# Patient Record
Sex: Male | Born: 1945
Health system: Southern US, Community
[De-identification: ages and names within clinical notes are randomized; demographics above are authoritative.]

## PROBLEM LIST (undated history)

## (undated) DIAGNOSIS — I739 Peripheral vascular disease, unspecified: Secondary | ICD-10-CM

## (undated) DIAGNOSIS — N200 Calculus of kidney: Secondary | ICD-10-CM

## (undated) DIAGNOSIS — F329 Major depressive disorder, single episode, unspecified: Secondary | ICD-10-CM

## (undated) DIAGNOSIS — E119 Type 2 diabetes mellitus without complications: Secondary | ICD-10-CM

## (undated) DIAGNOSIS — I10 Essential (primary) hypertension: Secondary | ICD-10-CM

## (undated) DIAGNOSIS — E291 Testicular hypofunction: Secondary | ICD-10-CM

## (undated) DIAGNOSIS — N529 Male erectile dysfunction, unspecified: Secondary | ICD-10-CM

## (undated) DIAGNOSIS — M791 Myalgia, unspecified site: Secondary | ICD-10-CM

## (undated) DIAGNOSIS — M1990 Unspecified osteoarthritis, unspecified site: Secondary | ICD-10-CM

## (undated) DIAGNOSIS — J449 Chronic obstructive pulmonary disease, unspecified: Secondary | ICD-10-CM

## (undated) DIAGNOSIS — I2510 Atherosclerotic heart disease of native coronary artery without angina pectoris: Secondary | ICD-10-CM

## (undated) DIAGNOSIS — R0602 Shortness of breath: Secondary | ICD-10-CM

## (undated) DIAGNOSIS — Z951 Presence of aortocoronary bypass graft: Secondary | ICD-10-CM

## (undated) DIAGNOSIS — N189 Chronic kidney disease, unspecified: Secondary | ICD-10-CM

## (undated) DIAGNOSIS — F32A Depression, unspecified: Secondary | ICD-10-CM

## (undated) DIAGNOSIS — N400 Enlarged prostate without lower urinary tract symptoms: Secondary | ICD-10-CM

## (undated) DIAGNOSIS — E785 Hyperlipidemia, unspecified: Secondary | ICD-10-CM

## (undated) DIAGNOSIS — Z87442 Personal history of urinary calculi: Secondary | ICD-10-CM

## (undated) DIAGNOSIS — R531 Weakness: Secondary | ICD-10-CM

## (undated) DIAGNOSIS — I480 Paroxysmal atrial fibrillation: Secondary | ICD-10-CM

## (undated) DIAGNOSIS — I7409 Other arterial embolism and thrombosis of abdominal aorta: Secondary | ICD-10-CM

## (undated) DIAGNOSIS — R0789 Other chest pain: Secondary | ICD-10-CM

## (undated) HISTORY — DX: Type 2 diabetes mellitus without complications: E11.9

## (undated) HISTORY — DX: Other arterial embolism and thrombosis of abdominal aorta: I74.09

## (undated) HISTORY — PX: CYSTECTOMY: SUR359

## (undated) HISTORY — DX: Atherosclerotic heart disease of native coronary artery without angina pectoris: I25.10

## (undated) HISTORY — PX: BLEPHAROPLASTY: SUR158

## (undated) HISTORY — DX: Weakness: R53.1

## (undated) HISTORY — DX: Paroxysmal atrial fibrillation: I48.0

## (undated) HISTORY — DX: Benign prostatic hyperplasia without lower urinary tract symptoms: N40.0

## (undated) HISTORY — DX: Major depressive disorder, single episode, unspecified: F32.9

## (undated) HISTORY — DX: Testicular hypofunction: E29.1

## (undated) HISTORY — DX: Personal history of urinary calculi: Z87.442

## (undated) HISTORY — DX: Male erectile dysfunction, unspecified: N52.9

## (undated) HISTORY — PX: LITHOTRIPSY: SUR834

## (undated) HISTORY — DX: Myalgia, unspecified site: M79.10

## (undated) HISTORY — DX: Presence of aortocoronary bypass graft: Z95.1

## (undated) HISTORY — DX: Essential (primary) hypertension: I10

## (undated) HISTORY — DX: Depression, unspecified: F32.A

## (undated) HISTORY — DX: Hyperlipidemia, unspecified: E78.5

## (undated) HISTORY — DX: Unspecified osteoarthritis, unspecified site: M19.90

## (undated) HISTORY — DX: Shortness of breath: R06.02

## (undated) HISTORY — DX: Peripheral vascular disease, unspecified: I73.9

---

## 1973-07-16 HISTORY — PX: VASECTOMY: SHX75

## 1990-07-16 HISTORY — PX: CARDIAC CATHETERIZATION: SHX172

## 1992-07-16 HISTORY — PX: VENTRAL HERNIA REPAIR: SHX424

## 1998-05-30 ENCOUNTER — Emergency Department (HOSPITAL_COMMUNITY): Admission: EM | Admit: 1998-05-30 | Discharge: 1998-05-30 | Payer: Self-pay | Admitting: Emergency Medicine

## 1998-06-06 ENCOUNTER — Encounter: Payer: Self-pay | Admitting: Internal Medicine

## 1998-06-06 ENCOUNTER — Ambulatory Visit (HOSPITAL_COMMUNITY): Admission: RE | Admit: 1998-06-06 | Discharge: 1998-06-06 | Payer: Self-pay | Admitting: Internal Medicine

## 1998-07-16 HISTORY — PX: CHOLECYSTECTOMY: SHX55

## 2007-03-23 ENCOUNTER — Emergency Department: Payer: Self-pay | Admitting: Emergency Medicine

## 2007-04-10 ENCOUNTER — Ambulatory Visit: Payer: Self-pay | Admitting: Urology

## 2007-11-11 ENCOUNTER — Ambulatory Visit: Payer: Self-pay | Admitting: Gastroenterology

## 2008-02-19 ENCOUNTER — Ambulatory Visit: Payer: Self-pay | Admitting: Internal Medicine

## 2008-05-27 DIAGNOSIS — I70209 Unspecified atherosclerosis of native arteries of extremities, unspecified extremity: Secondary | ICD-10-CM | POA: Insufficient documentation

## 2008-05-27 DIAGNOSIS — I2089 Other forms of angina pectoris: Secondary | ICD-10-CM | POA: Insufficient documentation

## 2008-05-27 DIAGNOSIS — I208 Other forms of angina pectoris: Secondary | ICD-10-CM | POA: Insufficient documentation

## 2008-12-02 ENCOUNTER — Ambulatory Visit: Payer: Self-pay | Admitting: Urology

## 2008-12-14 HISTORY — PX: OTHER SURGICAL HISTORY: SHX169

## 2009-01-03 ENCOUNTER — Encounter: Payer: Self-pay | Admitting: Cardiovascular Disease

## 2009-01-07 ENCOUNTER — Ambulatory Visit: Payer: Self-pay | Admitting: Vascular Surgery

## 2009-11-14 ENCOUNTER — Encounter: Payer: Self-pay | Admitting: Internal Medicine

## 2009-12-17 ENCOUNTER — Observation Stay (HOSPITAL_COMMUNITY): Admission: EM | Admit: 2009-12-17 | Discharge: 2009-12-18 | Payer: Self-pay | Admitting: Emergency Medicine

## 2009-12-19 ENCOUNTER — Telehealth: Payer: Self-pay | Admitting: Internal Medicine

## 2010-05-04 ENCOUNTER — Ambulatory Visit: Payer: Self-pay | Admitting: Cardiovascular Disease

## 2010-07-20 ENCOUNTER — Ambulatory Visit: Payer: Self-pay | Admitting: Family Medicine

## 2010-08-16 ENCOUNTER — Encounter: Payer: Self-pay | Admitting: Orthopedic Surgery

## 2010-08-17 NOTE — Letter (Signed)
Summary: Records from [facility] 2007 - 2011  Records from [facility] 2007 - 2011   Imported By: Maryln Gottron 01/05/2010 09:36:12  _____________________________________________________________________  External Attachment:    Type:   Image     Comment:   External Document

## 2010-08-17 NOTE — Progress Notes (Signed)
Summary: new pt?  Phone Note Call from Patient   Caller: Patient Call For: Dr.K Summary of Call: Pt is asking Dr Kirtland Bouchard to take him as a new pt.  Was seen by Dr. Kirtland Bouchard in the ER this weekend? [phone] Initial call taken by: Lynann Beaver CMA,  December 19, 2009 4:28 PM  Follow-up for Phone Call        OK Follow-up by: Gordy Savers  MD,  December 19, 2009 5:18 PM  Additional Follow-up for Phone Call Additional follow up Details #1::        pt will cb to sch Additional Follow-up by: Heron Sabins,  December 20, 2009 9:08 AM

## 2010-10-02 LAB — CARDIAC PANEL(CRET KIN+CKTOT+MB+TROPI)
CK, MB: 0.7 ng/mL (ref 0.3–4.0)
CK, MB: 0.7 ng/mL (ref 0.3–4.0)
Relative Index: INVALID (ref 0.0–2.5)
Relative Index: INVALID (ref 0.0–2.5)
Total CK: 31 U/L (ref 7–232)
Total CK: 32 U/L (ref 7–232)
Troponin I: <0.01 ng/mL (ref 0.00–0.06)
Troponin I: <0.01 ng/mL (ref 0.00–0.06)

## 2010-10-02 LAB — URINALYSIS, ROUTINE W REFLEX MICROSCOPIC
Bilirubin Urine: NEGATIVE
Glucose, UA: NEGATIVE mg/dL
Hgb urine dipstick: NEGATIVE
Ketones, ur: NEGATIVE mg/dL
Nitrite: NEGATIVE
Protein, ur: NEGATIVE mg/dL
Specific Gravity, Urine: 1.023 (ref 1.005–1.030)
Urobilinogen, UA: 0.2 mg/dL (ref 0.0–1.0)
pH: 5.5 (ref 5.0–8.0)

## 2010-10-02 LAB — COMPREHENSIVE METABOLIC PANEL
ALT: 23 U/L (ref 0–53)
AST: 23 U/L (ref 0–37)
Albumin: 3.4 g/dL — ABNORMAL LOW (ref 3.5–5.2)
Alkaline Phosphatase: 50 U/L (ref 39–117)
BUN: 20 mg/dL (ref 6–23)
CO2: 25 mEq/L (ref 19–32)
Calcium: 9.1 mg/dL (ref 8.4–10.5)
Chloride: 109 mEq/L (ref 96–112)
Creatinine, Ser: 1.02 mg/dL (ref 0.4–1.5)
GFR calc Af Amer: >60 mL/min (ref >60)
GFR calc non Af Amer: >60 mL/min (ref >60)
Glucose, Bld: 201 mg/dL — ABNORMAL HIGH (ref 70–99)
Potassium: 4.1 mEq/L (ref 3.5–5.1)
Sodium: 140 mEq/L (ref 135–145)
Total Bilirubin: 0.6 mg/dL (ref 0.3–1.2)
Total Protein: 6.2 g/dL (ref 6.0–8.3)

## 2010-10-02 LAB — DIFFERENTIAL
Basophils Absolute: 0 10*3/uL (ref 0.0–0.1)
Basophils Relative: 0 % (ref 0–1)
Eosinophils Absolute: 0.1 10*3/uL (ref 0.0–0.7)
Eosinophils Relative: 1 % (ref 0–5)
Lymphocytes Relative: 15 % (ref 12–46)
Lymphs Abs: 1.3 10*3/uL (ref 0.7–4.0)
Monocytes Absolute: 1.1 10*3/uL — ABNORMAL HIGH (ref 0.1–1.0)
Monocytes Relative: 13 % — ABNORMAL HIGH (ref 3–12)
Neutro Abs: 6 10*3/uL (ref 1.7–7.7)
Neutrophils Relative %: 71 % (ref 43–77)

## 2010-10-02 LAB — GLUCOSE, CAPILLARY
Glucose-Capillary: 111 mg/dL — ABNORMAL HIGH (ref 70–99)
Glucose-Capillary: 168 mg/dL — ABNORMAL HIGH (ref 70–99)
Glucose-Capillary: 196 mg/dL — ABNORMAL HIGH (ref 70–99)
Glucose-Capillary: 205 mg/dL — ABNORMAL HIGH (ref 70–99)

## 2010-10-02 LAB — CBC
HCT: 40.2 % (ref 39.0–52.0)
Hemoglobin: 14 g/dL (ref 13.0–17.0)
MCHC: 34.9 g/dL (ref 30.0–36.0)
MCV: 97.9 fL (ref 78.0–100.0)
Platelets: 164 10*3/uL (ref 150–400)
RBC: 4.1 MIL/uL — ABNORMAL LOW (ref 4.22–5.81)
RDW: 11.8 % (ref 11.5–15.5)
WBC: 8.4 10*3/uL (ref 4.0–10.5)

## 2010-10-02 LAB — TROPONIN I: Troponin I: 0.02 ng/mL (ref 0.00–0.06)

## 2010-10-02 LAB — CK TOTAL AND CKMB (NOT AT ARMC)
CK, MB: 0.7 ng/mL (ref 0.3–4.0)
Relative Index: INVALID (ref 0.0–2.5)
Total CK: 33 U/L (ref 7–232)

## 2010-10-02 LAB — HEMOGLOBIN A1C
Hgb A1c MFr Bld: 6.2 % — ABNORMAL HIGH (ref <5.7)
Mean Plasma Glucose: 131 mg/dL — ABNORMAL HIGH (ref <117)

## 2010-10-02 LAB — TSH: TSH: 1.441 u[IU]/mL (ref 0.350–4.500)

## 2010-12-01 ENCOUNTER — Other Ambulatory Visit: Payer: Self-pay | Admitting: Cardiovascular Disease

## 2010-12-01 MED ORDER — ROSUVASTATIN CALCIUM 5 MG PO TABS: 5.0000 mg | ORAL_TABLET | Freq: Every day | ORAL | Status: DC

## 2010-12-01 MED ORDER — METOPROLOL SUCCINATE ER 25 MG PO TB24: 25.0000 mg | ORAL_TABLET | Freq: Every day | ORAL | Status: DC

## 2010-12-01 NOTE — Telephone Encounter (Signed)
Patient request refill. Jodette Hulet Ehrmann RN  

## 2010-12-01 NOTE — Telephone Encounter (Signed)
PATIENT IS MOVING TO [place] THE END OF June AND  NEEDS SCRIPTS CALLED [facility]-  METOPROLOL- 25MG  AND CRESTOR 5MG .

## 2011-01-26 ENCOUNTER — Telehealth: Payer: Self-pay | Admitting: Cardiovascular Disease

## 2011-01-26 NOTE — Telephone Encounter (Signed)
PT SAID NEW CARDIOLOGY OFFICE DID NOT RECV HIS RECORDS AS REQUESTED. ALTHOUGH THIS WAS DONE, ASSURED PT WE CAN REFAX AND HE GAVE A DIFFERENT FAX NUMBER OF [phone]. ALSO ASK FOR PHONE CALL BACK TO LET HIM KNOW WHEN THIS HAS BEEN DONE.

## 2011-01-26 NOTE — Telephone Encounter (Signed)
RECORDS TO DR GOODROE IN [place] AS REQUESTED BY PATIENT

## 2012-04-17 ENCOUNTER — Encounter: Payer: Self-pay | Admitting: Cardiovascular Disease

## 2012-12-16 LAB — HM COLONOSCOPY: HM Colonoscopy: NORMAL

## 2013-07-27 DIAGNOSIS — E291 Testicular hypofunction: Secondary | ICD-10-CM | POA: Diagnosis not present

## 2013-07-27 DIAGNOSIS — N200 Calculus of kidney: Secondary | ICD-10-CM | POA: Diagnosis not present

## 2013-07-27 DIAGNOSIS — Z5181 Encounter for therapeutic drug level monitoring: Secondary | ICD-10-CM | POA: Diagnosis not present

## 2013-08-14 DIAGNOSIS — H04129 Dry eye syndrome of unspecified lacrimal gland: Secondary | ICD-10-CM | POA: Diagnosis not present

## 2013-08-14 DIAGNOSIS — H259 Unspecified age-related cataract: Secondary | ICD-10-CM | POA: Diagnosis not present

## 2013-08-14 DIAGNOSIS — E119 Type 2 diabetes mellitus without complications: Secondary | ICD-10-CM | POA: Diagnosis not present

## 2013-08-18 DIAGNOSIS — E291 Testicular hypofunction: Secondary | ICD-10-CM | POA: Diagnosis not present

## 2013-09-08 DIAGNOSIS — E291 Testicular hypofunction: Secondary | ICD-10-CM | POA: Diagnosis not present

## 2013-09-09 DIAGNOSIS — M545 Low back pain, unspecified: Secondary | ICD-10-CM | POA: Diagnosis not present

## 2013-09-09 DIAGNOSIS — M5430 Sciatica, unspecified side: Secondary | ICD-10-CM | POA: Diagnosis not present

## 2013-09-29 DIAGNOSIS — E782 Mixed hyperlipidemia: Secondary | ICD-10-CM | POA: Diagnosis not present

## 2013-09-29 DIAGNOSIS — I10 Essential (primary) hypertension: Secondary | ICD-10-CM | POA: Diagnosis not present

## 2013-09-29 DIAGNOSIS — E291 Testicular hypofunction: Secondary | ICD-10-CM | POA: Diagnosis not present

## 2013-10-06 DIAGNOSIS — M25529 Pain in unspecified elbow: Secondary | ICD-10-CM | POA: Diagnosis not present

## 2013-10-06 DIAGNOSIS — E782 Mixed hyperlipidemia: Secondary | ICD-10-CM | POA: Diagnosis not present

## 2013-10-06 DIAGNOSIS — I10 Essential (primary) hypertension: Secondary | ICD-10-CM | POA: Diagnosis not present

## 2013-10-27 DIAGNOSIS — E291 Testicular hypofunction: Secondary | ICD-10-CM | POA: Diagnosis not present

## 2013-11-17 DIAGNOSIS — E291 Testicular hypofunction: Secondary | ICD-10-CM | POA: Diagnosis not present

## 2013-12-01 DIAGNOSIS — R002 Palpitations: Secondary | ICD-10-CM | POA: Diagnosis not present

## 2013-12-01 DIAGNOSIS — E785 Hyperlipidemia, unspecified: Secondary | ICD-10-CM | POA: Diagnosis not present

## 2013-12-01 DIAGNOSIS — I2510 Atherosclerotic heart disease of native coronary artery without angina pectoris: Secondary | ICD-10-CM | POA: Diagnosis not present

## 2013-12-01 DIAGNOSIS — I739 Peripheral vascular disease, unspecified: Secondary | ICD-10-CM | POA: Diagnosis not present

## 2013-12-01 DIAGNOSIS — I10 Essential (primary) hypertension: Secondary | ICD-10-CM | POA: Diagnosis not present

## 2013-12-08 DIAGNOSIS — E291 Testicular hypofunction: Secondary | ICD-10-CM | POA: Diagnosis not present

## 2013-12-14 DIAGNOSIS — E782 Mixed hyperlipidemia: Secondary | ICD-10-CM | POA: Diagnosis not present

## 2013-12-14 DIAGNOSIS — I10 Essential (primary) hypertension: Secondary | ICD-10-CM | POA: Diagnosis not present

## 2013-12-15 DIAGNOSIS — N200 Calculus of kidney: Secondary | ICD-10-CM | POA: Diagnosis not present

## 2013-12-15 DIAGNOSIS — M545 Low back pain, unspecified: Secondary | ICD-10-CM | POA: Diagnosis not present

## 2013-12-15 DIAGNOSIS — M5430 Sciatica, unspecified side: Secondary | ICD-10-CM | POA: Diagnosis not present

## 2013-12-16 DIAGNOSIS — N200 Calculus of kidney: Secondary | ICD-10-CM | POA: Diagnosis not present

## 2013-12-16 DIAGNOSIS — I700 Atherosclerosis of aorta: Secondary | ICD-10-CM | POA: Diagnosis not present

## 2013-12-16 DIAGNOSIS — M545 Low back pain, unspecified: Secondary | ICD-10-CM | POA: Diagnosis not present

## 2013-12-16 DIAGNOSIS — M47817 Spondylosis without myelopathy or radiculopathy, lumbosacral region: Secondary | ICD-10-CM | POA: Diagnosis not present

## 2013-12-16 DIAGNOSIS — M533 Sacrococcygeal disorders, not elsewhere classified: Secondary | ICD-10-CM | POA: Diagnosis not present

## 2013-12-16 DIAGNOSIS — M5137 Other intervertebral disc degeneration, lumbosacral region: Secondary | ICD-10-CM | POA: Diagnosis not present

## 2013-12-29 DIAGNOSIS — E291 Testicular hypofunction: Secondary | ICD-10-CM | POA: Diagnosis not present

## 2014-01-05 DIAGNOSIS — E291 Testicular hypofunction: Secondary | ICD-10-CM | POA: Diagnosis not present

## 2014-01-05 DIAGNOSIS — Z79899 Other long term (current) drug therapy: Secondary | ICD-10-CM | POA: Diagnosis not present

## 2014-01-05 DIAGNOSIS — R3129 Other microscopic hematuria: Secondary | ICD-10-CM | POA: Diagnosis not present

## 2014-01-21 DIAGNOSIS — M4716 Other spondylosis with myelopathy, lumbar region: Secondary | ICD-10-CM | POA: Diagnosis not present

## 2014-01-21 DIAGNOSIS — M5430 Sciatica, unspecified side: Secondary | ICD-10-CM | POA: Diagnosis not present

## 2014-01-26 DIAGNOSIS — M5126 Other intervertebral disc displacement, lumbar region: Secondary | ICD-10-CM | POA: Diagnosis not present

## 2014-01-26 DIAGNOSIS — M47817 Spondylosis without myelopathy or radiculopathy, lumbosacral region: Secondary | ICD-10-CM | POA: Diagnosis not present

## 2014-01-26 DIAGNOSIS — M48061 Spinal stenosis, lumbar region without neurogenic claudication: Secondary | ICD-10-CM | POA: Diagnosis not present

## 2014-01-27 DIAGNOSIS — E291 Testicular hypofunction: Secondary | ICD-10-CM | POA: Diagnosis not present

## 2014-01-27 DIAGNOSIS — N138 Other obstructive and reflux uropathy: Secondary | ICD-10-CM | POA: Diagnosis not present

## 2014-01-27 DIAGNOSIS — N401 Enlarged prostate with lower urinary tract symptoms: Secondary | ICD-10-CM | POA: Diagnosis not present

## 2014-01-27 DIAGNOSIS — Z87442 Personal history of urinary calculi: Secondary | ICD-10-CM | POA: Diagnosis not present

## 2014-02-12 DIAGNOSIS — H6120 Impacted cerumen, unspecified ear: Secondary | ICD-10-CM | POA: Diagnosis not present

## 2014-02-12 DIAGNOSIS — J029 Acute pharyngitis, unspecified: Secondary | ICD-10-CM | POA: Diagnosis not present

## 2014-02-12 DIAGNOSIS — H6690 Otitis media, unspecified, unspecified ear: Secondary | ICD-10-CM | POA: Diagnosis not present

## 2014-02-15 DIAGNOSIS — M48061 Spinal stenosis, lumbar region without neurogenic claudication: Secondary | ICD-10-CM | POA: Diagnosis not present

## 2014-02-15 DIAGNOSIS — M47817 Spondylosis without myelopathy or radiculopathy, lumbosacral region: Secondary | ICD-10-CM | POA: Diagnosis not present

## 2014-02-16 DIAGNOSIS — E291 Testicular hypofunction: Secondary | ICD-10-CM | POA: Diagnosis not present

## 2014-03-08 DIAGNOSIS — J069 Acute upper respiratory infection, unspecified: Secondary | ICD-10-CM | POA: Diagnosis not present

## 2014-03-08 DIAGNOSIS — R079 Chest pain, unspecified: Secondary | ICD-10-CM | POA: Diagnosis not present

## 2014-03-08 DIAGNOSIS — R0789 Other chest pain: Secondary | ICD-10-CM | POA: Diagnosis not present

## 2014-03-08 DIAGNOSIS — M25529 Pain in unspecified elbow: Secondary | ICD-10-CM | POA: Diagnosis not present

## 2014-03-09 DIAGNOSIS — E291 Testicular hypofunction: Secondary | ICD-10-CM | POA: Diagnosis not present

## 2014-04-13 DIAGNOSIS — R002 Palpitations: Secondary | ICD-10-CM | POA: Diagnosis not present

## 2014-04-13 DIAGNOSIS — E291 Testicular hypofunction: Secondary | ICD-10-CM | POA: Diagnosis not present

## 2014-04-13 DIAGNOSIS — I10 Essential (primary) hypertension: Secondary | ICD-10-CM | POA: Diagnosis not present

## 2014-04-13 DIAGNOSIS — I739 Peripheral vascular disease, unspecified: Secondary | ICD-10-CM | POA: Diagnosis not present

## 2014-04-13 DIAGNOSIS — E785 Hyperlipidemia, unspecified: Secondary | ICD-10-CM | POA: Diagnosis not present

## 2014-04-13 DIAGNOSIS — I2510 Atherosclerotic heart disease of native coronary artery without angina pectoris: Secondary | ICD-10-CM | POA: Diagnosis not present

## 2014-05-05 DIAGNOSIS — R351 Nocturia: Secondary | ICD-10-CM | POA: Diagnosis not present

## 2014-05-05 DIAGNOSIS — N401 Enlarged prostate with lower urinary tract symptoms: Secondary | ICD-10-CM | POA: Diagnosis not present

## 2014-05-06 DIAGNOSIS — I10 Essential (primary) hypertension: Secondary | ICD-10-CM | POA: Diagnosis not present

## 2014-05-06 DIAGNOSIS — E782 Mixed hyperlipidemia: Secondary | ICD-10-CM | POA: Diagnosis not present

## 2014-05-13 DIAGNOSIS — N400 Enlarged prostate without lower urinary tract symptoms: Secondary | ICD-10-CM | POA: Diagnosis not present

## 2014-05-13 DIAGNOSIS — E782 Mixed hyperlipidemia: Secondary | ICD-10-CM | POA: Diagnosis not present

## 2014-05-13 DIAGNOSIS — M1990 Unspecified osteoarthritis, unspecified site: Secondary | ICD-10-CM | POA: Diagnosis not present

## 2014-05-13 DIAGNOSIS — I10 Essential (primary) hypertension: Secondary | ICD-10-CM | POA: Diagnosis not present

## 2014-07-05 DIAGNOSIS — N3281 Overactive bladder: Secondary | ICD-10-CM | POA: Diagnosis not present

## 2014-07-05 DIAGNOSIS — N401 Enlarged prostate with lower urinary tract symptoms: Secondary | ICD-10-CM | POA: Diagnosis not present

## 2014-07-27 DIAGNOSIS — B354 Tinea corporis: Secondary | ICD-10-CM | POA: Diagnosis not present

## 2014-07-27 DIAGNOSIS — L723 Sebaceous cyst: Secondary | ICD-10-CM | POA: Diagnosis not present

## 2014-08-05 DIAGNOSIS — L723 Sebaceous cyst: Secondary | ICD-10-CM | POA: Diagnosis not present

## 2014-08-05 DIAGNOSIS — N3281 Overactive bladder: Secondary | ICD-10-CM | POA: Diagnosis not present

## 2014-08-05 DIAGNOSIS — N401 Enlarged prostate with lower urinary tract symptoms: Secondary | ICD-10-CM | POA: Diagnosis not present

## 2014-08-19 DIAGNOSIS — Z125 Encounter for screening for malignant neoplasm of prostate: Secondary | ICD-10-CM | POA: Diagnosis not present

## 2014-08-19 DIAGNOSIS — E782 Mixed hyperlipidemia: Secondary | ICD-10-CM | POA: Diagnosis not present

## 2014-08-24 DIAGNOSIS — R51 Headache: Secondary | ICD-10-CM | POA: Diagnosis not present

## 2014-08-24 DIAGNOSIS — H6980 Other specified disorders of Eustachian tube, unspecified ear: Secondary | ICD-10-CM | POA: Diagnosis not present

## 2014-08-24 DIAGNOSIS — J019 Acute sinusitis, unspecified: Secondary | ICD-10-CM | POA: Diagnosis not present

## 2014-09-08 DIAGNOSIS — L723 Sebaceous cyst: Secondary | ICD-10-CM | POA: Diagnosis not present

## 2014-09-08 DIAGNOSIS — N3281 Overactive bladder: Secondary | ICD-10-CM | POA: Diagnosis not present

## 2014-09-08 DIAGNOSIS — N401 Enlarged prostate with lower urinary tract symptoms: Secondary | ICD-10-CM | POA: Diagnosis not present

## 2014-09-27 ENCOUNTER — Telehealth: Payer: Self-pay | Admitting: Internal Medicine

## 2014-09-27 NOTE — Telephone Encounter (Signed)
Pt called stating is he moving back to [place] and wanted to know if you would take him back as a patient Medicare humana supplement

## 2014-09-27 NOTE — Telephone Encounter (Signed)
That is fine 

## 2014-11-15 ENCOUNTER — Ambulatory Visit: Payer: Self-pay | Admitting: Internal Medicine

## 2014-12-01 ENCOUNTER — Encounter: Payer: Self-pay | Admitting: *Deleted

## 2014-12-02 ENCOUNTER — Ambulatory Visit (INDEPENDENT_AMBULATORY_CARE_PROVIDER_SITE_OTHER): Payer: Medicare Other | Admitting: Cardiovascular Disease

## 2014-12-02 ENCOUNTER — Encounter: Payer: Self-pay | Admitting: Cardiovascular Disease

## 2014-12-02 ENCOUNTER — Encounter (INDEPENDENT_AMBULATORY_CARE_PROVIDER_SITE_OTHER): Payer: Self-pay

## 2014-12-02 VITALS — BP 120/64 | HR 68 | Ht 68.0 in | Wt 201.8 lb

## 2014-12-02 DIAGNOSIS — I2510 Atherosclerotic heart disease of native coronary artery without angina pectoris: Secondary | ICD-10-CM | POA: Diagnosis not present

## 2014-12-02 DIAGNOSIS — I700 Atherosclerosis of aorta: Secondary | ICD-10-CM

## 2014-12-02 DIAGNOSIS — I7410 Embolism and thrombosis of unspecified parts of aorta: Secondary | ICD-10-CM

## 2014-12-02 DIAGNOSIS — I10 Essential (primary) hypertension: Secondary | ICD-10-CM

## 2014-12-02 DIAGNOSIS — Q253 Supravalvular aortic stenosis: Secondary | ICD-10-CM | POA: Diagnosis not present

## 2014-12-02 DIAGNOSIS — I739 Peripheral vascular disease, unspecified: Secondary | ICD-10-CM

## 2014-12-02 DIAGNOSIS — R0602 Shortness of breath: Secondary | ICD-10-CM | POA: Diagnosis not present

## 2014-12-02 NOTE — Patient Instructions (Signed)
Medication Instructions:  No changes  Labwork: None  Testing/Procedures: None  Follow-Up: Your physician wants you to follow-up in: 1 year  You will receive a reminder letter in the mail two months in advance.  If you don't receive a letter, please call our office to schedule the follow-up appointment.   Any Other Special Instructions Will Be Listed Below (If Applicable). A referral has been sent to Dr. Hart RochesterLawson at [facility] They will contact you with an appointment

## 2014-12-02 NOTE — Progress Notes (Signed)
Cardiology Office Note   Date:  12/02/2014   ID:  [name], DOB 09-18-1945, MRN [id number]  PCP:  [name]  Cardiologist:   [name]   Chief Complaint  Patient presents with  . other    Self ref to establish care for PVD & CAD; was seen in the past in [place] office, [place] back [place] [facility]. Meds reviewed by the patient's medication list. Pt. c/o difficulty walking a long distance.    Problem List: 1. CAD -had a cath in the remote past 2.  Peripheral Arterial Disease -  Reportedly has an occluded distal aorta , has seen Dr. Jerilee FieldJ.D. [name] in the past   3. Essential Hypertensioni 4. Hyperlipidemia 5. Borderline  DM 6. ED 7. BPH      History of Present Illness: [name] is a 69 y.o. male who presents for follow-up of his coronary artery disease and peripheral vascular disease. I have Seen [name]  approximately 5 years ago. He's recently moved back to the [place] area and wishes to reestablish care.  Is [name] former neighbor.   [place] [facility] [place], now is back. Reportedly has an occluded distal aorta.  Has some DOE with walking. Legs feel cold  Had a cath in the remote past, reportedly has  Minimal CAD   Past Medical History  Diagnosis Date  . Coronary artery disease   . Peripheral vascular disease   . Hyperlipidemia   . Diabetes mellitus   . SOB (shortness of breath)   . Muscle ache   . Muscle pain   . Ischemic leg   . Weakness   . Erectile dysfunction   . Hypertension     Past Surgical History  Procedure Laterality Date  . Cardiac catheterization  1992     Current Outpatient Prescriptions  Medication Sig Dispense Refill  . acetaminophen (TYLENOL) 650 MG CR tablet Take 650 mg by mouth every 8 (eight) hours as needed.    . clopidogrel (PLAVIX) 75 MG tablet Take 75 mg by mouth daily.    . finasteride (PROSCAR) 5 MG tablet Take 5 mg by mouth daily.    Marland Kitchen. gabapentin (NEURONTIN) 400 MG  capsule Take 400 mg by mouth 3 (three) times daily.    . metoprolol succinate (TOPROL XL) 25 MG 24 hr tablet Take 1 tablet (25 mg total) by mouth daily. 30 tablet 11  . mirabegron ER (MYRBETRIQ) 25 MG TB24 tablet Take 25 mg by mouth daily.    . Multiple Vitamin (MULTIVITAMIN) tablet Take 1 tablet by mouth daily.    . naproxen sodium (ANAPROX) 220 MG tablet Take 220 mg by mouth 2 (two) times daily with a meal.    . simvastatin (ZOCOR) 20 MG tablet Take 20 mg by mouth daily.     No current facility-administered medications for this visit.    Allergies:   Fenofibrate and Lipitor    Social History:  The patient  reports that he quit smoking about 22 years ago. His smoking use included Cigarettes. He has a 80 pack-year smoking history. He does not have any smokeless tobacco history on file. He reports that he does not drink alcohol or use illicit drugs.   Family History:  The patient's family history includes Coronary artery disease in his father.    ROS:  Please see the history of present illness.    Review of Systems: Constitutional:  denies fever, chills, diaphoresis, appetite change and  fatigue.  HEENT: denies photophobia, eye pain, redness, hearing loss, ear pain, congestion, sore throat, rhinorrhea, sneezing, neck pain, neck stiffness and tinnitus.  Respiratory: denies SOB, DOE, cough, chest tightness, and wheezing.  Cardiovascular: denies chest pain, palpitations and leg swelling.  Gastrointestinal: denies nausea, vomiting, abdominal pain, diarrhea, constipation, blood in stool.  Genitourinary: denies dysuria, urgency, frequency, hematuria, flank pain and difficulty urinating.  Musculoskeletal: denies  myalgias, back pain, joint swelling, arthralgias and gait problem.   Skin: denies pallor, rash and wound.  Neurological: denies dizziness, seizures, syncope, weakness, light-headedness, numbness and headaches.   Hematological: denies adenopathy, easy bruising, personal or family  bleeding history.  Psychiatric/ Behavioral: denies suicidal ideation, mood changes, confusion, nervousness, sleep disturbance and agitation.       All other systems are reviewed and negative.    PHYSICAL EXAM: VS:  BP 120/64 mmHg  Pulse 68  Ht 5\' 8"  (1.727 m)  Wt 91.513 kg (201 lb 12 oz)  BMI 30.68 kg/m2 , BMI Body mass index is 30.68 kg/(m^2). GEN: Well nourished, well developed, in no acute distress HEENT: normal Neck: no JVD, carotid bruits, or masses Cardiac: RRR; no murmurs, rubs, or gallops,no edema , his distal leg pulses are not palpable  Respiratory:  clear to auscultation bilaterally, normal work of breathing GI: soft, nontender, nondistended, + BS, + abdominal bruit.  MS: no deformity or atrophy Skin: warm and dry, no rash Neuro:  Strength and sensation are intact Psych: normal   EKG:  EKG is ordered today. The ekg ordered today demonstrates :  NSR at 68.  Short PR. Otherwise normal .    Recent Labs: No results found for requested labs within last 365 days.    Lipid Panel No results found for: CHOL, TRIG, HDL, CHOLHDL, VLDL, LDLCALC, LDLDIRECT    Wt Readings from Last 3 Encounters:  12/02/14 91.513 kg (201 lb 12 oz)      Other studies Reviewed: Additional studies/ records that were reviewed today include: . Review of the above records demonstrates:    ASSESSMENT AND PLAN:  1. CAD -had a cath in the remote past, has minimal coronary artery disease by his report. He's not having any angina. He had a negative stress test in [place]  [facility] in [place]. Continue current medications.  2.  Peripheral Arterial Disease -  Reportedly has an occluded distal aorta , has seen Dr. Jerilee FieldJ.D. [name] in the past .  We will will refer him back to [facility] for further evaluation and duplex scanning. 3. Essential Hypertension - BP is well controlled.  4. Hyperlipidemia - he wants to have this managed by Dr. Dan HumphreysWalker. Continue simvastatin. 5. Borderline  DM 6. ED 7. BPH   Current  medicines are reviewed at length with the patient today.  The patient does not have concerns regarding medicines.  The following changes have been made:  no change  Labs/ tests ordered today include:   Orders Placed This Encounter  Procedures  . EKG 12-Lead     Disposition:   FU with me in 1 year      Shanard Treto, Deloris PingPhilip J, [name]  12/02/2014 9:09 AM    [facility] [address], [place], [place]  [address] Phone: [phone]) [phone]; Fax: [phone]   [facility]  [address] [place], [place]  [address] [phone]    Fax [phone]

## 2014-12-07 ENCOUNTER — Other Ambulatory Visit: Payer: Self-pay

## 2014-12-07 ENCOUNTER — Other Ambulatory Visit: Payer: Self-pay | Admitting: *Deleted

## 2014-12-07 MED ORDER — CLOPIDOGREL BISULFATE 75 MG PO TABS: 75.0000 mg | ORAL_TABLET | Freq: Every day | ORAL | Status: DC

## 2014-12-07 MED ORDER — METOPROLOL SUCCINATE ER 25 MG PO TB24: 25.0000 mg | ORAL_TABLET | Freq: Every day | ORAL | Status: DC

## 2014-12-07 NOTE — Telephone Encounter (Signed)
Refill sent for metoprolol.  

## 2014-12-10 ENCOUNTER — Other Ambulatory Visit: Payer: Self-pay

## 2014-12-10 MED ORDER — METOPROLOL SUCCINATE ER 25 MG PO TB24: 25.0000 mg | ORAL_TABLET | Freq: Every day | ORAL | Status: DC

## 2014-12-10 NOTE — Telephone Encounter (Signed)
Refill sent for metoprolol succ 25 mg  

## 2014-12-17 ENCOUNTER — Encounter: Payer: Self-pay | Admitting: Internal Medicine

## 2014-12-17 ENCOUNTER — Ambulatory Visit (INDEPENDENT_AMBULATORY_CARE_PROVIDER_SITE_OTHER): Payer: Medicare Other | Admitting: Internal Medicine

## 2014-12-17 VITALS — BP 93/58 | HR 85 | Temp 97.9°F | Ht 68.25 in | Wt 203.0 lb

## 2014-12-17 DIAGNOSIS — N400 Enlarged prostate without lower urinary tract symptoms: Secondary | ICD-10-CM

## 2014-12-17 DIAGNOSIS — L299 Pruritus, unspecified: Secondary | ICD-10-CM | POA: Diagnosis not present

## 2014-12-17 DIAGNOSIS — I700 Atherosclerosis of aorta: Secondary | ICD-10-CM | POA: Insufficient documentation

## 2014-12-17 DIAGNOSIS — R413 Other amnesia: Secondary | ICD-10-CM | POA: Diagnosis not present

## 2014-12-17 DIAGNOSIS — I739 Peripheral vascular disease, unspecified: Secondary | ICD-10-CM

## 2014-12-17 DIAGNOSIS — M545 Low back pain, unspecified: Secondary | ICD-10-CM | POA: Insufficient documentation

## 2014-12-17 DIAGNOSIS — I10 Essential (primary) hypertension: Secondary | ICD-10-CM

## 2014-12-17 DIAGNOSIS — Z23 Encounter for immunization: Secondary | ICD-10-CM | POA: Insufficient documentation

## 2014-12-17 DIAGNOSIS — I2510 Atherosclerotic heart disease of native coronary artery without angina pectoris: Secondary | ICD-10-CM | POA: Diagnosis not present

## 2014-12-17 LAB — COMPREHENSIVE METABOLIC PANEL
ALT: 20 U/L (ref 0–53)
AST: 16 U/L (ref 0–37)
Albumin: 3.9 g/dL (ref 3.5–5.2)
Alkaline Phosphatase: 49 U/L (ref 39–117)
BUN: 24 mg/dL — ABNORMAL HIGH (ref 6–23)
CO2: 27 mEq/L (ref 19–32)
Calcium: 9.2 mg/dL (ref 8.4–10.5)
Chloride: 103 mEq/L (ref 96–112)
Creatinine, Ser: 1.08 mg/dL (ref 0.40–1.50)
GFR: 71.75 mL/min (ref >60.00)
Glucose, Bld: 183 mg/dL — ABNORMAL HIGH (ref 70–99)
Potassium: 4 mEq/L (ref 3.5–5.1)
Sodium: 136 mEq/L (ref 135–145)
Total Bilirubin: 0.4 mg/dL (ref 0.2–1.2)
Total Protein: 6.6 g/dL (ref 6.0–8.3)

## 2014-12-17 LAB — TSH: TSH: 1.84 u[IU]/mL (ref 0.35–4.50)

## 2014-12-17 LAB — VITAMIN B12: Vitamin B-12: 507 pg/mL (ref 211–911)

## 2014-12-17 MED ORDER — SIMVASTATIN 20 MG PO TABS: 20.0000 mg | ORAL_TABLET | Freq: Every day | ORAL | Status: DC

## 2014-12-17 MED ORDER — GABAPENTIN 400 MG PO CAPS: 400.0000 mg | ORAL_CAPSULE | Freq: Three times a day (TID) | ORAL | Status: DC

## 2014-12-17 MED ORDER — METOPROLOL SUCCINATE ER 25 MG PO TB24: 25.0000 mg | ORAL_TABLET | Freq: Every day | ORAL | Status: DC

## 2014-12-17 MED ORDER — FINASTERIDE 5 MG PO TABS: 5.0000 mg | ORAL_TABLET | Freq: Every day | ORAL | Status: DC

## 2014-12-17 MED ORDER — CLOPIDOGREL BISULFATE 75 MG PO TABS: 75.0000 mg | ORAL_TABLET | Freq: Every day | ORAL | Status: DC

## 2014-12-17 NOTE — Assessment & Plan Note (Signed)
Urology evaluation pending. Stopping Myrbetriq because of pruritis.

## 2014-12-17 NOTE — Assessment & Plan Note (Signed)
BP Readings from Last 3 Encounters:  12/17/14 93/58  12/02/14 120/64   BP well controlled. Will check renal function with labs.

## 2014-12-17 NOTE — Assessment & Plan Note (Signed)
Recent pruritis after starting Myrbetriq. Recommended stopping medication and discussing with Urology at follow up scheduled.

## 2014-12-17 NOTE — Assessment & Plan Note (Signed)
Some dyspnea and left arm pain with exertion. Recent evaluation with Dr. Elease HashimotoNahser. Will request previous stress test. Continue statin, metoprolol, plavix.

## 2014-12-17 NOTE — Assessment & Plan Note (Signed)
Chronic lumbago. Will request MRI lumbar spine report. Continue prn Aleve. Will set up PT for evaluation.

## 2014-12-17 NOTE — Patient Instructions (Signed)
Labs today.  We will set up physical therapy.  Follow up 3 months.

## 2014-12-17 NOTE — Assessment & Plan Note (Signed)
Vascular evaluation pending. Continue statin, Plavix. 

## 2014-12-17 NOTE — Assessment & Plan Note (Signed)
Vascular evaluation pending. Continue statin, Plavix.

## 2014-12-17 NOTE — Progress Notes (Signed)
Subjective:    Patient ID: [name], male    DOB: [birth year], 69 y.o.   MRN: [id number]  HPI  68YO male presents to establish care.  Itching - Over back of head, legs, and ears. Ongoing for a few months. Started when he started [employer].  BPH - Changed from [place] to Myrbetriq. Has frequent urination some nights, up to 2-3 times per night. Some nights, sleeps through whole night. Avoids caffeine.   Dyspnea - With exertion and at rest.  Associated with left upper arm pain. No diaphoresis. No chest pain. Had a recent Holter monitor in [facility]. Also had stress test 2 years ago which was normal. Recent evaluation with Dr. Elease Hashimoto.  Low back pain - Chronic for years. MRI  Lumbar spine was normal per report. Sharp pain, mostly with walking. Aleve with minimal improvement. No previous PT.   Past medical, surgical, family and social history per today's encounter.  Review of Systems  Constitutional: Negative for fever, chills, activity change, appetite change, fatigue and unexpected weight change.  Eyes: Negative for visual disturbance.  Respiratory: Positive for shortness of breath. Negative for cough.   Cardiovascular: Negative for chest pain, palpitations and leg swelling.  Gastrointestinal: Negative for nausea, vomiting, abdominal pain, diarrhea, constipation and abdominal distention.  Genitourinary: Negative for dysuria, urgency, frequency, hematuria, decreased urine volume and difficulty urinating.  Musculoskeletal: Positive for myalgias, back pain and arthralgias. Negative for gait problem.  Skin: Negative for color change and rash.  Hematological: Negative for adenopathy.  Psychiatric/Behavioral: Negative for suicidal ideas, sleep disturbance and dysphoric mood. The patient is not nervous/anxious.        Objective:    BP 93/58 mmHg  Pulse 85  Temp(Src) 97.9 F (36.6 C) (Oral)  Ht 5' 8.25" (1.734 m)  Wt 203 lb (92.08 kg)  BMI 30.62 kg/m2  SpO2 95% Physical Exam    Constitutional: He is oriented to person, place, and time. He appears well-developed and well-nourished. No distress.  HENT:  Head: Normocephalic and atraumatic.  Right Ear: External ear normal.  Left Ear: External ear normal.  Nose: Nose normal.  Mouth/Throat: Oropharynx is clear and moist. No oropharyngeal exudate.  Eyes: Conjunctivae and EOM are normal. Pupils are equal, round, and reactive to light. Right eye exhibits no discharge. Left eye exhibits no discharge. No scleral icterus.  Neck: Normal range of motion. Neck supple. No tracheal deviation present. No thyromegaly present.  Cardiovascular: Normal rate, regular rhythm and normal heart sounds.  Exam reveals no gallop and no friction rub.   No murmur heard. Pulmonary/Chest: Effort normal and breath sounds normal. No respiratory distress. He has no wheezes. He has no rales. He exhibits no tenderness.  Abdominal: Soft. Bowel sounds are normal. He exhibits no distension and no mass. There is no tenderness. There is no rebound and no guarding.  Musculoskeletal: Normal range of motion. He exhibits no edema.  Lymphadenopathy:    He has no cervical adenopathy.  Neurological: He is alert and oriented to person, place, and time. No cranial nerve deficit. Coordination normal.  Skin: Skin is warm and dry. No rash noted. He is not diaphoretic. No erythema. No pallor.  Psychiatric: He has a normal mood and affect. His behavior is normal. Judgment and thought content normal.          Assessment & Plan:   Problem List Items Addressed This Visit      Unprioritized   Aortic atherosclerosis    Vascular evaluation pending. Continue statin, Plavix.  Relevant Medications   metoprolol succinate (TOPROL XL) 25 MG 24 hr tablet   simvastatin (ZOCOR) 20 MG tablet   BPH (benign prostatic hypertrophy) - Primary    Urology evaluation pending. Stopping Myrbetriq because of pruritis.      Relevant Medications   finasteride (PROSCAR) 5 MG  tablet   Other Relevant Orders   Ambulatory referral to Urology   CAD (coronary artery disease)    Some dyspnea and left arm pain with exertion. Recent evaluation with Dr. Elease HashimotoNahser. Will request previous stress test. Continue statin, metoprolol, plavix.      Relevant Medications   metoprolol succinate (TOPROL XL) 25 MG 24 hr tablet   simvastatin (ZOCOR) 20 MG tablet   HTN (hypertension)    BP Readings from Last 3 Encounters:  12/17/14 93/58  12/02/14 120/64   BP well controlled. Will check renal function with labs.      Relevant Medications   metoprolol succinate (TOPROL XL) 25 MG 24 hr tablet   simvastatin (ZOCOR) 20 MG tablet   Lumbago    Chronic lumbago. Will request MRI lumbar spine report. Continue prn Aleve. Will set up PT for evaluation.      Relevant Orders   Ambulatory referral to Physical Therapy   Memory loss    Pt notes mild short term memory loss. Will check TSH and B12 with labs. Exam is normal today. Discussed referral for more in-depth cognitive testing, however will hold off for now.      Relevant Orders   B12   RESOLVED: Need for vaccination with 13-polyvalent pneumococcal conjugate vaccine   Relevant Orders   Pneumococcal conjugate vaccine 13-valent (Completed)   PAD (peripheral artery disease)    Vascular evaluation pending. Continue statin, Plavix.      Relevant Medications   metoprolol succinate (TOPROL XL) 25 MG 24 hr tablet   clopidogrel (PLAVIX) 75 MG tablet   simvastatin (ZOCOR) 20 MG tablet   gabapentin (NEURONTIN) 400 MG capsule   Pruritus    Recent pruritis after starting Myrbetriq. Recommended stopping medication and discussing with Urology at follow up scheduled.       Relevant Orders   TSH   Comprehensive metabolic panel       Return in about 3 months (around 03/19/2015) for Recheck.

## 2014-12-17 NOTE — Progress Notes (Signed)
Pre visit review using our clinic review tool, if applicable. No additional management support is needed unless otherwise documented below in the visit note. 

## 2014-12-17 NOTE — Assessment & Plan Note (Signed)
Pt notes mild short term memory loss. Will check TSH and B12 with labs. Exam is normal today. Discussed referral for more in-depth cognitive testing, however will hold off for now.

## 2014-12-20 ENCOUNTER — Encounter: Payer: Self-pay | Admitting: Internal Medicine

## 2014-12-21 ENCOUNTER — Other Ambulatory Visit: Payer: Self-pay

## 2014-12-21 DIAGNOSIS — I10 Essential (primary) hypertension: Secondary | ICD-10-CM

## 2014-12-21 MED ORDER — METOPROLOL SUCCINATE ER 25 MG PO TB24: 25.0000 mg | ORAL_TABLET | Freq: Every day | ORAL | Status: DC

## 2014-12-21 NOTE — Telephone Encounter (Signed)
Refill sent for metoprolol succ 

## 2014-12-30 ENCOUNTER — Other Ambulatory Visit: Payer: Self-pay | Admitting: *Deleted

## 2014-12-30 DIAGNOSIS — I739 Peripheral vascular disease, unspecified: Secondary | ICD-10-CM

## 2014-12-31 ENCOUNTER — Ambulatory Visit (INDEPENDENT_AMBULATORY_CARE_PROVIDER_SITE_OTHER): Payer: Medicare Other | Admitting: Urology

## 2014-12-31 ENCOUNTER — Encounter: Payer: Self-pay | Admitting: Urology

## 2014-12-31 ENCOUNTER — Other Ambulatory Visit: Payer: Self-pay | Admitting: *Deleted

## 2014-12-31 VITALS — BP 97/62 | HR 83 | Ht 69.0 in | Wt 200.5 lb

## 2014-12-31 DIAGNOSIS — N528 Other male erectile dysfunction: Secondary | ICD-10-CM

## 2014-12-31 DIAGNOSIS — R350 Frequency of micturition: Secondary | ICD-10-CM | POA: Diagnosis not present

## 2014-12-31 DIAGNOSIS — N200 Calculus of kidney: Secondary | ICD-10-CM

## 2014-12-31 DIAGNOSIS — N529 Male erectile dysfunction, unspecified: Secondary | ICD-10-CM

## 2014-12-31 DIAGNOSIS — I739 Peripheral vascular disease, unspecified: Secondary | ICD-10-CM

## 2014-12-31 DIAGNOSIS — Q251 Coarctation of aorta: Secondary | ICD-10-CM

## 2014-12-31 DIAGNOSIS — N400 Enlarged prostate without lower urinary tract symptoms: Secondary | ICD-10-CM | POA: Diagnosis not present

## 2014-12-31 DIAGNOSIS — L299 Pruritus, unspecified: Secondary | ICD-10-CM

## 2014-12-31 LAB — MICROSCOPIC EXAMINATION: Bacteria, UA: NONE SEEN

## 2014-12-31 LAB — URINALYSIS, COMPLETE
Bilirubin, UA: NEGATIVE
Glucose, UA: NEGATIVE
Ketones, UA: NEGATIVE
Leukocytes, UA: NEGATIVE
Nitrite, UA: NEGATIVE
Protein, UA: NEGATIVE
RBC, UA: NEGATIVE
Specific Gravity, UA: 1.015 (ref 1.005–1.030)
Urobilinogen, Ur: 0.2 mg/dL (ref 0.2–1.0)
pH, UA: 5 (ref 5.0–7.5)

## 2014-12-31 LAB — BLADDER SCAN AMB NON-IMAGING: Scan Result: 69

## 2014-12-31 NOTE — Progress Notes (Signed)
12/31/2014 3:05 PM   [name] 18-Nov-1945 [id number]  Referring provider: Shelia Media, MD [address] [place], [place] [address]  Chief Complaint  Patient presents with  . Establish Care    urinary frequency, hypogonadism, scrotal itching    HPI: 69 yo M with BPH, Pyronnies diease, ED, urinary frequency and scrotal itching.   He and his wife recently moved back to the area after living at [facility] for 4 years.  He was followed by a Urologist there and will obtain records for review.   Today's history provided by the patient.   He has severe urinary symptoms with urinary frequency, mild urgency, and nocturia x 3.  He does does have a fairly decent urinary stream, does feel that he is able to empty his bladder without straining but will then void shortly thereafter.  He does have some post void dribbling. He denies incontinence.  He has tried alpha blocker (Rapaflow) which didn't help much.  He was most recently on Mybetriq 25 mg which but does not think it helped much and since stopping this medication, his severe scrotal itching has improved.  He does drink mostly water and occasional decaf coffee.  He avoids drinking too much due to his irritative voiding symptoms.   He underwent injections for Pyronies which did help improve his curvature.  He does have severe baseline and has tried Cialis 5 mg daily, Viagra and Levitra which failed.  He has never tried ICI or VED.    He also has a history of kidney stones s/p EWSL x 2.  He most recently passed a stone 4 years ago.  He denies any recently flank pain.  No gross hematuria.  No recent imaging.  He does snore.  He denies history of a sleep study.    He also has history of hypogonadism on injections for symptoms of fatique,low T.  He did not see much benefits with injections.    He has been dx with borderline diabetes several years ago  but has not had any recent HbgA1c of which he is aware.  IPSS  Questionnaire (AUA-7): Over the past month.   1)  How often have you had a sensation of not emptying your bladder completely after you finish urinating?  1 - Less than 1 time in 5  2)  How often have you had to urinate again less than two hours after you finished urinating? 5 - Almost always  3)  How often have you found you stopped and started again several times when you urinated?  0 - Not at all  4) How difficult have you found it to postpone urination?  0 - Not at all  5) How often have you had a weak urinary stream?  0 - Not at all  6) How often have you had to push or strain to begin urination?  0 - Not at all  7) How many times did you most typically get up to urinate from the time you went to bed until the time you got up in the morning?  1 - 1 time  Total score:  0-7 mildly symptomatic   8-19 moderately symptomatic   20-35 severely symptomatic    Bother score: 6    PMH: Past Medical History  Diagnosis Date  . Coronary artery disease   . Peripheral vascular disease   . Hyperlipidemia   . Diabetes mellitus   . SOB (shortness of breath)   . Muscle ache   .  Muscle pain   . Ischemic leg   . Weakness   . Erectile dysfunction   . Hypertension   . Hypogonadism in male   . Enlarged prostate   . Arthritis   . Depression   . History of kidney stones     Surgical History: Past Surgical History  Procedure Laterality Date  . Cardiac catheterization  1992  . Ventral hernia repair  1994    complicated by infection  . Cholecystectomy  2000    also had revision of hernia repair    Home Medications:    Medication List       This list is accurate as of: 12/31/14 11:59 PM.  Always use your most recent med list.               acetaminophen 650 MG CR tablet  Commonly known as:  TYLENOL  Take 650 mg by mouth every 8 (eight) hours as needed.     clopidogrel 75 MG tablet  Commonly known as:  PLAVIX  Take 1 tablet (75 mg total) by mouth daily.     finasteride 5 MG  tablet  Commonly known as:  PROSCAR  Take 1 tablet (5 mg total) by mouth daily.     gabapentin 400 MG capsule  Commonly known as:  NEURONTIN  Take 1 capsule (400 mg total) by mouth 3 (three) times daily.     metoprolol succinate 25 MG 24 hr tablet  Commonly known as:  TOPROL XL  Take 1 tablet (25 mg total) by mouth daily.     mirabegron ER 25 MG Tb24 tablet  Commonly known as:  MYRBETRIQ  Take 25 mg by mouth daily.     multivitamin tablet  Take 1 tablet by mouth daily.     naproxen sodium 220 MG tablet  Commonly known as:  ANAPROX  Take 220 mg by mouth 2 (two) times daily with a meal.     simvastatin 20 MG tablet  Commonly known as:  ZOCOR  Take 1 tablet (20 mg total) by mouth daily.        Allergies:  Allergies  Allergen Reactions  . Fenofibrate   . Lipitor [Atorvastatin Calcium]     Family History: Family History  Problem Relation Age of Onset  . Coronary artery disease Father   . Heart disease Father   . Stroke Mother   . Stroke Sister   . Cancer Neg Hx     KIdney, Bladder, Prostate    Social History:  reports that he quit smoking about 22 years ago. His smoking use included Cigarettes. He has a 80 pack-year smoking history. He does not have any smokeless tobacco history on file. He reports that he drinks alcohol. He reports that he does not use illicit drugs.  ROS: Urological Symptom Review  Patient is experiencing the following symptoms: Frequent urination Hard to postpone urination Get up at night to urinate Leakage of urine Stream starts and stops Weak stream  Erectile problems  Review of Systems  Gastrointestinal (upper)  : Indigestion/heartburn  Gastrointestinal (lower) : Constipation  Constitutional : Fatigue  Skin: Itching  Eyes: Blurred vision  Ear/Nose/Throat : Negative for Ear/Nose/Throat symptoms  Hematologic/Lymphatic: Easy bruising  Cardiovascular : Leg swelling  Respiratory : Shortness of  breath  Endocrine: Negative for endocrine symptoms  Musculoskeletal: Back pain Joint pain  Neurological: Negative for neurological symptoms  Psychologic: Negative for psychiatric symptoms   Physical Exam: BP 97/62 mmHg  Pulse 83  Ht 5\' 9"  (  1.753 m)  Wt 200 lb 8 oz (90.946 kg)  BMI 29.60 kg/m2  Constitutional:  Alert and oriented, No acute distress. HEENT: Western AT, moist mucus membranes.  Trachea midline, no masses. Cardiovascular: No clubbing, cyanosis, or edema. Respiratory: Normal respiratory effort, no increased work of breathing. GI: Abdomen is soft, nontender, nondistended, no abdominal masses GU: No CVA tenderness. 40 cc gland, rubbery, no nodules.  Circumcised phallus with normal orthotopic meatus.  Scrotum with bilateral descended testicles, no masses, no tender.  No significant scrotal rash or excoriation. Skin: No rashes, bruises or suspicious lesions. Lymph: No cervical or inguinal adenopathy. Neurologic: Grossly intact, no focal deficits, moving all 4 extremities. Psychiatric: Normal mood and affect.  Laboratory Data: Lab Results  Component Value Date   WBC 8.4 12/17/2009   HGB 14.0 12/17/2009   HCT 40.2 12/17/2009   MCV 97.9 12/17/2009   PLT 164 12/17/2009    Lab Results  Component Value Date   CREATININE 1.08 12/17/2014    No results found for: PSA  No results found for: TESTOSTERONE  Lab Results  Component Value Date   HGBA1C * 12/18/2009    6.2 (NOTE)                                                                       According to the ADA Clinical Practice Recommendations for 2011, when HbA1c is used as a screening test:   >=6.5%   Diagnostic of Diabetes Mellitus           (if abnormal result  is confirmed)  5.7-6.4%   Increased risk of developing Diabetes Mellitus  References:Diagnosis and Classification of Diabetes Mellitus,Diabetes Care,2011,34(Suppl 1):S62-S69 and Standards of Medical Care in         Diabetes - 2011,Diabetes Care,2011,34   (Suppl 1):S11-S61.    Urinalysis Results for orders placed or performed in visit on 12/31/14  Microscopic Examination  Result Value Ref Range   WBC, UA 0-5 0 -  5 /hpf   RBC, UA 0-2 0 -  2 /hpf   Epithelial Cells (non renal) 0-10 0 - 10 /hpf   Bacteria, UA None seen None seen/Few  Urinalysis, Complete  Result Value Ref Range   Specific Gravity, UA 1.015 1.005 - 1.030   pH, UA 5.0 5.0 - 7.5   Color, UA Yellow Yellow   Appearance Ur Clear Clear   Leukocytes, UA Negative Negative   Protein, UA Negative Negative/Trace   Glucose, UA Negative Negative   Ketones, UA Negative Negative   RBC, UA Negative Negative   Bilirubin, UA Negative Negative   Urobilinogen, Ur 0.2 0.2 - 1.0 mg/dL   Nitrite, UA Negative Negative   Microscopic Examination See below:   PSA  Result Value Ref Range   Prostate Specific Ag, Serum 0.6 0.0 - 4.0 ng/mL  BLADDER SCAN AMB NON-IMAGING  Result Value Ref Range   Scan Result 69     Assessment & Plan:  69 yo M with multiple GU issues.  1. Urinary frequency Refractory irritative voiding symptoms.  He has presumably failed multiple anticholenergic before being started on most recently Myrbetriq.  He does appear to avoid irritating beverages along with other behavoural contributing factors.  Will need to review records to assess  for h/o cystoscopy to r/o CIS.  PVR today minimal.  Today, we discussed various possible contributing factors such as possible underlying untreated DM or OSA.  I have recommended f/u with his PCP to explore these.  We also discussed non pharmacological treatments today given his refractory symptoms including PTNS, Interstim and botox.  He is somewhat interested in PNTS and will arrange.  We discussed the procedure at length today including the treatment course and typical outcomes.   - BLADDER SCAN AMB NON-IMAGING - Urinalysis, Complete  2. BPH (benign prostatic hyperplasia) Slightly enlarged gland on DRE.  Will check PSA screening  today. - BLADDER SCAN AMB NON-IMAGING - Urinalysis, Complete - PSA  3. Pruritus, primarily scrotal ? Etiology.  Improving since stopping mybetriq.  No scrotal pathology identified on exam today.  4. Erectile dysfunction of organic origin + h/o Peyronie's  Refractory ED to PDEis, no interested in further intervention at this time.  5. Kidney stones Personal history of kidney stones, no recent flank pain. Will continue to monitor.   Schedule PTNS once previous Urologist records reviewed.    I spent >45 min of which more than 50% of the time was used coordinating/ counseling.   Vanna Scotland, MD  [facility] [address] [place], [place] [address] [phone]

## 2015-01-01 LAB — PSA: Prostate Specific Ag, Serum: 0.6 ng/mL (ref 0.0–4.0)

## 2015-01-02 ENCOUNTER — Encounter: Payer: Self-pay | Admitting: Urology

## 2015-01-03 ENCOUNTER — Telehealth: Payer: Self-pay

## 2015-01-03 NOTE — Telephone Encounter (Signed)
-----   Message from Harle Battiest, PA-C sent at 01/02/2015  8:58 PM EDT ----- PSA is normal. Dr. Apolinar Junes wants his records from his previous urologist prior to scheduling PTNS.

## 2015-01-03 NOTE — Telephone Encounter (Signed)
Spoke with pt. Pt stated his PCP Dr. Dan Humphreys is going to fax over previous medical records. Pt stated he saw Dr. Jamison Oka [phone] in [place] if we dont get records.

## 2015-01-04 DIAGNOSIS — M545 Low back pain: Secondary | ICD-10-CM | POA: Diagnosis not present

## 2015-01-04 DIAGNOSIS — M5136 Other intervertebral disc degeneration, lumbar region: Secondary | ICD-10-CM | POA: Diagnosis not present

## 2015-01-06 DIAGNOSIS — M5136 Other intervertebral disc degeneration, lumbar region: Secondary | ICD-10-CM | POA: Diagnosis not present

## 2015-01-06 DIAGNOSIS — M545 Low back pain: Secondary | ICD-10-CM | POA: Diagnosis not present

## 2015-01-07 ENCOUNTER — Encounter: Payer: Self-pay | Admitting: Vascular Surgery

## 2015-01-11 ENCOUNTER — Encounter: Payer: Self-pay | Admitting: Internal Medicine

## 2015-01-11 ENCOUNTER — Encounter: Payer: Self-pay | Admitting: Vascular Surgery

## 2015-01-11 ENCOUNTER — Ambulatory Visit (INDEPENDENT_AMBULATORY_CARE_PROVIDER_SITE_OTHER): Payer: Medicare Other | Admitting: Vascular Surgery

## 2015-01-11 ENCOUNTER — Ambulatory Visit (HOSPITAL_COMMUNITY)
Admission: RE | Admit: 2015-01-11 | Discharge: 2015-01-11 | Disposition: A | Discharge status: Home / Self Care | Payer: Medicare Other | Source: Ambulatory Visit | Attending: Vascular Surgery | Admitting: Vascular Surgery

## 2015-01-11 ENCOUNTER — Other Ambulatory Visit: Payer: Self-pay | Admitting: Vascular Surgery

## 2015-01-11 VITALS — BP 113/73 | HR 69 | Resp 16 | Ht 70.0 in | Wt 196.0 lb

## 2015-01-11 DIAGNOSIS — I739 Peripheral vascular disease, unspecified: Secondary | ICD-10-CM

## 2015-01-11 DIAGNOSIS — I2510 Atherosclerotic heart disease of native coronary artery without angina pectoris: Secondary | ICD-10-CM

## 2015-01-11 NOTE — Progress Notes (Signed)
Subjective:     Patient ID: [name], male   DOB: [birth year], 69 y.o.   MRN: [id number]  HPI This 69 year old male is referred by Dr.  Delane GingerPhil Nahser  For evaluation of lower extremity occlusive disease. Apparently I have seen this patient in the past but we do not have those records available in our office. The patient has had stable claudication symptoms for many years and both lower extremities right worse than left. He was told back in the 1990s that he had an occluded aorta. I'm not certain what test was done to confirm this. He did have an attempt at an angiogram or cardiac catheterization via the right groin on one occasion and that was unsuccessful and the test was then done via left brachial approach. He denies rest pain, nonhealing ulcers, infection, gangrene, rest pain, or numbness in the feet. He is able to ambulate about one quarter of mile at which time he stops because of leg discomfort and shortness of breath. He does not think the symptoms have worsened over the last several months.  Past Medical History  Diagnosis Date  . Coronary artery disease   . Peripheral vascular disease   . Hyperlipidemia   . Diabetes mellitus   . SOB (shortness of breath)   . Muscle ache   . Muscle pain   . Ischemic leg   . Weakness   . Erectile dysfunction   . Hypertension   . Hypogonadism in male   . Enlarged prostate   . Arthritis   . Depression   . History of kidney stones     History  Substance Use Topics  . Smoking status: Former Smoker -- 2.00 packs/day for 40 years    Types: Cigarettes    Quit date: 10/17/1992  . Smokeless tobacco: Not on file     Comment: quit 1994  . Alcohol Use: 0.0 oz/week    0 Standard drinks or equivalent per week     Comment: seldom    Family History  Problem Relation Age of Onset  . Coronary artery disease Father   . Heart disease Father   . Stroke Mother   . Stroke Sister   . Cancer Neg Hx     KIdney, Bladder, Prostate    Allergies  Allergen  Reactions  . Fenofibrate   . Lipitor [Atorvastatin Calcium]      Current outpatient prescriptions:  .  acetaminophen (TYLENOL) 650 MG CR tablet, Take 650 mg by mouth every 8 (eight) hours as needed., Disp: , Rfl:  .  clopidogrel (PLAVIX) 75 MG tablet, Take 1 tablet (75 mg total) by mouth daily., Disp: 90 tablet, Rfl: 3 .  finasteride (PROSCAR) 5 MG tablet, Take 1 tablet (5 mg total) by mouth daily., Disp: 90 tablet, Rfl: 3 .  gabapentin (NEURONTIN) 400 MG capsule, Take 1 capsule (400 mg total) by mouth 3 (three) times daily., Disp: 270 capsule, Rfl: 3 .  metoprolol succinate (TOPROL XL) 25 MG 24 hr tablet, Take 1 tablet (25 mg total) by mouth daily., Disp: 90 tablet, Rfl: 3 .  Multiple Vitamin (MULTIVITAMIN) tablet, Take 1 tablet by mouth daily., Disp: , Rfl:  .  naproxen sodium (ANAPROX) 220 MG tablet, Take 220 mg by mouth 2 (two) times daily with a meal., Disp: , Rfl:  .  simvastatin (ZOCOR) 20 MG tablet, Take 1 tablet (20 mg total) by mouth daily., Disp: 90 tablet, Rfl: 3 .  mirabegron ER (MYRBETRIQ) 25 MG TB24 tablet, Take 25 mg by  mouth daily., Disp: , Rfl:   Filed Vitals:   01/11/15 1248  BP: 113/73  Pulse: 69  Resp: 16  Height:  (1.778 m)  Weight: 196 lb (88.905 kg)    Body mass index is 28.12 kg/(m^2).           Review of Systems Denies chest pain ,  Productive cough, hemoptysis, patient does have dyspnea on exertion and leg pain with ambulation as noted above. Other systems negative and a complete review of systems     Objective:   Physical Exam BP 113/73 mmHg  Pulse 69  Resp 16  Ht  (1.778 m)  Wt 196 lb (88.905 kg)  BMI 28.12 kg/m2  Gen.-alert and oriented x3 in no apparent distress HEENT normal for age Lungs no rhonchi or wheezing Cardiovascular regular rhythm no murmurs carotid pulses 3+ palpable no bruits audible Abdomen soft nontender no palpable masses Musculoskeletal free of  major deformities Skin clear -no rashes Neurologic  normal Lower extremities  Absent right femoral and distal pulses 2+ left femoral pulse with no distal pulses palpable.  both feet adequately perfused with no evidence  Of infection, cellulitis, gangrene.  Today I ordered lower extremity ABIs are reviewed and interpreted. There is monophasic flow in both feet with ABI of 0.81 in the right and 0.80 on the left.       Assessment:      #1 probable aortoiliac occlusive disease and possible bilateral superficial femoral occlusive disease with stable claudication.  #2 doubt occlusion of aorta with 2+ left femoral pulse palpable. Suspect he does have bilateral iliac occlusive disease #3 patient is limited as much as by dyspnea on exertion as by his claudication which does not seem to be limiting his activities at the present time and is not limb threatening     #4 remote history of tobacco abuse -quit in 1994 Plan:      would not recommend any further vascular workup at this time. If patient develops worsening claudication which is quite limiting or obviously if he develops a limb threatening problem then we should proceed with further evaluation. I discussed this with him and he understands an agrees   patient will continue with his Plavix which he is currently taking

## 2015-01-13 DIAGNOSIS — M5136 Other intervertebral disc degeneration, lumbar region: Secondary | ICD-10-CM | POA: Diagnosis not present

## 2015-01-13 DIAGNOSIS — M545 Low back pain: Secondary | ICD-10-CM | POA: Diagnosis not present

## 2015-01-31 ENCOUNTER — Telehealth: Payer: Self-pay | Admitting: Internal Medicine

## 2015-01-31 NOTE — Telephone Encounter (Signed)
Received mychart message requesting Tetanus/Tdap and Zostavax. Please advise if okay to schedule.msn

## 2015-02-01 NOTE — Telephone Encounter (Signed)
I would recommend getting Zostavax at a local pharmacy as it is generally much less expensive there. I would also recommend going to the health dept for the Tdap as it is generally not covered by Medicare

## 2015-02-01 NOTE — Telephone Encounter (Signed)
Notified pt. 

## 2015-02-01 NOTE — Telephone Encounter (Signed)
Please see below request  

## 2015-02-04 ENCOUNTER — Telehealth: Payer: Self-pay | Admitting: Urology

## 2015-02-04 NOTE — Telephone Encounter (Signed)
[name] called to make sure the paperwork his wife dropped off 1-2 weeks ago was received. He said the paperwork were his medical records from Dr. Theodoro Parma office at [facility]. He'd discussed possibly having a procedure with Dr. Apolinar Junes and she needed information from Dr. Tilman Neat office. [name] wants to confirm the paperwork is enough information for Dr. Apolinar Junes to decide if they should move forward with the treatment that was discussed. He'd like a phone call so he'll know if he needs to have more information sent to Dr. Apolinar Junes or if what she has will suffice. Pt ph# [phone] Thank you.  Note: I spoke with Dr. Apolinar Junes and she confirmed she has the paperwork on this patient but she's not had a chance to review the information. I called [name] to make him aware of this.

## 2015-02-24 ENCOUNTER — Encounter: Payer: Self-pay | Admitting: Internal Medicine

## 2015-02-24 ENCOUNTER — Ambulatory Visit (INDEPENDENT_AMBULATORY_CARE_PROVIDER_SITE_OTHER): Payer: Medicare Other | Admitting: Internal Medicine

## 2015-02-24 VITALS — BP 118/60 | HR 73 | Temp 98.0°F | Resp 18 | Ht 70.0 in | Wt 206.6 lb

## 2015-02-24 DIAGNOSIS — L309 Dermatitis, unspecified: Secondary | ICD-10-CM | POA: Diagnosis not present

## 2015-02-24 DIAGNOSIS — I2510 Atherosclerotic heart disease of native coronary artery without angina pectoris: Secondary | ICD-10-CM | POA: Diagnosis not present

## 2015-02-24 MED ORDER — SELENIUM SULFIDE 2.25 % EX SHAM: 1.0000 "application " | MEDICATED_SHAMPOO | Freq: Every day | CUTANEOUS | Status: DC

## 2015-02-24 MED ORDER — CLOBETASOL PROPIONATE 0.05 % EX OINT: 1.0000 "application " | TOPICAL_OINTMENT | Freq: Two times a day (BID) | CUTANEOUS | Status: DC

## 2015-02-24 NOTE — Progress Notes (Signed)
Subjective:    Patient ID: [name], male    DOB: 09-21-1945, 69 y.o.   MRN: [id number]  HPI  68YO male presents for acute visit.  Rash - Last 6-8 weeks. Notes rash on elbows and in scalp.  Lesions are pruritic, raised, red. Not painful. Taking Benadryl at night. Tried Hydrocortisone no improvement. Used Head and Shoulders with no improvement. No new medications, lotions, soaps prior to onset of rash. No recent fever, chills, headache. No other rash.  Past Medical History  Diagnosis Date  . Coronary artery disease   . Peripheral vascular disease   . Hyperlipidemia   . Diabetes mellitus   . SOB (shortness of breath)   . Muscle ache   . Muscle pain   . Ischemic leg   . Weakness   . Erectile dysfunction   . Hypertension   . Hypogonadism in male   . Enlarged prostate   . Arthritis   . Depression   . History of kidney stones    Family History  Problem Relation Age of Onset  . Coronary artery disease Father   . Heart disease Father   . Stroke Mother   . Stroke Sister   . Cancer Neg Hx     KIdney, Bladder, Prostate   Past Surgical History  Procedure Laterality Date  . Cardiac catheterization  1992  . Ventral hernia repair  1994    complicated by infection  . Cholecystectomy  2000    also had revision of hernia repair   Social History   Social History  . Marital Status: Married    Spouse Name: N/A  . Number of Children: N/A  . Years of Education: N/A   Social History Main Topics  . Smoking status: Former Smoker -- 2.00 packs/day for 40 years    Types: Cigarettes    Quit date: 10/17/1992  . Smokeless tobacco: Not on file     Comment: quit 1994  . Alcohol Use: 0.0 oz/week    0 Standard drinks or equivalent per week     Comment: seldom  . Drug Use: No  . Sexual Activity: Not on file   Other Topics Concern  . Not on file   Social History Narrative   Lives in [place] with wife. No pets      Two children.      Work - Retired. 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      Hobbies - reading, drives Corvette, chuch    Review of Systems  Constitutional: Negative for fever, chills, activity change, appetite change, fatigue and unexpected weight change.  Eyes: Negative for visual disturbance.  Respiratory: Negative for cough and shortness of breath.   Cardiovascular: Negative for chest pain, palpitations and leg swelling.  Gastrointestinal: Negative for abdominal pain and abdominal distention.  Genitourinary: Negative for dysuria, urgency and difficulty urinating.  Musculoskeletal: Negative for arthralgias and gait problem.  Skin: Positive for rash. Negative for color change, pallor and wound.  Neurological: Negative for headaches.  Hematological: Negative for adenopathy.  Psychiatric/Behavioral: Negative for sleep disturbance and dysphoric mood. The patient is not nervous/anxious.        Objective:    BP 118/60 mmHg  Pulse 73  Temp(Src) 98 F (36.7 C)  Resp 18  Ht  (1.778 m)  Wt 206 lb 9.6 oz (93.713 kg)  BMI 29.64 kg/m2  SpO2 95% Physical Exam  Constitutional: He is oriented to person,  place, and time. He appears well-developed and well-nourished. No distress.  HENT:  Head: Normocephalic.  Eyes: Pupils are equal, round, and reactive to light.  Neck: Normal range of motion.  Pulmonary/Chest: Effort normal.  Neurological: He is alert and oriented to person, place, and time.  Skin: Skin is warm. Rash noted. Rash is pustular (over elbows bilaterally, scaling). He is not diaphoretic.     Psychiatric: He has a normal mood and affect. His behavior is normal. Judgment normal.          Assessment & Plan:   Problem List Items Addressed This Visit      Unprioritized   Dermatitis - Primary    Rash over elbows appears most consistent with psoriasis. Will start Clobetasol topically bid. Lesions in scalp may also be related to psoriasis, however we discussed  that largest lesion in right mid scalp may need biopsy to confirm. Discussed other potential diagnoses. Will set up dermatology evaluation. In interim, start topical Clobetasol and Selenium Sulfide shampoo to see if any improvement. Recheck 1 week.      Relevant Medications   clobetasol ointment (TEMOVATE) 0.05 %   Selenium Sulfide 2.25 % SHAM   Other Relevant Orders   Ambulatory referral to Dermatology       Return in about 1 week (around 03/03/2015) for Recheck.

## 2015-02-24 NOTE — Assessment & Plan Note (Addendum)
Rash over elbows appears most consistent with psoriasis. Will start Clobetasol topically bid. Lesions in scalp may also be related to psoriasis, however we discussed that largest lesion in right mid scalp may need biopsy to confirm. Discussed other potential diagnoses. Will set up dermatology evaluation. In interim, start topical Clobetasol and Selenium Sulfide shampoo to see if any improvement. Recheck 1 week.

## 2015-02-24 NOTE — Patient Instructions (Addendum)
Apply pea-sized amount of Clobetasol on skin over elbows twice daily and to lesions on scalp twice daily.  Use Selenium shampoo daily. Leave on scalp for 10-43min.  We will also set up evaluation with dermatology.

## 2015-03-01 ENCOUNTER — Telehealth: Payer: Self-pay | Admitting: Urology

## 2015-03-01 DIAGNOSIS — E291 Testicular hypofunction: Secondary | ICD-10-CM

## 2015-03-01 NOTE — Telephone Encounter (Addendum)
Reviewed charts from [facility] dating back 2 years.  Mr. Madaline Guthrie has been on numerous medications for BPH including Flomax, Rapaflo, Cialis, Proscar and most recently Mirbetriq.  Postvoid residuals all under 100 cc.  Patient also treated for hypogonadism with baseline T of 265 on 12/03/2012.  He was first treated with Androgel then  received depo T injections, 300 mg transitioned to 175 mg with fairly decent affect.    Per records, he underwent left ESWL on 04/10/2013. He was also being treated with Urocit-K 20 mEq twice a day was later changed to Urocit-K daily at bedtime.  Patient was also treated for bilateral sebaceous cysts of the scrotum which were drained.  Discussed plan with patient on phone: 1) Now feeling more fatigued, wants to restart testosterone therapy IM.  Will need new baseline testosterone, ordered for tomorrow AM.  He will discuss symptoms further with Carollee Herter at f/u tomorrow.    2) Given irritative voiding symptoms, recommend office cystoscopy/ urine cytology to r/o CIS.  Patient has not had scope in many, many years.   3) Will discuss refractory OAB symptoms at f/u tomorrow at length including options such as PTNS, etc.    Vanna Scotland, MD

## 2015-03-02 ENCOUNTER — Encounter: Payer: Self-pay | Admitting: Urology

## 2015-03-02 ENCOUNTER — Ambulatory Visit (INDEPENDENT_AMBULATORY_CARE_PROVIDER_SITE_OTHER): Payer: Medicare Other | Admitting: Urology

## 2015-03-02 VITALS — BP 102/62 | HR 80 | Ht 69.0 in | Wt 207.4 lb

## 2015-03-02 DIAGNOSIS — I2510 Atherosclerotic heart disease of native coronary artery without angina pectoris: Secondary | ICD-10-CM | POA: Diagnosis not present

## 2015-03-02 DIAGNOSIS — N401 Enlarged prostate with lower urinary tract symptoms: Secondary | ICD-10-CM | POA: Diagnosis not present

## 2015-03-02 DIAGNOSIS — E291 Testicular hypofunction: Secondary | ICD-10-CM

## 2015-03-02 DIAGNOSIS — N138 Other obstructive and reflux uropathy: Secondary | ICD-10-CM

## 2015-03-02 DIAGNOSIS — N400 Enlarged prostate without lower urinary tract symptoms: Secondary | ICD-10-CM | POA: Diagnosis not present

## 2015-03-02 DIAGNOSIS — N318 Other neuromuscular dysfunction of bladder: Secondary | ICD-10-CM

## 2015-03-02 MED ORDER — TESTOSTERONE CYPIONATE 200 MG/ML IM SOLN: 200.0000 mg | INTRAMUSCULAR | Status: DC

## 2015-03-02 MED ORDER — TESTOSTERONE CYPIONATE 200 MG/ML IM SOLN
200.0000 mg | Freq: Once | INTRAMUSCULAR | Status: AC
  Administered 2015-03-02: 200 mg via INTRAMUSCULAR

## 2015-03-02 NOTE — Progress Notes (Signed)
Testosterone IM Injection  Due to Hypogonadism patient is present today for a Testosterone Injection.  Medication: Testosterone Cypionate Dose: /ML Medication: left upper outer buttocks Lot: [id number] Exp:10/2016  Patient tolerated well, no complications were noted  Preformed by: Georgiann Hahn, CMA

## 2015-03-03 LAB — CBC WITH DIFFERENTIAL/PLATELET
Basophils Absolute: 0 10*3/uL (ref 0.0–0.2)
Basos: 1 %
EOS (ABSOLUTE): 0.1 10*3/uL (ref 0.0–0.4)
Eos: 2 %
Hematocrit: 41.6 % (ref 37.5–51.0)
Hemoglobin: 13.9 g/dL (ref 12.6–17.7)
Immature Grans (Abs): 0 10*3/uL (ref 0.0–0.1)
Immature Granulocytes: 0 %
Lymphocytes Absolute: 1.9 10*3/uL (ref 0.7–3.1)
Lymphs: 31 %
MCH: 32.3 pg (ref 26.6–33.0)
MCHC: 33.4 g/dL (ref 31.5–35.7)
MCV: 97 fL (ref 79–97)
Monocytes Absolute: 0.8 10*3/uL (ref 0.1–0.9)
Monocytes: 12 %
Neutrophils Absolute: 3.4 10*3/uL (ref 1.4–7.0)
Neutrophils: 54 %
Platelets: 158 10*3/uL (ref 150–379)
RBC: 4.3 x10E6/uL (ref 4.14–5.80)
RDW: 13 % (ref 12.3–15.4)
WBC: 6.3 10*3/uL (ref 3.4–10.8)

## 2015-03-03 LAB — FSH/LH
FSH: 42.3 m[IU]/mL — ABNORMAL HIGH (ref 1.5–12.4)
LH: 36.4 m[IU]/mL — ABNORMAL HIGH (ref 1.7–8.6)

## 2015-03-03 LAB — TSH: TSH: 2.39 u[IU]/mL (ref 0.450–4.500)

## 2015-03-03 LAB — PROLACTIN: Prolactin: 11.3 ng/mL (ref 4.0–15.2)

## 2015-03-03 LAB — TESTOSTERONE: Testosterone: 131 ng/dL — ABNORMAL LOW (ref 348–1197)

## 2015-03-04 ENCOUNTER — Telehealth: Payer: Self-pay

## 2015-03-04 ENCOUNTER — Encounter: Payer: Self-pay | Admitting: Internal Medicine

## 2015-03-04 ENCOUNTER — Ambulatory Visit (INDEPENDENT_AMBULATORY_CARE_PROVIDER_SITE_OTHER): Payer: Medicare Other | Admitting: Internal Medicine

## 2015-03-04 VITALS — BP 112/60 | HR 82 | Temp 97.8°F | Wt 206.6 lb

## 2015-03-04 DIAGNOSIS — L309 Dermatitis, unspecified: Secondary | ICD-10-CM | POA: Diagnosis not present

## 2015-03-04 DIAGNOSIS — L299 Pruritus, unspecified: Secondary | ICD-10-CM

## 2015-03-04 DIAGNOSIS — E291 Testicular hypofunction: Secondary | ICD-10-CM | POA: Diagnosis not present

## 2015-03-04 DIAGNOSIS — I2510 Atherosclerotic heart disease of native coronary artery without angina pectoris: Secondary | ICD-10-CM | POA: Diagnosis not present

## 2015-03-04 NOTE — Assessment & Plan Note (Signed)
Testosterone low and LH FSH elevated. Reviewed urology notes. Will place endocrine referral for evaluation.

## 2015-03-04 NOTE — Patient Instructions (Signed)
We will set up evaluation with endocrinology and dermatology.  Follow up in 3 months or sooner as needed.

## 2015-03-04 NOTE — Assessment & Plan Note (Signed)
Rash in scalp and elbows improved with clobetasol. Dermatology evaluation pending. Will follow.

## 2015-03-04 NOTE — Telephone Encounter (Signed)
Spoke with pt who stated he was able to get testosterone filled and came back the same day for an injection. Made pt aware of lab levels and the need of needing to see an endocrinologist. Pt voiced understanding.

## 2015-03-04 NOTE — Progress Notes (Signed)
Pre visit review using our clinic review tool, if applicable. No additional management support is needed unless otherwise documented below in the visit note. 

## 2015-03-04 NOTE — Telephone Encounter (Signed)
-----   Message from Harle Battiest, PA-C sent at 03/04/2015 10:39 AM EDT ----- Patient's testosterone is low, but his LH and FSH are elevated.  He will need to see an endocrinologist to be evaluated for further causes of his hypogonadism.   Was he able to get his testosterone filled?  Did he come in for his injection?

## 2015-03-04 NOTE — Assessment & Plan Note (Signed)
Chronic discussed allergy testing. He declines for now. Exam is normal. Recommended adding daily Zyrtec to prevent symptoms.

## 2015-03-04 NOTE — Progress Notes (Signed)
Subjective:    Patient ID: [name], male    DOB: January 22, 1946, 69 y.o.   MRN: [id number]  HPI  69YO male presents for followup.  Recently seen for rash. Started on Clobetasol. Symptoms improved. However, notes some generalized itching of his skin over palms and in ears. Has not yet seen dermatology. Not taking anything for this.  Also recently seen by urology. Testosterone level low, and started on testosterone supplementation. FSH and LH were elevated.  Recommended endo evaluation. This has not been scheduled.   Past Medical History  Diagnosis Date  . Coronary artery disease   . Peripheral vascular disease   . Hyperlipidemia   . Diabetes mellitus   . SOB (shortness of breath)   . Muscle ache   . Muscle pain   . Ischemic leg   . Weakness   . Erectile dysfunction   . Hypertension   . Hypogonadism in male   . Enlarged prostate   . Arthritis   . Depression   . History of kidney stones    Family History  Problem Relation Age of Onset  . Coronary artery disease Father   . Heart disease Father   . Stroke Mother   . Stroke Sister   . Prostate cancer Neg Hx   . Kidney disease Neg Hx    Past Surgical History  Procedure Laterality Date  . Cardiac catheterization  1992  . Ventral hernia repair  1994    complicated by infection  . Cholecystectomy  2000    also had revision of hernia repair  . Cystectomy      face and scrotum   Social History   Social History  . Marital Status: Married    Spouse Name: N/A  . Number of Children: N/A  . Years of Education: N/A   Social History Main Topics  . Smoking status: Former Smoker -- 2.00 packs/day for 40 years    Types: Cigarettes    Quit date: 10/17/1992  . Smokeless tobacco: Not on file     Comment: quit 1994  . Alcohol Use: 0.0 oz/week    0 Standard drinks or equivalent per week     Comment: seldom  . Drug Use: No  . Sexual Activity: Not on file   Other Topics Concern  . Not on file   Social History  Narrative   Lives in [place] with wife. No pets      Two children.      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      Hobbies - reading, drives Corvette, chuch    Review of Systems  Constitutional: Negative for fever, chills, activity change, appetite change, fatigue and unexpected weight change.  Eyes: Negative for visual disturbance.  Respiratory: Negative for cough and shortness of breath.   Cardiovascular: Negative for chest pain, palpitations and leg swelling.  Gastrointestinal: Negative for abdominal pain and abdominal distention.  Genitourinary: Negative for dysuria, urgency and difficulty urinating.  Musculoskeletal: Negative for arthralgias and gait problem.  Skin: Negative for color change, rash and wound.  Hematological: Negative for adenopathy.  Psychiatric/Behavioral: Negative for sleep disturbance and dysphoric mood. The patient is not nervous/anxious.        Objective:    BP 112/60 mmHg  Pulse 82  Temp(Src) 97.8 F (36.6 C) (Oral)  Wt 206 lb 9.6 oz (93.713 kg)  SpO2 95% Physical Exam  Constitutional: He is oriented to person,  place, and time. He appears well-developed and well-nourished. No distress.  HENT:  Head: Normocephalic and atraumatic.  Right Ear: External ear normal.  Left Ear: External ear normal.  Nose: Nose normal.  Mouth/Throat: Oropharynx is clear and moist. No oropharyngeal exudate.  Eyes: Conjunctivae and EOM are normal. Pupils are equal, round, and reactive to light. Right eye exhibits no discharge. Left eye exhibits no discharge. No scleral icterus.  Neck: Normal range of motion. Neck supple. No tracheal deviation present. No thyromegaly present.  Cardiovascular: Normal rate, regular rhythm and normal heart sounds.  Exam reveals no gallop and no friction rub.   No murmur heard. Pulmonary/Chest: Effort normal and breath sounds normal. No accessory muscle usage. No  tachypnea. No respiratory distress. He has no decreased breath sounds. He has no wheezes. He has no rhonchi. He has no rales. He exhibits no tenderness.  Musculoskeletal: Normal range of motion. He exhibits no edema.  Lymphadenopathy:    He has no cervical adenopathy.  Neurological: He is alert and oriented to person, place, and time. No cranial nerve deficit. Coordination normal.  Skin: Skin is warm and dry. No rash noted. He is not diaphoretic. No erythema. No pallor.  Psychiatric: He has a normal mood and affect. His behavior is normal. Judgment and thought content normal.          Assessment & Plan:   Problem List Items Addressed This Visit      Unprioritized   Dermatitis - Primary    Rash in scalp and elbows improved with clobetasol. Dermatology evaluation pending. Will follow.      Hypogonadism male    Testosterone low and LH FSH elevated. Reviewed urology notes. Will place endocrine referral for evaluation.      Relevant Orders   Ambulatory referral to Endocrinology   Pruritus    Chronic discussed allergy testing. He declines for now. Exam is normal. Recommended adding daily Zyrtec to prevent symptoms.          Return in about 3 months (around 06/04/2015) for Recheck.

## 2015-03-07 ENCOUNTER — Telehealth: Payer: Self-pay | Admitting: Internal Medicine

## 2015-03-07 NOTE — Telephone Encounter (Signed)
Dermatology visit scheduled.

## 2015-03-09 ENCOUNTER — Encounter: Payer: Self-pay | Admitting: Internal Medicine

## 2015-03-10 DIAGNOSIS — N138 Other obstructive and reflux uropathy: Secondary | ICD-10-CM | POA: Insufficient documentation

## 2015-03-10 DIAGNOSIS — N3281 Overactive bladder: Secondary | ICD-10-CM | POA: Insufficient documentation

## 2015-03-10 DIAGNOSIS — N401 Enlarged prostate with lower urinary tract symptoms: Secondary | ICD-10-CM | POA: Insufficient documentation

## 2015-03-10 DIAGNOSIS — E291 Testicular hypofunction: Secondary | ICD-10-CM | POA: Insufficient documentation

## 2015-03-10 DIAGNOSIS — N318 Other neuromuscular dysfunction of bladder: Secondary | ICD-10-CM

## 2015-03-10 NOTE — Progress Notes (Signed)
03/02/2015 12:40 PM   [name] 03/11/1946 [id number]  Referring provider: Shelia Media, MD [address] [place], [place] [address]  Chief Complaint  Patient presents with  . Benign Prostatic Hypertrophy    urinary frequency 2 month check up   . Erectile Dysfunction    HPI: [name] is a 69 year old white male with hypogonadism, ED and BPH with LUTS who presents today to discuss restarting his testosterone cypionate therapy and other treatment modalities for his refractory OAB symptoms.    Hypogonadism Patient is now feeling more fatigued and he would like to restart his testosterone therapy.  He was initially treated with AndroGel and then was changed to testosterone injections.  He feels he had the best response with the injections, so he would like to continue with the shots.  He presented earlier this morning for his serum total testosterone, FSH/LH, TSH, prolactin and CBC with differential.        Androgen Deficiency in the Aging Male      03/02/15 1400       Androgen Deficiency in the Aging Male   Do you have a decrease in libido (sex drive) Yes     Do you have lack of energy Yes     Do you have a decrease in strength and/or endurance Yes     Have you lost height Yes     Have you noticed a decreased "enjoyment of life" Yes     Are you sad and/or grumpy Yes     Are your erections less strong Yes     Have you noticed a recent deterioration in your ability to play sports Yes     Are you falling asleep after dinner Yes     Has there been a recent deterioration in your work performance No         BPH WITH LUTS Patient complains of frequency, urgency, nocturia, leakage of urine, intermittency and a weak urinary stream.   He has been on tamsulosin, Rapaflo, finasteride, Cialis and Myrbetriq.  His PVR have been under 100 cc.  He has not undergone a cystoscopy/urine cytology to rule out CIS.  He would like to discuss other treatment options for his  irritative symptoms.    PMH: Past Medical History  Diagnosis Date  . Coronary artery disease   . Peripheral vascular disease   . Hyperlipidemia   . Diabetes mellitus   . SOB (shortness of breath)   . Muscle ache   . Muscle pain   . Ischemic leg   . Weakness   . Erectile dysfunction   . Hypertension   . Hypogonadism in male   . Enlarged prostate   . Arthritis   . Depression   . History of kidney stones     Surgical History: Past Surgical History  Procedure Laterality Date  . Cardiac catheterization  1992  . Ventral hernia repair  1994    complicated by infection  . Cholecystectomy  2000    also had revision of hernia repair  . Cystectomy      face and scrotum    Home Medications:    Medication List       This list is accurate as of: 03/02/15 11:59 PM.  Always use your most recent med list.               acetaminophen 650 MG CR tablet  Commonly known as:  TYLENOL  Take 650 mg by mouth every  8 (eight) hours as needed.     clobetasol ointment 0.05 %  Commonly known as:  TEMOVATE  Apply 1 application topically 2 (two) times daily.     clopidogrel 75 MG tablet  Commonly known as:  PLAVIX  Take 1 tablet (75 mg total) by mouth daily.     finasteride 5 MG tablet  Commonly known as:  PROSCAR  Take 1 tablet (5 mg total) by mouth daily.     gabapentin 400 MG capsule  Commonly known as:  NEURONTIN  Take 1 capsule (400 mg total) by mouth 3 (three) times daily.     metoprolol succinate 25 MG 24 hr tablet  Commonly known as:  TOPROL XL  Take 1 tablet (25 mg total) by mouth daily.     mirabegron ER 25 MG Tb24 tablet  Commonly known as:  MYRBETRIQ  Take 25 mg by mouth daily.     multivitamin tablet  Take 1 tablet by mouth daily.     naproxen sodium 220 MG tablet  Commonly known as:  ANAPROX  Take 220 mg by mouth 2 (two) times daily with a meal.     Selenium Sulfide 2.25 % Sham  Apply 1 application topically daily.     simvastatin 20 MG tablet    Commonly known as:  ZOCOR  Take 1 tablet (20 mg total) by mouth daily.     testosterone cypionate 200 MG/ML injection  Commonly known as:  DEPOTESTOSTERONE CYPIONATE  Inject 1 mL (200 mg total) into the muscle every 14 (fourteen) days.        Allergies:  Allergies  Allergen Reactions  . Fenofibrate   . Lipitor [Atorvastatin Calcium]     Family History: Family History  Problem Relation Age of Onset  . Coronary artery disease Father   . Heart disease Father   . Stroke Mother   . Stroke Sister   . Prostate cancer Neg Hx   . Kidney disease Neg Hx     Social History:  reports that he quit smoking about 22 years ago. His smoking use included Cigarettes. He has a 80 pack-year smoking history. He does not have any smokeless tobacco history on file. He reports that he drinks alcohol. He reports that he does not use illicit drugs.  ROS: UROLOGY Frequent Urination?: Yes Hard to postpone urination?: Yes Burning/pain with urination?: No Get up at night to urinate?: Yes Leakage of urine?: Yes Urine stream starts and stops?: Yes Trouble starting stream?: No Do you have to strain to urinate?: No Blood in urine?: No Urinary tract infection?: No Sexually transmitted disease?: No Injury to kidneys or bladder?: No Painful intercourse?: No Weak stream?: Yes Erection problems?: Yes Penile pain?: Yes  Gastrointestinal Nausea?: No Vomiting?: No Indigestion/heartburn?: No Diarrhea?: No Constipation?: Yes  Constitutional Fever: No Night sweats?: No Weight loss?: No Fatigue?: Yes  Skin Skin rash/lesions?: Yes Itching?: Yes  Eyes Blurred vision?: Yes Double vision?: No  Ears/Nose/Throat Sore throat?: No Sinus problems?: No  Hematologic/Lymphatic Swollen glands?: No Easy bruising?: Yes  Cardiovascular Leg swelling?: No Chest pain?: No  Respiratory Cough?: No Shortness of breath?: Yes  Endocrine Excessive thirst?: No  Musculoskeletal Back pain?:  Yes Joint pain?: No  Neurological Headaches?: No Dizziness?: No  Psychologic Depression?: No Anxiety?: Yes  Physical Exam: BP 102/62 mmHg  Pulse 80  Ht  (1.753 m)  Wt 207 lb 6.4 oz (94.076 kg)  BMI 30.61 kg/m2   Laboratory Data: Lab Results  Component Value Date  WBC 6.3 03/02/2015   HGB 14.0 12/17/2009   HCT 41.6 03/02/2015   MCV 97.9 12/17/2009   PLT 164 12/17/2009    Lab Results  Component Value Date   CREATININE 1.08 12/17/2014    Lab Results  Component Value Date   PSA 0.6 12/31/2014    Lab Results  Component Value Date   TESTOSTERONE 131* 03/02/2015    Lab Results  Component Value Date   HGBA1C * 12/18/2009    6.2 (NOTE)                                                                       According to the ADA Clinical Practice Recommendations for 2011, when HbA1c is used as a screening test:   >=6.5%   Diagnostic of Diabetes Mellitus           (if abnormal result  is confirmed)  5.7-6.4%   Increased risk of developing Diabetes Mellitus  References:Diagnosis and Classification of Diabetes Mellitus,Diabetes Care,2011,34(Suppl 1):S62-S69 and Standards of Medical Care in         Diabetes - 2011,Diabetes Care,2011,34  (Suppl 1):S11-S61.     Assessment & Plan:    1. BPH (benign prostatic hyperplasia) with LUTS:    We discussed PTNS, InterStim placement and Botox therapy.  I explained how each procedure was performed and the risks and benefits of each.  He was mostly interested in the PTNS modality, but he wasn't ready to proceed with the treatment at this time.  He explained that because it would require a large time commitment from him, he would like some more information and time to think about the treatment.  I have given him a brochure, which has information about the web site, so he could do more research on his own.  He will contact the office when he is ready to schedule the therapy.   2. Hypogonadism in male:   Patient is reminded of the side  effects and risks of testosterone therapy.  He understands and would like to start his injections today.  I have given him a prescription for the medication and he will RTC for is first injection.  We will start with 200 mg IM every 2 weeks and titrate as needed.  He will have his morning serum testosterone rechecked in three months.    - Testosterone - FSH/LH - TSH - CBC with Differential/Platelet - Prolactin - testosterone cypionate (DEPOTESTOSTERONE CYPIONATE) 200 MG/ML injection; Inject 1 mL (200 mg total) into the muscle every 14 (fourteen) days.  Dispense: 10 mL; Refill: 0 - testosterone cypionate (DEPOTESTOSTERONE CYPIONATE) injection 200 mg; Inject 1 mL (200 mg total) into the muscle once.  3. Irritative voiding symptoms:   Patient has not had a recent cystoscopy.  His voiding symptoms have been refractory to numerous medication and his PVR's have been below 100 cc.    He will be scheduled for a cystoscopy/urine cytology to rule out CIS.    No Follow-up on file.  Michiel Cowboy, PA-C  [facility] [address], [address] [place], [place] [address] [phone]

## 2015-03-12 ENCOUNTER — Encounter: Payer: Self-pay | Admitting: Urology

## 2015-03-14 ENCOUNTER — Telehealth: Payer: Self-pay

## 2015-03-14 NOTE — Telephone Encounter (Signed)
Please advise 

## 2015-03-14 NOTE — Telephone Encounter (Signed)
What are you wanting advice on for this patient?

## 2015-03-14 NOTE — Telephone Encounter (Signed)
Spoke with pt in reference to side effects of testosterone injections. Reinforced the need to monitoring breast for masses. Pt voiced understanding stating he just wanted to make [facility] aware.

## 2015-03-14 NOTE — Telephone Encounter (Signed)
This is a side effect of the testosterone injections. We do need to keep a close eye on the breast tenderness to make sure no masses are present.

## 2015-03-14 NOTE — Telephone Encounter (Signed)
Since my first shot of the Testoserone shot both of my nipples and the [place] area is very sensitive and at times very hard. [facility] to [facility] , Weight loss and stamina and I need to know if this is ok and do I need to do anything differently.   Please advise and do I have a followup visit scheduled yet?   This is the pt message

## 2015-03-14 NOTE — Telephone Encounter (Signed)
Yes please

## 2015-03-16 DIAGNOSIS — L400 Psoriasis vulgaris: Secondary | ICD-10-CM | POA: Diagnosis not present

## 2015-03-17 DIAGNOSIS — I480 Paroxysmal atrial fibrillation: Secondary | ICD-10-CM

## 2015-03-17 HISTORY — DX: Paroxysmal atrial fibrillation: I48.0

## 2015-03-22 ENCOUNTER — Ambulatory Visit (INDEPENDENT_AMBULATORY_CARE_PROVIDER_SITE_OTHER): Payer: Medicare Other | Admitting: Internal Medicine

## 2015-03-22 ENCOUNTER — Encounter: Payer: Self-pay | Admitting: Internal Medicine

## 2015-03-22 VITALS — BP 105/66 | HR 78 | Temp 97.8°F | Ht 70.0 in | Wt 210.5 lb

## 2015-03-22 DIAGNOSIS — E291 Testicular hypofunction: Secondary | ICD-10-CM

## 2015-03-22 DIAGNOSIS — L309 Dermatitis, unspecified: Secondary | ICD-10-CM | POA: Diagnosis not present

## 2015-03-22 DIAGNOSIS — I2510 Atherosclerotic heart disease of native coronary artery without angina pectoris: Secondary | ICD-10-CM

## 2015-03-22 DIAGNOSIS — Z23 Encounter for immunization: Secondary | ICD-10-CM

## 2015-03-22 DIAGNOSIS — I10 Essential (primary) hypertension: Secondary | ICD-10-CM

## 2015-03-22 DIAGNOSIS — R079 Chest pain, unspecified: Secondary | ICD-10-CM | POA: Diagnosis not present

## 2015-03-22 NOTE — Assessment & Plan Note (Signed)
Substernal chest pain and left upper arm pain with exertion. Concerning for CAD. EKG today shows no acute changes. Will set up follow up with cardiology. Question if he might benefit from repeat stress test and /or coronary artery calcium scoring. Continue statin, plavix.

## 2015-03-22 NOTE — Assessment & Plan Note (Signed)
Awaiting endocrine evaluation later this month given low testosterone, and elevated FSH/LH.

## 2015-03-22 NOTE — Assessment & Plan Note (Signed)
Reviewed notes from dermatology. Continue prn Clobetasol.

## 2015-03-22 NOTE — Assessment & Plan Note (Signed)
BP Readings from Last 3 Encounters:  03/22/15 105/66  03/04/15 112/60  03/02/15 102/62   BP well controlled. Continue current medications.

## 2015-03-22 NOTE — Progress Notes (Signed)
Pre visit review using our clinic review tool, if applicable. No additional management support is needed unless otherwise documented below in the visit note. 

## 2015-03-22 NOTE — Patient Instructions (Addendum)
Look into TDAP at either [facility] or the health department.  We will set up evaluation with cardiology.

## 2015-03-22 NOTE — Progress Notes (Signed)
Subjective:    Patient ID: [name], male    DOB: 1946/04/16, 69 y.o.   MRN: [id number]  HPI  68YO male presents for follow up.  Last seen 8/19 for dermatitis and hypogonadism. Evaluation with endocrine scheduled for 9/30.  Seen by Dr. Gwen Pounds in Dermatology. Instructed to take allergy pill, Allegra. Told to start Nizoral 1% shampoo 5 times per week. No real change with medication.   Had blephroplasty in the past. Now having some recurrent symptoms of interference in vision in left eye. Has evaluation set up with ENT.  With minimal exertion, such as walking uphill for short period, had substernal chest burning and left upper arm pain. This has been ongoing for last few months. Also notes dyspnea during episodes. No NV. Symptoms resolve after a few min of rest. If symptoms occur outside of exertion, he takes up to 3 Tums with some improvement.  Past Medical History  Diagnosis Date  . Coronary artery disease   . Peripheral vascular disease   . Hyperlipidemia   . Diabetes mellitus   . SOB (shortness of breath)   . Muscle ache   . Muscle pain   . Ischemic leg   . Weakness   . Erectile dysfunction   . Hypertension   . Hypogonadism in male   . Enlarged prostate   . Arthritis   . Depression   . History of kidney stones    Family History  Problem Relation Age of Onset  . Coronary artery disease Father   . Heart disease Father   . Stroke Mother   . Stroke Sister   . Prostate cancer Neg Hx   . Kidney disease Neg Hx    Past Surgical History  Procedure Laterality Date  . Cardiac catheterization  1992  . Ventral hernia repair  1994    complicated by infection  . Cholecystectomy  2000    also had revision of hernia repair  . Cystectomy      face and scrotum  . Blepharoplasty     Social History   Social History  . Marital Status: Married    Spouse Name: N/A  . Number of Children: N/A  . Years of Education: N/A   Social History Main Topics  . Smoking status:  Former Smoker -- 2.00 packs/day for 40 years    Types: Cigarettes    Quit date: 10/17/1992  . Smokeless tobacco: None     Comment: quit 1994  . Alcohol Use: 0.0 oz/week    0 Standard drinks or equivalent per week     Comment: seldom  . Drug Use: No  . Sexual Activity: Not Asked   Other Topics Concern  . None   Social History Narrative   Lives in [place] with wife. No pets      Two children.      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      Hobbies - reading, drives Corvette, chuch    Review of Systems  Constitutional: Negative for fever, chills, activity change, appetite change, fatigue and unexpected weight change.  Eyes: Negative for visual disturbance.  Respiratory: Positive for shortness of breath. Negative for cough, chest tightness and wheezing.   Cardiovascular: Positive for chest pain. Negative for palpitations and leg swelling.  Gastrointestinal: Negative for nausea, vomiting, abdominal pain, diarrhea, constipation and abdominal distention.  Genitourinary: Negative for dysuria, urgency and difficulty urinating.  Musculoskeletal: Negative for arthralgias  and gait problem.  Skin: Negative for color change and rash.  Hematological: Negative for adenopathy.  Psychiatric/Behavioral: Negative for suicidal ideas, sleep disturbance and dysphoric mood. The patient is not nervous/anxious.        Objective:    BP 105/66 mmHg  Pulse 78  Temp(Src) 97.8 F (36.6 C) (Oral)  Ht  (1.778 m)  Wt 210 lb 8 oz (95.482 kg)  BMI 30.20 kg/m2  SpO2 96% Physical Exam  Constitutional: He is oriented to person, place, and time. He appears well-developed and well-nourished. No distress.  HENT:  Head: Normocephalic and atraumatic.  Right Ear: External ear normal.  Left Ear: External ear normal.  Nose: Nose normal.  Mouth/Throat: Oropharynx is clear and moist. No oropharyngeal exudate.  Eyes:  Conjunctivae and EOM are normal. Pupils are equal, round, and reactive to light. Right eye exhibits no discharge. Left eye exhibits no discharge. No scleral icterus.  Neck: Normal range of motion. Neck supple. No tracheal deviation present. No thyromegaly present.  Cardiovascular: Normal rate, regular rhythm and normal heart sounds.  Exam reveals no gallop and no friction rub.   No murmur heard. Pulmonary/Chest: Effort normal and breath sounds normal. No accessory muscle usage. No tachypnea. No respiratory distress. He has no decreased breath sounds. He has no wheezes. He has no rhonchi. He has no rales. He exhibits no tenderness.  Musculoskeletal: Normal range of motion. He exhibits no edema.  Lymphadenopathy:    He has no cervical adenopathy.  Neurological: He is alert and oriented to person, place, and time. No cranial nerve deficit. Coordination normal.  Skin: Skin is warm and dry. No rash noted. He is not diaphoretic. No erythema. No pallor.  Psychiatric: He has a normal mood and affect. His behavior is normal. Judgment and thought content normal.          Assessment & Plan:   Problem List Items Addressed This Visit      Unprioritized   Chest pain - Primary    Substernal chest pain and left upper arm pain with exertion. Concerning for CAD. EKG today shows no acute changes. Will set up follow up with cardiology. Question if he might benefit from repeat stress test and /or coronary artery calcium scoring. Continue statin, plavix.      Relevant Orders   EKG 12-Lead (Completed)   Dermatitis    Reviewed notes from dermatology. Continue prn Clobetasol.      HTN (hypertension)    BP Readings from Last 3 Encounters:  03/22/15 105/66  03/04/15 112/60  03/02/15 102/62   BP well controlled. Continue current medications.      Hypogonadism male    Awaiting endocrine evaluation later this month given low testosterone, and elevated FSH/LH.          Return in about 4 weeks  (around 04/19/2015) for Recheck.

## 2015-03-23 ENCOUNTER — Ambulatory Visit (INDEPENDENT_AMBULATORY_CARE_PROVIDER_SITE_OTHER): Payer: Medicare Other | Admitting: Cardiology

## 2015-03-23 ENCOUNTER — Other Ambulatory Visit
Admission: RE | Admit: 2015-03-23 | Discharge: 2015-03-23 | Disposition: A | Discharge status: Home / Self Care | Payer: Medicare Other | Source: Ambulatory Visit | Attending: Cardiology | Admitting: Cardiology

## 2015-03-23 ENCOUNTER — Encounter: Payer: Self-pay | Admitting: Cardiology

## 2015-03-23 ENCOUNTER — Ambulatory Visit
Admission: RE | Admit: 2015-03-23 | Discharge: 2015-03-23 | Disposition: A | Discharge status: Home / Self Care | Payer: Medicare Other | Source: Ambulatory Visit | Attending: Cardiology | Admitting: Cardiology

## 2015-03-23 VITALS — BP 106/66 | HR 76 | Ht 69.0 in | Wt 209.2 lb

## 2015-03-23 DIAGNOSIS — I25119 Atherosclerotic heart disease of native coronary artery with unspecified angina pectoris: Secondary | ICD-10-CM

## 2015-03-23 DIAGNOSIS — I700 Atherosclerosis of aorta: Secondary | ICD-10-CM

## 2015-03-23 DIAGNOSIS — I200 Unstable angina: Secondary | ICD-10-CM | POA: Diagnosis not present

## 2015-03-23 DIAGNOSIS — I739 Peripheral vascular disease, unspecified: Secondary | ICD-10-CM

## 2015-03-23 DIAGNOSIS — R079 Chest pain, unspecified: Secondary | ICD-10-CM

## 2015-03-23 DIAGNOSIS — Z01812 Encounter for preprocedural laboratory examination: Secondary | ICD-10-CM

## 2015-03-23 DIAGNOSIS — Z01818 Encounter for other preprocedural examination: Secondary | ICD-10-CM | POA: Diagnosis not present

## 2015-03-23 DIAGNOSIS — Z8679 Personal history of other diseases of the circulatory system: Secondary | ICD-10-CM | POA: Diagnosis not present

## 2015-03-23 DIAGNOSIS — I2510 Atherosclerotic heart disease of native coronary artery without angina pectoris: Secondary | ICD-10-CM | POA: Insufficient documentation

## 2015-03-23 DIAGNOSIS — I10 Essential (primary) hypertension: Secondary | ICD-10-CM | POA: Diagnosis not present

## 2015-03-23 DIAGNOSIS — E291 Testicular hypofunction: Secondary | ICD-10-CM

## 2015-03-23 LAB — PROTIME-INR
INR: 0.94
Prothrombin Time: 12.8 seconds (ref 11.4–15.0)

## 2015-03-23 LAB — BASIC METABOLIC PANEL
Anion gap: 5 (ref 5–15)
BUN: 22 mg/dL — ABNORMAL HIGH (ref 6–20)
CO2: 27 mmol/L (ref 22–32)
Calcium: 8.8 mg/dL — ABNORMAL LOW (ref 8.9–10.3)
Chloride: 107 mmol/L (ref 101–111)
Creatinine, Ser: 1.13 mg/dL (ref 0.61–1.24)
GFR calc Af Amer: >60 mL/min (ref >60)
GFR calc non Af Amer: >60 mL/min (ref >60)
Glucose, Bld: 117 mg/dL — ABNORMAL HIGH (ref 65–99)
Potassium: 3.8 mmol/L (ref 3.5–5.1)
Sodium: 139 mmol/L (ref 135–145)

## 2015-03-23 LAB — CBC WITH DIFFERENTIAL/PLATELET
Basophils Absolute: 0.1 10*3/uL (ref 0–0.1)
Basophils Relative: 1 %
Eosinophils Absolute: 0.3 10*3/uL (ref 0–0.7)
Eosinophils Relative: 3 %
HCT: 41.4 % (ref 40.0–52.0)
Hemoglobin: 14.3 g/dL (ref 13.0–18.0)
Lymphocytes Relative: 25 %
Lymphs Abs: 2.4 10*3/uL (ref 1.0–3.6)
MCH: 33.6 pg (ref 26.0–34.0)
MCHC: 34.6 g/dL (ref 32.0–36.0)
MCV: 97 fL (ref 80.0–100.0)
Monocytes Absolute: 1.3 10*3/uL — ABNORMAL HIGH (ref 0.2–1.0)
Monocytes Relative: 13 %
Neutro Abs: 5.4 10*3/uL (ref 1.4–6.5)
Neutrophils Relative %: 58 %
Platelets: 169 10*3/uL (ref 150–440)
RBC: 4.27 MIL/uL — ABNORMAL LOW (ref 4.40–5.90)
RDW: 12.7 % (ref 11.5–14.5)
WBC: 9.4 10*3/uL (ref 3.8–10.6)

## 2015-03-23 MED ORDER — NITROGLYCERIN 0.4 MG SL SUBL: 0.4000 mg | SUBLINGUAL_TABLET | SUBLINGUAL | Status: DC | PRN

## 2015-03-23 NOTE — Assessment & Plan Note (Signed)
Stable blood pressure readings. He is only on low-dose beta blocker

## 2015-03-23 NOTE — Assessment & Plan Note (Signed)
He does have some relatively classic features for exertional angina with a burning tightness in his chest happening mostly with exertion now with less exertion than before. It is associated with dyspnea and there is potential radiation to the left arm. He has significant risk factors as mentioned.  Plan: Left Heart Catheterization with Coronary Angiography Plus Minus PCI.  AUC: CAD Assessment (Coronary Angiography With or Without Left Heart Catheterization and/or Left Ventriculography)  Patient Information:    Suspected CAD (No Prior PCI, No Prior CABG, and No Prior Angiogram Showing >= 50% Angiographic Stenosis)   No Prior Noninvasive Testing   Symptomatic   High Pretest Probability  AUC Score:   A (7)   Indication:   10   The procedure with Risks/Benefits/Alternatives and Indications was reviewed with the patient.  All questions were answered.    Risks / Complications include, but not limited to: Death, MI, CVA/TIA, VF/VT (with defibrillation), Bradycardia (need for temporary pacer placement), contrast induced nephropathy, bleeding / bruising / hematoma / pseudoaneurysm, vascular or coronary injury (with possible emergent CT or Vascular Surgery), adverse medication reactions, infection.  Additional risks involving the use of radiation with the possibility of radiation burns and cancer were explained in detail.  The patient voices understanding and agree to proceed.

## 2015-03-23 NOTE — Assessment & Plan Note (Addendum)
I don't know where this diagnosis comes from, he clearly CAD risk equivalents with PAD, hyperlipidemia, prediabetes and hypertension.  On statin, aspirin plus Plavix and beta blocker. With concerning symptoms for angina will provide when necessary nitroglycerin

## 2015-03-23 NOTE — Patient Instructions (Addendum)
Medication Instructions:  Your physician has recommended you make the following change in your medication:  TAKE nitroglycerin one tablet as needed for chest pain   Labwork: Your physician recommends that you have labs today: BMET, CBC, PT/INR   Testing/Procedures: A chest x-ray takes a picture of the organs and structures inside the chest, including the heart, lungs, and blood vessels. This test can show several things, including, whether the heart is enlarges; whether fluid is building up in the lungs; and whether pacemaker / defibrillator leads are still in place.  Your physician has requested that you have a cardiac catheterization. Cardiac catheterization is used to diagnose and/or treat various heart conditions. Doctors may recommend this procedure for a number of different reasons. The most common reason is to evaluate chest pain. Chest pain can be a symptom of coronary artery disease (CAD), and cardiac catheterization can show whether plaque is narrowing or blocking your heart's arteries. This procedure is also used to evaluate the valves, as well as measure the blood flow and oxygen levels in different parts of your heart. For further information please visit [email or web address] Please follow instruction sheet, as given.  [facility] Cardiac Cath Instructions   You are scheduled for a Cardiac Cath on: Thursday, September15, 7:30am  Please arrive at 5:30am on the day of your procedure  Do not eat/drink anything after midnight  Someone will need to drive you home  It is recommended someone be with you for the first 24 hours after your procedure  Wear clothes that are easy to get on/off and wear slip on shoes if possible   Medications bring a current list of all medications with you  _xx__ You may take all of your medications the morning of your procedure with enough water to swallow safely   Day of your procedure: [facility] [address] [place] [phone]  The usual length of stay after your procedure is about 2 to 3 hours.  This can vary.  If you have any questions, please call our office at [phone], or you may call the cardiac cath lab at [facility] directly at [phone]  Follow-Up: Your physician recommends that you schedule a follow-up appointment in: 2-3 weeks with Dr. [name] or Dr. Elease Hashimoto   Angiogram An angiogram, also called angiography, is a procedure used to look at the blood vessels that carry blood to different parts of your body (arteries). In this procedure, dye is injected through a long, thin tube (catheter) into an artery. X-rays are then taken. The X-rays will show if there is a blockage or problem in a blood vessel.  LET [facility] CARE PROVIDER KNOW ABOUT:  Any allergies you have, including allergies to shellfish or contrast dye.   All medicines you are taking, including vitamins, herbs, eye drops, creams, and over-the-counter medicines.   Previous problems you or members of your family have had with the use of anesthetics.   Any blood disorders you have.   Previous surgeries you have had.  Any previous kidney problems or failure you have had.  Medical conditions you have.   Possibility of pregnancy, if this applies. RISKS AND COMPLICATIONS Generally, an angiogram is a safe procedure. However, as with any procedure, problems can occur. Possible problems include:  Injury to the blood vessels, including rupture or bleeding.  Infection or bruising at the catheter site.  Allergic reaction to the dye or contrast used.  Kidney damage from the dye or contrast used.  Blood clots that  can lead to a stroke or heart attack. BEFORE THE PROCEDURE  Do not eat or drink after midnight on the night before the procedure, or as directed by your health care provider.   Ask your health care provider if you may drink enough water to take any needed medicines the morning of the  procedure.  PROCEDURE  You may be given a medicine to help you relax (sedative) before and during the procedure. This medicine is given through an IV access tube that is inserted into one of your veins.   The area where the catheter will be inserted will be washed and shaved. This is usually done in the groin but may be done in the fold of your arm (near your elbow) or in the wrist.  A medicine will be given to numb the area where the catheter will be inserted (local anesthetic).  The catheter will be inserted with a guide wire into an artery. The catheter is guided by using a type of X-ray (fluoroscopy) to the blood vessel being examined.   Dye is then injected into the catheter, and X-rays are taken. The dye helps to show where any narrowing or blockages are located.  AFTER THE PROCEDURE   If the procedure is done through the leg, you will be kept in bed lying flat for several hours. You will be instructed to not bend or cross your legs.  The insertion site will be checked frequently.  The pulse in your feet or wrist will be checked frequently.  Additional blood tests, X-rays, and electrocardiography may be done.   You may need to stay in the hospital overnight for observation.  Document Released: 04/11/2005 Document Revised: 07/07/2013 Document Reviewed: 12/03/2012 [facility] Patient Information 2015 [place], [place]. This information is not intended to replace advice given to you by your health care provider. Make sure you discuss any questions you have with your health care provider.

## 2015-03-23 NOTE — Assessment & Plan Note (Signed)
Plan Abdominal Aortic Angiography at the time of cardiac catheterization to confirm or deny aortic occlusion versus severe stenosis

## 2015-03-23 NOTE — Progress Notes (Signed)
PATIENT: [name] MRN: [id number] DOB: 1946-03-08 PCP: [name], MD  Clinic Note: Chief Complaint  Patient presents with  . other    C/o chest pain with burning sensation and left arm pain. Meds reviewed verbally with pt.    HPI: [name] is a 69 y.o. male with a PMH below who presents today for urgent evaluation of exertional dyspnea and chest discomfort along with left arm discomfort. He is a patient of Dr. Kristeen Miss who is now reestablished with [facility] after having moved back to the [place] area from [place] earlier this spring. In 2010 he had a Myoview stress test (converted to Lexiscan from treadmill due to inability to reach target heart rate) --the study was normal with no evidence of ischemia and normal LV function. EF 72%.   Seen by Dr. Elease Hashimoto for initial visit in May of this year, and really was not having much the way of symptoms. He was referred back to Dr. Betti Cruz based on his history of an occluded distal aorta. At that time he noted some mild exertional dyspnea. He saw Dr. Hart Rochester in late June and noted stable claudication in both lower surgeries with the right being greater than left. Apparently the "occluded aorta "came from attempted catheterization procedure via the right groin. He said the past he had a left brachial approach for catheterization done. He apparently was able to ambulate about a quarter mile before having to stop due to his claudication. He had ABIs checked that showed bilateral ABIs of 0.8 indicating mild arterial occlusion.  Interval History: [name] presents today with basically what he describes as a "haphazard /sporadic " complaints of exertional chest burning/tightness associated with pain in the left arm. This is been going on for a couple years but is now becoming more frequent over the last month or 2. He notes that it is worse when doing any walking up an incline. He says oftentimes the discomfort will last  about a minute or so and he will stop and is relieved with rest. It is associated with exertional shortness of breath.   He is concerned, because over the last couple weeks he is also noticing this can happen with walking on flat ground for instance from the grocery store parking lot. For the most part the episodes don't last more than about a minute, but he has had some but occur for about 20-30 minutes. He says that these episodes are coronary more frequently than he had been for and are associated more exertional dyspnea and prolonged discomfort. He has started to notice it sometimes with minimal activity especially (at night when he gets up to go to the bathroom simply walking to the bathroom constantly quite short of breath and discomfort. He is a hard time quantifying discomfort would say that at its worst is probably 6 out of 10. At baseline prior to this he would June 2010 walk on treadmill at the 3.0-3.2 rate for maybe about half a mile in addition to some other exercises. However if he increases his level of exertion he does note the discomfort. He was told the past he has an irregular heartbeat, but he denies any significant rapid or irregular heartbeat/palpitations.   He has mild stable lower extremity edema.  The remainder of cardiac review of systems is as follows: Cardiovascular ROS: positive for - chest pain, dyspnea on exertion, edema and irregular heartbeat negative for - loss of consciousness, murmur, orthopnea, palpitations, paroxysmal nocturnal dyspnea, rapid  heart rate, shortness of breath or TIA/amaurosis fugax, syncope/near syncope : He does have easy bruising from being on long-term Plavix for PAD, but has not had any problems with known, hematochezia, hematuria, or epistaxis.  Past Medical History  Diagnosis Date  . Coronary artery disease     By report  . Peripheral vascular disease     Bilateral ABIs 0.8  . Chronic distal aortic occlusion     By report  . Diabetes  mellitus     By his report, this is prediabetes   . SOB (shortness of breath)   . Hyperlipidemia LDL goal <70   . Essential hypertension   . Weakness   . Erectile dysfunction   . Hypogonadism in male     Previously on testosterone injections, but has not yet restarted until further testing  . Enlarged prostate   . Arthritis   . Depression   . History of kidney stones   . Muscle pain     Prior Cardiac Evaluation and Past Surgical History: Past Surgical History  Procedure Laterality Date  . Cardiac catheterization  1992  . Ventral hernia repair  1994    complicated by infection  . Cholecystectomy  2000    also had revision of hernia repair  . Cystectomy      face and scrotum  . Blepharoplasty    . Lexiscan myoview  June 2010    converted to [facility] from treadmill due to inability to reach target heart rate) --the study was normal with no evidence of ischemia and normal LV function. EF 7    studies reviewed: Lexiscan Myoview from June 2010, ABIs from June 2016   Allergies  Allergen Reactions  . Fenofibrate   . Lipitor [Atorvastatin Calcium]     Current Outpatient Prescriptions  Medication Sig Dispense Refill  . acetaminophen (TYLENOL) 650 MG CR tablet Take 650 mg by mouth every 8 (eight) hours as needed.    . clobetasol ointment (TEMOVATE) 0.05 % Apply 1 application topically 2 (two) times daily. 30 g 0  . clopidogrel (PLAVIX) 75 MG tablet Take 1 tablet (75 mg total) by mouth daily. 90 tablet 3  . fexofenadine (ALLEGRA) 180 MG tablet Take 180 mg by mouth daily.    . finasteride (PROSCAR) 5 MG tablet Take 1 tablet (5 mg total) by mouth daily. 90 tablet 3  . gabapentin (NEURONTIN) 400 MG capsule Take 1 capsule (400 mg total) by mouth 3 (three) times daily. 270 capsule 3  . KETOCONAZOLE, TOPICAL, 1 % SHAM Apply topically.    . metoprolol succinate (TOPROL XL) 25 MG 24 hr tablet Take 1 tablet (25 mg total) by mouth daily. 90 tablet 3  . Multiple Vitamin (MULTIVITAMIN) tablet  Take 1 tablet by mouth daily.    . naproxen sodium (ANAPROX) 220 MG tablet Take 220 mg by mouth 2 (two) times daily with a meal.    . simvastatin (ZOCOR) 20 MG tablet Take 1 tablet (20 mg total) by mouth daily. 90 tablet 3  . testosterone cypionate (DEPOTESTOSTERONE CYPIONATE) 200 MG/ML injection Inject 1 mL (200 mg total) into the muscle every 14 (fourteen) days. 10 mL 0  . nitroGLYCERIN (NITROSTAT) 0.4 MG SL tablet Place 1 tablet (0.4 mg total) under the tongue every 5 (five) minutes as needed for chest pain. 25 tablet 0   No current facility-administered medications for this visit.    Social History   Social History Narrative   Lives in [place] with wife. No pets  Two children.      Former smoker. Quit in 1994. Prior to quitting was unable to walk without exertional dyspnea.      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      Hobbies - reading, drives Corvette, chuch    family history includes Coronary artery disease in his father; Heart disease in his father; Stroke in his mother and sister. There is no history of Prostate cancer or Kidney disease.  ROS: A comprehensive Review of Systems - was performed Review of Systems  Constitutional: Positive for malaise/fatigue.  Eyes: Negative for blurred vision.  Cardiovascular: Positive for claudication (Stable, nonlimiting).  Gastrointestinal: Negative for blood in stool and melena.  Genitourinary: Negative for hematuria.  Musculoskeletal: Positive for myalgias and joint pain.  Neurological: Negative for dizziness, focal weakness and headaches.  Endo/Heme/Allergies: Bruises/bleeds easily.  Psychiatric/Behavioral: Negative for depression. The patient is not nervous/anxious.   All other systems reviewed and are negative.  Wt Readings from Last 3 Encounters:  03/23/15 209 lb 4 oz (94.915 kg)  03/22/15 210 lb 8 oz (95.482 kg)  03/04/15 206 lb 9.6 oz (93.713 kg)     PHYSICAL EXAM BP 106/66 mmHg  Pulse 76  Ht 5\' 9"  (1.753 m)  Wt 209 lb 4 oz (94.915 kg)  BMI 30.89 kg/m2 General appearance: alert, cooperative, appears stated age, no distress, mildly obese and (Truncal obesity). Answers questions properly. HEENT: [place]/AT, EOMI, MMM, anicteric sclera - mild left eye ptosis (had a history of blepharoplasty) Neck: no adenopathy, no carotid bruit, no JVD and supple, symmetrical, trachea midline Lungs: clear to auscultation bilaterally, normal percussion bilaterally and Nonlabored, good air movement Heart: RRR with normal S1 and S2. Occasional ectopy. No M/R/G. Abdomen: soft, non-tender; bowel sounds normal; no masses,  no organomegaly and Truncal obesity Extremities: edema 1+ pitting, no ulcers, gangrene or trophic changes, venous stasis dermatitis noted and Distal rubor without calor. Faint pedal pulses - barely palpable Pulses: 1+ bilateral radial with a barely palpable DP/PT, 2+ femoral with + bruit Skin: Distal foot/leg rubor with mild stasis changes. No evidence of cellulitis Neurologic: Mental status: Alert, oriented, thought content appropriate, affect: normal Cranial nerves: Grossly normal    Adult ECG Report  Rate: 76 ;  Rhythm: normal sinus rhythm and Borderline short PR.  QRS Axis: 24 ;  PR Interval: 112 (borderline short) ;  QRS Duration: 96 ; QTc: 372;  Voltages: Borderline low in precordial leads   Narrative Interpretation: otherwise normal EKG  Recent Labs:    Chemistry      Component Value Date/Time   NA 139 03/23/2015 1641   K 3.8 03/23/2015 1641   CL 107 03/23/2015 1641   CO2 27 03/23/2015 1641   BUN 22* 03/23/2015 1641   CREATININE 1.13 03/23/2015 1641      Component Value Date/Time   CALCIUM 8.8* 03/23/2015 1641   ALKPHOS 49 12/17/2014 1442   AST 16 12/17/2014 1442   ALT 20 12/17/2014 1442   BILITOT 0.4 12/17/2014 1442     No results found for: CHOL, HDL, LDLCALC, LDLDIRECT, TRIG, CHOLHDL   ASSESSMENT /  PLAN: 69 year old gentleman with significant cardiac risk factors including PAD (aortic occlusion), prediabetes, hypertension (treated), hyperlipidemia and former smoker who now presents with symptoms of progressive exertional chest tightness/burning with dyspnea. Symptoms are concerning for progressive angina from class II to class III. Given his pretest probability for CAD, I concerned that a noninvasive study  would not satisfy concerns for this being angina. I think the best course of action is to proceed with invasive evaluation with cardiac catheterization.  Problem List Items Addressed This Visit    Aortic atherosclerosis (Chronic)    Plan Abdominal Aortic Angiography at the time of cardiac catheterization to confirm or deny aortic occlusion versus severe stenosis      Relevant Medications   nitroGLYCERIN (NITROSTAT) 0.4 MG SL tablet   CAD (coronary artery disease) (Chronic)    I don't know where this diagnosis comes from, he clearly CAD risk equivalents with PAD, hyperlipidemia, prediabetes and hypertension.  On statin, aspirin plus Plavix and beta blocker. With concerning symptoms for angina will provide when necessary nitroglycerin      Relevant Medications   nitroGLYCERIN (NITROSTAT) 0.4 MG SL tablet   Chest pain - Primary   Relevant Medications   nitroGLYCERIN (NITROSTAT) 0.4 MG SL tablet   Essential hypertension (Chronic)    Stable blood pressure readings. He is only on low-dose beta blocker      Relevant Medications   nitroGLYCERIN (NITROSTAT) 0.4 MG SL tablet   Hypogonadism male    Has an endocrinology evaluation pending. Would be reluctant to aggressively treat low testosterone for fear of potential cardiovascular complication.      PAD (peripheral artery disease) (Chronic)    Followed by Dr. Hart Rochester. ABIs recently recorded. He is on statin and Plavix already.  He is not having claudication As there is concern for possible aortic occlusion, I may consider Abdominal  Aortic Angiography at the time of cardiac catheterization. I discussed this with him as well.      Relevant Medications   nitroGLYCERIN (NITROSTAT) 0.4 MG SL tablet   Progressive angina: Class III     He does have some relatively classic features for exertional angina with a burning tightness in his chest happening mostly with exertion now with less exertion than before. It is associated with dyspnea and there is potential radiation to the left arm. He has significant risk factors as mentioned.  Plan: Left Heart Catheterization with Coronary Angiography Plus Minus PCI.  AUC: CAD Assessment (Coronary Angiography With or Without Left Heart Catheterization and/or Left Ventriculography)  Patient Information:    Suspected CAD (No Prior PCI, No Prior CABG, and No Prior Angiogram Showing >= 50% Angiographic Stenosis)   No Prior Noninvasive Testing   Symptomatic   High Pretest Probability  AUC Score:   A (7)   Indication:   10   The procedure with Risks/Benefits/Alternatives and Indications was reviewed with the patient.  All questions were answered.    Risks / Complications include, but not limited to: Death, MI, CVA/TIA, VF/VT (with defibrillation), Bradycardia (need for temporary pacer placement), contrast induced nephropathy, bleeding / bruising / hematoma / pseudoaneurysm, vascular or coronary injury (with possible emergent CT or Vascular Surgery), adverse medication reactions, infection.  Additional risks involving the use of radiation with the possibility of radiation burns and cancer were explained in detail.  The patient voices understanding and agree to proceed.          Relevant Medications   nitroGLYCERIN (NITROSTAT) 0.4 MG SL tablet   Other Relevant Orders   EKG 12-Lead (Completed)    Other Visit Diagnoses    Pre-procedure lab exam        Relevant Orders    CBC    Basic Metabolic Panel (BMET)    INR/PT    DG Chest 2 View (Completed)       Meds  ordered this  encounter  Medications  . nitroGLYCERIN (NITROSTAT) 0.4 MG SL tablet    Sig: Place 1 tablet (0.4 mg total) under the tongue every 5 (five) minutes as needed for chest pain.    Dispense:  25 tablet    Refill:  0      Followup: 1-2 weeks with Dr. Elease Hashimoto  [name] W. [name], M.D., M.S. Interventional Cardiolgy [facility]    AUC for PCI: Ischemic Symptoms? CCS III (Marked limitation of ordinary activity) Anti-ischemic Medical Therapy? Minimal Therapy (1 class of medications) Non-invasive Test Results? No non-invasive testing performed Prior CABG? No Previous CABG   Patient Information:   1-2V CAD, no prox LAD  A (7)  Indication: 20; Score: 7   Patient Information:   1-2V-CAD with DS 50-60% With No FFR, No IVUS  I (3)  Indication: 21; Score: 3   Patient Information:   1-2V-CAD with DS 50-60% With FFR  A (7)  Indication: 22; Score: 7   Patient Information:   1-2V-CAD with DS 50-60% With FFR>0.8, IVUS not significant  I (2)  Indication: 23; Score: 2   Patient Information:   3V-CAD without LMCA With Abnormal LV systolic function  A (9)  Indication: 48; Score: 9   Patient Information:   LMCA-CAD  A (9)  Indication: 49; Score: 9   Patient Information:   2V-CAD with prox LAD PCI  A (7)  Indication: 62; Score: 7   Patient Information:   2V-CAD with prox LAD CABG  A (8)  Indication: 62; Score: 8   Patient Information:   3V-CAD without LMCA With Low CAD burden(i.e., 3 focal stenoses, low SYNTAX score) PCI  A (7)  Indication: 63; Score: 7   Patient Information:   3V-CAD without LMCA With Low CAD burden(i.e., 3 focal stenoses, low SYNTAX score) CABG  A (9)  Indication: 63; Score: 9   Patient Information:   3V-CAD without LMCA E06c - Intermediate-high CAD burden (i.e., multiple diffuse lesions, presence of CTO, or high SYNTAX score) PCI  U (4)  Indication: 64; Score: 4   Patient Information:   3V-CAD without  LMCA E06c - Intermediate-high CAD burden (i.e., multiple diffuse lesions, presence of CTO, or high SYNTAX score) CABG  A (9)  Indication: 64; Score: 9   Patient Information:   LMCA-CAD With Isolated LMCA stenosis  PCI  U (6)  Indication: 65; Score: 6   Patient Information:   LMCA-CAD With Isolated LMCA stenosis  CABG  A (9)  Indication: 65; Score: 9   Patient Information:   LMCA-CAD Additional CAD, low CAD burden (i.e., 1- to 2-vessel additional involvement, low SYNTAX score) PCI  U (5)  Indication: 66; Score: 5   Patient Information:   LMCA-CAD Additional CAD, low CAD burden (i.e., 1- to 2-vessel additional involvement, low SYNTAX score) CABG  A (9)  Indication: 66; Score: 9   Patient Information:   LMCA-CAD Additional CAD, intermediate-high CAD burden (i.e., 3-vessel involvement, presence of CTO, or high SYNTAX score) PCI  I (3)  Indication: 67; Score: 3   Patient Information:   LMCA-CAD Additional CAD, intermediate-high CAD burden (i.e., 3-vessel involvement, presence of CTO, or high SYNTAX score) CABG  A (9)  Indication: 67; Score: 9

## 2015-03-23 NOTE — Assessment & Plan Note (Addendum)
Followed by Dr. Hart Rochester. ABIs recently recorded. He is on statin and Plavix already.  He is not having claudication As there is concern for possible aortic occlusion, I may consider Abdominal Aortic Angiography at the time of cardiac catheterization. I discussed this with him as well.

## 2015-03-23 NOTE — Assessment & Plan Note (Signed)
Has an endocrinology evaluation pending. Would be reluctant to aggressively treat low testosterone for fear of potential cardiovascular complication.

## 2015-03-29 NOTE — Telephone Encounter (Signed)
Patient needs to have a cystoscopy with urine cytology scheduled.

## 2015-03-31 ENCOUNTER — Other Ambulatory Visit: Payer: Self-pay | Admitting: *Deleted

## 2015-03-31 ENCOUNTER — Other Ambulatory Visit (HOSPITAL_COMMUNITY): Payer: Medicare Other

## 2015-03-31 ENCOUNTER — Encounter (HOSPITAL_COMMUNITY)
Admission: RE | Disposition: A | Discharge status: Home Health | Payer: Self-pay | Source: Ambulatory Visit | Attending: Cardiothoracic Surgery

## 2015-03-31 ENCOUNTER — Inpatient Hospital Stay (HOSPITAL_COMMUNITY): Payer: Medicare Other

## 2015-03-31 ENCOUNTER — Inpatient Hospital Stay (HOSPITAL_COMMUNITY)
Admission: RE | Admit: 2015-03-31 | Discharge: 2015-04-11 | DRG: 234 | Disposition: A | Discharge status: Home Health | Payer: Medicare Other | Source: Ambulatory Visit | Attending: Cardiothoracic Surgery | Admitting: Cardiothoracic Surgery

## 2015-03-31 DIAGNOSIS — Z79899 Other long term (current) drug therapy: Secondary | ICD-10-CM

## 2015-03-31 DIAGNOSIS — Z79890 Hormone replacement therapy (postmenopausal): Secondary | ICD-10-CM | POA: Diagnosis not present

## 2015-03-31 DIAGNOSIS — Z951 Presence of aortocoronary bypass graft: Secondary | ICD-10-CM

## 2015-03-31 DIAGNOSIS — Z01810 Encounter for preprocedural cardiovascular examination: Secondary | ICD-10-CM | POA: Diagnosis not present

## 2015-03-31 DIAGNOSIS — Z823 Family history of stroke: Secondary | ICD-10-CM

## 2015-03-31 DIAGNOSIS — I480 Paroxysmal atrial fibrillation: Secondary | ICD-10-CM | POA: Diagnosis not present

## 2015-03-31 DIAGNOSIS — J9811 Atelectasis: Secondary | ICD-10-CM | POA: Diagnosis not present

## 2015-03-31 DIAGNOSIS — I200 Unstable angina: Secondary | ICD-10-CM | POA: Diagnosis present

## 2015-03-31 DIAGNOSIS — D696 Thrombocytopenia, unspecified: Secondary | ICD-10-CM | POA: Diagnosis not present

## 2015-03-31 DIAGNOSIS — I2510 Atherosclerotic heart disease of native coronary artery without angina pectoris: Secondary | ICD-10-CM | POA: Diagnosis not present

## 2015-03-31 DIAGNOSIS — I10 Essential (primary) hypertension: Secondary | ICD-10-CM | POA: Diagnosis not present

## 2015-03-31 DIAGNOSIS — Z87891 Personal history of nicotine dependence: Secondary | ICD-10-CM

## 2015-03-31 DIAGNOSIS — I472 Ventricular tachycardia: Secondary | ICD-10-CM | POA: Diagnosis not present

## 2015-03-31 DIAGNOSIS — Z8249 Family history of ischemic heart disease and other diseases of the circulatory system: Secondary | ICD-10-CM | POA: Diagnosis not present

## 2015-03-31 DIAGNOSIS — K5900 Constipation, unspecified: Secondary | ICD-10-CM | POA: Diagnosis present

## 2015-03-31 DIAGNOSIS — Z9689 Presence of other specified functional implants: Secondary | ICD-10-CM

## 2015-03-31 DIAGNOSIS — D62 Acute posthemorrhagic anemia: Secondary | ICD-10-CM | POA: Diagnosis not present

## 2015-03-31 DIAGNOSIS — Z888 Allergy status to other drugs, medicaments and biological substances status: Secondary | ICD-10-CM

## 2015-03-31 DIAGNOSIS — Z791 Long term (current) use of non-steroidal anti-inflammatories (NSAID): Secondary | ICD-10-CM

## 2015-03-31 DIAGNOSIS — N179 Acute kidney failure, unspecified: Secondary | ICD-10-CM | POA: Diagnosis not present

## 2015-03-31 DIAGNOSIS — R079 Chest pain, unspecified: Secondary | ICD-10-CM | POA: Diagnosis not present

## 2015-03-31 DIAGNOSIS — E291 Testicular hypofunction: Secondary | ICD-10-CM | POA: Diagnosis present

## 2015-03-31 DIAGNOSIS — E785 Hyperlipidemia, unspecified: Secondary | ICD-10-CM | POA: Diagnosis present

## 2015-03-31 DIAGNOSIS — I700 Atherosclerosis of aorta: Secondary | ICD-10-CM | POA: Diagnosis present

## 2015-03-31 DIAGNOSIS — E8770 Fluid overload, unspecified: Secondary | ICD-10-CM | POA: Diagnosis present

## 2015-03-31 DIAGNOSIS — J90 Pleural effusion, not elsewhere classified: Secondary | ICD-10-CM | POA: Diagnosis not present

## 2015-03-31 DIAGNOSIS — I25110 Atherosclerotic heart disease of native coronary artery with unstable angina pectoris: Principal | ICD-10-CM

## 2015-03-31 DIAGNOSIS — Z7902 Long term (current) use of antithrombotics/antiplatelets: Secondary | ICD-10-CM | POA: Diagnosis not present

## 2015-03-31 DIAGNOSIS — Z09 Encounter for follow-up examination after completed treatment for conditions other than malignant neoplasm: Secondary | ICD-10-CM

## 2015-03-31 DIAGNOSIS — E1151 Type 2 diabetes mellitus with diabetic peripheral angiopathy without gangrene: Secondary | ICD-10-CM | POA: Diagnosis present

## 2015-03-31 DIAGNOSIS — I4891 Unspecified atrial fibrillation: Secondary | ICD-10-CM | POA: Diagnosis not present

## 2015-03-31 DIAGNOSIS — N400 Enlarged prostate without lower urinary tract symptoms: Secondary | ICD-10-CM | POA: Diagnosis present

## 2015-03-31 DIAGNOSIS — R0602 Shortness of breath: Secondary | ICD-10-CM | POA: Diagnosis not present

## 2015-03-31 DIAGNOSIS — I25118 Atherosclerotic heart disease of native coronary artery with other forms of angina pectoris: Secondary | ICD-10-CM | POA: Insufficient documentation

## 2015-03-31 DIAGNOSIS — I739 Peripheral vascular disease, unspecified: Secondary | ICD-10-CM | POA: Diagnosis not present

## 2015-03-31 DIAGNOSIS — Z4682 Encounter for fitting and adjustment of non-vascular catheter: Secondary | ICD-10-CM | POA: Diagnosis not present

## 2015-03-31 DIAGNOSIS — M1990 Unspecified osteoarthritis, unspecified site: Secondary | ICD-10-CM | POA: Diagnosis present

## 2015-03-31 HISTORY — PX: CARDIAC CATHETERIZATION: SHX172

## 2015-03-31 LAB — SURGICAL PCR SCREEN
MRSA, PCR: NEGATIVE
Staphylococcus aureus: NEGATIVE

## 2015-03-31 LAB — GLUCOSE, CAPILLARY: Glucose-Capillary: 117 mg/dL — ABNORMAL HIGH (ref 65–99)

## 2015-03-31 SURGERY — LEFT HEART CATH AND CORONARY ANGIOGRAPHY

## 2015-03-31 MED ORDER — SODIUM CHLORIDE 0.9 % IJ SOLN: 3.0000 mL | Freq: Two times a day (BID) | INTRAMUSCULAR | Status: DC

## 2015-03-31 MED ORDER — ASPIRIN 81 MG PO CHEW
CHEWABLE_TABLET | ORAL | Status: AC
  Filled 2015-03-31: qty 1

## 2015-03-31 MED ORDER — AMIODARONE LOAD VIA INFUSION
150.0000 mg | Freq: Once | INTRAVENOUS | Status: AC
  Administered 2015-03-31: 150 mg via INTRAVENOUS
  Filled 2015-03-31: qty 83.34

## 2015-03-31 MED ORDER — SODIUM CHLORIDE 0.9 % IV SOLN
INTRAVENOUS | Status: DC
  Administered 2015-03-31: 07:00:00 via INTRAVENOUS

## 2015-03-31 MED ORDER — METOPROLOL TARTRATE 1 MG/ML IV SOLN
INTRAVENOUS | Status: AC
  Administered 2015-03-31: 5 mg
  Filled 2015-03-31: qty 5

## 2015-03-31 MED ORDER — HEPARIN (PORCINE) IN NACL 2-0.9 UNIT/ML-% IJ SOLN
INTRAMUSCULAR | Status: AC
  Filled 2015-03-31: qty 1000

## 2015-03-31 MED ORDER — HEPARIN SODIUM (PORCINE) 1000 UNIT/ML IJ SOLN
INTRAMUSCULAR | Status: DC | PRN
  Administered 2015-03-31: 4500 [IU] via INTRAVENOUS

## 2015-03-31 MED ORDER — MIDAZOLAM HCL 2 MG/2ML IJ SOLN
INTRAMUSCULAR | Status: DC | PRN
  Administered 2015-03-31: 1 mg via INTRAVENOUS

## 2015-03-31 MED ORDER — SODIUM CHLORIDE 0.9 % IJ SOLN
3.0000 mL | Freq: Two times a day (BID) | INTRAMUSCULAR | Status: DC
  Administered 2015-03-31 – 2015-04-03 (×7): 3 mL via INTRAVENOUS

## 2015-03-31 MED ORDER — VERAPAMIL HCL 2.5 MG/ML IV SOLN
INTRAVENOUS | Status: AC
  Filled 2015-03-31: qty 2

## 2015-03-31 MED ORDER — SODIUM CHLORIDE 0.9 % IV SOLN
250.0000 mL | INTRAVENOUS | Status: DC | PRN
  Administered 2015-03-31: 10 mL/h via INTRAVENOUS

## 2015-03-31 MED ORDER — SODIUM CHLORIDE 0.9 % IV SOLN: 250.0000 mL | INTRAVENOUS | Status: DC | PRN

## 2015-03-31 MED ORDER — ACETAMINOPHEN 325 MG PO TABS
650.0000 mg | ORAL_TABLET | ORAL | Status: DC | PRN
  Administered 2015-04-01 – 2015-04-03 (×6): 650 mg via ORAL
  Filled 2015-03-31 (×6): qty 2

## 2015-03-31 MED ORDER — LIDOCAINE HCL (PF) 1 % IJ SOLN
INTRAMUSCULAR | Status: AC
  Filled 2015-03-31: qty 30

## 2015-03-31 MED ORDER — MIDAZOLAM HCL 2 MG/2ML IJ SOLN
INTRAMUSCULAR | Status: AC
  Filled 2015-03-31: qty 4

## 2015-03-31 MED ORDER — VERAPAMIL HCL 2.5 MG/ML IV SOLN
INTRA_ARTERIAL | Status: DC | PRN
  Administered 2015-03-31: 15 mL via INTRA_ARTERIAL

## 2015-03-31 MED ORDER — FENTANYL CITRATE (PF) 100 MCG/2ML IJ SOLN
INTRAMUSCULAR | Status: DC | PRN
  Administered 2015-03-31: 25 ug via INTRAVENOUS

## 2015-03-31 MED ORDER — AMIODARONE HCL IN DEXTROSE 360-4.14 MG/200ML-% IV SOLN
30.0000 mg/h | INTRAVENOUS | Status: DC
Start: 2015-03-31 — End: 2015-04-02
  Administered 2015-03-31 – 2015-04-01 (×3): 30 mg/h via INTRAVENOUS
  Filled 2015-03-31 (×3): qty 200

## 2015-03-31 MED ORDER — NITROGLYCERIN 1 MG/10 ML FOR IR/CATH LAB
INTRA_ARTERIAL | Status: AC
  Filled 2015-03-31: qty 10

## 2015-03-31 MED ORDER — ASPIRIN 81 MG PO CHEW
81.0000 mg | CHEWABLE_TABLET | ORAL | Status: AC
  Administered 2015-03-31: 81 mg via ORAL

## 2015-03-31 MED ORDER — MORPHINE SULFATE (PF) 2 MG/ML IV SOLN
2.0000 mg | INTRAVENOUS | Status: DC | PRN
  Administered 2015-04-02 – 2015-04-03 (×3): 2 mg via INTRAVENOUS
  Filled 2015-03-31 (×3): qty 1

## 2015-03-31 MED ORDER — ZOLPIDEM TARTRATE 5 MG PO TABS
5.0000 mg | ORAL_TABLET | Freq: Every evening | ORAL | Status: DC | PRN
Start: 2015-03-31 — End: 2015-04-04
  Administered 2015-03-31 – 2015-04-03 (×4): 5 mg via ORAL
  Filled 2015-03-31 (×4): qty 1

## 2015-03-31 MED ORDER — FENTANYL CITRATE (PF) 100 MCG/2ML IJ SOLN
INTRAMUSCULAR | Status: AC
  Filled 2015-03-31: qty 4

## 2015-03-31 MED ORDER — SODIUM CHLORIDE 0.9 % IJ SOLN: 3.0000 mL | INTRAMUSCULAR | Status: DC | PRN

## 2015-03-31 MED ORDER — HEPARIN SODIUM (PORCINE) 1000 UNIT/ML IJ SOLN
INTRAMUSCULAR | Status: AC
  Filled 2015-03-31: qty 1

## 2015-03-31 MED ORDER — IOHEXOL 350 MG/ML SOLN
INTRAVENOUS | Status: DC | PRN
  Administered 2015-03-31: 90 mL via INTRAVENOUS

## 2015-03-31 MED ORDER — HEPARIN (PORCINE) IN NACL 100-0.45 UNIT/ML-% IJ SOLN
1200.0000 [IU]/h | INTRAMUSCULAR | Status: DC
  Administered 2015-03-31: 1200 [IU]/h via INTRAVENOUS
  Administered 2015-04-01: 1400 [IU]/h via INTRAVENOUS
  Filled 2015-03-31 (×4): qty 250

## 2015-03-31 MED ORDER — ONDANSETRON HCL 4 MG/2ML IJ SOLN
4.0000 mg | Freq: Four times a day (QID) | INTRAMUSCULAR | Status: DC | PRN
  Administered 2015-04-01 – 2015-04-03 (×3): 4 mg via INTRAVENOUS
  Filled 2015-03-31 (×3): qty 2

## 2015-03-31 MED ORDER — SODIUM CHLORIDE 0.9 % WEIGHT BASED INFUSION: 3.0000 mL/kg/h | INTRAVENOUS | Status: AC

## 2015-03-31 MED ORDER — AMIODARONE HCL IN DEXTROSE 360-4.14 MG/200ML-% IV SOLN
60.0000 mg/h | INTRAVENOUS | Status: AC
  Administered 2015-03-31 (×2): 60 mg/h via INTRAVENOUS
  Filled 2015-03-31 (×2): qty 200

## 2015-03-31 SURGICAL SUPPLY — 11 items
CATH INFINITI 5 FR JL3.5 (CATHETERS) ×1 IMPLANT
CATH INFINITI 5FR ANG PIGTAIL (CATHETERS) ×2 IMPLANT
CATH OPTITORQUE TIG 4.0 5F (CATHETERS) ×2 IMPLANT
DEVICE RAD COMP TR BAND LRG (VASCULAR PRODUCTS) ×2 IMPLANT
GLIDESHEATH SLEND A-KIT 6F 22G (SHEATH) ×2 IMPLANT
KIT HEART LEFT (KITS) ×2 IMPLANT
PACK CARDIAC CATHETERIZATION (CUSTOM PROCEDURE TRAY) ×2 IMPLANT
SYR MEDRAD MARK V 150ML (SYRINGE) ×2 IMPLANT
TRANSDUCER W/STOPCOCK (MISCELLANEOUS) ×2 IMPLANT
TUBING CIL FLEX 10 FLL-RA (TUBING) ×2 IMPLANT
WIRE SAFE-T 1.5MM-J .035X260CM (WIRE) ×2 IMPLANT

## 2015-03-31 NOTE — H&P (View-Only) (Signed)
Marc: [name] MRN: [id number] DOB: 1946-03-08 PCP: [name], MD  Clinic Note: Chief Complaint  [name] with  . other    C/o chest pain with burning sensation and left arm pain. Meds reviewed verbally with pt.    HPI: [name] is a 69 y.o. male with a PMH below who [name] today for urgent evaluation of exertional dyspnea and chest discomfort along with left arm discomfort. He is a Marc of Dr. Kristeen Miss who is now reestablished with [facility] after having moved back to the [place] area from [place] earlier this spring. In 2010 he had a Myoview stress test (converted to Lexiscan from treadmill due to inability to reach target heart rate) --the study was normal with no evidence of ischemia and normal LV function. EF 72%.   Seen by Dr. Elease Hashimoto for initial visit in May of this year, and really was not having much the way of symptoms. He was referred back to Dr. Betti Cruz based on his history of an occluded distal aorta. At that time he noted some mild exertional dyspnea. He saw Dr. Hart Rochester in late June and noted stable claudication in both lower surgeries with the right being greater than left. Apparently the "occluded aorta "came from attempted catheterization procedure via the right groin. He said the past he had a left brachial approach for catheterization done. He apparently was able to ambulate about a quarter mile before having to stop due to his claudication. He had ABIs checked that showed bilateral ABIs of 0.8 indicating mild arterial occlusion.  Interval History: [name] today with basically what he describes as a "haphazard /sporadic " complaints of exertional chest burning/tightness associated with pain in the left arm. This is been going on for a couple years but is now becoming more frequent over the last month or 2. He notes that it is worse when doing any walking up an incline. He says oftentimes the discomfort will last  about a minute or so and he will stop and is relieved with rest. It is associated with exertional shortness of breath.   He is concerned, because over the last couple weeks he is also noticing this can happen with walking on flat ground for instance from the grocery store parking lot. For the most part the episodes don't last more than about a minute, but he has had some but occur for about 20-30 minutes. He says that these episodes are coronary more frequently than he had been for and are associated more exertional dyspnea and prolonged discomfort. He has started to notice it sometimes with minimal activity especially (at night when he gets up to go to the bathroom simply walking to the bathroom constantly quite short of breath and discomfort. He is a hard time quantifying discomfort would say that at its worst is probably 6 out of 10. At baseline prior to this he would June 2010 walk on treadmill at the 3.0-3.2 rate for maybe about half a mile in addition to some other exercises. However if he increases his level of exertion he does note the discomfort. He was told the past he has an irregular heartbeat, but he denies any significant rapid or irregular heartbeat/palpitations.   He has mild stable lower extremity edema.  The remainder of cardiac review of systems is as follows: Cardiovascular ROS: positive for - chest pain, dyspnea on exertion, edema and irregular heartbeat negative for - loss of consciousness, murmur, orthopnea, palpitations, paroxysmal nocturnal dyspnea, rapid  heart rate, shortness of breath or TIA/amaurosis fugax, syncope/near syncope : He does have easy bruising from being on long-term Plavix for PAD, but has not had any problems with known, hematochezia, hematuria, or epistaxis.  Past Medical History  Diagnosis Date  . Coronary artery disease     By report  . Peripheral vascular disease     Bilateral ABIs 0.8  . Chronic distal aortic occlusion     By report  . Diabetes  mellitus     By his report, this is prediabetes   . SOB (shortness of breath)   . Hyperlipidemia LDL goal <70   . Essential hypertension   . Weakness   . Erectile dysfunction   . Hypogonadism in male     Previously on testosterone injections, but has not yet restarted until further testing  . Enlarged prostate   . Arthritis   . Depression   . History of kidney stones   . Muscle pain     Prior Cardiac Evaluation and Past Surgical History: Past Surgical History  Procedure Laterality Date  . Cardiac catheterization  1992  . Ventral hernia repair  1994    complicated by infection  . Cholecystectomy  2000    also had revision of hernia repair  . Cystectomy      face and scrotum  . Blepharoplasty    . Lexiscan myoview  June 2010    converted to [facility] from treadmill due to inability to reach target heart rate) --the study was normal with no evidence of ischemia and normal LV function. EF 7    studies reviewed: Lexiscan Myoview from June 2010, ABIs from June 2016   Allergies  Allergen Reactions  . Fenofibrate   . Lipitor [Atorvastatin Calcium]     Current Outpatient Prescriptions  Medication Sig Dispense Refill  . acetaminophen (TYLENOL) 650 MG CR tablet Take 650 mg by mouth every 8 (eight) hours as needed.    . clobetasol ointment (TEMOVATE) 0.05 % Apply 1 application topically 2 (two) times daily. 30 g 0  . clopidogrel (PLAVIX) 75 MG tablet Take 1 tablet (75 mg total) by mouth daily. 90 tablet 3  . fexofenadine (ALLEGRA) 180 MG tablet Take 180 mg by mouth daily.    . finasteride (PROSCAR) 5 MG tablet Take 1 tablet (5 mg total) by mouth daily. 90 tablet 3  . gabapentin (NEURONTIN) 400 MG capsule Take 1 capsule (400 mg total) by mouth 3 (three) times daily. 270 capsule 3  . KETOCONAZOLE, TOPICAL, 1 % SHAM Apply topically.    . metoprolol succinate (TOPROL XL) 25 MG 24 hr tablet Take 1 tablet (25 mg total) by mouth daily. 90 tablet 3  . Multiple Vitamin (MULTIVITAMIN) tablet  Take 1 tablet by mouth daily.    . naproxen sodium (ANAPROX) 220 MG tablet Take 220 mg by mouth 2 (two) times daily with a meal.    . simvastatin (ZOCOR) 20 MG tablet Take 1 tablet (20 mg total) by mouth daily. 90 tablet 3  . testosterone cypionate (DEPOTESTOSTERONE CYPIONATE) 200 MG/ML injection Inject 1 mL (200 mg total) into the muscle every 14 (fourteen) days. 10 mL 0  . nitroGLYCERIN (NITROSTAT) 0.4 MG SL tablet Place 1 tablet (0.4 mg total) under the tongue every 5 (five) minutes as needed for chest pain. 25 tablet 0   No current facility-administered medications for this visit.    Social History   Social History Narrative   Lives in [place] with wife. No pets  Two children.      Former smoker. Quit in 1994. Prior to quitting was unable to walk without exertional dyspnea.      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      Hobbies - reading, drives Corvette, chuch    family history includes Coronary artery disease in his father; Heart disease in his father; Stroke in his mother and sister. There is no history of Prostate cancer or Kidney disease.  ROS: A comprehensive Review of Systems - was performed Review of Systems  Constitutional: Positive for malaise/fatigue.  Eyes: Negative for blurred vision.  Cardiovascular: Positive for claudication (Stable, nonlimiting).  Gastrointestinal: Negative for blood in stool and melena.  Genitourinary: Negative for hematuria.  Musculoskeletal: Positive for myalgias and joint pain.  Neurological: Negative for dizziness, focal weakness and headaches.  Endo/Heme/Allergies: Bruises/bleeds easily.  Psychiatric/Behavioral: Negative for depression. The Marc is not nervous/anxious.   All other systems reviewed and are negative.  Wt Readings from Last 3 Encounters:  03/23/15 209 lb 4 oz (94.915 kg)  03/22/15 210 lb 8 oz (95.482 kg)  03/04/15 206 lb 9.6 oz (93.713 kg)     PHYSICAL EXAM BP 106/66 mmHg  Pulse 76  Ht 5\' 9"  (1.753 m)  Wt 209 lb 4 oz (94.915 kg)  BMI 30.89 kg/m2 General appearance: alert, cooperative, appears stated age, no distress, mildly obese and (Truncal obesity). Answers questions properly. HEENT: [place]/AT, EOMI, MMM, anicteric sclera - mild left eye ptosis (had a history of blepharoplasty) Neck: no adenopathy, no carotid bruit, no JVD and supple, symmetrical, trachea midline Lungs: clear to auscultation bilaterally, normal percussion bilaterally and Nonlabored, good air movement Heart: RRR with normal S1 and S2. Occasional ectopy. No M/R/G. Abdomen: soft, non-tender; bowel sounds normal; no masses,  no organomegaly and Truncal obesity Extremities: edema 1+ pitting, no ulcers, gangrene or trophic changes, venous stasis dermatitis noted and Distal rubor without calor. Faint pedal pulses - barely palpable Pulses: 1+ bilateral radial with a barely palpable DP/PT, 2+ femoral with + bruit Skin: Distal foot/leg rubor with mild stasis changes. No evidence of cellulitis Neurologic: Mental status: Alert, oriented, thought content appropriate, affect: normal Cranial nerves: Grossly normal    Adult ECG Report  Rate: 76 ;  Rhythm: normal sinus rhythm and Borderline short PR.  QRS Axis: 24 ;  PR Interval: 112 (borderline short) ;  QRS Duration: 96 ; QTc: 372;  Voltages: Borderline low in precordial leads   Narrative Interpretation: otherwise normal EKG  Recent Labs:    Chemistry      Component Value Date/Time   NA 139 03/23/2015 1641   K 3.8 03/23/2015 1641   CL 107 03/23/2015 1641   CO2 27 03/23/2015 1641   BUN 22* 03/23/2015 1641   CREATININE 1.13 03/23/2015 1641      Component Value Date/Time   CALCIUM 8.8* 03/23/2015 1641   ALKPHOS 49 12/17/2014 1442   AST 16 12/17/2014 1442   ALT 20 12/17/2014 1442   BILITOT 0.4 12/17/2014 1442     No results found for: CHOL, HDL, LDLCALC, LDLDIRECT, TRIG, CHOLHDL   ASSESSMENT /  PLAN: 69 year old gentleman with significant cardiac risk factors including PAD (aortic occlusion), prediabetes, hypertension (treated), hyperlipidemia and former smoker who now [name] with symptoms of progressive exertional chest tightness/burning with dyspnea. Symptoms are concerning for progressive angina from class II to class III. Given his pretest probability for CAD, I concerned that a noninvasive study  would not satisfy concerns for this being angina. I think the best course of action is to proceed with invasive evaluation with cardiac catheterization.  Problem List Items Addressed This Visit    Aortic atherosclerosis (Chronic)    Plan Abdominal Aortic Angiography at the time of cardiac catheterization to confirm or deny aortic occlusion versus severe stenosis      Relevant Medications   nitroGLYCERIN (NITROSTAT) 0.4 MG SL tablet   CAD (coronary artery disease) (Chronic)    I don't know where this diagnosis comes from, he clearly CAD risk equivalents with PAD, hyperlipidemia, prediabetes and hypertension.  On statin, aspirin plus Plavix and beta blocker. With concerning symptoms for angina will provide when necessary nitroglycerin      Relevant Medications   nitroGLYCERIN (NITROSTAT) 0.4 MG SL tablet   Chest pain - Primary   Relevant Medications   nitroGLYCERIN (NITROSTAT) 0.4 MG SL tablet   Essential hypertension (Chronic)    Stable blood pressure readings. He is only on low-dose beta blocker      Relevant Medications   nitroGLYCERIN (NITROSTAT) 0.4 MG SL tablet   Hypogonadism male    Has an endocrinology evaluation pending. Would be reluctant to aggressively treat low testosterone for fear of potential cardiovascular complication.      PAD (peripheral artery disease) (Chronic)    Followed by Dr. Hart Rochester. ABIs recently recorded. He is on statin and Plavix already.  He is not having claudication As there is concern for possible aortic occlusion, I may consider Abdominal  Aortic Angiography at the time of cardiac catheterization. I discussed this with him as well.      Relevant Medications   nitroGLYCERIN (NITROSTAT) 0.4 MG SL tablet   Progressive angina: Class III     He does have some relatively classic features for exertional angina with a burning tightness in his chest happening mostly with exertion now with less exertion than before. It is associated with dyspnea and there is potential radiation to the left arm. He has significant risk factors as mentioned.  Plan: Left Heart Catheterization with Coronary Angiography Plus Minus PCI.  AUC: CAD Assessment (Coronary Angiography With or Without Left Heart Catheterization and/or Left Ventriculography)  Marc Information:    Suspected CAD (No Prior PCI, No Prior CABG, and No Prior Angiogram Showing >= 50% Angiographic Stenosis)   No Prior Noninvasive Testing   Symptomatic   High Pretest Probability  AUC Score:   A (7)   Indication:   10   The procedure with Risks/Benefits/Alternatives and Indications was reviewed with the Marc.  All questions were answered.    Risks / Complications include, but not limited to: Death, MI, CVA/TIA, VF/VT (with defibrillation), Bradycardia (need for temporary pacer placement), contrast induced nephropathy, bleeding / bruising / hematoma / pseudoaneurysm, vascular or coronary injury (with possible emergent CT or Vascular Surgery), adverse medication reactions, infection.  Additional risks involving the use of radiation with the possibility of radiation burns and cancer were explained in detail.  The Marc voices understanding and agree to proceed.          Relevant Medications   nitroGLYCERIN (NITROSTAT) 0.4 MG SL tablet   Other Relevant Orders   EKG 12-Lead (Completed)    Other Visit Diagnoses    Pre-procedure lab exam        Relevant Orders    CBC    Basic Metabolic Panel (BMET)    INR/PT    DG Chest 2 View (Completed)       Meds  ordered this  encounter  Medications  . nitroGLYCERIN (NITROSTAT) 0.4 MG SL tablet    Sig: Place 1 tablet (0.4 mg total) under the tongue every 5 (five) minutes as needed for chest pain.    Dispense:  25 tablet    Refill:  0      Followup: 1-2 weeks with Dr. Elease Hashimoto  [facility] [name], M.D., M.S. Interventional Cardiolgy [facility]    AUC for PCI: Ischemic Symptoms? CCS III (Marked limitation of ordinary activity) Anti-ischemic Medical Therapy? Minimal Therapy (1 class of medications) Non-invasive Test Results? No non-invasive testing performed Prior CABG? No Previous CABG   Marc Information:   1-2V CAD, no prox LAD  A (7)  Indication: 20; Score: 7   Marc Information:   1-2V-CAD with DS 50-60% With No FFR, No IVUS  I (3)  Indication: 21; Score: 3   Marc Information:   1-2V-CAD with DS 50-60% With FFR  A (7)  Indication: 22; Score: 7   Marc Information:   1-2V-CAD with DS 50-60% With FFR>0.8, IVUS not significant  I (2)  Indication: 23; Score: 2   Marc Information:   3V-CAD without LMCA With Abnormal LV systolic function  A (9)  Indication: 48; Score: 9   Marc Information:   LMCA-CAD  A (9)  Indication: 49; Score: 9   Marc Information:   2V-CAD with prox LAD PCI  A (7)  Indication: 62; Score: 7   Marc Information:   2V-CAD with prox LAD CABG  A (8)  Indication: 62; Score: 8   Marc Information:   3V-CAD without LMCA With Low CAD burden(i.e., 3 focal stenoses, low SYNTAX score) PCI  A (7)  Indication: 63; Score: 7   Marc Information:   3V-CAD without LMCA With Low CAD burden(i.e., 3 focal stenoses, low SYNTAX score) CABG  A (9)  Indication: 63; Score: 9   Marc Information:   3V-CAD without LMCA E06c - Intermediate-high CAD burden (i.e., multiple diffuse lesions, presence of CTO, or high SYNTAX score) PCI  U (4)  Indication: 64; Score: 4   Marc Information:   3V-CAD without  LMCA E06c - Intermediate-high CAD burden (i.e., multiple diffuse lesions, presence of CTO, or high SYNTAX score) CABG  A (9)  Indication: 64; Score: 9   Marc Information:   LMCA-CAD With Isolated LMCA stenosis  PCI  U (6)  Indication: 65; Score: 6   Marc Information:   LMCA-CAD With Isolated LMCA stenosis  CABG  A (9)  Indication: 65; Score: 9   Marc Information:   LMCA-CAD Additional CAD, low CAD burden (i.e., 1- to 2-vessel additional involvement, low SYNTAX score) PCI  U (5)  Indication: 66; Score: 5   Marc Information:   LMCA-CAD Additional CAD, low CAD burden (i.e., 1- to 2-vessel additional involvement, low SYNTAX score) CABG  A (9)  Indication: 66; Score: 9   Marc Information:   LMCA-CAD Additional CAD, intermediate-high CAD burden (i.e., 3-vessel involvement, presence of CTO, or high SYNTAX score) PCI  I (3)  Indication: 67; Score: 3   Marc Information:   LMCA-CAD Additional CAD, intermediate-high CAD burden (i.e., 3-vessel involvement, presence of CTO, or high SYNTAX score) CABG  A (9)  Indication: 67; Score: 9

## 2015-03-31 NOTE — Progress Notes (Signed)
Utilization Review Completed.Yasmen Cortner T9/15/2016  

## 2015-03-31 NOTE — Progress Notes (Signed)
  Echocardiogram 2D Echocardiogram has been performed.  [name] M 03/31/2015, 4:07 PM

## 2015-03-31 NOTE — Progress Notes (Signed)
ANTICOAGULATION CONSULT NOTE - Initial Consult  Pharmacy Consult for Heparin  Indication: CAD / Afib  Allergies  Allergen Reactions  . Fenofibrate   . Lipitor [Atorvastatin Calcium]     Patient Measurements: Height:  (175.3 cm) Weight: 203 lb (92.08 kg) IBW/kg (Calculated) : 70.7 Heparin Dosing Weight: 92kg   Vital Signs: Temp: 97.7 F (36.5 C) (09/15 0550) Temp Source: Oral (09/15 0550) BP: 199/162 mmHg (09/15 1000) Pulse Rate: 88 (09/15 1000)  Labs: No results for input(s): HGB, HCT, PLT, APTT, LABPROT, INR, HEPARINUNFRC, CREATININE, CKTOTAL, CKMB, TROPONINI in the last 72 hours.  Estimated Creatinine Clearance: 68.2 mL/min (by C-G formula based on Cr of 1.13).   Medical History: Past Medical History  Diagnosis Date  . Coronary artery disease     By report  . Peripheral vascular disease     Bilateral ABIs 0.8  . Chronic distal aortic occlusion     By report  . Diabetes mellitus     By his report, this is prediabetes   . SOB (shortness of breath)   . Hyperlipidemia LDL goal <70   . Essential hypertension   . Weakness   . Erectile dysfunction   . Hypogonadism in male     Previously on testosterone injections, but has not yet restarted until further testing  . Enlarged prostate   . Arthritis   . Depression   . History of kidney stones   . Muscle pain      Assessment: 68yom admitted for cath found to have severe CAD and TCTS consult pending.  Also with new Afib - started on amiodarone drip.  Plan to start heparin drip 6hr after TR band off.   Follow up CBC, renal function and meds.  Goal of Therapy:  Heparin level 0.3-0.7 units/ml Monitor platelets by anticoagulation protocol: Yes   Plan:  No bolus  Heparin drip 1200 uts/hr 6hr after TR band off Draw HL 6 hr after heparin drip started Daily HL, CBC  Leota Sauers Pharm.D. CPP, BCPS Clinical Pharmacist [phone] 03/31/2015 10:48 AM

## 2015-03-31 NOTE — Interval H&P Note (Signed)
History and Physical Interval Note:  03/31/2015 7:21 AM  [name]  has presented today for surgery, with the diagnosis of cp - concern for Progressive Angina - Class III.   AUC in original note.  The various methods of treatment have been discussed with the patient and family. After consideration of risks, benefits and other options for treatment, the patient has consented to  Procedure(s): Left Heart Cath and Coronary Angiography (N/A) with possible Percutaneous Coronary Intervention as a surgical intervention .  The patient's history has been reviewed, patient examined, no change in status, stable for surgery.  I have reviewed the patient's chart and labs.  Questions were answered to the patient's satisfaction.     Cath Lab Visit (complete for each Cath Lab visit)  Clinical Evaluation Leading to the Procedure:   ACS: No.  Non-ACS:    Anginal Classification: CCS III  Anti-ischemic medical therapy: Minimal Therapy (1 class of medications)  Non-Invasive Test Results: No non-invasive testing performed  Prior CABG: No previous CABG        HARDING, DAVID W

## 2015-03-31 NOTE — Discharge Summary (Signed)
[address]       [place] [address]             [id number]        Daemien Fronczak [facility] Medical Record #[id number] Date of Birth: Dec 07, 1945  Referring: [name] Primary Care: [name], MD  Chief Complaint:   CAD  History of Present Illness:      Mr. Palma is a 69 yo white male with no known previous CAD.  He was previously followed by Dr. Elease Hashimoto however he relocated to [facility].  He recently moved back to the [place] area this spring.  The patient followed up with Dr. Melburn Popper in May of this year at which time he was not experiencing symptoms.  He was told at some point that he had an occlusion in his distal aorta.  He was evaluated by Dr. Hart Rochester in June at which time he was experiencing claudication in both legs the R>L.  He can ambulate a quarter of a mile prior to developing claudication symptoms.  ABI's were obtained and showed mild arterial occlusion.  However, since May the patient has developed episodes of chest pain/tightness with radiation to the left arm.  However, this has been getting progressively worse.  He developed chest symptoms and shortness of breath with minimal exertion.  He states these episodes would last 20-30 minutes at times.  He states they have occurred just by him ambulating to the bathroom.  He presented to his PCP with these complaints and he felt he should have emergent cardiology evaluation.  He was evaluated by Dr. [name] on 03/23/2015 at which time he felt the patient should have cardiac catheterization.  This was done on 03/31/2015 which revealed evidence of CAD with LM involvement.  During the procedure the patient had Atrial Fibrillation when the wire crossed the valve.  It was felt coronary bypass grafting would be indicated and TCTS consult was obtained.  Currently the patient is chest pain free.  He states his symptoms have been occurring for sometime, however they have been getting worse.  He used to smoke 2-3  packs of unfiltered cigarettes per day, however he quit in 1994.     Current Activity/ Functional Status: Patient is independent with mobility/ambulation, transfers, ADL's, IADL's.   Zubrod Score: At the time of surgery this patient's most appropriate activity status/level should be described as: []     0    Normal activity, no symptoms [x]     1    Restricted in physical strenuous activity but ambulatory, able to do out light work []     2    Ambulatory and capable of self care, unable to do work activities, up and about                 more than 50%  Of the time                            []     3    Only limited self care, in bed greater than 50% of waking hours []     4    Completely disabled, no self care, confined to bed or chair []     5    Moribund  Past Medical History  Diagnosis Date  . Coronary artery disease     By report  . Peripheral vascular disease     Bilateral ABIs 0.8  . Chronic distal aortic occlusion  By report  . Diabetes mellitus     By his report, this is prediabetes   . SOB (shortness of breath)   . Hyperlipidemia LDL goal <70   . Essential hypertension   . Weakness   . Erectile dysfunction   . Hypogonadism in male     Previously on testosterone injections, but has not yet restarted until further testing  . Enlarged prostate   . Arthritis   . Depression   . History of kidney stones   . Muscle pain     Past Surgical History  Procedure Laterality Date  . Cardiac catheterization  1992  . Ventral hernia repair  1994    complicated by infection  . Cholecystectomy  2000    also had revision of hernia repair  . Cystectomy      face and scrotum  . Blepharoplasty    . Lexiscan myoview  June 2010    converted to [facility] from treadmill due to inability to reach target heart rate) --the study was normal with no evidence of ischemia and normal LV function. EF 7    History  Smoking status  . Former Smoker -- 2.00 packs/day for 40 years  . Types:  Cigarettes  . Quit date: 10/17/1992  Smokeless tobacco  . Not on file    Comment: quit 1994    History  Alcohol Use  . 0.0 oz/week  . 0 Standard drinks or equivalent per week    Comment: seldom    Social History   Social History  . Marital Status: Married    Spouse Name: N/A  . Number of Children: N/A  . Years of Education: N/A   Occupational History  . Not on file.   Social History Main Topics  . Smoking status: Former Smoker -- 2.00 packs/day for 40 years    Types: Cigarettes    Quit date: 10/17/1992  . Smokeless tobacco: Not on file     Comment: quit 1994  . Alcohol Use: 0.0 oz/week    0 Standard drinks or equivalent per week     Comment: seldom  . Drug Use: No  . Sexual Activity: Not on file   Other Topics Concern  . Not on file   Social History Narrative   Lives in [place] with wife. No pets   Two children.      Former smoker. Quit in 1994. Prior to quitting was unable to walk without exertional dyspnea.      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      Hobbies - reading, drives Corvette, chuch    Allergies  Allergen Reactions  . Fenofibrate   . Lipitor [Atorvastatin Calcium]     Current Facility-Administered Medications  Medication Dose Route Frequency Provider Last Rate Last Dose  . 0.9 %  sodium chloride infusion  250 mL Intravenous PRN Marykay Lex, MD      . 0.9% sodium chloride infusion  3 mL/kg/hr Intravenous Continuous Marykay Lex, MD 276.3 mL/hr at 03/31/15 0931 3 mL/kg/hr at 03/31/15 0931  . acetaminophen (TYLENOL) tablet 650 mg  650 mg Oral Q4H PRN Marykay Lex, MD      . amiodarone (NEXTERONE PREMIX) 360 MG/200ML (1.8 mg/mL) IV infusion  60 mg/hr Intravenous Continuous Marykay Lex, MD 33.3 mL/hr at 03/31/15 1037 60 mg/hr at 03/31/15 1037   Followed by  . amiodarone (NEXTERONE PREMIX) 360 MG/200ML (1.8 mg/mL) IV infusion  30 mg/hr Intravenous  Continuous Marykay Lex, MD      . aspirin 81 MG chewable tablet           . heparin ADULT infusion 100 units/mL (25000 units/250 mL)  1,200 Units/hr Intravenous Continuous Marykay Lex, MD      . morphine 2 MG/ML injection 2 mg  2 mg Intravenous Q1H PRN Marykay Lex, MD      . ondansetron [facility]) injection 4 mg  4 mg Intravenous Q6H PRN Marykay Lex, MD      . sodium chloride 0.9 % injection 3 mL  3 mL Intravenous Q12H Marykay Lex, MD   3 mL at 03/31/15 1300  . sodium chloride 0.9 % injection 3 mL  3 mL Intravenous PRN Marykay Lex, MD        Prescriptions prior to admission  Medication Sig Dispense Refill Last Dose  . acetaminophen (TYLENOL) 650 MG CR tablet Take 650 mg by mouth every 8 (eight) hours as needed.   Taking  . clobetasol ointment (TEMOVATE) 0.05 % Apply 1 application topically 2 (two) times daily. 30 g 0 Taking  . clopidogrel (PLAVIX) 75 MG tablet Take 1 tablet (75 mg total) by mouth daily. 90 tablet 3 03/31/2015 at 0510  . fexofenadine (ALLEGRA) 180 MG tablet Take 180 mg by mouth daily.   03/29/2015 at Unknown time  . finasteride (PROSCAR) 5 MG tablet Take 1 tablet (5 mg total) by mouth daily. 90 tablet 3 03/31/2015 at Unknown time  . gabapentin (NEURONTIN) 400 MG capsule Take 1 capsule (400 mg total) by mouth 3 (three) times daily. 270 capsule 3 03/31/2015 at Unknown time  . KETOCONAZOLE, TOPICAL, 1 % SHAM Apply topically.   Taking  . metoprolol succinate (TOPROL XL) 25 MG 24 hr tablet Take 1 tablet (25 mg total) by mouth daily. 90 tablet 3 03/31/2015 at Unknown time  . Multiple Vitamin (MULTIVITAMIN) tablet Take 1 tablet by mouth daily.   03/31/2015 at Unknown time  . nitroGLYCERIN (NITROSTAT) 0.4 MG SL tablet Place 1 tablet (0.4 mg total) under the tongue every 5 (five) minutes as needed for chest pain. 25 tablet 0   . simvastatin (ZOCOR) 20 MG tablet Take 1 tablet (20 mg total) by mouth daily. 90 tablet 3 03/30/2015 at Unknown time  . naproxen sodium (ANAPROX)  220 MG tablet Take 220 mg by mouth 2 (two) times daily with a meal.   Taking  . testosterone cypionate (DEPOTESTOSTERONE CYPIONATE) 200 MG/ML injection Inject 1 mL (200 mg total) into the muscle every 14 (fourteen) days. (Patient not taking: Reported on 03/29/2015) 10 mL 0 Taking    Family History  Problem Relation Age of Onset  . Coronary artery disease Father   . Heart disease Father   . Stroke Mother   . Stroke Sister   . Prostate cancer Neg Hx   . Kidney disease Neg Hx    Review of Systems:  Constitutional: negative Eyes: negative Respiratory: positive for dyspnea on exertion Cardiovascular: positive for chest pain, claudication, dyspnea, exertional chest pressure/discomfort and palpitations Gastrointestinal: negative Integument/breast: positive for LE spider veins Neurological: negative     Cardiac Review of Systems: Y or N  Chest Pain [    ]  Resting SOB [   ] Exertional SOB  [  ]  Orthopnea [  ]   Pedal Edema [   ]    Palpitations [  ] Syncope  [  ]   Presyncope [   ]  General Review of Systems: [Y] = yes [  ]=no Constitional: recent weight change [  ]; anorexia [  ]; fatigue [  ]; nausea [  ]; night sweats [  ]; fever [  ]; or chills [  ]                                                               Dental: poor dentition[  ]; Last Dentist visit:   Eye : blurred vision [  ]; diplopia [   ]; vision changes [  ];  Amaurosis fugax[  ]; Resp: cough [  ];  wheezing[  ];  hemoptysis[  ]; shortness of breath[  ]; paroxysmal nocturnal dyspnea[  ]; dyspnea on exertion[  ]; or orthopnea[  ];  GI:  gallstones[  ], vomiting[  ];  dysphagia[  ]; melena[  ];  hematochezia [  ]; heartburn[  ];   Hx of  Colonoscopy[  ]; GU: kidney stones [  ]; hematuria[  ];   dysuria [  ];  nocturia[  ];  history of     obstruction [  ]; urinary frequency [  ]             Skin: rash, swelling[  ];, hair loss[  ];  peripheral edema[  ];  or itching[  ]; Musculosketetal: myalgias[  ];  joint swelling[  ];   joint erythema[  ];  joint pain[  ];  back pain[  ];  Heme/Lymph: bruising[  ];  bleeding[  ];  anemia[  ];  Neuro: TIA[  ];  headaches[  ];  stroke[  ];  vertigo[  ];  seizures[  ];   paresthesias[  ];  difficulty walking[y  ];  Psych:depression[  ]; anxiety[  ];  Endocrine: diabetes[  ];  thyroid dysfunction[  ];  Immunizations: Flu [ n ]; Pneumococcal[ n ];  Other:  Physical Exam: BP 199/162 mmHg  Pulse 88  Temp(Src) 97.7 F (36.5 C) (Oral)  Resp 17  Ht 5\' 9"  (1.753 m)  Wt 203 lb (92.08 kg)  BMI 29.96 kg/m2  SpO2 97%  General appearance: alert, cooperative and no distress Head: Normocephalic, without obvious abnormality, atraumatic Neck: no adenopathy, no carotid bruit, no JVD, supple, symmetrical, trachea midline and thyroid not enlarged, symmetric, no tenderness/mass/nodules Resp: clear to auscultation bilaterally Cardio: irregularly irregular rhythm GI: soft, non-tender; bowel sounds normal; no masses,  no organomegaly Extremities: trace edema, spider veins bilaterally Neurologic: Grossly normal  Diagnostic Studies & Laboratory data:     Recent Radiology Findings:   No results found.   I have independently reviewed the above radiologic studies.  Recent Lab Findings: Lab Results  Component Value Date   WBC 9.4 03/23/2015   HGB 14.3 03/23/2015   HCT 41.4 03/23/2015   PLT 169 03/23/2015   GLUCOSE 117* 03/23/2015   ALT 20 12/17/2014   AST 16 12/17/2014   NA 139 03/23/2015   K 3.8 03/23/2015   CL 107 03/23/2015   CREATININE 1.13 03/23/2015   BUN 22* 03/23/2015   CO2 27 03/23/2015   TSH 2.390 03/02/2015   INR 0.94 03/23/2015   HGBA1C * 12/18/2009    6.2 (NOTE)  According to the ADA Clinical Practice Recommendations for 2011, when HbA1c is used as a screening test:   >=6.5%   Diagnostic of Diabetes Mellitus           (if abnormal result  is confirmed)  5.7-6.4%   Increased risk of developing  Diabetes Mellitus  References:Diagnosis and Classification of Diabetes Mellitus,Diabetes Care,2011,34(Suppl 1):S62-S69 and Standards of Medical Care in         Diabetes - 2011,Diabetes Care,2011,34  (Suppl 1):S11-S61.   ECHO: Study Conclusions  - Left ventricle: The cavity size was normal. Wall thickness was increased in a pattern of mild LVH. Systolic function was normal. The estimated ejection fraction was in the range of 55% to 60%. Images were inadequate for LV wall motion assessment. The study is not technically sufficient to allow evaluation of LV diastolic function. - Mitral valve: Calcified annulus. There was trivial regurgitation. - Left atrium: The atrium was normal in size. - Right atrium: The atrium was normal in size. - Tricuspid valve: There was trivial regurgitation. - Pulmonary arteries: PA peak pressure: 30 mm Hg (S).  Impressions:  - LVEF 55-60%, mild LVH, normal biatrial size, trivial TR, RVSP 30 mmHg.  CATH: Conclusion    1. Severe multivessel disease with heavy calcification of the proximal left and right coronary arteries. 2. LM lesion, 65% stenosed. 3. Mid LAD-1 lesion, 70% stenosed. Mid LAD-2 lesion, 90% stenosed prior to D1. Distmid LAD lesion, 80% stenosed. 4. Ost Cx to Prox Cx lesion, 75% stenosed. Mid Cx lesion, 95% stenosed. Dist Cx lesion, 70% stenosed. 1st Mrg lesion, 60% stenosed wtih 80% in smaller OM1a branch 5. Mid RCA lesion, 90% stenosed. Mid RCA to Dist RCA lesion, 100% stenosed - CTO. There is R-R collateralization from the RV Marginal branch to RPDA faintly filling the RPDA & small RPL. 6. The left ventricular systolic function is normal. 7. New onset Diagnosis of Afib - initially with RVR   The patient has severe multivessel disease including left main disease with occluded RCA and now new diagnosis of atrial fibrillation. Unfortunately he has been on chronic Plavix for his aortic occlusion. Based on the presence of severe  disease in symptoms with RVR, I feel it is prudent to admit the patient to stepdown unit for treatment of his age and fibrillation and to allow time for CT surgical consultation to consider timing of his bypass surgery.    Assessment / Plan:      1. CAD- needs CABG, has on Plavix- will need wash out, likely OR Tuesday 2  peripherial vascular disease 3. New onset Atrial Fibrillation- on Amiodarone per Cardiology 4. H/O nicotine abuse- quit 1994   Check p2y12  Plan cabg Tuesday [name] MD      [address] [place].[address] [place] [address] Office [phone]   Beeper [phone]

## 2015-04-01 ENCOUNTER — Inpatient Hospital Stay (HOSPITAL_COMMUNITY): Payer: Medicare Other

## 2015-04-01 ENCOUNTER — Encounter (HOSPITAL_COMMUNITY): Payer: Self-pay | Admitting: *Deleted

## 2015-04-01 DIAGNOSIS — I739 Peripheral vascular disease, unspecified: Secondary | ICD-10-CM

## 2015-04-01 DIAGNOSIS — I200 Unstable angina: Secondary | ICD-10-CM

## 2015-04-01 DIAGNOSIS — I10 Essential (primary) hypertension: Secondary | ICD-10-CM

## 2015-04-01 DIAGNOSIS — I700 Atherosclerosis of aorta: Secondary | ICD-10-CM

## 2015-04-01 DIAGNOSIS — I2510 Atherosclerotic heart disease of native coronary artery without angina pectoris: Secondary | ICD-10-CM

## 2015-04-01 LAB — LIPID PANEL
Cholesterol: 215 mg/dL — ABNORMAL HIGH (ref 0–200)
HDL: 41 mg/dL (ref >40)
LDL Cholesterol: 132 mg/dL — ABNORMAL HIGH (ref 0–99)
Total CHOL/HDL Ratio: 5.2 RATIO
Triglycerides: 211 mg/dL — ABNORMAL HIGH (ref <150)
VLDL: 42 mg/dL — ABNORMAL HIGH (ref 0–40)

## 2015-04-01 LAB — BASIC METABOLIC PANEL
Anion gap: 8 (ref 5–15)
BUN: 18 mg/dL (ref 6–20)
CO2: 27 mmol/L (ref 22–32)
Calcium: 9.1 mg/dL (ref 8.9–10.3)
Chloride: 104 mmol/L (ref 101–111)
Creatinine, Ser: 1.11 mg/dL (ref 0.61–1.24)
GFR calc Af Amer: >60 mL/min (ref >60)
GFR calc non Af Amer: >60 mL/min (ref >60)
Glucose, Bld: 144 mg/dL — ABNORMAL HIGH (ref 65–99)
Potassium: 4.5 mmol/L (ref 3.5–5.1)
Sodium: 139 mmol/L (ref 135–145)

## 2015-04-01 LAB — CBC
HCT: 46 % (ref 39.0–52.0)
Hemoglobin: 16.1 g/dL (ref 13.0–17.0)
MCH: 33.7 pg (ref 26.0–34.0)
MCHC: 35 g/dL (ref 30.0–36.0)
MCV: 96.2 fL (ref 78.0–100.0)
Platelets: 172 10*3/uL (ref 150–400)
RBC: 4.78 MIL/uL (ref 4.22–5.81)
RDW: 12.5 % (ref 11.5–15.5)
WBC: 9.4 10*3/uL (ref 4.0–10.5)

## 2015-04-01 LAB — PLATELET INHIBITION P2Y12: Platelet Function  P2Y12: 146 [PRU] — ABNORMAL LOW (ref 194–418)

## 2015-04-01 LAB — HEPARIN LEVEL (UNFRACTIONATED)
Heparin Unfractionated: 0.24 IU/mL — ABNORMAL LOW (ref 0.30–0.70)
Heparin Unfractionated: 0.5 IU/mL (ref 0.30–0.70)

## 2015-04-01 LAB — SPIROMETRY WITH GRAPH
FEF 25-75 Pre: 0.88 L/sec
FEF2575-%Pred-Pre: 37 %
FEV1-%Pred-Pre: 61 %
FEV1-Pre: 1.92 L
FEV1FVC-%Pred-Pre: 88 %
FEV6-%Pred-Pre: 69 %
FEV6-Pre: 2.76 L
FEV6FVC-%Pred-Pre: 98 %
FVC-%Pred-Pre: 70 %
FVC-Pre: 2.97 L
Pre FEV1/FVC ratio: 65 %
Pre FEV6/FVC Ratio: 93 %

## 2015-04-01 MED ORDER — METOPROLOL TARTRATE 25 MG PO TABS
25.0000 mg | ORAL_TABLET | Freq: Two times a day (BID) | ORAL | Status: DC
  Administered 2015-04-01 – 2015-04-03 (×6): 25 mg via ORAL
  Filled 2015-04-01 (×6): qty 1

## 2015-04-01 MED ORDER — ASPIRIN EC 81 MG PO TBEC
81.0000 mg | DELAYED_RELEASE_TABLET | Freq: Every day | ORAL | Status: DC
  Administered 2015-04-01 – 2015-04-03 (×3): 81 mg via ORAL
  Filled 2015-04-01 (×3): qty 1

## 2015-04-01 MED ORDER — ATORVASTATIN CALCIUM 80 MG PO TABS
80.0000 mg | ORAL_TABLET | Freq: Every day | ORAL | Status: DC
  Administered 2015-04-01 – 2015-04-10 (×9): 80 mg via ORAL
  Filled 2015-04-01 (×12): qty 1

## 2015-04-01 MED ORDER — NITROGLYCERIN 2 % TD OINT
0.5000 [in_us] | TOPICAL_OINTMENT | Freq: Four times a day (QID) | TRANSDERMAL | Status: DC
  Administered 2015-04-01 – 2015-04-02 (×4): 0.5 [in_us] via TOPICAL
  Filled 2015-04-01: qty 30

## 2015-04-01 MED ORDER — METOPROLOL TARTRATE 12.5 MG HALF TABLET: 12.5000 mg | ORAL_TABLET | Freq: Two times a day (BID) | ORAL | Status: DC

## 2015-04-01 NOTE — Progress Notes (Signed)
ANTICOAGULATION CONSULT NOTE  Pharmacy Consult for Heparin  Indication: CAD / Afib  Allergies  Allergen Reactions  . Fenofibrate   . Lipitor [Atorvastatin Calcium]     Patient Measurements: Height:  (175.3 cm) Weight: 203 lb (92.08 kg) IBW/kg (Calculated) : 70.7 Heparin Dosing Weight: 92kg   Vital Signs: Temp: 98 F (36.7 C) (09/15 2324) Temp Source: Oral (09/15 2324) BP: 139/79 mmHg (09/15 2324) Pulse Rate: 80 (09/15 2013)  Labs:  Recent Labs  03/31/15 2300  HEPARINUNFRC 0.24*    Estimated Creatinine Clearance: 68.2 mL/min (by C-G formula based on Cr of 1.13).   Medical History: Past Medical History  Diagnosis Date  . Coronary artery disease     By report  . Peripheral vascular disease     Bilateral ABIs 0.8  . Chronic distal aortic occlusion     By report  . Diabetes mellitus     By his report, this is prediabetes   . SOB (shortness of breath)   . Hyperlipidemia LDL goal <70   . Essential hypertension   . Weakness   . Erectile dysfunction   . Hypogonadism in male     Previously on testosterone injections, but has not yet restarted until further testing  . Enlarged prostate   . Arthritis   . Depression   . History of kidney stones   . Muscle pain      Assessment: 68yom admitted for cath found to have severe CAD and TCTS consult pending.  Also with new Afib - started on amiodarone drip.  Plan to start heparin drip 6hr after TR band off.   Follow up CBC, renal function and meds.  Initial HL is subtherapeutic at 0.24 on heparin 1200 units/hr. Nurse reports no issues with infusion or bleeding.  Goal of Therapy:  Heparin level 0.3-0.7 units/ml Monitor platelets by anticoagulation protocol: Yes   Plan:  Increase heparin drip to 1400 8h HL Daily HL/CBC  Arlean Hopping. Newman Pies, PharmD Clinical Pharmacist Pager [phone]  04/01/2015 12:56 AM

## 2015-04-01 NOTE — Progress Notes (Signed)
ANTICOAGULATION CONSULT NOTE - Follow Up Consult  Pharmacy Consult for heparin Indication: atrial fibrillation  No Known Allergies  Patient Measurements: Height: $Remov 175.3 cm) Weight: 203 lb (92.08 kg) IBW/kg (Calculated) : 70.7 Heparin Dosing Weight: 92 kg  Vital Signs: Temp: 97.6 F (36.4 C) (09/16 1201) Temp Source: Oral (09/16 1201) BP: 100/58 mmHg (09/16 1201)  Labs:  Recent Labs  03/31/15 2300 04/01/15 0336 04/01/15 1020  HGB  --  16.1  --   HCT  --  46.0  --   PLT  --  172  --   HEPARINUNFRC 0.24*  --  0.50  CREATININE  --  1.11  --     Estimated Creatinine Clearance: 69.5 mL/min (by C-G formula based on Cr of 1.11).   Assessment: 68yoM admitted 03/31/2015 for cath found to have severe CAD with LM involvement and new Afib. With planned CABG 9/20. Also found to have new Afib - started on amiodarone drip. Pharmacy to dose heparin.  HL 0.50 (therapeutic), H/H wnl, Plt wnl, no s/sx of bleeding noted.   Goal of Therapy:  Heparin level 0.3-0.7 units/ml Monitor platelets by anticoagulation protocol: Yes   Plan:  - Continue heparin 1400 units/hr - Monitor daily HL, CBC, s/sx of bleeding  Casilda Carls, PharmD. Clinical Pharmacist Resident Pager: [phone]  04/01/2015,2:44 PM

## 2015-04-01 NOTE — Progress Notes (Signed)
04/01/2015- Respiratory care note- Pre-op teaching done post PFT.  Explained importance of incentive spirometer.  Pt very receptive of pre-op education.  Incentive spirometer left at bedside for pt use.

## 2015-04-01 NOTE — Progress Notes (Signed)
CARDIAC REHAB PHASE I   PRE:  Rate/Rhythm: 71 SR  BP:  Sitting: 125/88        SaO2: 98 Ra  MODE:  Ambulation: 700 ft   POST:  Rate/Rhythm: 73 SR  BP:  Sitting: 128/67         SaO2: 98 RA  Pt in bed, eager to ambulate. Pt ambulated 700 ft on RA, IV, independent, steady gait, tolerated well. Pt denies CP, dizziness, DOE, declined rest stop. Pt HR SR 70s during ambulation. Pt to recliner after walk, call bell within reach. Reviewed IS, gave pt cardiac surgery book, pt verbalized understanding. Will complete pre-op ed at a future time when pt wife available. Will follow.  [id number]  Joylene Grapes, RN, BSN 04/01/2015 3:26 PM

## 2015-04-01 NOTE — Progress Notes (Signed)
PATIENT ID: [id number] with hypertension and PAD (claudication, aortic atherosclerosis) here with newly diagnosed LM disease and new-onset atrial fibrillation awaiting CABG.  INTERVAL HISTORY: [name] underwent cardiac catheterization yesterday and was found to have 3 vessel CAD including LM.  He developed atrial fibrillation upon crossing the aortic valve.  He was started on heparin and amiodarone.  CT surgery was consulted.  SUBJECTIVE:  Feeling well this AM.  Had some short episodes of chest tightness and L arm discomfort.  Not long enough to get nitroglycerin.   PHYSICAL EXAM Filed Vitals:   03/31/15 2013 03/31/15 2324 04/01/15 0350 04/01/15 0743  BP: 143/77 139/79 156/69 134/66  Pulse: 80     Temp: 98 F (36.7 C) 98 F (36.7 C) 97.8 F (36.6 C) 97.8 F (36.6 C)  TempSrc: Oral Oral Oral Oral  Resp:    18  Height:      Weight:      SpO2: 96% 95% 99% 98%   General:  Well-appearing.  NAD. Neck: No JVD Lungs:  CTAB Heart:  Irregularly irregular.  No m/r/g Abdomen:  Soft, NT, ND Extremities:  WWP.  Trace edema  LABS: Lab Results  Component Value Date   TROPONINI <0.01        NO INDICATION OF MYOCARDIAL INJURY. 12/18/2009   Results for orders placed or performed during the hospital encounter of 03/31/15 (from the past 24 hour(s))  Glucose, capillary     Status: Abnormal   Collection Time: 03/31/15  9:07 AM  Result Value Ref Range   Glucose-Capillary 117 (H) 65 - 99 mg/dL  Surgical pcr screen     Status: None   Collection Time: 03/31/15  9:56 AM  Result Value Ref Range   MRSA, PCR NEGATIVE NEGATIVE   Staphylococcus aureus NEGATIVE NEGATIVE  Heparin level (unfractionated)     Status: Abnormal   Collection Time: 03/31/15 11:00 PM  Result Value Ref Range   Heparin Unfractionated 0.24 (L) 0.30 - 0.70 IU/mL  Lipid panel     Status: Abnormal   Collection Time: 04/01/15  3:36 AM  Result Value Ref Range   Cholesterol 215 (H) 0 - 200 mg/dL   Triglycerides 119 (H) <150 mg/dL    HDL 41 >14 mg/dL   Total CHOL/HDL Ratio 5.2 RATIO   VLDL 42 (H) 0 - 40 mg/dL   LDL Cholesterol 782 (H) 0 - 99 mg/dL  CBC     Status: None   Collection Time: 04/01/15  3:36 AM  Result Value Ref Range   WBC 9.4 4.0 - 10.5 K/uL   RBC 4.78 4.22 - 5.81 MIL/uL   Hemoglobin 16.1 13.0 - 17.0 g/dL   HCT 95.6 21.3 - 08.6 %   MCV 96.2 78.0 - 100.0 fL   MCH 33.7 26.0 - 34.0 pg   MCHC 35.0 30.0 - 36.0 g/dL   RDW 57.8 46.9 - 62.9 %   Platelets 172 150 - 400 K/uL  Basic metabolic panel     Status: Abnormal   Collection Time: 04/01/15  3:36 AM  Result Value Ref Range   Sodium 139 135 - 145 mmol/L   Potassium 4.5 3.5 - 5.1 mmol/L   Chloride 104 101 - 111 mmol/L   CO2 27 22 - 32 mmol/L   Glucose, Bld 144 (H) 65 - 99 mg/dL   BUN 18 6 - 20 mg/dL   Creatinine, Ser 5.28 0.61 - 1.24 mg/dL   Calcium 9.1 8.9 - 41.3 mg/dL   GFR calc non Af  Amer >60 >60 mL/min   GFR calc Af Amer >60 >60 mL/min   Anion gap 8 5 - 15  Platelet inhibition p2y12     Status: Abnormal   Collection Time: 04/01/15  3:36 AM  Result Value Ref Range   Platelet Function  P2Y12 146 (L) 194 - 418 PRU    Intake/Output Summary (Last 24 hours) at 04/01/15 0828 Last data filed at 04/01/15 0700  Gross per 24 hour  Intake 1582.37 ml  Output   2700 ml  Net -1117.63 ml    EKG:  Atrial fibrillation, rate 127 bpm.  Tele: atrial fibrillation.  Rates all >100 bpm.  TTE 03/31/15: Study Conclusions  - Left ventricle: The cavity size was normal. Wall thickness was increased in a pattern of mild LVH. Systolic function was normal. The estimated ejection fraction was in the range of 55% to 60%. Images were inadequate for LV wall motion assessment. The study is not technically sufficient to allow evaluation of LV diastolic function. - Mitral valve: Calcified annulus. There was trivial regurgitation. - Left atrium: The atrium was normal in size. - Right atrium: The atrium was normal in size. - Tricuspid valve: There was  trivial regurgitation. - Pulmonary arteries: PA peak pressure: 30 mm Hg (S).  LHC 03/31/15: 1. Severe multivessel disease with heavy calcification of the proximal left and right coronary arteries. 2. LM lesion, 65% stenosed. 3. Mid LAD-1 lesion, 70% stenosed. Mid LAD-2 lesion, 90% stenosed prior to D1. Distmid LAD lesion, 80% stenosed. 4. Ost Cx to Prox Cx lesion, 75% stenosed. Mid Cx lesion, 95% stenosed. Dist Cx lesion, 70% stenosed. 1st Mrg lesion, 60% stenosed wtih 80% in smaller OM1a branch 5. Mid RCA lesion, 90% stenosed. Mid RCA to Dist RCA lesion, 100% stenosed - CTO. There is R-R collateralization from the RV Marginal branch to RPDA faintly filling the RPDA & small RPL. 6. The left ventricular systolic function is normal. 7. New onset Diagnosis of Afib - initially with RVR   ASSESSMENT AND PLAN:  Active Problems:   Essential hypertension   PAD (peripheral artery disease)   Aortic atherosclerosis   Progressive angina: Class III    Left main coronary artery disease    # Multivessel CAD/Angina Class III: Continues to have short episodes of CP.  Will start 1/2 inch of nitro paste.  May also be due to inadequate rate control - CABG Tuesday - Start metoprolol tartrate  po bid - Atorvastatin  - Continue heparin drip and ASA.  Holding Plavix  # Atrial fibrillation: Rates poorly controlled. Unfortunately he already ate breakfast and anesthesia is unavailable for DCCV this afternoon.   Will attempt to rate control - on amiodarone and heparin - start metoprolol  q12 hours - may need to start diltiazem drip if metoprolol does not reduce the rate <100 bpm  # Hypertension: BP poorly-controlled.  Starting metoprolol as above.    Chilton Si P 04/01/2015 8:28 AM

## 2015-04-02 ENCOUNTER — Inpatient Hospital Stay (HOSPITAL_COMMUNITY): Payer: Medicare Other

## 2015-04-02 DIAGNOSIS — Z01810 Encounter for preprocedural cardiovascular examination: Secondary | ICD-10-CM

## 2015-04-02 DIAGNOSIS — I2510 Atherosclerotic heart disease of native coronary artery without angina pectoris: Secondary | ICD-10-CM

## 2015-04-02 LAB — CBC
HCT: 40.7 % (ref 39.0–52.0)
Hemoglobin: 14 g/dL (ref 13.0–17.0)
MCH: 32.9 pg (ref 26.0–34.0)
MCHC: 34.4 g/dL (ref 30.0–36.0)
MCV: 95.8 fL (ref 78.0–100.0)
Platelets: 172 10*3/uL (ref 150–400)
RBC: 4.25 MIL/uL (ref 4.22–5.81)
RDW: 12.7 % (ref 11.5–15.5)
WBC: 7.7 10*3/uL (ref 4.0–10.5)

## 2015-04-02 LAB — HEPARIN LEVEL (UNFRACTIONATED): Heparin Unfractionated: 0.52 IU/mL (ref 0.30–0.70)

## 2015-04-02 MED ORDER — AMIODARONE HCL 200 MG PO TABS
400.0000 mg | ORAL_TABLET | Freq: Two times a day (BID) | ORAL | Status: DC
  Administered 2015-04-02 – 2015-04-03 (×4): 400 mg via ORAL
  Filled 2015-04-02 (×4): qty 2

## 2015-04-02 NOTE — Progress Notes (Signed)
VASCULAR LAB PRELIMINARY  PRELIMINARY  PRELIMINARY  PRELIMINARY  Pre-op Cardiac Surgery  Carotid Findings:  Bilateral:  1-39% ICA stenosis.  Vertebral artery flow is antegrade.      Thereasa Parkin, RVT 04/02/2015 9:49 AM    Upper Extremity Right Left  Brachial Pressures    Radial Waveforms pending   Ulnar Waveforms    Palmar Arch (Allen's Test)      Lower  Extremity Right Left  Dorsalis Pedis    Anterior Tibial    Posterior Tibial    Ankle/Brachial Indices

## 2015-04-02 NOTE — Progress Notes (Signed)
Patient ID: [name], male   DOB: 19-Jun-1946, 69 y.o.   MRN: [id number] TCTS DAILY ICU PROGRESS NOTE                   [address]            [name] [address]          [phone]   2 Days Post-Op Procedure(s) (LRB): Left Heart Cath and Coronary Angiography (N/A)  Total Length of Stay:  LOS: 2 days   Subjective: Now in sinus, feels well. No chest pain currently  Objective: Vital signs in last 24 hours: Temp:  [97.4 F (36.3 C)-98.2 F (36.8 C)] 98 F (36.7 C) (09/17 1257) Pulse Rate:  [60-71] 60 (09/17 1257) Cardiac Rhythm:  [-] Normal sinus rhythm (09/17 0800) Resp:  [16-18] 16 (09/17 1257) BP: (104-123)/(42-69) 123/69 mmHg (09/17 1257) SpO2:  [96 %-98 %] 97 % (09/17 1257)  Filed Weights   03/31/15 0550  Weight: 203 lb (92.08 kg)    Weight change:    Hemodynamic parameters for last 24 hours:    Intake/Output from previous day: 09/16 0701 - 09/17 0700 In: 1806.1 [P.O.:870; I.V.:936.1] Out: 250 [Urine:250]  Intake/Output this shift: Total I/O In: 598.9 [P.O.:240; I.V.:358.9] Out: -   Current Meds: Scheduled Meds: . amiodarone  400 mg Oral BID  . aspirin EC  81 mg Oral Daily  . atorvastatin  80 mg Oral q1800  . metoprolol tartrate  25 mg Oral BID  . sodium chloride  3 mL Intravenous Q12H   Continuous Infusions: . heparin 1,400 Units/hr (04/02/15 0800)   PRN Meds:.sodium chloride, acetaminophen, morphine injection, ondansetron (ZOFRAN) IV, sodium chloride, zolpidem  General appearance: alert and cooperative Neurologic: intact Heart: regular rate and rhythm, S1, S2 normal, no murmur, click, rub or gallop Lungs: clear to auscultation bilaterally Abdomen: soft, non-tender; bowel sounds normal; no masses,  no organomegaly Extremities: decreased pedal pulses bilaterial  Lab Results: CBC: Recent Labs  04/01/15 0336 04/02/15 0405  WBC 9.4 7.7  HGB 16.1 14.0  HCT 46.0 40.7  PLT 172 172   BMET:  Recent Labs  04/01/15 0336    NA 139  K 4.5  CL 104  CO2 27  GLUCOSE 144*  BUN 18  CREATININE 1.11  CALCIUM 9.1    PT/INR: No results for input(s): LABPROT, INR in the last 72 hours. Radiology: No results found.  P2Y12 to be repeated tomorrow ,  Was 146     Assessment/Plan: S/P Procedure(s) (LRB): Left Heart Cath and Coronary Angiography (N/A) Discussed planned CABG with patient and wife and family answered  their questions  Likely Tuesday  I  spent 25 minutes counseling the patient face to face and 50% or more the  time was spent in counseling and coordination of care. The total time spent in the appointment was 30 minutes.    [name] 04/02/2015 2:10 PM

## 2015-04-02 NOTE — Progress Notes (Signed)
ANTICOAGULATION CONSULT NOTE - Follow Up Consult  Pharmacy Consult for heparin Indication: atrial fibrillation  No Known Allergies  Patient Measurements: Height:  (175.3 cm) Weight: 203 lb (92.08 kg) IBW/kg (Calculated) : 70.7 Heparin Dosing Weight: 92 kg  Vital Signs: Temp: 98.2 F (36.8 C) (09/17 0828) Temp Source: Oral (09/17 0828) BP: 104/56 mmHg (09/17 0828) Pulse Rate: 71 (09/17 0828)  Labs:  Recent Labs  03/31/15 2300 04/01/15 0336 04/01/15 1020 04/02/15 0405 04/02/15 0409  HGB  --  16.1  --  14.0  --   HCT  --  46.0  --  40.7  --   PLT  --  172  --  172  --   HEPARINUNFRC 0.24*  --  0.50  --  0.52  CREATININE  --  1.11  --   --   --     Assessment: 69 yo m admitted on 9/15 for cath and found to have severe CAD, for CABG on 9/20.  Pharmacy is consulted to dose heparin.  HL therapeutic at 0.52 on 1400 units/hr. No issues or bleeding per RN, CBC wnl and stable.  Goal of Therapy:  Heparin level 0.3-0.7 units/ml Monitor platelets by anticoagulation protocol: Yes   Plan:  Continue heparin at 1400 units/hr Daily HL and CBC Monitor for s/s of bleeding  Cassie L. Roseanne Reno, PharmD Clinical Pharmacy Resident Pager: [phone] 04/02/2015 8:57 AM

## 2015-04-02 NOTE — Progress Notes (Signed)
       Patient Name: [name] Date of Encounter: 04/02/2015    SUBJECTIVE: Having headache related to Nitropaste. Headache is associated with nausea. No chest discomfort. Denies dyspnea.  TELEMETRY:  Normal sinus rhythm. On IV amiodarone. Filed Vitals:   04/01/15 2355 04/02/15 0400 04/02/15 0455 04/02/15 0828  BP: 104/42  105/52 104/56  Pulse:    71  Temp:  97.7 F (36.5 C)  98.2 F (36.8 C)  TempSrc:  Oral  Oral  Resp:    16  Height:      Weight:      SpO2: 98%  96% 97%    Intake/Output Summary (Last 24 hours) at 04/02/15 1119 Last data filed at 04/02/15 0900  Gross per 24 hour  Intake 1405.4 ml  Output      0 ml  Net 1405.4 ml   LABS: Basic Metabolic Panel:  Recent Labs  16/10/96 0336  NA 139  K 4.5  CL 104  CO2 27  GLUCOSE 144*  BUN 18  CREATININE 1.11  CALCIUM 9.1   CBC:  Recent Labs  04/01/15 0336 04/02/15 0405  WBC 9.4 7.7  HGB 16.1 14.0  HCT 46.0 40.7  MCV 96.2 95.8  PLT 172 172   Fasting Lipid Panel:  Recent Labs  04/01/15 0336  CHOL 215*  HDL 41  LDLCALC 132*  TRIG 211*  CHOLHDL 5.2    Radiology Studies:  Mild cardiac enlargement on chest x-ray done 03/23/15  Physical Exam: Blood pressure 104/56, pulse 71, temperature 98.2 F (36.8 C), temperature source Oral, resp. rate 16, height  (1.753 m), weight 92.08 kg (203 lb), SpO2 97 %. Weight change:   Wt Readings from Last 3 Encounters:  03/31/15 92.08 kg (203 lb)  03/23/15 94.915 kg (209 lb 4 oz)  03/22/15 95.482 kg (210 lb 8 oz)    Neck veins nondistended Lungs are clear Cardiac exam has an S4 gallop Extremities reveal no edema and the cath site is unremarkable   ASSESSMENT:  1. Headache and nausea related to long-acting nitrates 2. Severe multivessel coronary disease including left main with class III angina. Awaiting coronary bypass grafting after washed out of Plavix. 3. Peripheral vascular disease with claudication, stable 4. Essential hypertension with  relative low blood pressures 5. Paroxysmal atrial fibrillation, now reverted to normal sinus rhythm  Plan:  1. Convert amiodarone to oral dosing 2. Discontinue long-acting nitrates 3. Clinical follow-up until surgery 4. Will be allowed to ambulate assuming no symptoms  Signed, Lesleigh Noe 04/02/2015, 11:19 AM

## 2015-04-03 ENCOUNTER — Inpatient Hospital Stay (HOSPITAL_COMMUNITY): Payer: Medicare Other

## 2015-04-03 DIAGNOSIS — G441 Vascular headache, not elsewhere classified: Secondary | ICD-10-CM

## 2015-04-03 DIAGNOSIS — K5909 Other constipation: Secondary | ICD-10-CM

## 2015-04-03 LAB — BLOOD GAS, ARTERIAL
Acid-Base Excess: 1.7 mmol/L (ref 0.0–2.0)
Bicarbonate: 25.6 mEq/L — ABNORMAL HIGH (ref 20.0–24.0)
Drawn by: 28340
FIO2: 21
O2 Saturation: 94.3 %
Patient temperature: 98.6
TCO2: 26.8 mmol/L (ref 0–100)
pCO2 arterial: 39.3 mmHg (ref 35.0–45.0)
pH, Arterial: 7.429 (ref 7.350–7.450)
pO2, Arterial: 70.7 mmHg — ABNORMAL LOW (ref 80.0–100.0)

## 2015-04-03 LAB — URINALYSIS, ROUTINE W REFLEX MICROSCOPIC
Bilirubin Urine: NEGATIVE
Glucose, UA: NEGATIVE mg/dL
Hgb urine dipstick: NEGATIVE
Ketones, ur: NEGATIVE mg/dL
Leukocytes, UA: NEGATIVE
Nitrite: NEGATIVE
Protein, ur: NEGATIVE mg/dL
Specific Gravity, Urine: 1.015 (ref 1.005–1.030)
Urobilinogen, UA: 0.2 mg/dL (ref 0.0–1.0)
pH: 6.5 (ref 5.0–8.0)

## 2015-04-03 LAB — CBC
HCT: 42.6 % (ref 39.0–52.0)
Hemoglobin: 14.7 g/dL (ref 13.0–17.0)
MCH: 33.3 pg (ref 26.0–34.0)
MCHC: 34.5 g/dL (ref 30.0–36.0)
MCV: 96.4 fL (ref 78.0–100.0)
Platelets: 180 10*3/uL (ref 150–400)
RBC: 4.42 MIL/uL (ref 4.22–5.81)
RDW: 12.7 % (ref 11.5–15.5)
WBC: 8.7 10*3/uL (ref 4.0–10.5)

## 2015-04-03 LAB — HEPARIN LEVEL (UNFRACTIONATED)
Heparin Unfractionated: 0.7 IU/mL (ref 0.30–0.70)
Heparin Unfractionated: 0.76 IU/mL — ABNORMAL HIGH (ref 0.30–0.70)

## 2015-04-03 LAB — TYPE AND SCREEN
ABO/RH(D): B POS
Antibody Screen: NEGATIVE

## 2015-04-03 LAB — PROTIME-INR
INR: 1.08 (ref 0.00–1.49)
Prothrombin Time: 14.2 seconds (ref 11.6–15.2)

## 2015-04-03 LAB — PLATELET INHIBITION P2Y12: Platelet Function  P2Y12: 219 [PRU] (ref 194–418)

## 2015-04-03 LAB — APTT: aPTT: 123 seconds — ABNORMAL HIGH (ref 24–37)

## 2015-04-03 LAB — SURGICAL PCR SCREEN
MRSA, PCR: NEGATIVE
Staphylococcus aureus: NEGATIVE

## 2015-04-03 MED ORDER — SODIUM CHLORIDE 0.9 % IV SOLN
INTRAVENOUS | Status: AC
  Administered 2015-04-04: 14 mL/h via INTRAVENOUS
  Filled 2015-04-03: qty 40

## 2015-04-03 MED ORDER — CHLORHEXIDINE GLUCONATE 4 % EX LIQD
60.0000 mL | Freq: Once | CUTANEOUS | Status: AC
  Administered 2015-04-04: 4 via TOPICAL
  Filled 2015-04-03: qty 60

## 2015-04-03 MED ORDER — HYDROCODONE-ACETAMINOPHEN 5-325 MG PO TABS: 1.0000 | ORAL_TABLET | Freq: Four times a day (QID) | ORAL | Status: DC | PRN

## 2015-04-03 MED ORDER — SODIUM CHLORIDE 0.9 % IV SOLN
INTRAVENOUS | Status: AC
  Administered 2015-04-04: 1 [IU]/h via INTRAVENOUS
  Filled 2015-04-03: qty 2.5

## 2015-04-03 MED ORDER — NITROGLYCERIN IN D5W 200-5 MCG/ML-% IV SOLN
2.0000 ug/min | INTRAVENOUS | Status: AC
  Administered 2015-04-04: 5 ug/min via INTRAVENOUS
  Filled 2015-04-03: qty 250

## 2015-04-03 MED ORDER — EPINEPHRINE HCL 1 MG/ML IJ SOLN
0.0000 ug/min | INTRAVENOUS | Status: DC
  Filled 2015-04-03: qty 4

## 2015-04-03 MED ORDER — DEXTROSE 5 % IV SOLN
750.0000 mg | INTRAVENOUS | Status: DC
  Filled 2015-04-03: qty 750

## 2015-04-03 MED ORDER — PLASMA-LYTE 148 IV SOLN
INTRAVENOUS | Status: AC
  Administered 2015-04-04: 500 mL
  Filled 2015-04-03: qty 2.5

## 2015-04-03 MED ORDER — SODIUM CHLORIDE 0.9 % IV SOLN
INTRAVENOUS | Status: DC
  Filled 2015-04-03: qty 30

## 2015-04-03 MED ORDER — DEXTROSE 5 % IV SOLN
1.5000 g | INTRAVENOUS | Status: AC
  Administered 2015-04-04: 750 g via INTRAVENOUS
  Administered 2015-04-04: 1.5 g via INTRAVENOUS
  Filled 2015-04-03: qty 1.5

## 2015-04-03 MED ORDER — ALPRAZOLAM 0.25 MG PO TABS
0.2500 mg | ORAL_TABLET | Freq: Four times a day (QID) | ORAL | Status: DC | PRN
  Administered 2015-04-03: 0.25 mg via ORAL
  Filled 2015-04-03: qty 1

## 2015-04-03 MED ORDER — BISACODYL 5 MG PO TBEC: 5.0000 mg | DELAYED_RELEASE_TABLET | Freq: Once | ORAL | Status: DC

## 2015-04-03 MED ORDER — MAGNESIUM SULFATE 50 % IJ SOLN
40.0000 meq | INTRAMUSCULAR | Status: DC
  Filled 2015-04-03: qty 10

## 2015-04-03 MED ORDER — DEXMEDETOMIDINE HCL IN NACL 400 MCG/100ML IV SOLN
0.1000 ug/kg/h | INTRAVENOUS | Status: AC
  Administered 2015-04-04: .2 ug/kg/h via INTRAVENOUS
  Filled 2015-04-03: qty 100

## 2015-04-03 MED ORDER — CHLORHEXIDINE GLUCONATE 0.12 % MT SOLN
15.0000 mL | Freq: Once | OROMUCOSAL | Status: AC
Start: 2015-04-04 — End: 2015-04-04
  Administered 2015-04-04: 15 mL via OROMUCOSAL
  Filled 2015-04-03: qty 15

## 2015-04-03 MED ORDER — POTASSIUM CHLORIDE 2 MEQ/ML IV SOLN
80.0000 meq | INTRAVENOUS | Status: DC
  Filled 2015-04-03: qty 40

## 2015-04-03 MED ORDER — DOPAMINE-DEXTROSE 3.2-5 MG/ML-% IV SOLN
0.0000 ug/kg/min | INTRAVENOUS | Status: AC
  Administered 2015-04-04: 3 ug/kg/min via INTRAVENOUS
  Filled 2015-04-03: qty 250

## 2015-04-03 MED ORDER — MAGNESIUM HYDROXIDE 400 MG/5ML PO SUSP
30.0000 mL | Freq: Every day | ORAL | Status: DC | PRN
  Administered 2015-04-03: 30 mL via ORAL
  Filled 2015-04-03: qty 30

## 2015-04-03 MED ORDER — TEMAZEPAM 15 MG PO CAPS: 15.0000 mg | ORAL_CAPSULE | Freq: Once | ORAL | Status: DC | PRN

## 2015-04-03 MED ORDER — CHLORHEXIDINE GLUCONATE 4 % EX LIQD
60.0000 mL | Freq: Once | CUTANEOUS | Status: AC
  Administered 2015-04-03: 4 via TOPICAL
  Filled 2015-04-03: qty 60

## 2015-04-03 MED ORDER — METOPROLOL TARTRATE 12.5 MG HALF TABLET
12.5000 mg | ORAL_TABLET | Freq: Once | ORAL | Status: AC
  Administered 2015-04-04: 12.5 mg via ORAL
  Filled 2015-04-03: qty 1

## 2015-04-03 MED ORDER — PHENYLEPHRINE HCL 10 MG/ML IJ SOLN
30.0000 ug/min | INTRAVENOUS | Status: AC
  Administered 2015-04-04: 40 ug/min via INTRAVENOUS
  Filled 2015-04-03: qty 2

## 2015-04-03 MED ORDER — VANCOMYCIN HCL 10 G IV SOLR
1500.0000 mg | INTRAVENOUS | Status: AC
  Administered 2015-04-04: 1500 mg via INTRAVENOUS
  Filled 2015-04-03: qty 1500

## 2015-04-03 NOTE — Progress Notes (Signed)
Patient ID: [name], male   DOB: [birth year], 69 y.o.   MRN: [id number]      [address]       [place] [id number]             [phone]                 3 Days Post-Op Procedure(s) (LRB): Left Heart Cath and Coronary Angiography (N/A)  LOS: 3 days   Subjective: No chest pain today, walking in unit, holding sinus  Objective: Vital signs in last 24 hours: Patient Vitals for the past 24 hrs:  BP Temp Temp src Pulse Resp SpO2  04/03/15 1715 140/66 mmHg 97.6 F (36.4 C) Oral - 16 95 %  04/03/15 1204 114/69 mmHg 97.5 F (36.4 C) Oral - 16 94 %  04/03/15 0915 (!) 109/50 mmHg - - - - -  04/03/15 0742 137/69 mmHg 98.2 F (36.8 C) Oral - 16 95 %  04/03/15 0318 111/60 mmHg 97.8 F (36.6 C) Oral 68 16 93 %  04/02/15 2327 (!) 116/45 mmHg 97.5 F (36.4 C) Oral 74 16 96 %  04/02/15 2214 (!) 142/65 mmHg - - 72 - -  04/02/15 2025 - 97.7 F (36.5 C) Oral - - -  04/02/15 1922 121/62 mmHg - - - - 93 %    Filed Weights   03/31/15 0550  Weight: 203 lb (92.08 kg)    Hemodynamic parameters for last 24 hours:    Intake/Output from previous day: 09/17 0701 - 09/18 0700 In: 1222.9 [P.O.:480; I.V.:742.9] Out: 150 [Urine:150] Intake/Output this shift: Total I/O In: 620.4 [P.O.:360; I.V.:260.4] Out: 100 [Urine:100]  Scheduled Meds: . amiodarone  400 mg Oral BID  . aspirin EC  81 mg Oral Daily  . atorvastatin  80 mg Oral q1800  . metoprolol tartrate  25 mg Oral BID  . sodium chloride  3 mL Intravenous Q12H   Continuous Infusions: . heparin 1,350 Units/hr (04/03/15 0942)   PRN Meds:.sodium chloride, acetaminophen, HYDROcodone-acetaminophen, magnesium hydroxide, morphine injection, ondansetron (ZOFRAN) IV, sodium chloride, zolpidem  General appearance: alert and cooperative Neurologic: intact Heart: regular rate and rhythm, S1, S2 normal, no murmur, click, rub or gallop Lungs: clear to auscultation bilaterally Abdomen: soft, non-tender; bowel sounds normal;  no masses,  no organomegaly Extremities: extremities normal, atraumatic, no cyanosis or edema and Homans sign is negative, no sign of DVT  Lab Results: CBC: Recent Labs  04/02/15 0405 04/03/15 0316  WBC 7.7 8.7  HGB 14.0 14.7  HCT 40.7 42.6  PLT 172 180   BMET:  Recent Labs  04/01/15 0336  NA 139  K 4.5  CL 104  CO2 27  GLUCOSE 144*  BUN 18  CREATININE 1.11  CALCIUM 9.1    PT/INR: No results for input(s): LABPROT, INR in the last 72 hours.   Radiology Dg Chest 2 View  03/23/2015   CLINICAL DATA:  Preop evaluation, history of coronary artery disease  EXAM: CHEST  2 VIEW  COMPARISON:  None.  FINDINGS: Mild cardiac enlargement. Vascular pattern normal. No infiltrate or effusion.  IMPRESSION: Mild cardiac enlargement.   Electronically Signed   By: Esperanza Heir M.D.   On: 03/23/2015 17:13   Assessment/Plan: S/P Procedure(s) (LRB): Left Heart Cath and Coronary Angiography (N/A) Plan CABG in am, P2Y12 moving upward   The goals risks and alternatives of the planned surgical procedure CABG  have been discussed with the patient in detail. The risks of the procedure including death,  infection, stroke, myocardial infarction, bleeding, blood transfusion have all been discussed specifically.  I have quoted [name] a 3 % of perioperative mortality and a complication rate as high as 35 %. The patient's questions have been answered.[name] is willing  to proceed with the planned procedure.  [name] MD 04/03/2015 6:23 PM

## 2015-04-03 NOTE — Progress Notes (Signed)
VASCULAR LAB PRELIMINARY  PRELIMINARY  PRELIMINARY  PRELIMINARY  Pre-op Cardiac Surgery  Carotid Findings:  Bilateral:  1-39% ICA stenosis.  Vertebral artery flow is antegrade.      Thereasa Parkin, RVT 04/03/2015 2:24 PM    Upper Extremity Right Left  Brachial Pressures 138  Triphasic  140  Triphasic   Radial Waveforms Triphasic Triphasic  Ulnar Waveforms Triphasic  Triphasic   Palmar Arch (Allen's Test) Within normal limits  Doppler decreases <50% with radial compression, normal with unlar compression    Lower  Extremity Right Left  Dorsalis Pedis 75 Monophasic  85  Monophasic   Anterior Tibial    Posterior Tibial 82 Monophasic  80  Monophasic   Ankle/Brachial Indices 0.58  Moderate decrease 0.61  Moderate decrease      Chue Berkovich, RVT 04/03/2015 2:28 PM

## 2015-04-03 NOTE — Progress Notes (Addendum)
ANTICOAGULATION CONSULT NOTE - Follow Up Consult  Pharmacy Consult for heparin Indication: atrial fibrillation  No Known Allergies  Patient Measurements: Height:  (175.3 cm) Weight: 203 lb (92.08 kg) IBW/kg (Calculated) : 70.7 Heparin Dosing Weight: 92 kg  Vital Signs: Temp: 98.2 F (36.8 C) (09/18 0742) Temp Source: Oral (09/18 0742) BP: 109/50 mmHg (09/18 0915) Pulse Rate: 68 (09/18 0318)  Labs:  Recent Labs  04/01/15 0336 04/01/15 1020 04/02/15 0405 04/02/15 0409 04/03/15 0316  HGB 16.1  --  14.0  --  14.7  HCT 46.0  --  40.7  --  42.6  PLT 172  --  172  --  180  HEPARINUNFRC  --  0.50  --  0.52 0.70  CREATININE 1.11  --   --   --   --     Assessment: 70 yo m admitted on 9/15 for cath and found to have severe CAD, for CABG on 9/20. Pharmacy is consulted to dose heparin.  HL this AM is on the high end of therapeutic at 0.70 on 1400 units/hr.  Due to patient age, will turn down rate just a little since HL has been trending up. CBC ok, no issues per RN.  Goal of Therapy:  Heparin level 0.3-0.7 units/ml Monitor platelets by anticoagulation protocol: Yes   Plan:  Decrease heparin infusion to 1350 units/hr F/u 8-hr HL @ 1800 Monitor for s/s of bleeding CABG on Tuesday  Cassie L. Roseanne Reno, PharmD Clinical Pharmacy Resident Pager: [phone] 04/03/2015 9:40 AM    Addendum  -HL supratherapeutic -Reduce heparin to 1200 units/hr -Monitor s/sx bleeding   Baldemar Friday  04/03/2015. 8:29 PM

## 2015-04-03 NOTE — Anesthesia Preprocedure Evaluation (Addendum)
Anesthesia Evaluation  Patient identified by MRN, date of birth, ID band Patient awake    Reviewed: Allergy & Precautions, NPO status , Patient's Chart, lab work & pertinent test results, reviewed documented beta blocker date and time   Airway Mallampati: II  TM Distance: >3 FB Neck ROM: Full    Dental no notable dental hx.    Pulmonary neg pulmonary ROS, former smoker,    Pulmonary exam normal breath sounds clear to auscultation       Cardiovascular hypertension, Pt. on medications and Pt. on home beta blockers + angina + CAD and + Peripheral Vascular Disease  Normal cardiovascular exam Rhythm:Regular Rate:Normal  Echo 03/31/2015 - Left ventricle: The cavity size was normal. Wall thickness wasincreased in a pattern of mild LVH. Systolic function was normal.The estimated ejection fraction was in the range of 55% to 60%.Images were inadequate for LV wall motion assessment. The studyis not technically sufficient to allow evaluation of LV diastolicfunction. - Mitral valve: Calcified annulus. There was trivial regurgitation. - Left atrium: The atrium was normal in size. - Right atrium: The atrium was normal in size. - Tricuspid valve: There was trivial regurgitation. - Pulmonary arteries: PA peak pressure: 30 mm Hg (S).  Impressions: - LVEF 55-60%, mild LVH, normal biatrial size, trivial TR, RVSP .     Neuro/Psych negative neurological ROS  negative psych ROS   GI/Hepatic negative GI ROS, Neg liver ROS,   Endo/Other  diabetes, Type 2  Renal/GU negative Renal ROS     Musculoskeletal  (+) Arthritis ,   Abdominal   Peds  Hematology negative hematology ROS (+)   Anesthesia Other Findings   Reproductive/Obstetrics negative OB ROS                           Anesthesia Physical Anesthesia Plan  ASA: IV  Anesthesia Plan: General   Post-op Pain Management:    Induction:  Intravenous  Airway Management Planned: Oral ETT  Additional Equipment: PA Cath, Arterial line, TEE, CVP and Ultrasound Guidance Line Placement  Intra-op Plan:   Post-operative Plan: Post-operative intubation/ventilation  Informed Consent: I have reviewed the patients History and Physical, chart, labs and discussed the procedure including the risks, benefits and alternatives for the proposed anesthesia with the patient or authorized representative who has indicated his/her understanding and acceptance.   Dental advisory given  Plan Discussed with: CRNA  Anesthesia Plan Comments:         Anesthesia Quick Evaluation

## 2015-04-03 NOTE — Progress Notes (Addendum)
       Patient Name: [name] Date of Encounter: 04/03/2015    SUBJECTIVE: Having headache related to Nitropaste. Slight improvement overnight but has not completely resolved. Headache is associated with nausea.  No chest discomfort.  Denies dyspnea. Ambulating without difficulty.  TELEMETRY:  Normal sinus rhythm. On IV amiodarone. Filed Vitals:   04/02/15 2327 04/03/15 0318 04/03/15 0742 04/03/15 0915  BP: 116/45 111/60 137/69 109/50  Pulse: 74 68    Temp: 97.5 F (36.4 C) 97.8 F (36.6 C) 98.2 F (36.8 C)   TempSrc: Oral Oral Oral   Resp: Height:      Weight:      SpO2: 96% 93% 95%     Intake/Output Summary (Last 24 hours) at 04/03/15 1022 Last data filed at 04/03/15 0900  Gross per 24 hour  Intake 1252.1 ml  Output    150 ml  Net 1102.1 ml   LABS: Basic Metabolic Panel:  Recent Labs  44/03/47 0336  NA 139  K 4.5  CL 104  CO2 27  GLUCOSE 144*  BUN 18  CREATININE 1.11  CALCIUM 9.1   CBC:  Recent Labs  04/02/15 0405 04/03/15 0316  WBC 7.7 8.7  HGB 14.0 14.7  HCT 40.7 42.6  MCV 95.8 96.4  PLT 172 180   Fasting Lipid Panel:  Recent Labs  04/01/15 0336  CHOL 215*  HDL 41  LDLCALC 132*  TRIG 211*  CHOLHDL 5.2    Radiology Studies: No new data  Physical Exam: Blood pressure 109/50, pulse 68, temperature 98.2 F (36.8 C), temperature source Oral, resp. rate 16, height  (1.753 m), weight 92.08 kg (203 lb), SpO2 95 %. Weight change:   Wt Readings from Last 3 Encounters:  03/31/15 92.08 kg (203 lb)  03/23/15 94.915 kg (209 lb 4 oz)  03/22/15 95.482 kg (210 lb 8 oz)    Neck veins nondistended Lungs are clear Cardiac exam has an S4 gallop Extremities reveal no edema and the cath site is unremarkable   ASSESSMENT:  1. Headache and nausea improved off nitrates but still present 2. Severe multivessel coronary disease including left main with class III angina. Awaiting coronary bypass grafting after washed out of  Plavix. 3. Peripheral vascular disease with claudication, stable. He has recent ABI contained within this chart. Does not need new studies. 4. Essential hypertension with good blood pressure on current regimen. 5. Paroxysmal atrial fibrillation, now reverted to normal sinus rhythm on amiodarone 6. Constipation  Plan:  1. Converted to oral amiodarone yesterday 2. Tylenol for headache 3. Clinical follow-up until surgery 4. Will be allowed to ambulate assuming no symptoms 5. Medical magnesia for constipation  Signed, Lesleigh Noe 04/03/2015, 10:22 AM

## 2015-04-04 ENCOUNTER — Inpatient Hospital Stay (HOSPITAL_COMMUNITY)
Admission: RE | Admit: 2015-04-04 | Discharge: 2015-04-04 | Disposition: A | Discharge status: Home / Self Care | Payer: Medicare Other | Source: Ambulatory Visit | Attending: Cardiothoracic Surgery | Admitting: Cardiothoracic Surgery

## 2015-04-04 ENCOUNTER — Inpatient Hospital Stay (HOSPITAL_COMMUNITY): Payer: Medicare Other

## 2015-04-04 ENCOUNTER — Inpatient Hospital Stay (HOSPITAL_COMMUNITY): Payer: Medicare Other | Admitting: Anesthesiology

## 2015-04-04 ENCOUNTER — Encounter (HOSPITAL_COMMUNITY)
Admission: RE | Disposition: A | Discharge status: Home Health | Payer: Medicare Other | Source: Ambulatory Visit | Attending: Cardiothoracic Surgery

## 2015-04-04 DIAGNOSIS — Z951 Presence of aortocoronary bypass graft: Secondary | ICD-10-CM

## 2015-04-04 HISTORY — PX: CORONARY ARTERY BYPASS GRAFT: SHX141

## 2015-04-04 HISTORY — DX: Presence of aortocoronary bypass graft: Z95.1

## 2015-04-04 HISTORY — PX: TEE WITHOUT CARDIOVERSION: SHX5443

## 2015-04-04 LAB — CBC
HCT: 42.4 % (ref 39.0–52.0)
HCT: 39.3 % (ref 39.0–52.0)
HCT: 34.5 % — ABNORMAL LOW (ref 39.0–52.0)
Hemoglobin: 14.3 g/dL (ref 13.0–17.0)
Hemoglobin: 13.7 g/dL (ref 13.0–17.0)
Hemoglobin: 11.9 g/dL — ABNORMAL LOW (ref 13.0–17.0)
MCH: 32.6 pg (ref 26.0–34.0)
MCH: 33.2 pg (ref 26.0–34.0)
MCH: 32.6 pg (ref 26.0–34.0)
MCHC: 33.7 g/dL (ref 30.0–36.0)
MCHC: 34.9 g/dL (ref 30.0–36.0)
MCHC: 34.5 g/dL (ref 30.0–36.0)
MCV: 96.8 fL (ref 78.0–100.0)
MCV: 95.2 fL (ref 78.0–100.0)
MCV: 94.5 fL (ref 78.0–100.0)
Platelets: 181 10*3/uL (ref 150–400)
Platelets: 126 10*3/uL — ABNORMAL LOW (ref 150–400)
Platelets: 100 10*3/uL — ABNORMAL LOW (ref 150–400)
RBC: 4.38 MIL/uL (ref 4.22–5.81)
RBC: 4.13 MIL/uL — ABNORMAL LOW (ref 4.22–5.81)
RBC: 3.65 MIL/uL — ABNORMAL LOW (ref 4.22–5.81)
RDW: 12.7 % (ref 11.5–15.5)
RDW: 12.4 % (ref 11.5–15.5)
RDW: 12.4 % (ref 11.5–15.5)
WBC: 7.9 10*3/uL (ref 4.0–10.5)
WBC: 24.9 10*3/uL — ABNORMAL HIGH (ref 4.0–10.5)
WBC: 16.4 10*3/uL — ABNORMAL HIGH (ref 4.0–10.5)

## 2015-04-04 LAB — BASIC METABOLIC PANEL
Anion gap: 8 (ref 5–15)
BUN: 20 mg/dL (ref 6–20)
CO2: 28 mmol/L (ref 22–32)
Calcium: 9.3 mg/dL (ref 8.9–10.3)
Chloride: 104 mmol/L (ref 101–111)
Creatinine, Ser: 1.24 mg/dL (ref 0.61–1.24)
GFR calc Af Amer: >60 mL/min (ref >60)
GFR calc non Af Amer: 57 mL/min — ABNORMAL LOW (ref >60)
Glucose, Bld: 109 mg/dL — ABNORMAL HIGH (ref 65–99)
Potassium: 4.1 mmol/L (ref 3.5–5.1)
Sodium: 140 mmol/L (ref 135–145)

## 2015-04-04 LAB — POCT I-STAT GLUCOSE
Glucose, Bld: 126 mg/dL — ABNORMAL HIGH (ref 65–99)
Operator id: 195151

## 2015-04-04 LAB — POCT I-STAT, CHEM 8
BUN: <3 mg/dL — ABNORMAL LOW (ref 6–20)
BUN: 14 mg/dL (ref 6–20)
BUN: 17 mg/dL (ref 6–20)
BUN: 17 mg/dL (ref 6–20)
BUN: 18 mg/dL (ref 6–20)
BUN: 17 mg/dL (ref 6–20)
BUN: 16 mg/dL (ref 6–20)
Calcium, Ion: 1.25 mmol/L (ref 1.13–1.30)
Calcium, Ion: 0.87 mmol/L — ABNORMAL LOW (ref 1.13–1.30)
Calcium, Ion: 1.13 mmol/L (ref 1.13–1.30)
Calcium, Ion: 1.04 mmol/L — ABNORMAL LOW (ref 1.13–1.30)
Calcium, Ion: 1.11 mmol/L — ABNORMAL LOW (ref 1.13–1.30)
Calcium, Ion: 1.06 mmol/L — ABNORMAL LOW (ref 1.13–1.30)
Calcium, Ion: 1.07 mmol/L — ABNORMAL LOW (ref 1.13–1.30)
Chloride: 98 mmol/L — ABNORMAL LOW (ref 101–111)
Chloride: 101 mmol/L (ref 101–111)
Chloride: 110 mmol/L (ref 101–111)
Chloride: 101 mmol/L (ref 101–111)
Chloride: 102 mmol/L (ref 101–111)
Chloride: 101 mmol/L (ref 101–111)
Chloride: 106 mmol/L (ref 101–111)
Creatinine, Ser: 1 mg/dL (ref 0.61–1.24)
Creatinine, Ser: 0.6 mg/dL — ABNORMAL LOW (ref 0.61–1.24)
Creatinine, Ser: 0.9 mg/dL (ref 0.61–1.24)
Creatinine, Ser: 0.9 mg/dL (ref 0.61–1.24)
Creatinine, Ser: 0.9 mg/dL (ref 0.61–1.24)
Creatinine, Ser: 0.8 mg/dL (ref 0.61–1.24)
Creatinine, Ser: 0.8 mg/dL (ref 0.61–1.24)
Glucose, Bld: 115 mg/dL — ABNORMAL HIGH (ref 65–99)
Glucose, Bld: 102 mg/dL — ABNORMAL HIGH (ref 65–99)
Glucose, Bld: 115 mg/dL — ABNORMAL HIGH (ref 65–99)
Glucose, Bld: 140 mg/dL — ABNORMAL HIGH (ref 65–99)
Glucose, Bld: 169 mg/dL — ABNORMAL HIGH (ref 65–99)
Glucose, Bld: 195 mg/dL — ABNORMAL HIGH (ref 65–99)
Glucose, Bld: 142 mg/dL — ABNORMAL HIGH (ref 65–99)
HCT: 40 % (ref 39.0–52.0)
HCT: 29 % — ABNORMAL LOW (ref 39.0–52.0)
HCT: 37 % — ABNORMAL LOW (ref 39.0–52.0)
HCT: 30 % — ABNORMAL LOW (ref 39.0–52.0)
HCT: 33 % — ABNORMAL LOW (ref 39.0–52.0)
HCT: 29 % — ABNORMAL LOW (ref 39.0–52.0)
HCT: 35 % — ABNORMAL LOW (ref 39.0–52.0)
Hemoglobin: 13.6 g/dL (ref 13.0–17.0)
Hemoglobin: 12.6 g/dL — ABNORMAL LOW (ref 13.0–17.0)
Hemoglobin: 9.9 g/dL — ABNORMAL LOW (ref 13.0–17.0)
Hemoglobin: 10.2 g/dL — ABNORMAL LOW (ref 13.0–17.0)
Hemoglobin: 11.2 g/dL — ABNORMAL LOW (ref 13.0–17.0)
Hemoglobin: 9.9 g/dL — ABNORMAL LOW (ref 13.0–17.0)
Hemoglobin: 11.9 g/dL — ABNORMAL LOW (ref 13.0–17.0)
Potassium: 4 mmol/L (ref 3.5–5.1)
Potassium: 4.1 mmol/L (ref 3.5–5.1)
Potassium: 4.4 mmol/L (ref 3.5–5.1)
Potassium: 4.3 mmol/L (ref 3.5–5.1)
Potassium: 4.5 mmol/L (ref 3.5–5.1)
Potassium: 3.6 mmol/L (ref 3.5–5.1)
Potassium: 3.8 mmol/L (ref 3.5–5.1)
Sodium: 141 mmol/L (ref 135–145)
Sodium: 138 mmol/L (ref 135–145)
Sodium: 140 mmol/L (ref 135–145)
Sodium: 135 mmol/L (ref 135–145)
Sodium: 135 mmol/L (ref 135–145)
Sodium: 139 mmol/L (ref 135–145)
Sodium: 141 mmol/L (ref 135–145)
TCO2: 26 mmol/L (ref 0–100)
TCO2: 27 mmol/L (ref 0–100)
TCO2: 28 mmol/L (ref 0–100)
TCO2: 29 mmol/L (ref 0–100)
TCO2: 27 mmol/L (ref 0–100)
TCO2: 23 mmol/L (ref 0–100)
TCO2: 20 mmol/L (ref 0–100)

## 2015-04-04 LAB — POCT I-STAT 3, ART BLOOD GAS (G3+)
Acid-base deficit: 1 mmol/L (ref 0.0–2.0)
Acid-base deficit: 3 mmol/L — ABNORMAL HIGH (ref 0.0–2.0)
Acid-base deficit: 4 mmol/L — ABNORMAL HIGH (ref 0.0–2.0)
Acid-base deficit: 5 mmol/L — ABNORMAL HIGH (ref 0.0–2.0)
Acid-base deficit: 1 mmol/L (ref 0.0–2.0)
Acid-base deficit: 3 mmol/L — ABNORMAL HIGH (ref 0.0–2.0)
Bicarbonate: 25.5 mEq/L — ABNORMAL HIGH (ref 20.0–24.0)
Bicarbonate: 24.8 mEq/L — ABNORMAL HIGH (ref 20.0–24.0)
Bicarbonate: 23.9 mEq/L (ref 20.0–24.0)
Bicarbonate: 22.1 mEq/L (ref 20.0–24.0)
Bicarbonate: 21 mEq/L (ref 20.0–24.0)
Bicarbonate: 23.9 mEq/L (ref 20.0–24.0)
Bicarbonate: 23.3 mEq/L (ref 20.0–24.0)
O2 Saturation: 100 %
O2 Saturation: 100 %
O2 Saturation: 89 %
O2 Saturation: 95 %
O2 Saturation: 87 %
O2 Saturation: 91 %
O2 Saturation: 91 %
Patient temperature: 36.2
Patient temperature: 36
Patient temperature: 35.9
Patient temperature: 35.8
Patient temperature: 35.9
TCO2: 27 mmol/L (ref 0–100)
TCO2: 26 mmol/L (ref 0–100)
TCO2: 25 mmol/L (ref 0–100)
TCO2: 23 mmol/L (ref 0–100)
TCO2: 22 mmol/L (ref 0–100)
TCO2: 25 mmol/L (ref 0–100)
TCO2: 25 mmol/L (ref 0–100)
pCO2 arterial: 45.5 mmHg — ABNORMAL HIGH (ref 35.0–45.0)
pCO2 arterial: 46.4 mmHg — ABNORMAL HIGH (ref 35.0–45.0)
pCO2 arterial: 47.8 mmHg — ABNORMAL HIGH (ref 35.0–45.0)
pCO2 arterial: 41.7 mmHg (ref 35.0–45.0)
pCO2 arterial: 38.8 mmHg (ref 35.0–45.0)
pCO2 arterial: 39.8 mmHg (ref 35.0–45.0)
pCO2 arterial: 42.1 mmHg (ref 35.0–45.0)
pH, Arterial: 7.357 (ref 7.350–7.450)
pH, Arterial: 7.337 — ABNORMAL LOW (ref 7.350–7.450)
pH, Arterial: 7.302 — ABNORMAL LOW (ref 7.350–7.450)
pH, Arterial: 7.328 — ABNORMAL LOW (ref 7.350–7.450)
pH, Arterial: 7.337 — ABNORMAL LOW (ref 7.350–7.450)
pH, Arterial: 7.381 (ref 7.350–7.450)
pH, Arterial: 7.346 — ABNORMAL LOW (ref 7.350–7.450)
pO2, Arterial: 270 mmHg — ABNORMAL HIGH (ref 80.0–100.0)
pO2, Arterial: 225 mmHg — ABNORMAL HIGH (ref 80.0–100.0)
pO2, Arterial: 60 mmHg — ABNORMAL LOW (ref 80.0–100.0)
pO2, Arterial: 80 mmHg (ref 80.0–100.0)
pO2, Arterial: 54 mmHg — ABNORMAL LOW (ref 80.0–100.0)
pO2, Arterial: 57 mmHg — ABNORMAL LOW (ref 80.0–100.0)
pO2, Arterial: 60 mmHg — ABNORMAL LOW (ref 80.0–100.0)

## 2015-04-04 LAB — CREATININE, SERUM
Creatinine, Ser: 0.97 mg/dL (ref 0.61–1.24)
GFR calc Af Amer: >60 mL/min (ref >60)
GFR calc non Af Amer: >60 mL/min (ref >60)

## 2015-04-04 LAB — POCT I-STAT 4, (NA,K, GLUC, HGB,HCT)
Glucose, Bld: 163 mg/dL — ABNORMAL HIGH (ref 65–99)
HCT: 40 % (ref 39.0–52.0)
Hemoglobin: 13.6 g/dL (ref 13.0–17.0)
Potassium: 3.5 mmol/L (ref 3.5–5.1)
Sodium: 140 mmol/L (ref 135–145)

## 2015-04-04 LAB — HEMOGLOBIN A1C
Hgb A1c MFr Bld: 6.1 % — ABNORMAL HIGH (ref 4.8–5.6)
Hgb A1c MFr Bld: 6.2 % — ABNORMAL HIGH (ref 4.8–5.6)
Mean Plasma Glucose: 128 mg/dL
Mean Plasma Glucose: 131 mg/dL

## 2015-04-04 LAB — PROTIME-INR
INR: 1.52 — ABNORMAL HIGH (ref 0.00–1.49)
Prothrombin Time: 18.4 seconds — ABNORMAL HIGH (ref 11.6–15.2)

## 2015-04-04 LAB — GLUCOSE, CAPILLARY
Glucose-Capillary: 107 mg/dL — ABNORMAL HIGH (ref 65–99)
Glucose-Capillary: 135 mg/dL — ABNORMAL HIGH (ref 65–99)
Glucose-Capillary: 98 mg/dL (ref 65–99)
Glucose-Capillary: 97 mg/dL (ref 65–99)
Glucose-Capillary: 139 mg/dL — ABNORMAL HIGH (ref 65–99)
Glucose-Capillary: 139 mg/dL — ABNORMAL HIGH (ref 65–99)
Glucose-Capillary: 169 mg/dL — ABNORMAL HIGH (ref 65–99)
Glucose-Capillary: 165 mg/dL — ABNORMAL HIGH (ref 65–99)

## 2015-04-04 LAB — APTT: aPTT: 33 seconds (ref 24–37)

## 2015-04-04 LAB — ABO/RH: ABO/RH(D): B POS

## 2015-04-04 LAB — HEMOGLOBIN AND HEMATOCRIT, BLOOD
HCT: 33 % — ABNORMAL LOW (ref 39.0–52.0)
Hemoglobin: 11.4 g/dL — ABNORMAL LOW (ref 13.0–17.0)

## 2015-04-04 LAB — MAGNESIUM: Magnesium: 2.9 mg/dL — ABNORMAL HIGH (ref 1.7–2.4)

## 2015-04-04 LAB — PLATELET COUNT: Platelets: 149 10*3/uL — ABNORMAL LOW (ref 150–400)

## 2015-04-04 LAB — HEPARIN LEVEL (UNFRACTIONATED): Heparin Unfractionated: 0.61 IU/mL (ref 0.30–0.70)

## 2015-04-04 SURGERY — CORONARY ARTERY BYPASS GRAFTING (CABG)
Anesthesia: General | Site: Chest

## 2015-04-04 MED ORDER — POTASSIUM CHLORIDE 10 MEQ/50ML IV SOLN
10.0000 meq | INTRAVENOUS | Status: AC
  Administered 2015-04-04 (×3): 10 meq via INTRAVENOUS

## 2015-04-04 MED ORDER — PROPOFOL 10 MG/ML IV BOLUS
INTRAVENOUS | Status: DC | PRN
  Administered 2015-04-04: 50 mg via INTRAVENOUS
  Administered 2015-04-04: 100 mg via INTRAVENOUS

## 2015-04-04 MED ORDER — LACTATED RINGERS IV SOLN
INTRAVENOUS | Status: DC | PRN
  Administered 2015-04-04 (×2): via INTRAVENOUS

## 2015-04-04 MED ORDER — ROCURONIUM BROMIDE 100 MG/10ML IV SOLN
INTRAVENOUS | Status: DC | PRN
  Administered 2015-04-04: 100 mg via INTRAVENOUS
  Administered 2015-04-04: 50 mg via INTRAVENOUS

## 2015-04-04 MED ORDER — LACTATED RINGERS IV SOLN
INTRAVENOUS | Status: DC | PRN
  Administered 2015-04-04: 07:00:00 via INTRAVENOUS

## 2015-04-04 MED ORDER — SODIUM CHLORIDE 0.9 % IV SOLN
INTRAVENOUS | Status: DC
  Administered 2015-04-04: 15:00:00 via INTRAVENOUS

## 2015-04-04 MED ORDER — PROTAMINE SULFATE 10 MG/ML IV SOLN
INTRAVENOUS | Status: AC
  Filled 2015-04-04: qty 25

## 2015-04-04 MED ORDER — VANCOMYCIN HCL IN DEXTROSE 1-5 GM/200ML-% IV SOLN
1000.0000 mg | Freq: Once | INTRAVENOUS | Status: AC
  Administered 2015-04-04: 1000 mg via INTRAVENOUS
  Filled 2015-04-04: qty 200

## 2015-04-04 MED ORDER — SODIUM CHLORIDE 0.9 % IJ SOLN
3.0000 mL | Freq: Two times a day (BID) | INTRAMUSCULAR | Status: DC
  Administered 2015-04-05 – 2015-04-06 (×4): 3 mL via INTRAVENOUS

## 2015-04-04 MED ORDER — PHENYLEPHRINE HCL 10 MG/ML IJ SOLN
0.0000 ug/min | INTRAMUSCULAR | Status: DC
  Filled 2015-04-04: qty 2

## 2015-04-04 MED ORDER — PHENYLEPHRINE HCL 10 MG/ML IJ SOLN
INTRAMUSCULAR | Status: DC | PRN
  Administered 2015-04-04: 80 ug via INTRAVENOUS

## 2015-04-04 MED ORDER — MIDAZOLAM HCL 5 MG/5ML IJ SOLN
INTRAMUSCULAR | Status: DC | PRN
  Administered 2015-04-04: 4 mg via INTRAVENOUS
  Administered 2015-04-04: 3 mg via INTRAVENOUS

## 2015-04-04 MED ORDER — PLASMA-LYTE 148 IV SOLN
INTRAVENOUS | Status: DC
  Filled 2015-04-04: qty 2.5

## 2015-04-04 MED ORDER — ANTISEPTIC ORAL RINSE SOLUTION (CORINZ)
7.0000 mL | Freq: Four times a day (QID) | OROMUCOSAL | Status: DC
  Administered 2015-04-05 (×4): 7 mL via OROMUCOSAL

## 2015-04-04 MED ORDER — ROCURONIUM BROMIDE 50 MG/5ML IV SOLN
INTRAVENOUS | Status: AC
  Filled 2015-04-04: qty 2

## 2015-04-04 MED ORDER — ACETAMINOPHEN 160 MG/5ML PO SOLN: 1000.0000 mg | Freq: Four times a day (QID) | ORAL | Status: AC

## 2015-04-04 MED ORDER — DEXAMETHASONE SODIUM PHOSPHATE 4 MG/ML IJ SOLN
INTRAMUSCULAR | Status: DC | PRN
  Administered 2015-04-04: 8 mg via INTRAVENOUS

## 2015-04-04 MED ORDER — SODIUM CHLORIDE 0.9 % IJ SOLN
INTRAMUSCULAR | Status: AC
  Filled 2015-04-04: qty 10

## 2015-04-04 MED ORDER — MIDAZOLAM HCL 2 MG/2ML IJ SOLN: 2.0000 mg | INTRAMUSCULAR | Status: DC | PRN

## 2015-04-04 MED ORDER — OXYCODONE HCL 5 MG PO TABS
5.0000 mg | ORAL_TABLET | ORAL | Status: DC | PRN
  Administered 2015-04-05 – 2015-04-08 (×7): 10 mg via ORAL
  Filled 2015-04-04 (×7): qty 2

## 2015-04-04 MED ORDER — METOPROLOL TARTRATE 12.5 MG HALF TABLET
12.5000 mg | ORAL_TABLET | Freq: Two times a day (BID) | ORAL | Status: DC
  Administered 2015-04-05: 12.5 mg via ORAL
  Filled 2015-04-04 (×5): qty 1

## 2015-04-04 MED ORDER — SCOPOLAMINE 1 MG/3DAYS TD PT72
MEDICATED_PATCH | TRANSDERMAL | Status: AC
  Filled 2015-04-04: qty 1

## 2015-04-04 MED ORDER — ALBUMIN HUMAN 5 % IV SOLN
INTRAVENOUS | Status: DC | PRN
  Administered 2015-04-04 (×2): via INTRAVENOUS

## 2015-04-04 MED ORDER — LACTATED RINGERS IV SOLN
INTRAVENOUS | Status: DC
  Administered 2015-04-07: 04:00:00 via INTRAVENOUS

## 2015-04-04 MED ORDER — MORPHINE SULFATE (PF) 2 MG/ML IV SOLN
1.0000 mg | INTRAVENOUS | Status: AC | PRN
  Administered 2015-04-04: 2 mg via INTRAVENOUS
  Filled 2015-04-04: qty 2

## 2015-04-04 MED ORDER — MORPHINE SULFATE (PF) 2 MG/ML IV SOLN
2.0000 mg | INTRAVENOUS | Status: DC | PRN
  Administered 2015-04-06: 4 mg via INTRAVENOUS
  Administered 2015-04-06 (×3): 2 mg via INTRAVENOUS
  Filled 2015-04-04 (×2): qty 1
  Filled 2015-04-04: qty 2
  Filled 2015-04-04: qty 1

## 2015-04-04 MED ORDER — ONDANSETRON HCL 4 MG/2ML IJ SOLN
4.0000 mg | Freq: Four times a day (QID) | INTRAMUSCULAR | Status: DC | PRN
  Administered 2015-04-05 – 2015-04-06 (×4): 4 mg via INTRAVENOUS
  Filled 2015-04-04 (×4): qty 2

## 2015-04-04 MED ORDER — FENTANYL CITRATE (PF) 250 MCG/5ML IJ SOLN
INTRAMUSCULAR | Status: AC
  Filled 2015-04-04: qty 5

## 2015-04-04 MED ORDER — VECURONIUM BROMIDE 10 MG IV SOLR
INTRAVENOUS | Status: DC | PRN
  Administered 2015-04-04 (×2): 3 mg via INTRAVENOUS
  Administered 2015-04-04 (×2): 2 mg via INTRAVENOUS

## 2015-04-04 MED ORDER — HEPARIN SODIUM (PORCINE) 1000 UNIT/ML IJ SOLN
INTRAMUSCULAR | Status: AC
  Filled 2015-04-04: qty 1

## 2015-04-04 MED ORDER — ACETAMINOPHEN 160 MG/5ML PO SOLN: 650.0000 mg | Freq: Once | ORAL | Status: AC

## 2015-04-04 MED ORDER — DEXTROSE 5 % IV SOLN
1.5000 g | Freq: Two times a day (BID) | INTRAVENOUS | Status: AC
  Administered 2015-04-04 – 2015-04-06 (×4): 1.5 g via INTRAVENOUS
  Filled 2015-04-04 (×4): qty 1.5

## 2015-04-04 MED ORDER — 0.9 % SODIUM CHLORIDE (POUR BTL) OPTIME
TOPICAL | Status: DC | PRN
  Administered 2015-04-04: 6000 mL

## 2015-04-04 MED ORDER — LACTATED RINGERS IV SOLN
INTRAVENOUS | Status: DC | PRN
  Administered 2015-04-04 (×2): via INTRAVENOUS

## 2015-04-04 MED ORDER — NITROGLYCERIN 0.2 MG/ML ON CALL CATH LAB
INTRAVENOUS | Status: DC | PRN
  Administered 2015-04-04: 40 ug via INTRAVENOUS

## 2015-04-04 MED ORDER — ACETAMINOPHEN 650 MG RE SUPP
650.0000 mg | Freq: Once | RECTAL | Status: AC
  Administered 2015-04-04: 650 mg via RECTAL

## 2015-04-04 MED ORDER — EPHEDRINE SULFATE 50 MG/ML IJ SOLN
INTRAMUSCULAR | Status: AC
  Filled 2015-04-04: qty 1

## 2015-04-04 MED ORDER — AMIODARONE HCL IN DEXTROSE 360-4.14 MG/200ML-% IV SOLN
INTRAVENOUS | Status: DC | PRN
  Administered 2015-04-04: 30 mg/h via INTRAVENOUS

## 2015-04-04 MED ORDER — SODIUM CHLORIDE 0.9 % IJ SOLN
3.0000 mL | INTRAMUSCULAR | Status: DC | PRN
  Administered 2015-04-09: 3 mL via INTRAVENOUS
  Filled 2015-04-04: qty 3

## 2015-04-04 MED ORDER — DEXAMETHASONE SODIUM PHOSPHATE 4 MG/ML IJ SOLN
INTRAMUSCULAR | Status: AC
  Filled 2015-04-04: qty 2

## 2015-04-04 MED ORDER — LACTATED RINGERS IV SOLN: INTRAVENOUS | Status: DC

## 2015-04-04 MED ORDER — HEMOSTATIC AGENTS (NO CHARGE) OPTIME
TOPICAL | Status: DC | PRN
  Administered 2015-04-04 (×5): 1 via TOPICAL

## 2015-04-04 MED ORDER — AMIODARONE HCL IN DEXTROSE 360-4.14 MG/200ML-% IV SOLN
30.0000 mg/h | INTRAVENOUS | Status: DC
  Filled 2015-04-04: qty 200

## 2015-04-04 MED ORDER — METOPROLOL TARTRATE 1 MG/ML IV SOLN
2.5000 mg | INTRAVENOUS | Status: DC | PRN
  Administered 2015-04-06: 2.5 mg via INTRAVENOUS
  Filled 2015-04-04: qty 5

## 2015-04-04 MED ORDER — MIDAZOLAM HCL 10 MG/2ML IJ SOLN
INTRAMUSCULAR | Status: AC
  Filled 2015-04-04: qty 4

## 2015-04-04 MED ORDER — DOPAMINE-DEXTROSE 3.2-5 MG/ML-% IV SOLN: 0.0000 ug/kg/min | INTRAVENOUS | Status: AC

## 2015-04-04 MED ORDER — TRAMADOL HCL 50 MG PO TABS: 50.0000 mg | ORAL_TABLET | ORAL | Status: DC | PRN

## 2015-04-04 MED ORDER — LACTATED RINGERS IV SOLN: 500.0000 mL | Freq: Once | INTRAVENOUS | Status: DC | PRN

## 2015-04-04 MED ORDER — AMIODARONE HCL IN DEXTROSE 360-4.14 MG/200ML-% IV SOLN
30.0000 mg/h | INTRAVENOUS | Status: DC
  Administered 2015-04-04 – 2015-04-07 (×6): 30 mg/h via INTRAVENOUS
  Filled 2015-04-04 (×11): qty 200

## 2015-04-04 MED ORDER — ASPIRIN EC 325 MG PO TBEC
325.0000 mg | DELAYED_RELEASE_TABLET | Freq: Every day | ORAL | Status: DC
  Administered 2015-04-05 – 2015-04-06 (×2): 325 mg via ORAL
  Filled 2015-04-04 (×3): qty 1

## 2015-04-04 MED ORDER — ASPIRIN 81 MG PO CHEW: 324.0000 mg | CHEWABLE_TABLET | Freq: Every day | ORAL | Status: DC

## 2015-04-04 MED ORDER — FENTANYL CITRATE (PF) 100 MCG/2ML IJ SOLN
INTRAMUSCULAR | Status: DC | PRN
  Administered 2015-04-04: 100 ug via INTRAVENOUS
  Administered 2015-04-04: 150 ug via INTRAVENOUS
  Administered 2015-04-04: 50 ug via INTRAVENOUS
  Administered 2015-04-04: 300 ug via INTRAVENOUS
  Administered 2015-04-04: 200 ug via INTRAVENOUS
  Administered 2015-04-04: 250 ug via INTRAVENOUS

## 2015-04-04 MED ORDER — METOPROLOL TARTRATE 25 MG/10 ML ORAL SUSPENSION
12.5000 mg | Freq: Two times a day (BID) | ORAL | Status: DC
  Filled 2015-04-04 (×5): qty 5

## 2015-04-04 MED ORDER — FENTANYL CITRATE (PF) 250 MCG/5ML IJ SOLN
INTRAMUSCULAR | Status: AC
Start: 2015-04-04 — End: 2015-04-04
  Filled 2015-04-04: qty 5

## 2015-04-04 MED ORDER — CHLORHEXIDINE GLUCONATE 0.12% ORAL RINSE (MEDLINE KIT)
15.0000 mL | Freq: Two times a day (BID) | OROMUCOSAL | Status: DC
  Administered 2015-04-04 – 2015-04-05 (×2): 15 mL via OROMUCOSAL

## 2015-04-04 MED ORDER — PANTOPRAZOLE SODIUM 40 MG PO TBEC
40.0000 mg | DELAYED_RELEASE_TABLET | Freq: Every day | ORAL | Status: DC
  Administered 2015-04-06: 40 mg via ORAL
  Filled 2015-04-04: qty 1

## 2015-04-04 MED ORDER — PROTAMINE SULFATE 10 MG/ML IV SOLN
INTRAVENOUS | Status: DC | PRN
  Administered 2015-04-04: 18 mg via INTRAVENOUS

## 2015-04-04 MED ORDER — BISACODYL 5 MG PO TBEC
10.0000 mg | DELAYED_RELEASE_TABLET | Freq: Every day | ORAL | Status: DC
  Administered 2015-04-05 – 2015-04-09 (×3): 10 mg via ORAL
  Filled 2015-04-04 (×5): qty 2

## 2015-04-04 MED ORDER — BISACODYL 10 MG RE SUPP: 10.0000 mg | Freq: Every day | RECTAL | Status: DC

## 2015-04-04 MED ORDER — ALBUMIN HUMAN 5 % IV SOLN
250.0000 mL | INTRAVENOUS | Status: AC | PRN
  Administered 2015-04-04 (×2): 250 mL via INTRAVENOUS

## 2015-04-04 MED ORDER — HEPARIN SODIUM (PORCINE) 1000 UNIT/ML IJ SOLN
INTRAMUSCULAR | Status: DC | PRN
  Administered 2015-04-04: 29000 [IU] via INTRAVENOUS

## 2015-04-04 MED ORDER — SODIUM CHLORIDE 0.45 % IV SOLN
INTRAVENOUS | Status: DC | PRN
  Administered 2015-04-04: 15:00:00 via INTRAVENOUS

## 2015-04-04 MED ORDER — SODIUM CHLORIDE 0.9 % IV SOLN
INTRAVENOUS | Status: AC
  Administered 2015-04-04: 2.4 [IU]/h via INTRAVENOUS
  Filled 2015-04-04 (×2): qty 2.5

## 2015-04-04 MED ORDER — FAMOTIDINE IN NACL 20-0.9 MG/50ML-% IV SOLN
20.0000 mg | Freq: Two times a day (BID) | INTRAVENOUS | Status: AC
  Administered 2015-04-04 (×2): 20 mg via INTRAVENOUS
  Filled 2015-04-04: qty 50

## 2015-04-04 MED ORDER — SODIUM CHLORIDE 0.9 % IJ SOLN
INTRAMUSCULAR | Status: AC
  Filled 2015-04-04: qty 20

## 2015-04-04 MED ORDER — DOCUSATE SODIUM 100 MG PO CAPS
200.0000 mg | ORAL_CAPSULE | Freq: Every day | ORAL | Status: DC
  Administered 2015-04-05 – 2015-04-11 (×5): 200 mg via ORAL
  Filled 2015-04-04 (×5): qty 2

## 2015-04-04 MED ORDER — INSULIN REGULAR BOLUS VIA INFUSION
0.0000 [IU] | Freq: Three times a day (TID) | INTRAVENOUS | Status: DC
  Filled 2015-04-04: qty 10

## 2015-04-04 MED ORDER — ACETAMINOPHEN 500 MG PO TABS
1000.0000 mg | ORAL_TABLET | Freq: Four times a day (QID) | ORAL | Status: AC
  Administered 2015-04-05 – 2015-04-09 (×16): 1000 mg via ORAL
  Filled 2015-04-04 (×21): qty 2

## 2015-04-04 MED ORDER — NITROGLYCERIN IN D5W 200-5 MCG/ML-% IV SOLN: 0.0000 ug/min | INTRAVENOUS | Status: DC

## 2015-04-04 MED ORDER — PROPOFOL 10 MG/ML IV BOLUS
INTRAVENOUS | Status: AC
  Filled 2015-04-04: qty 20

## 2015-04-04 MED ORDER — SODIUM CHLORIDE 0.9 % IV SOLN: 250.0000 mL | INTRAVENOUS | Status: DC

## 2015-04-04 MED ORDER — AMINOCAPROIC ACID 250 MG/ML IV SOLN
INTRAVENOUS | Status: DC | PRN
  Administered 2015-04-04: 5 g via INTRAVENOUS

## 2015-04-04 MED ORDER — METOCLOPRAMIDE HCL 5 MG/ML IJ SOLN
10.0000 mg | Freq: Four times a day (QID) | INTRAMUSCULAR | Status: AC
  Administered 2015-04-05 (×3): 10 mg via INTRAVENOUS
  Filled 2015-04-04 (×3): qty 2

## 2015-04-04 MED ORDER — MAGNESIUM SULFATE 4 GM/100ML IV SOLN
4.0000 g | Freq: Once | INTRAVENOUS | Status: AC
  Administered 2015-04-04: 4 g via INTRAVENOUS
  Filled 2015-04-04: qty 100

## 2015-04-04 MED ORDER — DEXMEDETOMIDINE HCL IN NACL 200 MCG/50ML IV SOLN
0.0000 ug/kg/h | INTRAVENOUS | Status: DC
  Administered 2015-04-04: 0.6 ug/kg/h via INTRAVENOUS
  Filled 2015-04-04: qty 50

## 2015-04-04 MED FILL — Potassium Chloride Inj 2 mEq/ML: INTRAVENOUS | Qty: 40 | Status: AC

## 2015-04-04 MED FILL — Magnesium Sulfate Inj 50%: INTRAMUSCULAR | Qty: 2 | Status: AC

## 2015-04-04 MED FILL — Heparin Sodium (Porcine) Inj 1000 Unit/ML: INTRAMUSCULAR | Qty: 30 | Status: AC

## 2015-04-04 SURGICAL SUPPLY — 85 items
BAG DECANTER FOR FLEXI CONT (MISCELLANEOUS) ×3 IMPLANT
BANDAGE ELASTIC 4 VELCRO ST LF (GAUZE/BANDAGES/DRESSINGS) ×4 IMPLANT
BANDAGE ELASTIC 6 VELCRO ST LF (GAUZE/BANDAGES/DRESSINGS) ×4 IMPLANT
BLADE STERNUM SYSTEM 6 (BLADE) ×3 IMPLANT
BLADE SURG 11 STRL SS (BLADE) ×1 IMPLANT
BNDG GAUZE ELAST 4 BULKY (GAUZE/BANDAGES/DRESSINGS) ×4 IMPLANT
CANISTER SUCTION 2500CC (MISCELLANEOUS) ×3 IMPLANT
CANNULA VESSEL 3MM 2 BLNT TIP (CANNULA) ×4 IMPLANT
CATH CPB KIT GERHARDT (MISCELLANEOUS) ×3 IMPLANT
CATH THORACIC 28FR (CATHETERS) ×3 IMPLANT
CLIP FOGARTY SPRING 6M (CLIP) ×4 IMPLANT
CRADLE DONUT ADULT HEAD (MISCELLANEOUS) ×3 IMPLANT
DRAIN CHANNEL 28F RND 3/8 FF (WOUND CARE) ×3 IMPLANT
DRAPE CARDIOVASCULAR INCISE (DRAPES) ×3
DRAPE SLUSH/WARMER DISC (DRAPES) ×3 IMPLANT
DRAPE SRG 135X102X78XABS (DRAPES) ×2 IMPLANT
DRSG AQUACEL AG ADV 3.5X14 (GAUZE/BANDAGES/DRESSINGS) ×3 IMPLANT
ELECT BLADE 4.0 EZ CLEAN MEGAD (MISCELLANEOUS) ×3
ELECT REM PT RETURN 9FT ADLT (ELECTROSURGICAL) ×6
ELECTRODE BLDE 4.0 EZ CLN MEGD (MISCELLANEOUS) ×2 IMPLANT
ELECTRODE REM PT RTRN 9FT ADLT (ELECTROSURGICAL) ×4 IMPLANT
GAUZE SPONGE 4X4 12PLY STRL (GAUZE/BANDAGES/DRESSINGS) ×6 IMPLANT
GLOVE BIO SURGEON STRL SZ 6.5 (GLOVE) ×14 IMPLANT
GLOVE BIOGEL PI IND STRL 6 (GLOVE) IMPLANT
GLOVE BIOGEL PI IND STRL 6.5 (GLOVE) IMPLANT
GLOVE BIOGEL PI INDICATOR 6 (GLOVE) ×2
GLOVE BIOGEL PI INDICATOR 6.5 (GLOVE) ×2
GOWN STRL REUS W/ TWL LRG LVL3 (GOWN DISPOSABLE) ×8 IMPLANT
GOWN STRL REUS W/TWL LRG LVL3 (GOWN DISPOSABLE) ×30
HEMOSTAT POWDER SURGIFOAM 1G (HEMOSTASIS) ×9 IMPLANT
HEMOSTAT SURGICEL 2X14 (HEMOSTASIS) ×3 IMPLANT
KIT BASIN OR (CUSTOM PROCEDURE TRAY) ×3 IMPLANT
KIT CATH SUCT 8FR (CATHETERS) ×3 IMPLANT
KIT ROOM TURNOVER OR (KITS) ×3 IMPLANT
KIT SUCTION CATH 14FR (SUCTIONS) ×6 IMPLANT
KIT VASOVIEW W/TROCAR VH 2000 (KITS) ×3 IMPLANT
LEAD PACING MYOCARDI (MISCELLANEOUS) ×3 IMPLANT
MARKER GRAFT CORONARY BYPASS (MISCELLANEOUS) ×9 IMPLANT
NS IRRIG 1000ML POUR BTL (IV SOLUTION) ×15 IMPLANT
PACK OPEN HEART (CUSTOM PROCEDURE TRAY) ×3 IMPLANT
PAD ARMBOARD 7.5X6 YLW CONV (MISCELLANEOUS) ×6 IMPLANT
PAD ELECT DEFIB RADIOL ZOLL (MISCELLANEOUS) ×3 IMPLANT
PENCIL BUTTON HOLSTER BLD 10FT (ELECTRODE) ×4 IMPLANT
PUNCH AORTIC ROT 4.0MM RCL 40 (MISCELLANEOUS) ×1 IMPLANT
SET CARDIOPLEGIA MPS 5001102 (MISCELLANEOUS) ×1 IMPLANT
SOLUTION ANTI FOG 6CC (MISCELLANEOUS) ×1 IMPLANT
SPONGE GAUZE 4X4 12PLY STER LF (GAUZE/BANDAGES/DRESSINGS) ×3 IMPLANT
SPONGE LAP 18X18 X RAY DECT (DISPOSABLE) ×5 IMPLANT
SURGIFLO W/THROMBIN 8M KIT (HEMOSTASIS) ×1 IMPLANT
SUT BONE WAX W31G (SUTURE) ×3 IMPLANT
SUT ETHIBOND 2 0 SH (SUTURE) ×12
SUT ETHIBOND 2 0 SH 36X2 (SUTURE) IMPLANT
SUT MNCRL AB 4-0 PS2 18 (SUTURE) ×2 IMPLANT
SUT PROLENE 3 0 SH1 36 (SUTURE) ×4 IMPLANT
SUT PROLENE 4 0 RB 1 (SUTURE) ×3
SUT PROLENE 4 0 TF (SUTURE) ×7 IMPLANT
SUT PROLENE 4-0 RB1 .5 CRCL 36 (SUTURE) IMPLANT
SUT PROLENE 5 0 C 1 36 (SUTURE) ×2 IMPLANT
SUT PROLENE 6 0 C 1 30 (SUTURE) ×2 IMPLANT
SUT PROLENE 6 0 CC (SUTURE) ×8 IMPLANT
SUT PROLENE 7 0 BV1 MDA (SUTURE) ×4 IMPLANT
SUT PROLENE 8 0 BV175 6 (SUTURE) ×16 IMPLANT
SUT SILK  1 MH (SUTURE) ×3
SUT SILK 1 MH (SUTURE) IMPLANT
SUT SILK 2 0 SH CR/8 (SUTURE) ×2 IMPLANT
SUT SILK 3 0 SH CR/8 (SUTURE) ×1 IMPLANT
SUT SILK 4 0 TIE 10X30 (SUTURE) ×2 IMPLANT
SUT STEEL 6MS V (SUTURE) ×3 IMPLANT
SUT STEEL SZ 6 DBL 3X14 BALL (SUTURE) ×3 IMPLANT
SUT TEM PAC WIRE 2 0 SH (SUTURE) ×4 IMPLANT
SUT VIC AB 1 CTX 18 (SUTURE) ×8 IMPLANT
SUT VIC AB 2-0 CT1 27 (SUTURE) ×6
SUT VIC AB 2-0 CT1 TAPERPNT 27 (SUTURE) IMPLANT
SUT VIC AB 2-0 CTX 27 (SUTURE) ×2 IMPLANT
SUT VIC AB 3-0 X1 27 (SUTURE) ×2 IMPLANT
SYSTEM SAHARA CHEST DRAIN ATS (WOUND CARE) ×3 IMPLANT
TAPE CLOTH SURG 6X10 WHT LF (GAUZE/BANDAGES/DRESSINGS) ×1 IMPLANT
TAPE PAPER 3X10 WHT MICROPORE (GAUZE/BANDAGES/DRESSINGS) ×1 IMPLANT
TAPE UMBILICAL COTTON 1/8X30 (MISCELLANEOUS) ×1 IMPLANT
TOWEL OR 17X24 6PK STRL BLUE (TOWEL DISPOSABLE) ×6 IMPLANT
TOWEL OR 17X26 10 PK STRL BLUE (TOWEL DISPOSABLE) ×6 IMPLANT
TRAY FOLEY IC TEMP SENS 16FR (CATHETERS) ×3 IMPLANT
TUBING INSUFFLATION (TUBING) ×3 IMPLANT
UNDERPAD 30X30 INCONTINENT (UNDERPADS AND DIAPERS) ×3 IMPLANT
WATER STERILE IRR 1000ML POUR (IV SOLUTION) ×6 IMPLANT

## 2015-04-04 NOTE — OR Nursing (Signed)
Nursing staff provided family with patient update.

## 2015-04-04 NOTE — Progress Notes (Signed)
Echocardiogram 2D Echocardiogram has been performed.  [name] 04/04/2015, 9:05 AM

## 2015-04-04 NOTE — Anesthesia Postprocedure Evaluation (Signed)
  Anesthesia Post-op Note  Patient: [name]  Procedure(s) Performed: Procedure(s): CORONARY ARTERY BYPASS GRAFTING (CABG) (N/A) TRANSESOPHAGEAL ECHOCARDIOGRAM (TEE) (N/A)  Patient Location: SICU  Anesthesia Type:General  Level of Consciousness: sedated and Patient remains intubated per anesthesia plan  Airway and Oxygen Therapy: Patient remains intubated per anesthesia plan and Patient placed on Ventilator (see vital sign flow sheet for setting)  Post-op Pain: none  Post-op Assessment: Post-op Vital signs reviewed, Patient's Cardiovascular Status Stable, Respiratory Function Stable, Patent Airway, No signs of Nausea or vomiting and Pain level controlled              Post-op Vital Signs: Reviewed and stable  Last Vitals:  Filed Vitals:   04/04/15 0727  BP:   Pulse:   Temp:   Resp: 13    Complications: No apparent anesthesia complications

## 2015-04-04 NOTE — Progress Notes (Signed)
TCTS BRIEF SICU PROGRESS NOTE  Day of Surgery  S/P Procedure(s) (LRB): CORONARY ARTERY BYPASS GRAFTING (CABG) (N/A) TRANSESOPHAGEAL ECHOCARDIOGRAM (TEE) (N/A)   Sedated on vent NSR w/ stable hemodynamics on low dose dopamine O2 sats 95% on 60% FiO2 Chest tube output low UOP > 125 mL/hr  Plan: Continue routine early postop  Purcell Nails 04/04/2015 6:13 PM

## 2015-04-04 NOTE — Anesthesia Procedure Notes (Signed)
Procedure Name: Intubation Date/Time: 04/04/2015 7:56 AM Performed by: Jeani Hawking Pre-anesthesia Checklist: Patient identified, Emergency Drugs available, Suction available, Patient being monitored and Timeout performed Patient Re-evaluated:Patient Re-evaluated prior to inductionOxygen Delivery Method: Circle system utilized Preoxygenation: Pre-oxygenation with 100% oxygen Intubation Type: IV induction Ventilation: Oral airway inserted - appropriate to patient size Laryngoscope Size: Miller, 2, Mac and 4 (successful intubation with Mac 4 and bougie by CRNA) Grade View: Grade III Tube type: Oral Tube size: 8.0 mm Number of attempts: 2 Airway Equipment and Method: Bougie stylet Placement Confirmation: positive ETCO2 and breath sounds checked- equal and bilateral Secured at: 23 cm Tube secured with: Tape Dental Injury: Teeth and Oropharynx as per pre-operative assessment  Difficulty Due To: Difficulty was anticipated Comments: Esophageal intubation with first attempt by SRNA with Miller 2. Successful intubation with Mac 4 and bougie by CRNA, grade 3 view

## 2015-04-04 NOTE — Progress Notes (Signed)
VC .75 NIF - 22 good cuff leak. Pt extubated to a 6 lt Byron. IS X 10 500 with good effort.

## 2015-04-04 NOTE — Brief Op Note (Signed)
03/31/2015 - 04/04/2015  12:33 PM  PATIENT:  [name]  69 y.o. male  PRE-OPERATIVE DIAGNOSIS:  CAD  POST-OPERATIVE DIAGNOSIS:  CAD  PROCEDURE:  Procedure(s):  CORONARY ARTERY BYPASS GRAFTING  X 5 -LIMA to LAD -SVG to DIAGONAL -SVG to OM -SEQ SVG to PDA, DISTAL L CIRCUMFLEX  ENDOSCOPIC HARVEST GREATER SAPHENOUS VEIN -Right leg -Left Thigh  TRANSESOPHAGEAL ECHOCARDIOGRAM (TEE) (N/A)  SURGEON:  Surgeon(s) and Role:    * [name], MD - Primary  PHYSICIAN ASSISTANT: Michaeal Davis PA-C  ANESTHESIA:   general  EBL:  Total I/O In: 2000 [I.V.:2000] Out: 925 [Urine:925]  BLOOD ADMINISTERED: CELLSAVER  DRAINS: Left and Right Pleural Chest Tube, Mediastinal Chest drains   LOCAL MEDICATIONS USED:  NONE  SPECIMEN:  No Specimen  DISPOSITION OF SPECIMEN:  N/A  COUNTS:  YES  TOURNIQUET:  * No tourniquets in log *  DICTATION: .Dragon Dictation  PLAN OF CARE: Admit to inpatient   PATIENT DISPOSITION:  ICU - intubated and hemodynamically stable.   Delay start of Pharmacological VTE agent (>24hrs) due to surgical blood loss or risk of bleeding: yes

## 2015-04-04 NOTE — OR Nursing (Signed)
RN contacted SICU (spoke with Corrie Dandy), providing patient update.

## 2015-04-04 NOTE — Transfer of Care (Signed)
Immediate Anesthesia Transfer of Care Note  Patient: [name]  Procedure(s) Performed: Procedure(s): CORONARY ARTERY BYPASS GRAFTING (CABG) (N/A) TRANSESOPHAGEAL ECHOCARDIOGRAM (TEE) (N/A)  Patient Location: SICU  Anesthesia Type:General  Level of Consciousness: sedated, unresponsive and Patient remains intubated per anesthesia plan  Airway & Oxygen Therapy: Patient remains intubated per anesthesia plan and Patient placed on Ventilator (see vital sign flow sheet for setting)  Post-op Assessment: Report given to RN and Post -op Vital signs reviewed and stable  Post vital signs: Reviewed and stable  Last Vitals:  Filed Vitals:   04/04/15 0727  BP:   Pulse:   Temp:   Resp: 13    Complications: No apparent anesthesia complications

## 2015-04-04 NOTE — OR Nursing (Signed)
RN contacted SICU Corrie Dandy), confirming transfer to 2S Room 8 postoperatively.

## 2015-04-04 NOTE — OR Nursing (Signed)
RN contacted SICU (2S), spoke with Corrie Dandy, to provide a patient update.

## 2015-04-04 NOTE — Procedures (Signed)
Extubation Procedure Note  Patient Details:   Name: [name] DOB: [birth year] MRN: [id number]   Airway Documentation:  Airway 8 mm (Active)  Secured at (cm) 23 cm 04/04/2015  7:46 PM  Measured From Lips 04/04/2015  7:46 PM  Secured Location Right 04/04/2015  7:46 PM  Secured By Pink Tape 04/04/2015  7:46 PM  Site Condition Cool;Dry 04/04/2015  7:46 PM    Evaluation  O2 sats: stable throughout Complications: No apparent complications Patient did tolerate procedure well. Bilateral Breath Sounds: Rhonchi, Diminished Suctioning: Airway Yes  [name] [facility] 04/04/2015, 9:53 PM

## 2015-04-05 ENCOUNTER — Encounter (HOSPITAL_COMMUNITY): Payer: Self-pay | Admitting: Cardiothoracic Surgery

## 2015-04-05 ENCOUNTER — Inpatient Hospital Stay (HOSPITAL_COMMUNITY): Payer: Medicare Other

## 2015-04-05 DIAGNOSIS — I25118 Atherosclerotic heart disease of native coronary artery with other forms of angina pectoris: Secondary | ICD-10-CM | POA: Insufficient documentation

## 2015-04-05 LAB — BASIC METABOLIC PANEL
Anion gap: 5 (ref 5–15)
BUN: 17 mg/dL (ref 6–20)
CO2: 24 mmol/L (ref 22–32)
Calcium: 7.4 mg/dL — ABNORMAL LOW (ref 8.9–10.3)
Chloride: 106 mmol/L (ref 101–111)
Creatinine, Ser: 1 mg/dL (ref 0.61–1.24)
GFR calc Af Amer: >60 mL/min (ref >60)
GFR calc non Af Amer: >60 mL/min (ref >60)
Glucose, Bld: 109 mg/dL — ABNORMAL HIGH (ref 65–99)
Potassium: 3.9 mmol/L (ref 3.5–5.1)
Sodium: 135 mmol/L (ref 135–145)

## 2015-04-05 LAB — POCT I-STAT, CHEM 8
BUN: 24 mg/dL — ABNORMAL HIGH (ref 6–20)
Calcium, Ion: 1.08 mmol/L — ABNORMAL LOW (ref 1.13–1.30)
Chloride: 101 mmol/L (ref 101–111)
Creatinine, Ser: 1.2 mg/dL (ref 0.61–1.24)
Glucose, Bld: 182 mg/dL — ABNORMAL HIGH (ref 65–99)
HCT: 31 % — ABNORMAL LOW (ref 39.0–52.0)
Hemoglobin: 10.5 g/dL — ABNORMAL LOW (ref 13.0–17.0)
Potassium: 4 mmol/L (ref 3.5–5.1)
Sodium: 137 mmol/L (ref 135–145)
TCO2: 20 mmol/L (ref 0–100)

## 2015-04-05 LAB — GLUCOSE, CAPILLARY
Glucose-Capillary: 100 mg/dL — ABNORMAL HIGH (ref 65–99)
Glucose-Capillary: 100 mg/dL — ABNORMAL HIGH (ref 65–99)
Glucose-Capillary: 110 mg/dL — ABNORMAL HIGH (ref 65–99)
Glucose-Capillary: 111 mg/dL — ABNORMAL HIGH (ref 65–99)
Glucose-Capillary: 113 mg/dL — ABNORMAL HIGH (ref 65–99)
Glucose-Capillary: 117 mg/dL — ABNORMAL HIGH (ref 65–99)
Glucose-Capillary: 120 mg/dL — ABNORMAL HIGH (ref 65–99)
Glucose-Capillary: 120 mg/dL — ABNORMAL HIGH (ref 65–99)
Glucose-Capillary: 120 mg/dL — ABNORMAL HIGH (ref 65–99)
Glucose-Capillary: 123 mg/dL — ABNORMAL HIGH (ref 65–99)
Glucose-Capillary: 124 mg/dL — ABNORMAL HIGH (ref 65–99)
Glucose-Capillary: 126 mg/dL — ABNORMAL HIGH (ref 65–99)
Glucose-Capillary: 130 mg/dL — ABNORMAL HIGH (ref 65–99)
Glucose-Capillary: 135 mg/dL — ABNORMAL HIGH (ref 65–99)
Glucose-Capillary: 164 mg/dL — ABNORMAL HIGH (ref 65–99)
Glucose-Capillary: 208 mg/dL — ABNORMAL HIGH (ref 65–99)
Glucose-Capillary: 97 mg/dL (ref 65–99)
Glucose-Capillary: 99 mg/dL (ref 65–99)

## 2015-04-05 LAB — CBC
HCT: 31.9 % — ABNORMAL LOW (ref 39.0–52.0)
HCT: 32 % — ABNORMAL LOW (ref 39.0–52.0)
Hemoglobin: 11.2 g/dL — ABNORMAL LOW (ref 13.0–17.0)
Hemoglobin: 10.9 g/dL — ABNORMAL LOW (ref 13.0–17.0)
MCH: 32.8 pg (ref 26.0–34.0)
MCH: 32.4 pg (ref 26.0–34.0)
MCHC: 35.1 g/dL (ref 30.0–36.0)
MCHC: 34.1 g/dL (ref 30.0–36.0)
MCV: 93.5 fL (ref 78.0–100.0)
MCV: 95.2 fL (ref 78.0–100.0)
Platelets: 105 10*3/uL — ABNORMAL LOW (ref 150–400)
Platelets: 128 10*3/uL — ABNORMAL LOW (ref 150–400)
RBC: 3.41 MIL/uL — ABNORMAL LOW (ref 4.22–5.81)
RBC: 3.36 MIL/uL — ABNORMAL LOW (ref 4.22–5.81)
RDW: 12.5 % (ref 11.5–15.5)
RDW: 12.9 % (ref 11.5–15.5)
WBC: 15.3 10*3/uL — ABNORMAL HIGH (ref 4.0–10.5)
WBC: 24.2 10*3/uL — ABNORMAL HIGH (ref 4.0–10.5)

## 2015-04-05 LAB — CREATININE, SERUM
Creatinine, Ser: 1.3 mg/dL — ABNORMAL HIGH (ref 0.61–1.24)
GFR calc Af Amer: >60 mL/min (ref >60)
GFR calc non Af Amer: 54 mL/min — ABNORMAL LOW (ref >60)

## 2015-04-05 LAB — MAGNESIUM
Magnesium: 2.3 mg/dL (ref 1.7–2.4)
Magnesium: 2.1 mg/dL (ref 1.7–2.4)

## 2015-04-05 MED ORDER — INSULIN ASPART 100 UNIT/ML ~~LOC~~ SOLN
0.0000 [IU] | SUBCUTANEOUS | Status: DC
  Administered 2015-04-05: 8 [IU] via SUBCUTANEOUS
  Administered 2015-04-05 – 2015-04-06 (×2): 4 [IU] via SUBCUTANEOUS
  Administered 2015-04-06: 2 [IU] via SUBCUTANEOUS
  Administered 2015-04-06 (×2): 4 [IU] via SUBCUTANEOUS
  Administered 2015-04-06 – 2015-04-07 (×3): 2 [IU] via SUBCUTANEOUS

## 2015-04-05 MED ORDER — CETYLPYRIDINIUM CHLORIDE 0.05 % MT LIQD
7.0000 mL | Freq: Two times a day (BID) | OROMUCOSAL | Status: DC
  Administered 2015-04-05 – 2015-04-09 (×8): 7 mL via OROMUCOSAL

## 2015-04-05 MED ORDER — METOCLOPRAMIDE HCL 5 MG/ML IJ SOLN
10.0000 mg | Freq: Four times a day (QID) | INTRAMUSCULAR | Status: DC
  Administered 2015-04-05 – 2015-04-07 (×7): 10 mg via INTRAVENOUS
  Filled 2015-04-05 (×8): qty 2

## 2015-04-05 MED ORDER — INSULIN DETEMIR 100 UNIT/ML ~~LOC~~ SOLN
10.0000 [IU] | Freq: Once | SUBCUTANEOUS | Status: AC
  Administered 2015-04-05: 10 [IU] via SUBCUTANEOUS
  Filled 2015-04-05: qty 0.1

## 2015-04-05 MED ORDER — FUROSEMIDE 10 MG/ML IJ SOLN
20.0000 mg | Freq: Once | INTRAMUSCULAR | Status: AC
  Administered 2015-04-05: 20 mg via INTRAVENOUS
  Filled 2015-04-05: qty 2

## 2015-04-05 MED ORDER — ENOXAPARIN SODIUM 30 MG/0.3ML ~~LOC~~ SOLN
30.0000 mg | SUBCUTANEOUS | Status: DC
  Administered 2015-04-05: 30 mg via SUBCUTANEOUS
  Filled 2015-04-05 (×2): qty 0.3

## 2015-04-05 MED FILL — Heparin Sodium (Porcine) Inj 1000 Unit/ML: INTRAMUSCULAR | Qty: 10 | Status: AC

## 2015-04-05 MED FILL — Electrolyte-R (PH 7.4) Solution: INTRAVENOUS | Qty: 4000 | Status: AC

## 2015-04-05 MED FILL — Lidocaine HCl IV Inj 20 MG/ML: INTRAVENOUS | Qty: 5 | Status: AC

## 2015-04-05 MED FILL — Sodium Bicarbonate IV Soln 8.4%: INTRAVENOUS | Qty: 50 | Status: AC

## 2015-04-05 MED FILL — Sodium Chloride IV Soln 0.9%: INTRAVENOUS | Qty: 2000 | Status: AC

## 2015-04-05 MED FILL — Mannitol IV Soln 20%: INTRAVENOUS | Qty: 500 | Status: AC

## 2015-04-05 NOTE — Anesthesia Postprocedure Evaluation (Signed)
Anesthesia Post Note  Patient: [name]  Procedure(s) Performed: Procedure(s) (LRB): CORONARY ARTERY BYPASS GRAFTING (CABG) (N/A) TRANSESOPHAGEAL ECHOCARDIOGRAM (TEE) (N/A)  Anesthesia type: General  Patient location: SICU  Post pain: Pain level controlled  Post assessment: Post-op Vital signs reviewed  Last Vitals: BP 92/53 mmHg  Pulse 90  Temp(Src) 36.7 C (Oral)  Resp 11  Ht  (1.753 m)  Wt 214 lb 15.2 oz (97.5 kg)  BMI 31.73 kg/m2  SpO2 94%  Post vital signs: Reviewed  Level of consciousness: sedated/intubaed  Complications: No apparent anesthesia complications

## 2015-04-05 NOTE — Progress Notes (Signed)
SICU PM ROUNDS  Patient examined and record reviewed.Hemodynamics stable,labs satisfactory.Patient had stable day.Continue current care. Kathlee Nations Trigt III 04/05/2015

## 2015-04-05 NOTE — Care Management Important Message (Signed)
Important Message  Patient Details  Name: [name] MRN: [id number] Date of Birth: 1946-01-18   Medicare Important Message Given:  Yes-second notification given    Kyla Balzarine 04/05/2015, 11:27 AM

## 2015-04-05 NOTE — Progress Notes (Addendum)
TCTS DAILY ICU PROGRESS NOTE                   [address]            [name] [address]          [phone]   1 Day Post-Op Procedure(s) (LRB): CORONARY ARTERY BYPASS GRAFTING (CABG) (N/A) TRANSESOPHAGEAL ECHOCARDIOGRAM (TEE) (N/A)  Total Length of Stay:  LOS: 5 days   Subjective: Nauseated earlier when getting OOB, feels a little better at present. Very anxious, sore.   Objective: Vital signs in last 24 hours: Temp:  [95.9 F (35.5 C)-98.1 F (36.7 C)] 98.1 F (36.7 C) (09/20 0700) Pulse Rate:  [89-91] 90 (09/20 0700) Cardiac Rhythm:  [-] Atrial paced (09/20 0800) Resp:  [10-28] 11 (09/20 0700) BP: (85-121)/(52-82) 92/53 mmHg (09/20 0700) SpO2:  [88 %-99 %] 94 % (09/20 0700) Arterial Line BP: (102-138)/(48-66) 120/52 mmHg (09/20 0700) FiO2 (%):  [40 %-100 %] 50 % (09/19 2300) Weight:  [202 lb (91.627 kg)-214 lb 15.2 oz (97.5 kg)] 214 lb 15.2 oz (97.5 kg) (09/20 0500)  Filed Weights   03/31/15 0550 04/04/15 1500 04/05/15 0500  Weight: 203 lb (92.08 kg) 202 lb (91.627 kg) 214 lb 15.2 oz (97.5 kg)    Weight change:    Hemodynamic parameters for last 24 hours: PAP: (20-47)/(9-30) 30/20 mmHg CO:  [4.4 L/min-5.8 L/min] 5.8 L/min CI:  [2.1 L/min/m2-2.8 L/min/m2] 2.8 L/min/m2  Intake/Output from previous day: 09/19 0701 - 09/20 0700 In: 6846.1 [I.V.:4385.1; Blood:811; IV Piggyback:1650] Out: 5295 [Urine:3315; Blood:1600; Chest Tube:380]  Intake/Output this shift: Total I/O In: -  Out: 80 [Urine:70; Chest Tube:10]   CBGs 135-97-139-139-165-117-97-110-99-100-113-120-135  Current Meds: Scheduled Meds: . acetaminophen  1,000 mg Oral 4 times per day   Or  . acetaminophen (TYLENOL) oral liquid 160 mg/5 mL  1,000 mg Per Tube 4 times per day  . antiseptic oral rinse  7 mL Mouth Rinse QID  . aspirin EC  325 mg Oral Daily   Or  . aspirin  324 mg Per Tube Daily  . atorvastatin  80 mg Oral q1800  . bisacodyl  10 mg Oral Daily   Or  . bisacodyl  10  mg Rectal Daily  . cefUROXime (ZINACEF)  IV  1.5 g Intravenous Q12H  . chlorhexidine gluconate  15 mL Mouth Rinse BID  . docusate sodium  200 mg Oral Daily  . insulin regular  0-10 Units Intravenous TID WC  . metoCLOPramide (REGLAN) injection  10 mg Intravenous 4 times per day  . metoprolol tartrate  12.5 mg Oral BID   Or  . metoprolol tartrate  12.5 mg Per Tube BID  . [START ON 04/06/2015] pantoprazole  40 mg Oral Daily  . sodium chloride  3 mL Intravenous Q12H   Continuous Infusions: . sodium chloride 10 mL/hr at 04/04/15 1900  . sodium chloride    . sodium chloride 10 mL/hr at 04/04/15 1900  . amiodarone 30 mg/hr (04/04/15 2300)  . dexmedetomidine Stopped (04/04/15 2110)  . DOPamine 3 mcg/kg/min (04/04/15 2300)  . insulin (NOVOLIN-R) infusion 2.3 Units/hr (04/05/15 0700)  . lactated ringers    . lactated ringers 10 mL/hr at 04/04/15 1900  . nitroGLYCERIN Stopped (04/04/15 1500)  . phenylephrine (NEO-SYNEPHRINE) Adult infusion Stopped (04/04/15 1908)   PRN Meds:.sodium chloride, albumin human, lactated ringers, metoprolol, midazolam, morphine injection, ondansetron (ZOFRAN) IV, oxyCODONE, sodium chloride, traMADol  Physical Exam: General appearance: alert, cooperative and no distress Heart: regular rate  and rhythm and paced at 90 Lungs: diminished breath sounds bibasilar Extremities: Mild LE edema Wound: Dressed and dry    Lab Results: CBC: Recent Labs  04/04/15 2100 04/04/15 2108 04/05/15 0400  WBC 16.4*  --  15.3*  HGB 11.9* 11.9* 11.2*  HCT 34.5* 35.0* 31.9*  PLT 100*  --  105*   BMET:  Recent Labs  04/04/15 0221  04/04/15 2108 04/05/15 0400  NA 140  < > 141 135  K 4.1  < > 3.8 3.9  CL 104  < > 106 106  CO2 28  --   --  24  GLUCOSE 109*  < > 142* 109*  BUN 20  < > 16 17  CREATININE 1.24  < > 0.80 1.00  CALCIUM 9.3  --   --  7.4*  < > = values in this interval not displayed.  PT/INR:  Recent Labs  04/04/15 1518  LABPROT 18.4*  INR 1.52*    Radiology: Dg Chest Port 1 View  04/04/2015   CLINICAL DATA:  CABG  EXAM: PORTABLE CHEST - 1 VIEW  COMPARISON:  04/03/2015  FINDINGS: Changes of CABG. Left chest tube in place without pneumothorax. Endotracheal tube tip is 6 cm above the carina. Swan-Ganz catheter is in the main pulmonary artery. NG tube tip is in the proximal stomach with the side port near the GE junction.  There is bibasilar atelectasis. Small left pleural effusion. Mild cardiomegaly.  IMPRESSION: Changes of CABG. Bibasilar atelectasis with small left effusion. No pneumothorax.   Electronically Signed   By: Charlett Nose M.D.   On: 04/04/2015 15:37     Assessment/Plan: S/P Procedure(s) (LRB): CORONARY ARTERY BYPASS GRAFTING (CABG) (N/A) TRANSESOPHAGEAL ECHOCARDIOGRAM (TEE) (N/A)  CV- Paced at 90. BPs low normal. Gtts: Amio, Dop. Wean Dopamine as able.  Expected postop blood loss anemia- H/H generally stable.  Thrombocytopenia- plts stable.  Endo- wean insulin gtt. A1C=6.2  Mobilize, d/c lines and tubes as able, routine POD #1 progression.  COLLINS,GINA H 04/05/2015 8:53 AM  Stable postop, anxious but otherwise doing well I have seen and examined [name] and agree with the above assessment  and plan.  [name] MD Beeper [phone] Office [phone] 04/05/2015 11:16 AM

## 2015-04-05 NOTE — Progress Notes (Signed)
Utilization Review Completed.  

## 2015-04-06 ENCOUNTER — Inpatient Hospital Stay (HOSPITAL_COMMUNITY): Payer: Medicare Other

## 2015-04-06 DIAGNOSIS — I480 Paroxysmal atrial fibrillation: Secondary | ICD-10-CM

## 2015-04-06 LAB — TSH: TSH: 2.953 u[IU]/mL (ref 0.350–4.500)

## 2015-04-06 LAB — GLUCOSE, CAPILLARY
Glucose-Capillary: 119 mg/dL — ABNORMAL HIGH (ref 65–99)
Glucose-Capillary: 122 mg/dL — ABNORMAL HIGH (ref 65–99)
Glucose-Capillary: 139 mg/dL — ABNORMAL HIGH (ref 65–99)
Glucose-Capillary: 167 mg/dL — ABNORMAL HIGH (ref 65–99)
Glucose-Capillary: 173 mg/dL — ABNORMAL HIGH (ref 65–99)
Glucose-Capillary: 189 mg/dL — ABNORMAL HIGH (ref 65–99)

## 2015-04-06 LAB — BASIC METABOLIC PANEL
Anion gap: 9 (ref 5–15)
BUN: 27 mg/dL — ABNORMAL HIGH (ref 6–20)
CO2: 25 mmol/L (ref 22–32)
Calcium: 7.4 mg/dL — ABNORMAL LOW (ref 8.9–10.3)
Chloride: 103 mmol/L (ref 101–111)
Creatinine, Ser: 1.32 mg/dL — ABNORMAL HIGH (ref 0.61–1.24)
GFR calc Af Amer: >60 mL/min (ref >60)
GFR calc non Af Amer: 53 mL/min — ABNORMAL LOW (ref >60)
Glucose, Bld: 131 mg/dL — ABNORMAL HIGH (ref 65–99)
Potassium: 3.9 mmol/L (ref 3.5–5.1)
Sodium: 137 mmol/L (ref 135–145)

## 2015-04-06 LAB — POCT I-STAT 3, ART BLOOD GAS (G3+)
Acid-base deficit: 2 mmol/L (ref 0.0–2.0)
Bicarbonate: 21.9 mEq/L (ref 20.0–24.0)
O2 Saturation: 90 %
TCO2: 23 mmol/L (ref 0–100)
pCO2 arterial: 33.2 mmHg — ABNORMAL LOW (ref 35.0–45.0)
pH, Arterial: 7.428 (ref 7.350–7.450)
pO2, Arterial: 57 mmHg — ABNORMAL LOW (ref 80.0–100.0)

## 2015-04-06 LAB — CBC
HCT: 30.1 % — ABNORMAL LOW (ref 39.0–52.0)
Hemoglobin: 10.1 g/dL — ABNORMAL LOW (ref 13.0–17.0)
MCH: 32.4 pg (ref 26.0–34.0)
MCHC: 33.6 g/dL (ref 30.0–36.0)
MCV: 96.5 fL (ref 78.0–100.0)
Platelets: 120 10*3/uL — ABNORMAL LOW (ref 150–400)
RBC: 3.12 MIL/uL — ABNORMAL LOW (ref 4.22–5.81)
RDW: 13.4 % (ref 11.5–15.5)
WBC: 19.4 10*3/uL — ABNORMAL HIGH (ref 4.0–10.5)

## 2015-04-06 MED ORDER — METOPROLOL TARTRATE 25 MG PO TABS
25.0000 mg | ORAL_TABLET | Freq: Two times a day (BID) | ORAL | Status: DC
  Administered 2015-04-06 – 2015-04-11 (×10): 25 mg via ORAL
  Filled 2015-04-06 (×12): qty 1

## 2015-04-06 MED ORDER — ENOXAPARIN SODIUM 30 MG/0.3ML ~~LOC~~ SOLN
30.0000 mg | SUBCUTANEOUS | Status: DC
  Administered 2015-04-06: 30 mg via SUBCUTANEOUS
  Filled 2015-04-06 (×2): qty 0.3

## 2015-04-06 MED ORDER — METOPROLOL TARTRATE 25 MG/10 ML ORAL SUSPENSION
12.5000 mg | Freq: Two times a day (BID) | ORAL | Status: DC
  Filled 2015-04-06 (×4): qty 5

## 2015-04-06 MED ORDER — DIPHENHYDRAMINE HCL 25 MG PO CAPS: 25.0000 mg | ORAL_CAPSULE | Freq: Every evening | ORAL | Status: DC | PRN

## 2015-04-06 MED ORDER — ZOLPIDEM TARTRATE 5 MG PO TABS
5.0000 mg | ORAL_TABLET | Freq: Every evening | ORAL | Status: DC | PRN
  Administered 2015-04-06: 5 mg via ORAL
  Filled 2015-04-06: qty 1

## 2015-04-06 MED ORDER — AMIODARONE IV BOLUS ONLY 150 MG/100ML
150.0000 mg | Freq: Once | INTRAVENOUS | Status: AC
  Administered 2015-04-06: 150 mg via INTRAVENOUS

## 2015-04-06 NOTE — Progress Notes (Addendum)
T[facility] DAILY ICU PROGRESS NOTE                   [address]            [place] [address]          [phone]   2 Days Post-Op Procedure(s) (LRB): CORONARY ARTERY BYPASS GRAFTING (CABG) (N/A) TRANSESOPHAGEAL ECHOCARDIOGRAM (TEE) (N/A)  Total Length of Stay:  LOS: 6 days   Subjective: In and out of AF overnight with elevated HRs. Anxious, not resting well. Still having some nausea, but tolerating diet.   Objective: Vital signs in last 24 hours: Temp:  [97.9 F (36.6 C)-98.6 F (37 C)] 98 F (36.7 C) (09/21 0806) Pulse Rate:  [87-117] 117 (09/21 0700) Cardiac Rhythm:  [-] Sinus tachycardia (09/21 0600) Resp:  [13-24] 21 (09/21 0700) BP: (89-134)/(39-62) 134/62 mmHg (09/21 0700) SpO2:  [89 %-100 %] 100 % (09/21 0700) Arterial Line BP: (89-140)/(38-79) 96/79 mmHg (09/21 0000) FiO2 (%):  [50 %] 50 % (09/21 0100) Weight:  [215 lb 9.8 oz (97.8 kg)] 215 lb 9.8 oz (97.8 kg) (09/21 0500)  Filed Weights   04/04/15 1500 04/05/15 0500 04/06/15 0500  Weight: 202 lb (91.627 kg) 214 lb 15.2 oz (97.5 kg) 215 lb 9.8 oz (97.8 kg)    Weight change: 13 lb 9.8 oz (6.173 kg)   Hemodynamic parameters for last 24 hours: PAP: (27-44)/(12-26) 34/20 mmHg CO:  [5.4 L/min-6.8 L/min] 5.4 L/min CI:  [2.6 L/min/m2-3.3 L/min/m2] 2.6 L/min/m2  Intake/Output from previous day: 09/20 0701 - 09/21 0700 In: 1061.9 [P.O.:150; I.V.:811.9; IV Piggyback:100] Out: 1470 [Urine:1130; Chest Tube:340]  Intake/Output this shift: Total I/O In: -  Out: 65 [Urine:45; Chest Tube:20]  CBGs 208-100-119-131  Current Meds: Scheduled Meds: . acetaminophen  1,000 mg Oral 4 times per day   Or  . acetaminophen (TYLENOL) oral liquid 160 mg/5 mL  1,000 mg Per Tube 4 times per day  . antiseptic oral rinse  7 mL Mouth Rinse BID  . aspirin EC  325 mg Oral Daily   Or  . aspirin  324 mg Per Tube Daily  . atorvastatin  80 mg Oral q1800  . bisacodyl  10 mg Oral Daily   Or  . bisacodyl  10 mg Rectal  Daily  . docusate sodium  200 mg Oral Daily  . enoxaparin (LOVENOX) injection  30 mg Subcutaneous Q24H  . insulin aspart  0-24 Units Subcutaneous 6 times per day  . insulin regular  0-10 Units Intravenous TID WC  . metoCLOPramide (REGLAN) injection  10 mg Intravenous 4 times per day  . metoprolol tartrate  12.5 mg Oral BID   Or  . metoprolol tartrate  12.5 mg Per Tube BID  . pantoprazole  40 mg Oral Daily  . sodium chloride  3 mL Intravenous Q12H   Continuous Infusions: . sodium chloride Stopped (04/05/15 1300)  . sodium chloride    . sodium chloride 10 mL/hr at 04/04/15 1900  . amiodarone 30 mg/hr (04/05/15 2131)  . dexmedetomidine Stopped (04/04/15 2110)  . lactated ringers    . lactated ringers 10 mL/hr at 04/04/15 1900  . nitroGLYCERIN Stopped (04/04/15 1500)  . phenylephrine (NEO-SYNEPHRINE) Adult infusion Stopped (04/05/15 2300)   PRN Meds:.sodium chloride, lactated ringers, metoprolol, midazolam, morphine injection, ondansetron (ZOFRAN) IV, oxyCODONE, sodium chloride, traMADol   Physical Exam: General appearance: alert, cooperative and no distress Heart: irregularly irregular rhythm Lungs: clear to auscultation bilaterally Extremities: Minimal LE edema Wound: Dressed and dry  Lab Results: CBC: Recent Labs  04/05/15 1645 04/06/15 0501  WBC 24.2* 19.4*  HGB 10.9* 10.1*  HCT 32.0* 30.1*  PLT 128* 120*   BMET:  Recent Labs  04/05/15 0400 04/05/15 1637 04/05/15 1645 04/06/15 0501  NA 135 137  --  137  K 3.9 4.0  --  3.9  CL 106 101  --  103  CO2 24  --   --  25  GLUCOSE 109* 182*  --  131*  BUN 17 24*  --  27*  CREATININE 1.00 1.20 1.30* 1.32*  CALCIUM 7.4*  --   --  7.4*    PT/INR:  Recent Labs  04/04/15 1518  LABPROT 18.4*  INR 1.52*   Radiology: Dg Chest Port 1 View  04/06/2015   CLINICAL DATA:  CABG.  EXAM: PORTABLE CHEST - 1 VIEW  COMPARISON:  04/05/2015.  FINDINGS: Right IJ sheath in stable position. Interim removal of Swan-Ganz catheter.  Left chest tube in stable position. Prior CABG. Cardiomegaly with normal pulmonary vascularity. Low lung volumes with bibasilar atelectasis and/or infiltrates. Small bilateral pleural effusions. No pneumothorax.  IMPRESSION: 1. Interim removal of Swan-Ganz catheter. Right IJ sheath in stable position. Left chest tube in stable position. 2. Prior CABG.  Stable cardiomegaly. 3. Persistent bibasilar atelectasis and/or infiltrates. Small left pleural effusion .   Electronically Signed   By: Marc Fus  [name]   On: 04/06/2015 08:15     [name]/Plan: S/P Procedure(s) (LRB): CORONARY ARTERY BYPASS GRAFTING (CABG) (N/A) TRANSESOPHAGEAL ECHOCARDIOGRAM (TEE) (N/A)  CV- In and out of AF,  presently SR with freq PACs, 90-100. BPs stable. Will increase Lopressor, continue Amio.   Expected postop blood loss anemia- [name]/[name] stable.  Leukocytosis, no fever. WBC trending down.  Continue to watch.  Endo-  CBGs have generally been stable. Continue SSI.  A1C=6.2  CT output decreasing, hopefully can d/c CT soon.  Renal- Cr up slightly today, continue diuresis and monitor renal function., but not to level to consider acute kidney injury  GI- Still having some nausea, could be related to Amio. Watch closely, continue scheduled Reglan.  Ambulate in halls.   [name] 04/06/2015 8:58 AM  In and out of afib up to 130-140 this am, now sinus with frequent pac's Increase betq blocker, continue cordqrone Have cardiology follow up on afib, present acutely during cath I have seen and examined [name]  and plan.  [name] MD Beeper [phone] Office [phone] 04/06/2015 10:59 AM

## 2015-04-06 NOTE — Progress Notes (Signed)
     SUBJECTIVE: Chest sore.   Tele: sinus with PACs  BP 100/46 mmHg  Pulse 89  Temp(Src) 98.2 F (36.8 C) (Oral)  Resp 20  Ht  (1.753 m)  Wt 215 lb 9.8 oz (97.8 kg)  BMI 31.83 kg/m2  SpO2 97%  Intake/Output Summary (Last 24 hours) at 04/06/15 1431 Last data filed at 04/06/15 1200  Gross per 24 hour  Intake  895.4 ml  Output   1100 ml  Net -204.6 ml    PHYSICAL EXAM General: Well developed, well nourished, in no acute distress. Alert and oriented x 3.  Psych:  Good affect, responds appropriately Neck: No JVD. No masses noted.  Lungs: Clear bilaterally with no wheezes or rhonci noted.  Heart: RRR with no murmurs noted. Abdomen: Bowel sounds are present. Soft, non-tender.  Extremities: No lower extremity edema.   LABS: Basic Metabolic Panel:  Recent Labs  16/10/96 0400 04/05/15 1637 04/05/15 1645 04/06/15 0501  NA 135 137  --  137  K 3.9 4.0  --  3.9  CL 106 101  --  103  CO2 24  --   --  25  GLUCOSE 109* 182*  --  131*  BUN 17 24*  --  27*  CREATININE 1.00 1.20 1.30* 1.32*  CALCIUM 7.4*  --   --  7.4*  MG 2.3  --  2.1  --    CBC:  Recent Labs  04/05/15 1645 04/06/15 0501  WBC 24.2* 19.4*  HGB 10.9* 10.1*  HCT 32.0* 30.1*  MCV 95.2 96.5  PLT 128* 120*   Current Meds: . acetaminophen  1,000 mg Oral 4 times per day   Or  . acetaminophen (TYLENOL) oral liquid 160 mg/5 mL  1,000 mg Per Tube 4 times per day  . antiseptic oral rinse  7 mL Mouth Rinse BID  . aspirin EC  325 mg Oral Daily   Or  . aspirin  324 mg Per Tube Daily  . atorvastatin  80 mg Oral q1800  . bisacodyl  10 mg Oral Daily   Or  . bisacodyl  10 mg Rectal Daily  . docusate sodium  200 mg Oral Daily  . enoxaparin (LOVENOX) injection  30 mg Subcutaneous Q24H  . insulin aspart  0-24 Units Subcutaneous 6 times per day  . insulin regular  0-10 Units Intravenous TID WC  . metoCLOPramide (REGLAN) injection  10 mg Intravenous 4 times per day  . metoprolol tartrate  25 mg Oral BID     Or  . metoprolol tartrate  12.5 mg Per Tube BID  . pantoprazole  40 mg Oral Daily  . sodium chloride  3 mL Intravenous Q12H     ASSESSMENT AND PLAN:  1. CAD: s/p CABG.  He is stable. Continue ASA, statin, beta blocker.   2. Atrial fibrillation, paroxysmal: He did have recurrence of atrial fib this am with RVR. He was bolused with amiodarone this am. Now on amiodarone drip and beta blocker. The atrial fib will likely be recurrent in the post-operative period. No new recommendations today. Will follow.   [name],CHRISTOPHER  9/21/20162:31 PM

## 2015-04-06 NOTE — Progress Notes (Signed)
      [address]       [place] [employer] [address]             [phone]      POD # 2 CABG  Sleeping currently  In SR @ 85  BP 165/66 mmHg  Pulse 102  Temp(Src) 98.8 F (37.1 C) (Oral)  Resp 18  Ht  (1.753 m)  Wt 215 lb 9.8 oz (97.8 kg)  BMI 31.83 kg/m2  SpO2 100%   Intake/Output Summary (Last 24 hours) at 04/06/15 1800 Last data filed at 04/06/15 1700  Gross per 24 hour  Intake 747.08 ml  Output   1050 ml  Net -302.92 ml    Continue present care  Viviann Spare C. Dorris Fetch, MD [facility] [phone]

## 2015-04-07 ENCOUNTER — Inpatient Hospital Stay (HOSPITAL_COMMUNITY): Payer: Medicare Other

## 2015-04-07 LAB — CBC
HCT: 27.4 % — ABNORMAL LOW (ref 39.0–52.0)
HCT: 25.9 % — ABNORMAL LOW (ref 39.0–52.0)
Hemoglobin: 9.4 g/dL — ABNORMAL LOW (ref 13.0–17.0)
Hemoglobin: 9.2 g/dL — ABNORMAL LOW (ref 13.0–17.0)
MCH: 33.1 pg (ref 26.0–34.0)
MCH: 34.5 pg — ABNORMAL HIGH (ref 26.0–34.0)
MCHC: 34.3 g/dL (ref 30.0–36.0)
MCHC: 35.5 g/dL (ref 30.0–36.0)
MCV: 96.5 fL (ref 78.0–100.0)
MCV: 97 fL (ref 78.0–100.0)
Platelets: 120 10*3/uL — ABNORMAL LOW (ref 150–400)
Platelets: 120 K/uL — ABNORMAL LOW (ref 150–400)
RBC: 2.84 MIL/uL — ABNORMAL LOW (ref 4.22–5.81)
RBC: 2.67 MIL/uL — ABNORMAL LOW (ref 4.22–5.81)
RDW: 13.5 % (ref 11.5–15.5)
RDW: 13.7 % (ref 11.5–15.5)
WBC: 17.6 10*3/uL — ABNORMAL HIGH (ref 4.0–10.5)
WBC: 16.6 K/uL — ABNORMAL HIGH (ref 4.0–10.5)

## 2015-04-07 LAB — BASIC METABOLIC PANEL
Anion gap: 8 (ref 5–15)
BUN: 33 mg/dL — ABNORMAL HIGH (ref 6–20)
CO2: 25 mmol/L (ref 22–32)
Calcium: 7.6 mg/dL — ABNORMAL LOW (ref 8.9–10.3)
Chloride: 100 mmol/L — ABNORMAL LOW (ref 101–111)
Creatinine, Ser: 1.32 mg/dL — ABNORMAL HIGH (ref 0.61–1.24)
GFR calc Af Amer: >60 mL/min (ref >60)
GFR calc non Af Amer: 53 mL/min — ABNORMAL LOW (ref >60)
Glucose, Bld: 134 mg/dL — ABNORMAL HIGH (ref 65–99)
Potassium: 3.8 mmol/L (ref 3.5–5.1)
Sodium: 133 mmol/L — ABNORMAL LOW (ref 135–145)

## 2015-04-07 LAB — GLUCOSE, CAPILLARY
Glucose-Capillary: 123 mg/dL — ABNORMAL HIGH (ref 65–99)
Glucose-Capillary: 125 mg/dL — ABNORMAL HIGH (ref 65–99)
Glucose-Capillary: 151 mg/dL — ABNORMAL HIGH (ref 65–99)
Glucose-Capillary: 148 mg/dL — ABNORMAL HIGH (ref 65–99)
Glucose-Capillary: 120 mg/dL — ABNORMAL HIGH (ref 65–99)

## 2015-04-07 LAB — MAGNESIUM: Magnesium: 2.3 mg/dL (ref 1.7–2.4)

## 2015-04-07 MED ORDER — CLOPIDOGREL BISULFATE 75 MG PO TABS
75.0000 mg | ORAL_TABLET | Freq: Every day | ORAL | Status: DC
Start: 2015-04-07 — End: 2015-04-07
  Administered 2015-04-07: 75 mg via ORAL
  Filled 2015-04-07: qty 1

## 2015-04-07 MED ORDER — GABAPENTIN 400 MG PO CAPS
400.0000 mg | ORAL_CAPSULE | Freq: Three times a day (TID) | ORAL | Status: DC
  Administered 2015-04-07 – 2015-04-11 (×12): 400 mg via ORAL
  Filled 2015-04-07 (×14): qty 1

## 2015-04-07 MED ORDER — OXYCODONE HCL 5 MG PO TABS: 5.0000 mg | ORAL_TABLET | ORAL | Status: DC | PRN

## 2015-04-07 MED ORDER — DOCUSATE SODIUM 100 MG PO CAPS: 200.0000 mg | ORAL_CAPSULE | Freq: Every day | ORAL | Status: DC

## 2015-04-07 MED ORDER — ONDANSETRON HCL 4 MG PO TABS: 4.0000 mg | ORAL_TABLET | Freq: Four times a day (QID) | ORAL | Status: DC | PRN

## 2015-04-07 MED ORDER — FUROSEMIDE 40 MG PO TABS
40.0000 mg | ORAL_TABLET | Freq: Every day | ORAL | Status: DC
  Administered 2015-04-07 – 2015-04-11 (×5): 40 mg via ORAL
  Filled 2015-04-07 (×7): qty 1

## 2015-04-07 MED ORDER — CLOPIDOGREL BISULFATE 75 MG PO TABS: 75.0000 mg | ORAL_TABLET | Freq: Every day | ORAL | Status: DC

## 2015-04-07 MED ORDER — ASPIRIN EC 81 MG PO TBEC: 81.0000 mg | DELAYED_RELEASE_TABLET | Freq: Every day | ORAL | Status: DC

## 2015-04-07 MED ORDER — SODIUM CHLORIDE 0.9 % IJ SOLN: 3.0000 mL | INTRAMUSCULAR | Status: DC | PRN

## 2015-04-07 MED ORDER — FINASTERIDE 5 MG PO TABS
5.0000 mg | ORAL_TABLET | Freq: Every day | ORAL | Status: DC
  Administered 2015-04-07 – 2015-04-11 (×5): 5 mg via ORAL
  Filled 2015-04-07 (×5): qty 1

## 2015-04-07 MED ORDER — ONDANSETRON HCL 4 MG/2ML IJ SOLN
4.0000 mg | Freq: Four times a day (QID) | INTRAMUSCULAR | Status: DC | PRN
  Administered 2015-04-07: 4 mg via INTRAVENOUS
  Filled 2015-04-07: qty 2

## 2015-04-07 MED ORDER — BISACODYL 10 MG RE SUPP: 10.0000 mg | Freq: Every day | RECTAL | Status: DC | PRN

## 2015-04-07 MED ORDER — POTASSIUM CHLORIDE CRYS ER 20 MEQ PO TBCR
20.0000 meq | EXTENDED_RELEASE_TABLET | Freq: Every day | ORAL | Status: DC
  Administered 2015-04-07 – 2015-04-11 (×5): 20 meq via ORAL
  Filled 2015-04-07 (×7): qty 1

## 2015-04-07 MED ORDER — AMIODARONE HCL 200 MG PO TABS: 200.0000 mg | ORAL_TABLET | Freq: Every day | ORAL | Status: DC

## 2015-04-07 MED ORDER — SODIUM CHLORIDE 0.9 % IJ SOLN
3.0000 mL | Freq: Two times a day (BID) | INTRAMUSCULAR | Status: DC
  Administered 2015-04-07 – 2015-04-08 (×2): 3 mL via INTRAVENOUS
  Administered 2015-04-08: 6 mL via INTRAVENOUS

## 2015-04-07 MED ORDER — AMIODARONE HCL 200 MG PO TABS
200.0000 mg | ORAL_TABLET | Freq: Two times a day (BID) | ORAL | Status: DC
  Administered 2015-04-07 – 2015-04-11 (×9): 200 mg via ORAL
  Filled 2015-04-07 (×10): qty 1

## 2015-04-07 MED ORDER — SODIUM CHLORIDE 0.9 % IV SOLN: 250.0000 mL | INTRAVENOUS | Status: DC | PRN

## 2015-04-07 MED ORDER — POTASSIUM CHLORIDE CRYS ER 20 MEQ PO TBCR: 20.0000 meq | EXTENDED_RELEASE_TABLET | Freq: Two times a day (BID) | ORAL | Status: DC

## 2015-04-07 MED ORDER — INSULIN ASPART 100 UNIT/ML ~~LOC~~ SOLN: 0.0000 [IU] | SUBCUTANEOUS | Status: DC

## 2015-04-07 MED ORDER — BISACODYL 5 MG PO TBEC: 10.0000 mg | DELAYED_RELEASE_TABLET | Freq: Every day | ORAL | Status: DC | PRN

## 2015-04-07 MED ORDER — ASPIRIN EC 81 MG PO TBEC
81.0000 mg | DELAYED_RELEASE_TABLET | Freq: Every day | ORAL | Status: DC
  Administered 2015-04-07 – 2015-04-11 (×5): 81 mg via ORAL
  Filled 2015-04-07 (×5): qty 1

## 2015-04-07 MED ORDER — MOVING RIGHT ALONG BOOK
Freq: Once | Status: AC
  Administered 2015-04-07: 09:00:00
  Filled 2015-04-07: qty 1

## 2015-04-07 MED ORDER — PANTOPRAZOLE SODIUM 40 MG PO TBEC
40.0000 mg | DELAYED_RELEASE_TABLET | Freq: Every day | ORAL | Status: DC
  Administered 2015-04-07 – 2015-04-11 (×5): 40 mg via ORAL
  Filled 2015-04-07 (×5): qty 1

## 2015-04-07 MED ORDER — ASPIRIN 81 MG PO CHEW: 81.0000 mg | CHEWABLE_TABLET | Freq: Every day | ORAL | Status: DC

## 2015-04-07 MED ORDER — ALPRAZOLAM 0.25 MG PO TABS
0.2500 mg | ORAL_TABLET | Freq: Four times a day (QID) | ORAL | Status: DC | PRN
  Administered 2015-04-07 – 2015-04-10 (×4): 0.25 mg via ORAL
  Filled 2015-04-07 (×4): qty 1

## 2015-04-07 MED ORDER — AMIODARONE HCL 200 MG PO TABS
400.0000 mg | ORAL_TABLET | Freq: Two times a day (BID) | ORAL | Status: DC
  Filled 2015-04-07 (×2): qty 2

## 2015-04-07 MED ORDER — TRAMADOL HCL 50 MG PO TABS: 50.0000 mg | ORAL_TABLET | ORAL | Status: DC | PRN

## 2015-04-07 MED ORDER — INSULIN ASPART 100 UNIT/ML ~~LOC~~ SOLN
0.0000 [IU] | Freq: Three times a day (TID) | SUBCUTANEOUS | Status: DC
  Administered 2015-04-07 – 2015-04-08 (×4): 2 [IU] via SUBCUTANEOUS
  Administered 2015-04-08: 4 [IU] via SUBCUTANEOUS
  Administered 2015-04-09: 2 [IU] via SUBCUTANEOUS
  Administered 2015-04-09: 4 [IU] via SUBCUTANEOUS
  Administered 2015-04-09: 2 [IU] via SUBCUTANEOUS
  Administered 2015-04-10: 4 [IU] via SUBCUTANEOUS
  Administered 2015-04-10 (×3): 2 [IU] via SUBCUTANEOUS
  Administered 2015-04-11: 4 [IU] via SUBCUTANEOUS
  Administered 2015-04-11: 2 [IU] via SUBCUTANEOUS

## 2015-04-07 NOTE — Progress Notes (Signed)
Unable to flush left forearm IV.  D/C'd cath intact no bleeding at site

## 2015-04-07 NOTE — Progress Notes (Signed)
  Amiodarone Drug - Drug Interaction Consult Note  Recommendations: No contraindications to therapy. Monitoring required for medication classes checked below.  Amiodarone is metabolized by the cytochrome P450 system and therefore has the potential to cause many drug interactions. Amiodarone has an average plasma half-life of 50 days (range 20 to 100 days).   There is potential for drug interactions to occur several weeks or months after stopping treatment and the onset of drug interactions may be slow after initiating amiodarone.    Statins: Increased risk of myopathy. Simvastatin- restrict dose to  daily. Other statins: counsel patients to report any muscle pain or weakness immediately.   Anticoagulants: Amiodarone can increase anticoagulant effect. Consider warfarin dose reduction. Patients should be monitored closely and the dose of anticoagulant altered accordingly, remembering that amiodarone levels take several weeks to stabilize.   Antiepileptics: Amiodarone can increase plasma concentration of phenytoin, the dose should be reduced. Note that small changes in phenytoin dose can result in large changes in levels. Monitor patient and counsel on signs of toxicity.   Beta blockers: increased risk of bradycardia, AV block and myocardial depression. Sotalol - avoid concomitant use.    Calcium channel blockers (diltiazem and verapamil): increased risk of bradycardia, AV block and myocardial depression.    Cyclosporine: Amiodarone increases levels of cyclosporine. Reduced dose of cyclosporine is recommended.   Digoxin dose should be halved when amiodarone is started.   Diuretics: increased risk of cardiotoxicity if hypokalemia occurs.   Oral hypoglycemic agents (glyburide, glipizide, glimepiride): increased risk of hypoglycemia. Patient's glucose levels should be monitored closely when initiating amiodarone therapy.    Drugs that prolong the QT interval:   Torsades de pointes risk may be increased with concurrent use - avoid if possible.  Monitor QTc, also keep magnesium/potassium WNL if concurrent therapy can't be avoided. Marland Kitchen Antibiotics: e.g. fluoroquinolones, erythromycin. . Antiarrhythmics: e.g. quinidine, procainamide, disopyramide, sotalol. . Antipsychotics: e.g. phenothiazines, haloperidol.  . Lithium, tricyclic antidepressants, and methadone.  Thank You,  Loura Back, [address]D., BCPS Clinical Pharmacist Pager: [phone] 04/07/2015 9:11 AM

## 2015-04-07 NOTE — Progress Notes (Signed)
Pt sternum began bleeding when pt returned from walk with PT. Pressure was held for a good 30 minutes without resolution. MD was called to bedside to assess pt. Sutures placed at bedside, bleeding subsided. No issues at this time. Will continue to monitor.

## 2015-04-07 NOTE — Progress Notes (Addendum)
TCTS DAILY ICU PROGRESS NOTE                   [address]            [name] [address]          [phone]   3 Days Post-Op Procedure(s) (LRB): CORONARY ARTERY BYPASS GRAFTING (CABG) (N/A) TRANSESOPHAGEAL ECHOCARDIOGRAM (TEE) (N/A)  Total Length of Stay:  LOS: 7 days   Subjective: Just walked in hall with nurse. Tired, still not resting well. Appetite a little better, +BM.   Objective: Vital signs in last 24 hours: Temp:  [98 F (36.7 C)-98.8 F (37.1 C)] 98.6 F (37 C) (09/22 0400) Pulse Rate:  [81-106] 93 (09/22 0700) Cardiac Rhythm:  [-] Normal sinus rhythm (09/22 0400) Resp:  [13-29] 22 (09/22 0700) BP: (91-165)/(39-121) 110/57 mmHg (09/22 0700) SpO2:  [95 %-100 %] 100 % (09/22 0700) Weight:  [214 lb 8.1 oz (97.3 kg)] 214 lb 8.1 oz (97.3 kg) (09/22 0400)  Filed Weights   04/05/15 0500 04/06/15 0500 04/07/15 0400  Weight: 214 lb 15.2 oz (97.5 kg) 215 lb 9.8 oz (97.8 kg) 214 lb 8.1 oz (97.3 kg)    Weight change: -1 lb 1.6 oz (-0.5 kg)   Hemodynamic parameters for last 24 hours:    Intake/Output from previous day: 09/21 0701 - 09/22 0700 In: 647.2 [I.V.:647.2] Out: 955 [Urine:885; Chest Tube:70]  CBGs 122-139-123-134     Current Meds: Scheduled Meds: . acetaminophen  1,000 mg Oral 4 times per day   Or  . acetaminophen (TYLENOL) oral liquid 160 mg/5 mL  1,000 mg Per Tube 4 times per day  . antiseptic oral rinse  7 mL Mouth Rinse BID  . aspirin EC  325 mg Oral Daily   Or  . aspirin  324 mg Per Tube Daily  . atorvastatin  80 mg Oral q1800  . bisacodyl  10 mg Oral Daily   Or  . bisacodyl  10 mg Rectal Daily  . docusate sodium  200 mg Oral Daily  . enoxaparin (LOVENOX) injection  30 mg Subcutaneous Q24H  . insulin aspart  0-24 Units Subcutaneous 6 times per day  . insulin regular  0-10 Units Intravenous TID WC  . metoCLOPramide (REGLAN) injection  10 mg Intravenous 4 times per day  . metoprolol tartrate  25 mg Oral BID   Or  .  metoprolol tartrate  12.5 mg Per Tube BID  . pantoprazole  40 mg Oral Daily  . sodium chloride  3 mL Intravenous Q12H   Continuous Infusions: . sodium chloride Stopped (04/05/15 1300)  . sodium chloride    . sodium chloride 10 mL/hr at 04/04/15 1900  . amiodarone 30 mg/hr (04/07/15 0335)  . dexmedetomidine Stopped (04/04/15 2110)  . lactated ringers    . lactated ringers 10 mL/hr at 04/07/15 0336  . nitroGLYCERIN Stopped (04/04/15 1500)  . phenylephrine (NEO-SYNEPHRINE) Adult infusion Stopped (04/05/15 2300)   PRN Meds:.sodium chloride, lactated ringers, metoprolol, midazolam, morphine injection, ondansetron (ZOFRAN) IV, oxyCODONE, sodium chloride, traMADol, zolpidem   Physical Exam: General appearance: alert, cooperative and no distress Heart: regular rate and rhythm Lungs: diminished breath sounds bibasilar Extremities: Mild LE edema Wound: Intact, small amount bloody drainage from lower part of sternum. No erythema. Legs wounds clean and dry.    Lab Results: CBC: Recent Labs  04/06/15 0501 04/07/15 0403  WBC 19.4* 17.6*  HGB 10.1* 9.4*  HCT 30.1* 27.4*  PLT 120* 120*   BMET:  Recent Labs  04/06/15 0501 04/07/15 0403  NA 137 133*  K 3.9 3.8  CL 103 100*  CO2 25 25  GLUCOSE 131* 134*  BUN 27* 33*  CREATININE 1.32* 1.32*  CALCIUM 7.4* 7.6*    PT/INR:  Recent Labs  04/04/15 1518  LABPROT 18.4*  INR 1.52*   Radiology: No results found.   Assessment/Plan: S/P Procedure(s) (LRB): CORONARY ARTERY BYPASS GRAFTING (CABG) (N/A) TRANSESOPHAGEAL ECHOCARDIOGRAM (TEE) (N/A)  CV- AF, now maintaining SR. BPs low normal but stable. Continue Lopressor, will switch Amio from IV to po. Appreciate cardiology's input.  Would potentially need anticoagulation if AF recurs.  Expected postop blood loss anemia- H/H generally stable.  Leukocytosis, no fever. WBC trending down. Continue to watch. Probably related to atelectasis.  Pulm- still on 4L O2. Encouraged  increased IS/pulm toilet. Wean O2 as able.  Endo- CBGs stable on SSI. A1C=6.2. Not on meds prior to admission.  Renal- Cr stabilized, will watch.  Vol overload-  continue diuresis.  Deconditioning- continue ambulation, CRPI.  Hopefully can tx to stepdown today.   COLLINS,GINA H 04/07/2015 7:48 AM  To stepdown  this am Holding sinus rhythm I have seen and examined [name] and agree with the above assessment  and plan.  [name] MD Beeper [phone] Office [phone] 04/07/2015 8:16 AM

## 2015-04-07 NOTE — Evaluation (Signed)
Physical Therapy Evaluation Patient Details Name: [name] MRN: [id number] DOB: 12-04-1945 Today's Date: 04/07/2015   History of Present Illness  Pt is a 69 y/o male admitted s/p CABG x5 on 04/04/15.   Clinical Impression  Pt admitted with above diagnosis. Pt currently with functional limitations due to the deficits listed below (see PT Problem List). At the time of PT eval pt was able to perform transfers and ambulation with mod assist to get to EOB and min guard assist for ambulation. Pt moving very slow and guarded at this time. Pt will benefit from skilled PT to increase their independence and safety with mobility to allow discharge to the venue listed below.       Follow Up Recommendations Home health PT;Supervision for mobility/OOB    Equipment Recommendations  Rolling walker with 5" wheels;3in1 (PT)    Recommendations for Other Services       Precautions / Restrictions Precautions Precautions: Fall;Sternal Precaution Comments: Reviewed sternal precautions. Required frequent cueing Restrictions Weight Bearing Restrictions: No Other Position/Activity Restrictions: Sternal precautions      Mobility  Bed Mobility Overal bed mobility: Needs Assistance Bed Mobility: Supine to Sit     Supine to sit: Mod assist     General bed mobility comments: Assist to elevate trunk to full sitting position. Pt was cued to roll to the side however wanted to sit straight up instead. Will need continued bed mobility training to maintain sternal precautions.  Transfers Overall transfer level: Needs assistance Equipment used: Pushed w/c Transfers: Sit to/from Stand Sit to Stand: Min guard         General transfer comment: Rocking at EOB to gain momentum. Was able to stand with hands-on guarding for support but no real physical assist.   Ambulation/Gait Ambulation/Gait assistance: Min guard Ambulation Distance (Feet): 287 Feet Assistive device: Rolling walker (2 wheeled) Gait  Pattern/deviations: Step-through pattern;Decreased stride length;Trunk flexed Gait velocity: Decreased Gait velocity interpretation: Below normal speed for age/gender General Gait Details: Pt was able to ambulate fairly steady but slow in hall. Pt was cued to try walking a bit faster but made no corrective changes.   Stairs            Wheelchair Mobility    Modified Rankin (Stroke Patients Only)       Balance Overall balance assessment: Needs assistance Sitting-balance support: Feet supported;No upper extremity supported Sitting balance-Leahy Scale: Fair     Standing balance support: Bilateral upper extremity supported;During functional activity Standing balance-Leahy Scale: Poor Standing balance comment: Requires UE support at this time to maintain standing balance                             Pertinent Vitals/Pain Pain Assessment: 0-10 Pain Score: 2  Pain Location: Incision Pain Descriptors / Indicators: Operative site guarding Pain Intervention(s): Limited activity within patient's tolerance;Monitored during session;Repositioned    Home Living Family/patient expects to be discharged to:: Private residence Living Arrangements: Spouse/significant other Available Help at Discharge: Family;Available 24 hours/day Type of Home: House Home Access: Level entry     Home Layout: One level Home Equipment: Cane - single point      Prior Function Level of Independence: Independent               Hand Dominance   Dominant Hand: Right    Extremity/Trunk Assessment   Upper Extremity Assessment: Defer to OT evaluation  Lower Extremity Assessment: Overall WFL for tasks assessed      Cervical / Trunk Assessment: Normal  Communication   Communication: No difficulties  Cognition Arousal/Alertness: Awake/alert Behavior During Therapy: WFL for tasks assessed/performed Overall Cognitive Status: Within Functional Limits for tasks assessed                       General Comments      Exercises        Assessment/Plan    PT Assessment Patient needs continued PT services  PT Diagnosis Difficulty walking;Acute pain   PT Problem List Decreased strength;Decreased range of motion;Decreased activity tolerance;Decreased balance;Decreased mobility;Decreased knowledge of use of DME;Decreased safety awareness;Decreased knowledge of precautions  PT Treatment Interventions DME instruction;Gait training;Stair training;Functional mobility training;Therapeutic activities;Therapeutic exercise;Neuromuscular re-education;Patient/family education   PT Goals (Current goals can be found in the Care Plan section) Acute Rehab PT Goals Patient Stated Goal: Return home PT Goal Formulation: With patient Time For Goal Achievement: 04/14/15 Potential to Achieve Goals: Good    Frequency Min 3X/week   Barriers to discharge        Co-evaluation               End of Session Equipment Utilized During Treatment: Gait belt Activity Tolerance: Patient tolerated treatment well Patient left: in chair;with call bell/phone within reach;with nursing/sitter in room;with family/visitor present Nurse Communication: Mobility status         Time: 1207-1235 PT Time Calculation (min) (ACUTE ONLY): 28 min   Charges:   PT Evaluation $Initial PT Evaluation Tier I: 1 Procedure PT Treatments $Gait Training: 8-22 mins   PT G Codes:        Conni Slipper 2015/04/19, 1:38 PM   Conni Slipper, PT, DPT Acute Rehabilitation Services Pager: [phone]

## 2015-04-07 NOTE — Progress Notes (Signed)
Patient ID: [name], male   DOB: 20-Jun-1946, 69 y.o.   MRN: [id number] SICU Evening Rounds:  Hemodynamically stable  Had bleeding from chest incision today felt to be due to skin edges. Sutured.  Small decrease in Hgb from 9.4 this am to 9.2 this pm.  Urine output ok

## 2015-04-07 NOTE — Progress Notes (Signed)
Called by nursing from bleeding from sternotomy.  She states it started around 12:30 and would not stop despite application of pressure.  I presented to patient's room at which point his sternotomy was oozing bright red blood along the inferior portion.  This appeared to be coming from the skin edges.  Dr. Tyrone Sage also presented at bedside and after evaluation felt the patient would benefit from some sutures being placed at site of bleeding:  Procedure:  Area was prepped and draped in sterile fashion.  Desired areas along sternotomy with locally anesthesized with 5 cc of 1% Lidocaine.  4 interuppted 3-0 Nylon sutures were placed.  This achieved hemostasis. The wound was cleaned and a dressing was placed.  The patient tolerated the procedure without difficulty.   We will hold Plavix and Lovenox for now.  Repeat H/H in AM, leave patient in ICU for observation today

## 2015-04-07 NOTE — Progress Notes (Addendum)
     SUBJECTIVE: Mild SOB.   Tele: NSR with PACs  BP 94/51 mmHg  Pulse 95  Temp(Src) 98.6 F (37 C) (Oral)  Resp 25  Ht  (1.753 m)  Wt 214 lb 8.1 oz (97.3 kg)  BMI 31.66 kg/m2  SpO2 100%  Intake/Output Summary (Last 24 hours) at 04/07/15 0651 Last data filed at 04/07/15 0600  Gross per 24 hour  Intake  673.9 ml  Output    955 ml  Net -281.1 ml    PHYSICAL EXAM General: Well developed, well nourished, in no acute distress. Alert and oriented x 3.  Psych:  Good affect, responds appropriately Neck: No JVD. No masses noted.  Lungs: Clear bilaterally with no wheezes or rhonci noted.  Heart: RRR with no murmurs noted. Abdomen: Bowel sounds are present. Soft, non-tender.  Extremities: No lower extremity edema.   LABS: Basic Metabolic Panel:  Recent Labs  16/10/96 1645 04/06/15 0501 04/07/15 0403  NA  --  137 133*  K  --  3.9 3.8  CL  --  103 100*  CO2  --  25 25  GLUCOSE  --  131* 134*  BUN  --  27* 33*  CREATININE 1.30* 1.32* 1.32*  CALCIUM  --  7.4* 7.6*  MG 2.1  --  2.3   CBC:  Recent Labs  04/06/15 0501 04/07/15 0403  WBC 19.4* 17.6*  HGB 10.1* 9.4*  HCT 30.1* 27.4*  MCV 96.5 96.5  PLT 120* 120*    Current Meds: . acetaminophen  1,000 mg Oral 4 times per day   Or  . acetaminophen (TYLENOL) oral liquid 160 mg/5 mL  1,000 mg Per Tube 4 times per day  . antiseptic oral rinse  7 mL Mouth Rinse BID  . aspirin EC  325 mg Oral Daily   Or  . aspirin  324 mg Per Tube Daily  . atorvastatin  80 mg Oral q1800  . bisacodyl  10 mg Oral Daily   Or  . bisacodyl  10 mg Rectal Daily  . docusate sodium  200 mg Oral Daily  . enoxaparin (LOVENOX) injection  30 mg Subcutaneous Q24H  . insulin aspart  0-24 Units Subcutaneous 6 times per day  . insulin regular  0-10 Units Intravenous TID WC  . metoCLOPramide (REGLAN) injection  10 mg Intravenous 4 times per day  . metoprolol tartrate  25 mg Oral BID   Or  . metoprolol tartrate  12.5 mg Per Tube BID  .  pantoprazole  40 mg Oral Daily  . sodium chloride  3 mL Intravenous Q12H    ASSESSMENT AND PLAN:  1. CAD: s/p CABG. He is stable. Continue ASA, statin, beta blocker.   2. Atrial fibrillation, paroxysmal: Maintaining sinus. Several shorts runs of NSVT overnight. Would change to po amiodarone. If he has recurrence of atrial fibrillation before discharge would need to consider at least short term anti-coagulation. No new recommendations today. Will follow.    3. Post op volume overload: Would diurese today  [name],[name]  9/22/20166:51 AM

## 2015-04-08 ENCOUNTER — Inpatient Hospital Stay (HOSPITAL_COMMUNITY): Payer: Medicare Other

## 2015-04-08 LAB — BASIC METABOLIC PANEL
Anion gap: 6 (ref 5–15)
BUN: 32 mg/dL — ABNORMAL HIGH (ref 6–20)
CO2: 28 mmol/L (ref 22–32)
Calcium: 7.8 mg/dL — ABNORMAL LOW (ref 8.9–10.3)
Chloride: 106 mmol/L (ref 101–111)
Creatinine, Ser: 1.2 mg/dL (ref 0.61–1.24)
GFR calc Af Amer: >60 mL/min (ref >60)
GFR calc non Af Amer: 60 mL/min — ABNORMAL LOW (ref >60)
Glucose, Bld: 129 mg/dL — ABNORMAL HIGH (ref 65–99)
Potassium: 3.8 mmol/L (ref 3.5–5.1)
Sodium: 140 mmol/L (ref 135–145)

## 2015-04-08 LAB — GLUCOSE, CAPILLARY
Glucose-Capillary: 128 mg/dL — ABNORMAL HIGH (ref 65–99)
Glucose-Capillary: 179 mg/dL — ABNORMAL HIGH (ref 65–99)
Glucose-Capillary: 108 mg/dL — ABNORMAL HIGH (ref 65–99)
Glucose-Capillary: 154 mg/dL — ABNORMAL HIGH (ref 65–99)

## 2015-04-08 LAB — CBC
HCT: 26 % — ABNORMAL LOW (ref 39.0–52.0)
Hemoglobin: 8.8 g/dL — ABNORMAL LOW (ref 13.0–17.0)
MCH: 33.1 pg (ref 26.0–34.0)
MCHC: 33.8 g/dL (ref 30.0–36.0)
MCV: 97.7 fL (ref 78.0–100.0)
Platelets: 135 10*3/uL — ABNORMAL LOW (ref 150–400)
RBC: 2.66 MIL/uL — ABNORMAL LOW (ref 4.22–5.81)
RDW: 13.5 % (ref 11.5–15.5)
WBC: 12.6 10*3/uL — ABNORMAL HIGH (ref 4.0–10.5)

## 2015-04-08 MED ORDER — FOLIC ACID 1 MG PO TABS
1.0000 mg | ORAL_TABLET | Freq: Every day | ORAL | Status: DC
  Administered 2015-04-08 – 2015-04-11 (×4): 1 mg via ORAL
  Filled 2015-04-08 (×4): qty 1

## 2015-04-08 NOTE — Telephone Encounter (Signed)
Patient needs to be scheduled for a cystoscopy with urine cytology.

## 2015-04-08 NOTE — Progress Notes (Signed)
CT surgery p.m. Rounds  Patient examined and record reviewed.Hemodynamics stable,labs satisfactory.Patient had stable day.Continue current care. [name] 04/08/2015   

## 2015-04-08 NOTE — Care Management Important Message (Signed)
Important Message  Patient Details  Name: Barrett Goldie MRN: [id number] Date of Birth: 1945-11-18   Medicare Important Message Given:  Yes-third notification given    Bernadette Hoit 04/08/2015, 10:35 AM

## 2015-04-08 NOTE — Discharge Summary (Signed)
[address]       [name] [address]             [phone]         Discharge Summary  Name: Abraham Entwistle DOB: May 03, 1946 69 y.o. MRN: [id number]   Admission Date: 03/31/2015 Discharge Date: 04/11/2015    Admitting Diagnosis: Severe 3 vessel coronary artery disease New onset atrial fibrillation   Discharge Diagnosis:  Severe 3 vessel coronary artery disease Atrial fibrillation Expected postoperative blood loss anemia Acute kidney injury  Past Medical History  Diagnosis Date  . Coronary artery disease     By report  . Peripheral vascular disease     Bilateral ABIs 0.8  . Chronic distal aortic occlusion     By report  . Diabetes mellitus     By his report, this is prediabetes   . SOB (shortness of breath)   . Hyperlipidemia LDL goal <70   . Essential hypertension   . Weakness   . Erectile dysfunction   . Hypogonadism in male     Previously on testosterone injections, but has not yet restarted until further testing  . Enlarged prostate   . Arthritis   . Depression   . History of kidney stones   . Muscle pain      Procedures: CORONARY ARTERY BYPASS GRAFTING x 5  - 04/04/2015  Left internal mammary artery to left anterior descending  Saphenous vein graft to diagonal  Saphenous vein graft to obtuse marginal   Sequential saphenous vein graft to posterior descending and distal left circumflex  Endoscopic greater saphenous vein harvest right leg and left thigh    HPI:  The patient is a 69 y.o. male with no known previous CAD. He was previously followed by Dr. Elease Hashimoto, however he relocated to [facility]. He recently moved back to the [place] area this spring. The patient followed up with Dr. Elease Hashimoto in May of this year at which time he was not experiencing symptoms. He was told at some point that he had an occlusion in his distal aorta. He was evaluated by Dr. Hart Rochester in June at which time he was experiencing claudication in  both legs, the R>L. He can ambulate a quarter of a mile prior to developing claudication symptoms. ABIs were obtained and showed mild arterial occlusion. However, since May, the patient has developed episodes of chest pain/tightness with radiation to the left arm. This has been getting progressively worse. He develops chest symptoms and shortness of breath with minimal exertion. He states these episodes would last 20-30 minutes at times. He states they have occurred just by him ambulating to the bathroom. He presented to his PCP with these complaints and it was felt he should have emergent cardiology evaluation. He was evaluated by Dr. [name] on 03/23/2015 and he felt the patient should have cardiac catheterization. This was done on 03/31/2015 and revealed evidence of CAD with left main involvement. During the procedure, the patient had atrial fibrillation when the wire crossed the valve.He was admitted following the procedure for further management.   Hospital Course:  The patient was admitted to [facility] on 03/31/2015. His A-fib was treated with Amiodarone and he converted to sinus rhythm. It was felt coronary artery bypass grafting would be indicated and TCTS was consulted. Dr. Tyrone Sage saw the patient and reviewed his films and agreed with the need for CABG. All risks, benefits and alternatives of surgery were explained in detail, and the patient agreed  to proceed. The patient had previously been on Plavix, and was allowed a washout period prior to proceeding with surgery.  The patient was taken to the operating room and underwent the above procedure.    The postoperative course was notable for nausea early on which was treated conservatively and improved with Reglan.  He had intermittent atrial fibrillation with elevated rates at times, and was treated with Amiodarone and Lopressor.  He did convert to sinus rhythm.  Cardiology has seen the patient and it is felt that he will not need  anticoagulation unless further arrhythmias occur.  He developed some bleeding from the lower sternal incision after ambulating that did not stop with direct pressure. 4 interrupted sutures were placed to the area at the bedside with good hemostasis.   The patient was transferred to stepdown on postop day 4.  He remains in sinus rhythm and blood pressures have been stable.  Incisions are healing well and he has had no further bleeding.  He is ambulating in the halls without difficulty and is tolerating a regular diet.  He has had a mild blood loss anemia which has not required transfusion. Creatinine did bump up to 1.36, but has trended back to baseline with conservative management. Blood sugars have been controlled with sliding scale insulin. He will need outpatient follow up for hemoglobin A1C of 6.2. Overall, the patient is progressing well and is medically stable for discharge home on today's date.      Recent vital signs:  Filed Vitals:   04/11/15 0936  BP: 113/53  Pulse: 84  Temp: 98.9 F (37.2 C)  Resp:     Recent laboratory studies:  CBC:  Recent Labs  04/10/15 0220 04/11/15 0430  WBC 16.3* 14.0*  HGB 9.6* 9.7*  HCT 28.4* 29.7*  PLT 225 294   BMET:   Recent Labs  04/10/15 0220  NA 138  K 4.1  CL 102  CO2 28  GLUCOSE 107*  BUN 28*  CREATININE 1.30*  CALCIUM 7.8*    PT/INR: No results for input(s): LABPROT, INR in the last 72 hours.   Discharge Medications:     Medication List    STOP taking these medications        clopidogrel 75 MG tablet  Commonly known as:  PLAVIX     metoprolol succinate 25 MG 24 hr tablet  Commonly known as:  TOPROL XL     naproxen sodium 220 MG tablet  Commonly known as:  ANAPROX     nitroGLYCERIN 0.4 MG SL tablet  Commonly known as:  NITROSTAT      TAKE these medications        acetaminophen 650 MG CR tablet  Commonly known as:  TYLENOL  Take 650 mg by mouth every 8 (eight) hours as needed.     amiodarone 200 MG  tablet  Commonly known as:  PACERONE  Take 1 tablet (200 mg total) by mouth daily.  Start taking on:  04/14/2015     aspirin 81 MG EC tablet  Take 1 tablet (81 mg total) by mouth daily.     cephALEXin 500 MG capsule  Commonly known as:  KEFLEX  Take 1 capsule (500 mg total) by mouth every 8 (eight) hours.     clobetasol ointment 0.05 %  Commonly known as:  TEMOVATE  Apply 1 application topically 2 (two) times daily.     fexofenadine 180 MG tablet  Commonly known as:  ALLEGRA  Take 180 mg by mouth  daily.     finasteride 5 MG tablet  Commonly known as:  PROSCAR  Take 1 tablet (5 mg total) by mouth daily.     furosemide 40 MG tablet  Commonly known as:  LASIX  Take 1 tablet (40 mg total) by mouth daily.     gabapentin 400 MG capsule  Commonly known as:  NEURONTIN  Take 1 capsule (400 mg total) by mouth 3 (three) times daily.     KETOCONAZOLE (TOPICAL) 1 % Sham  Apply topically.     metoprolol tartrate 25 MG tablet  Commonly known as:  LOPRESSOR  Take 1 tablet (25 mg total) by mouth 2 (two) times daily.     multivitamin tablet  Take 1 tablet by mouth daily.     oxyCODONE 5 MG immediate release tablet  Commonly known as:  Oxy IR/ROXICODONE  Take 1-2 tablets (5-10 mg total) by mouth every 3 (three) hours as needed for severe pain.     potassium chloride SA 20 MEQ tablet  Commonly known as:  K-DUR,KLOR-CON  Take 1 tablet (20 mEq total) by mouth daily.     simvastatin 20 MG tablet  Commonly known as:  ZOCOR  Take 1 tablet (20 mg total) by mouth daily.     testosterone cypionate 200 MG/ML injection  Commonly known as:  DEPOTESTOSTERONE CYPIONATE  Inject 1 mL (200 mg total) into the muscle every 14 (fourteen) days.     traMADol 50 MG tablet  Commonly known as:  ULTRAM  Take 1-2 tablets (50-100 mg total) by mouth every 4 (four) hours as needed for moderate pain.         Discharge Instructions:  The patient is to refrain from driving, heavy lifting or strenuous  activity.  May shower daily and clean incisions with soap and water.  May resume regular diet.   Follow Up: Follow-up Information    Follow up with [name], MD On 05/12/2015.   Specialty:  Cardiothoracic Surgery   Why:  Have a chest x-ray at [facility] at 9:00, then see MD at 10:00   Contact information:   [address] [place] [address] [phone]       Follow up with TCTS RN On 04/15/2015.   Why:  For suture removal at 10:30      Follow up with Marykay Lex, MD On 04/18/2015.   Specialty:  Cardiology   Why:  Dr [name] 10/3 @ 2pm [facility])    Contact information:   [address] [place] [address] [phone]       Follow up with [facility].   Why:  HH-PT arranged   Contact information:   [address] [place] [address] [phone]       Discharge Instructions    Amb Referral to Cardiac Rehabilitation    Complete by:  As directed   Congestive Heart Failure: If diagnosis is Heart Failure, patient MUST meet each of the CMS criteria: 1. Left Ventricular Ejection Fraction </= 35% 2. NYHA class II-IV symptoms despite being on optimal heart failure therapy for at least 6 weeks. 3. Stable = have not had a recent (<6 weeks) or planned (<6 months) major cardiovascular hospitalization or procedure  Program Details: - Physician supervised classes - 1-3 classes per week over a 12-18 week period, generally for a total of 36 sessions  Physician Certification: I certify that the above Cardiac Rehabilitation treatment is medically necessary and is  medically approved by me for treatment of this patient. The patient is willing and cooperative, able to ambulate and medically stable to participate in exercise rehabilitation. The participant's progress and Individualized Treatment Plan will be reviewed by the Medical Director, Cardiac Rehab staff and as indicated by the Referring/Ordering  Physician.  Diagnosis:  CABG          The patient has been discharged on:  1.Beta Blocker: Yes [ x ]  No [ ]   If No, reason:    2.Ace Inhibitor/ARB: Yes [ ]   No [ x ]  If No, reason: Postop acute kidney injury   3.Statin: Yes [ x ]  No [ ]   If No, reason:    4.Ecasa: Yes [ x ]  No [ ]   If No, reason:   Follow-up Information    Follow up with [name], MD On 05/12/2015.   Specialty:  Cardiothoracic Surgery   Why:  Have a chest x-ray at [facility] at 9:00, then see MD at 10:00   Contact information:   [address] [place] [address] [phone]       Follow up with [facility] RN On 04/15/2015.   Why:  For suture removal at 10:30      Follow up with Marykay Lex, MD On 04/18/2015.   Specialty:  Cardiology   Why:  Dr [name] 10/3 @ 2pm [facility])    Contact information:   [address] [place] [address] [phone]       Follow up with [facility].   Why:  HH-PT arranged   Contact information:   [address] [place] [address] [phone]        Lowella Dandy 04/12/2015, 6:52 PM

## 2015-04-08 NOTE — Progress Notes (Signed)
Patient ID: [name], male   DOB: [birth year], 69 y.o.   MRN: [id number] TCTS DAILY ICU PROGRESS NOTE                   [address]            [place] [address]          [phone]   4 Days Post-Op Procedure(s) (LRB): CORONARY ARTERY BYPASS GRAFTING (CABG) (N/A) TRANSESOPHAGEAL ECHOCARDIOGRAM (TEE) (N/A)  Total Length of Stay:  LOS: 8 days   Subjective: Feels better today, got some sleep last night  Objective: Vital signs in last 24 hours: Temp:  [98 F (36.7 C)-98.5 F (36.9 C)] 98.5 F (36.9 C) (09/23 0400) Pulse Rate:  [73-96] 96 (09/23 0700) Cardiac Rhythm:  [-] Normal sinus rhythm (09/23 0400) Resp:  [14-25] 23 (09/23 0700) BP: (89-162)/(47-79) 129/57 mmHg (09/23 0600) SpO2:  [95 %-100 %] 100 % (09/23 0700) Weight:  [212 lb 1.3 oz (96.2 kg)] 212 lb 1.3 oz (96.2 kg) (09/23 0500)  Filed Weights   04/06/15 0500 04/07/15 0400 04/08/15 0500  Weight: 215 lb 9.8 oz (97.8 kg) 214 lb 8.1 oz (97.3 kg) 212 lb 1.3 oz (96.2 kg)    Weight change: -2 lb 6.8 oz (-1.1 kg)   Hemodynamic parameters for last 24 hours:    Intake/Output from previous day: 09/22 0701 - 09/23 0700 In: 673.5 [P.O.:590; I.V.:83.5] Out: 1900 [Urine:1900]  Intake/Output this shift:    Current Meds: Scheduled Meds: . acetaminophen  1,000 mg Oral 4 times per day   Or  . acetaminophen (TYLENOL) oral liquid 160 mg/5 mL  1,000 mg Per Tube 4 times per day  . amiodarone  200 mg Oral Q12H   Followed by  . [START ON 04/14/2015] amiodarone  200 mg Oral Daily  . antiseptic oral rinse  7 mL Mouth Rinse BID  . aspirin EC  81 mg Oral Daily  . atorvastatin  80 mg Oral q1800  . bisacodyl  10 mg Oral Daily   Or  . bisacodyl  10 mg Rectal Daily  . docusate sodium  200 mg Oral Daily  . finasteride  5 mg Oral Daily  . furosemide  40 mg Oral Daily  . gabapentin  400 mg Oral TID  . insulin aspart  0-24 Units Subcutaneous TID AC & HS  . metoprolol tartrate  25 mg Oral BID  . pantoprazole   40 mg Oral QAC breakfast  . potassium chloride  20 mEq Oral Daily  . sodium chloride  3 mL Intravenous Q12H   Continuous Infusions:  PRN Meds:.sodium chloride, ALPRAZolam, bisacodyl **OR** bisacodyl, ondansetron **OR** ondansetron (ZOFRAN) IV, oxyCODONE, sodium chloride, sodium chloride, traMADol  General appearance: alert and cooperative Neurologic: intact Heart: regular rate and rhythm, S1, S2 normal, no murmur, click, rub or gallop Lungs: diminished breath sounds bibasilar Abdomen: soft, non-tender; bowel sounds normal; no masses,  no organomegaly Extremities: extremities normal, atraumatic, no cyanosis or edema and Homans sign is negative, no sign of DVT Wound: skin edges intact, small amt of bloody drainage on dressing, much improved from yesterday  Lab Results: CBC: Recent Labs  04/07/15 1413 04/08/15 0400  WBC 16.6* 12.6*  HGB 9.2* 8.8*  HCT 25.9* 26.0*  PLT 120* 135*   BMET:  Recent Labs  04/07/15 0403 04/08/15 0400  NA 133* 140  K 3.8 3.8  CL 100* 106  CO2 25 28  GLUCOSE 134* 129*  BUN 33* 32*  CREATININE 1.32*  1.20  CALCIUM 7.6* 7.8*    PT/INR: No results for input(s): LABPROT, INR in the last 72 hours. Radiology: No results found.   Assessment/Plan: S/P Procedure(s) (LRB): CORONARY ARTERY BYPASS GRAFTING (CABG) (N/A) TRANSESOPHAGEAL ECHOCARDIOGRAM (TEE) (N/A) Mobilize Diuresis Diabetes control Plan for transfer to step-down: see transfer orders Wbc decreasing Nausea improved Holding sinus plt ok , h/h stable Will hold plavix and lovenox for now      [name] 04/08/2015 7:30 AM

## 2015-04-08 NOTE — Progress Notes (Signed)
Physical Therapy Treatment Patient Details Name: [name] MRN: [id number] DOB: [birth year] Today's Date: 04/08/2015    History of Present Illness Pt is a 69 y/o male admitted s/p CABG x5 on 04/04/15.     PT Comments    Pt progressing towards physical therapy goals. Was able to progress gait training this session with no reports of increased pain, and no bleeding noted from incision site at end of session. Pt and wife were educated on bed mobility and general safety awareness for mobility at home. Will continue to follow and progress as able per POC.   Follow Up Recommendations  Home health PT;Supervision for mobility/OOB     Equipment Recommendations  Rolling walker with 5" wheels;3in1 (PT)    Recommendations for Other Services       Precautions / Restrictions Precautions Precautions: Fall;Sternal Precaution Comments: Reviewed sternal precautions. Required frequent cueing Restrictions Weight Bearing Restrictions: No Other Position/Activity Restrictions: Sternal precautions    Mobility  Bed Mobility Overal bed mobility: Needs Assistance Bed Mobility: Supine to Sit     Supine to sit: Mod assist     General bed mobility comments: Assist to elevate trunk to full sitting position. Pt and wife instructed in more of a log roll technique today.   Transfers Overall transfer level: Needs assistance Equipment used: Rolling walker (2 wheeled) Transfers: Sit to/from Stand Sit to Stand: Min guard         General transfer comment: Rocking at EOB to gain momentum. Was able to stand with hands-on guarding for support but no real physical assist.   Ambulation/Gait Ambulation/Gait assistance: Min guard;Supervision Ambulation Distance (Feet): 375 Feet Assistive device: Rolling walker (2 wheeled) Gait Pattern/deviations: Step-through pattern;Decreased stride length;Trunk flexed Gait velocity: Decreased Gait velocity interpretation: Below normal speed for age/gender General  Gait Details: Slow but steady gait. Pt was cued for quicker cadence and was able to maintain for a short time but not able to maintain due to fatigue. Supervision by end of gait training.    Stairs            Wheelchair Mobility    Modified Rankin (Stroke Patients Only)       Balance Overall balance assessment: Needs assistance Sitting-balance support: Feet supported;No upper extremity supported Sitting balance-Leahy Scale: Fair     Standing balance support: Bilateral upper extremity supported;During functional activity Standing balance-Leahy Scale: Poor Standing balance comment: Requires UE support to maintain standing balance.                     Cognition Arousal/Alertness: Awake/alert Behavior During Therapy: WFL for tasks assessed/performed Overall Cognitive Status: Within Functional Limits for tasks assessed                      Exercises      General Comments        Pertinent Vitals/Pain Pain Assessment: No/denies pain    Home Living                      Prior Function            PT Goals (current goals can now be found in the care plan section) Acute Rehab PT Goals Patient Stated Goal: Return home PT Goal Formulation: With patient Time For Goal Achievement: 04/14/15 Potential to Achieve Goals: Good Progress towards PT goals: Progressing toward goals    Frequency  Min 3X/week    PT Plan Current plan remains appropriate  Co-evaluation             End of Session Equipment Utilized During Treatment: Gait belt Activity Tolerance: Patient tolerated treatment well Patient left: in chair;with call bell/phone within reach;with nursing/sitter in room;with family/visitor present     Time: 1347-1420 PT Time Calculation (min) (ACUTE ONLY): 33 min  Charges:  $Gait Training: 8-22 mins $Therapeutic Activity: 8-22 mins                    G Codes:      Conni Slipper 2015-04-25, 3:05 PM   Conni Slipper, PT,  DPT Acute Rehabilitation Services Pager: [phone]

## 2015-04-09 LAB — CBC
HCT: 25.9 % — ABNORMAL LOW (ref 39.0–52.0)
Hemoglobin: 8.6 g/dL — ABNORMAL LOW (ref 13.0–17.0)
MCH: 32.7 pg (ref 26.0–34.0)
MCHC: 33.2 g/dL (ref 30.0–36.0)
MCV: 98.5 fL (ref 78.0–100.0)
Platelets: 186 10*3/uL (ref 150–400)
RBC: 2.63 MIL/uL — ABNORMAL LOW (ref 4.22–5.81)
RDW: 13.7 % (ref 11.5–15.5)
WBC: 13.2 10*3/uL — ABNORMAL HIGH (ref 4.0–10.5)

## 2015-04-09 LAB — BASIC METABOLIC PANEL
Anion gap: 8 (ref 5–15)
BUN: 31 mg/dL — ABNORMAL HIGH (ref 6–20)
CO2: 27 mmol/L (ref 22–32)
Calcium: 7.9 mg/dL — ABNORMAL LOW (ref 8.9–10.3)
Chloride: 103 mmol/L (ref 101–111)
Creatinine, Ser: 1.31 mg/dL — ABNORMAL HIGH (ref 0.61–1.24)
GFR calc Af Amer: >60 mL/min (ref >60)
GFR calc non Af Amer: 54 mL/min — ABNORMAL LOW (ref >60)
Glucose, Bld: 111 mg/dL — ABNORMAL HIGH (ref 65–99)
Potassium: 4 mmol/L (ref 3.5–5.1)
Sodium: 138 mmol/L (ref 135–145)

## 2015-04-09 LAB — GLUCOSE, CAPILLARY
Glucose-Capillary: 134 mg/dL — ABNORMAL HIGH (ref 65–99)
Glucose-Capillary: 128 mg/dL — ABNORMAL HIGH (ref 65–99)
Glucose-Capillary: 163 mg/dL — ABNORMAL HIGH (ref 65–99)
Glucose-Capillary: 151 mg/dL — ABNORMAL HIGH (ref 65–99)

## 2015-04-09 MED ORDER — FUROSEMIDE 10 MG/ML IJ SOLN
20.0000 mg | Freq: Once | INTRAMUSCULAR | Status: AC
  Administered 2015-04-09: 20 mg via INTRAVENOUS
  Filled 2015-04-09: qty 2

## 2015-04-09 MED ORDER — CEPHALEXIN 500 MG PO CAPS
500.0000 mg | ORAL_CAPSULE | Freq: Three times a day (TID) | ORAL | Status: DC
  Administered 2015-04-09 (×2): 500 mg via ORAL
  Filled 2015-04-09 (×6): qty 1

## 2015-04-09 NOTE — Progress Notes (Signed)
CT surgery p.m. Rounds   Patient examined and record reviewed.Hemodynamics stable,labs satisfactory.Patient had stable day.Continue current care. [name] 04/09/2015   

## 2015-04-09 NOTE — Progress Notes (Signed)
5 Days Post-Op Procedure(s) (LRB): CORONARY ARTERY BYPASS GRAFTING (CABG) (N/A) TRANSESOPHAGEAL ECHOCARDIOGRAM (TEE) (N/A) Subjective: nsr Scant serosanguinous drainage from lower sternal incision  Objective: Vital signs in last 24 hours: Temp:  [97.6 F (36.4 C)-98.8 F (37.1 C)] 97.6 F (36.4 C) (09/24 0819) Pulse Rate:  [73-93] 86 (09/24 0900) Cardiac Rhythm:  [-] Normal sinus rhythm (09/24 0800) Resp:  [15-39] 26 (09/24 0900) BP: (99-130)/(54-67) 113/57 mmHg (09/24 0924) SpO2:  [90 %-100 %] 97 % (09/24 0900) Weight:  [211 lb 13.8 oz (96.1 kg)] 211 lb 13.8 oz (96.1 kg) (09/24 0600)  Hemodynamic parameters for last 24 hours:  stable  Intake/Output from previous day: 09/23 0701 - 09/24 0700 In: 1610 [P.O.:1610] Out: 975 [Urine:975] Intake/Output this shift: Total I/O In: 360 [P.O.:360] Out: 150 [Urine:150]  Mild-mod edema--cont lasix  Lab Results:  Recent Labs  04/08/15 0400 04/09/15 0242  WBC 12.6* 13.2*  HGB 8.8* 8.6*  HCT 26.0* 25.9*  PLT 135* 186   BMET:  Recent Labs  04/08/15 0400 04/09/15 0242  NA 140 138  K 3.8 4.0  CL 106 103  CO2 28 27  GLUCOSE 129* 111*  BUN 32* 31*  CREATININE 1.20 1.31*  CALCIUM 7.8* 7.9*    PT/INR: No results for input(s): LABPROT, INR in the last 72 hours. ABG    Component Value Date/Time   PHART 7.428 04/06/2015 0036   HCO3 21.9 04/06/2015 0036   TCO2 23 04/06/2015 0036   ACIDBASEDEF 2.0 04/06/2015 0036   O2SAT 90.0 04/06/2015 0036   CBG (last 3)   Recent Labs  04/08/15 1556 04/08/15 2128 04/09/15 0817  GLUCAP 108* 154* 134*    Assessment/Plan: S/P Procedure(s) (LRB): CORONARY ARTERY BYPASS GRAFTING (CABG) (N/A) TRANSESOPHAGEAL ECHOCARDIOGRAM (TEE) (N/A) tx to 2 west Wbc 13k- start po keflex   LOS: 9 days    Kathlee Nations Trigt III 04/09/2015

## 2015-04-10 LAB — BASIC METABOLIC PANEL
Anion gap: 8 (ref 5–15)
BUN: 28 mg/dL — ABNORMAL HIGH (ref 6–20)
CO2: 28 mmol/L (ref 22–32)
Calcium: 7.8 mg/dL — ABNORMAL LOW (ref 8.9–10.3)
Chloride: 102 mmol/L (ref 101–111)
Creatinine, Ser: 1.3 mg/dL — ABNORMAL HIGH (ref 0.61–1.24)
GFR calc Af Amer: >60 mL/min (ref >60)
GFR calc non Af Amer: 54 mL/min — ABNORMAL LOW (ref >60)
Glucose, Bld: 107 mg/dL — ABNORMAL HIGH (ref 65–99)
Potassium: 4.1 mmol/L (ref 3.5–5.1)
Sodium: 138 mmol/L (ref 135–145)

## 2015-04-10 LAB — GLUCOSE, CAPILLARY
Glucose-Capillary: 125 mg/dL — ABNORMAL HIGH (ref 65–99)
Glucose-Capillary: 128 mg/dL — ABNORMAL HIGH (ref 65–99)
Glucose-Capillary: 135 mg/dL — ABNORMAL HIGH (ref 65–99)
Glucose-Capillary: 162 mg/dL — ABNORMAL HIGH (ref 65–99)

## 2015-04-10 LAB — CBC
HCT: 28.4 % — ABNORMAL LOW (ref 39.0–52.0)
Hemoglobin: 9.6 g/dL — ABNORMAL LOW (ref 13.0–17.0)
MCH: 33.1 pg (ref 26.0–34.0)
MCHC: 33.8 g/dL (ref 30.0–36.0)
MCV: 97.9 fL (ref 78.0–100.0)
Platelets: 225 10*3/uL (ref 150–400)
RBC: 2.9 MIL/uL — ABNORMAL LOW (ref 4.22–5.81)
RDW: 13.5 % (ref 11.5–15.5)
WBC: 16.3 10*3/uL — ABNORMAL HIGH (ref 4.0–10.5)

## 2015-04-10 MED ORDER — LIDOCAINE HCL (PF) 1 % IJ SOLN
3.0000 mL | Freq: Once | INTRAMUSCULAR | Status: AC
  Administered 2015-04-10: 3 mL via INTRADERMAL
  Filled 2015-04-10: qty 4

## 2015-04-10 MED ORDER — DIPHENOXYLATE-ATROPINE 2.5-0.025 MG PO TABS
1.0000 | ORAL_TABLET | Freq: Four times a day (QID) | ORAL | Status: DC | PRN
  Administered 2015-04-10: 1 via ORAL
  Filled 2015-04-10: qty 1

## 2015-04-10 MED ORDER — LOPERAMIDE HCL 1 MG/5ML PO LIQD
2.0000 mg | Freq: Two times a day (BID) | ORAL | Status: DC
  Administered 2015-04-10: 2 mg via ORAL
  Filled 2015-04-10 (×5): qty 10

## 2015-04-10 MED ORDER — LIDOCAINE HCL (PF) 1 % IJ SOLN: 5.0000 mL | Freq: Once | INTRAMUSCULAR | Status: AC

## 2015-04-10 MED ORDER — DOXYCYCLINE HYCLATE 100 MG PO TABS: 100.0000 mg | ORAL_TABLET | Freq: Two times a day (BID) | ORAL | Status: DC

## 2015-04-10 MED ORDER — LIDOCAINE HCL (PF) 1 % IJ SOLN
INTRAMUSCULAR | Status: AC
  Filled 2015-04-10: qty 5

## 2015-04-10 MED ORDER — VANCOMYCIN HCL IN DEXTROSE 1-5 GM/200ML-% IV SOLN
1000.0000 mg | Freq: Two times a day (BID) | INTRAVENOUS | Status: DC
  Administered 2015-04-10 (×2): 1000 mg via INTRAVENOUS
  Filled 2015-04-10 (×5): qty 200

## 2015-04-10 NOTE — Progress Notes (Signed)
6 Days Post-Op Procedure(s) (LRB): CORONARY ARTERY BYPASS GRAFTING (CABG) (N/A) TRANSESOPHAGEAL ECHOCARDIOGRAM (TEE) (N/A) Subjective: Lower sternal drainage minimal but persistent- more sutures placed Having diarrhea- stop keflex >>> vancomycin DC EPWs Still waiting for tele bed  Objective: Vital signs in last 24 hours: Temp:  [97.9 F (36.6 C)-98.6 F (37 C)] 98.6 F (37 C) (09/25 0805) Pulse Rate:  [72-89] 82 (09/25 0700) Cardiac Rhythm:  [-] Normal sinus rhythm (09/24 2000) Resp:  [17-26] 20 (09/25 0700) BP: (100-116)/(52-58) 109/57 mmHg (09/25 0400) SpO2:  [90 %-100 %] 99 % (09/25 0700) Weight:  [208 lb 12.4 oz (94.7 kg)] 208 lb 12.4 oz (94.7 kg) (09/25 0700)  Hemodynamic parameters for last 24 hours:  stable  Intake/Output from previous day: 09/24 0701 - 09/25 0700 In: 1440 [P.O.:1440] Out: 1075 [Urine:1075] Intake/Output this shift:    Lungs clear Sternum w/o cellulitis  Lab Results:  Recent Labs  04/09/15 0242 04/10/15 0220  WBC 13.2* 16.3*  HGB 8.6* 9.6*  HCT 25.9* 28.4*  PLT 186 225   BMET:  Recent Labs  04/09/15 0242 04/10/15 0220  NA 138 138  K 4.0 4.1  CL 103 102  CO2 27 28  GLUCOSE 111* 107*  BUN 31* 28*  CREATININE 1.31* 1.30*  CALCIUM 7.9* 7.8*    PT/INR: No results for input(s): LABPROT, INR in the last 72 hours. ABG    Component Value Date/Time   PHART 7.428 04/06/2015 0036   HCO3 21.9 04/06/2015 0036   TCO2 23 04/06/2015 0036   ACIDBASEDEF 2.0 04/06/2015 0036   O2SAT 90.0 04/06/2015 0036   CBG (last 3)   Recent Labs  04/09/15 1745 04/09/15 2156 04/10/15 0802  GLUCAP 163* 151* 125*    Assessment/Plan: S/P Procedure(s) (LRB): CORONARY ARTERY BYPASS GRAFTING (CABG) (N/A) TRANSESOPHAGEAL ECHOCARDIOGRAM (TEE) (N/A) Follow elevated WBC, wound culture Iv vanco ordered   LOS: 10 days    Kathlee Nations Trigt III 04/10/2015

## 2015-04-10 NOTE — Progress Notes (Signed)
Patient lying in bed, no distress or pain. Call light within reach. 

## 2015-04-11 LAB — CBC
HCT: 29.7 % — ABNORMAL LOW (ref 39.0–52.0)
Hemoglobin: 9.7 g/dL — ABNORMAL LOW (ref 13.0–17.0)
MCH: 32.3 pg (ref 26.0–34.0)
MCHC: 32.7 g/dL (ref 30.0–36.0)
MCV: 99 fL (ref 78.0–100.0)
Platelets: 294 10*3/uL (ref 150–400)
RBC: 3 MIL/uL — ABNORMAL LOW (ref 4.22–5.81)
RDW: 13.4 % (ref 11.5–15.5)
WBC: 14 10*3/uL — ABNORMAL HIGH (ref 4.0–10.5)

## 2015-04-11 LAB — GLUCOSE, CAPILLARY
Glucose-Capillary: 164 mg/dL — ABNORMAL HIGH (ref 65–99)
Glucose-Capillary: 159 mg/dL — ABNORMAL HIGH (ref 65–99)

## 2015-04-11 MED ORDER — POTASSIUM CHLORIDE CRYS ER 20 MEQ PO TBCR: 20.0000 meq | EXTENDED_RELEASE_TABLET | Freq: Every day | ORAL | Status: DC

## 2015-04-11 MED ORDER — CEPHALEXIN 500 MG PO CAPS: 500.0000 mg | ORAL_CAPSULE | Freq: Three times a day (TID) | ORAL | Status: DC

## 2015-04-11 MED ORDER — ATORVASTATIN CALCIUM 80 MG PO TABS: 80.0000 mg | ORAL_TABLET | Freq: Every day | ORAL | Status: DC

## 2015-04-11 MED ORDER — METOPROLOL TARTRATE 25 MG PO TABS: 25.0000 mg | ORAL_TABLET | Freq: Two times a day (BID) | ORAL | Status: DC

## 2015-04-11 MED ORDER — FUROSEMIDE 40 MG PO TABS: 40.0000 mg | ORAL_TABLET | Freq: Every day | ORAL | Status: DC

## 2015-04-11 MED ORDER — ASPIRIN 81 MG PO TBEC: 81.0000 mg | DELAYED_RELEASE_TABLET | Freq: Every day | ORAL | Status: DC

## 2015-04-11 MED ORDER — CEPHALEXIN 500 MG PO CAPS
500.0000 mg | ORAL_CAPSULE | Freq: Three times a day (TID) | ORAL | Status: DC
  Administered 2015-04-11 (×2): 500 mg via ORAL
  Filled 2015-04-11 (×3): qty 1

## 2015-04-11 MED ORDER — TRAMADOL HCL 50 MG PO TABS: 50.0000 mg | ORAL_TABLET | ORAL | Status: DC | PRN

## 2015-04-11 MED ORDER — OXYCODONE HCL 5 MG PO TABS: 5.0000 mg | ORAL_TABLET | ORAL | Status: DC | PRN

## 2015-04-11 MED ORDER — AMIODARONE HCL 200 MG PO TABS: 200.0000 mg | ORAL_TABLET | Freq: Every day | ORAL | Status: DC

## 2015-04-11 NOTE — Discharge Instructions (Signed)
Coronary Artery Bypass Grafting, Care After °Refer to this sheet in the next few weeks. These instructions provide you with information on caring for yourself after your procedure. Your health care provider may also give you more specific instructions. Your treatment has been planned according to current medical practices, but problems sometimes occur. Call your health care provider if you have any problems or questions after your procedure. °WHAT TO EXPECT AFTER THE PROCEDURE °Recovery from surgery will be different for everyone. Some people feel well after 3 or 4 weeks, while for others it takes longer. After your procedure, it is typical to have the following: °· Nausea and a lack of appetite.   °· Constipation. °· Weakness and fatigue.   °· Depression or irritability.   °· Pain or discomfort at your incision site. °HOME CARE INSTRUCTIONS °· Take medicines only as directed by your health care provider. Do not stop taking medicines or start any new medicines without first checking with your health care provider. °· Take your pulse as directed by your health care provider. °· Perform deep breathing as directed by your health care provider. If you were given a device called an incentive spirometer, use it to practice deep breathing several times a day. Support your chest with a pillow or your arms when you take deep breaths or cough. °· Keep incision areas clean, dry, and protected. Remove or change any bandages (dressings) only as directed by your health care provider. You may have skin adhesive strips over the incision areas. Do not take the strips off. They will fall off on their own. °· Check incision areas daily for any swelling, redness, or drainage. °· If incisions were made in your legs, do the following: °¨ Avoid crossing your legs.   °¨ Avoid sitting for long periods of time. Change positions every 30 minutes.   °¨ Elevate your legs when you are sitting. °· Wear compression stockings as directed by your  health care provider. These stockings help keep blood clots from forming in your legs. °· Take showers once your health care provider approves. Until then, only take sponge baths. Pat incisions dry. Do not rub incisions with a washcloth or towel. Do not take baths, swim, or use a hot tub until your health care provider approves. °· Eat foods that are high in fiber, such as raw fruits and vegetables, whole grains, beans, and nuts. Meats should be lean cut. Avoid canned, processed, and fried foods. °· Drink enough fluid to keep your urine clear or pale yellow. °· Weigh yourself every day. This helps identify if you are retaining fluid that may make your heart and lungs work harder. °· Rest and limit activity as directed by your health care provider. You may be instructed to: °¨ Stop any activity at once if you have chest pain, shortness of breath, irregular heartbeats, or dizziness. Get help right away if you have any of these symptoms. °¨ Move around frequently for short periods or take short walks as directed by your health care provider. Increase your activities gradually. You may need physical therapy or cardiac rehabilitation to help strengthen your muscles and build your endurance. °¨ Avoid lifting, pushing, or pulling anything heavier than 10 lb (4.5 kg) for at least 6 weeks after surgery. °· Do not drive until your health care provider approves.  °· Ask your health care provider when you may return to work. °· Ask your health care provider when you may resume sexual activity. °· Keep all follow-up visits as directed by your health care   provider. This is important. °SEEK MEDICAL CARE IF: °· You have swelling, redness, increasing pain, or drainage at the site of an incision. °· You have a fever. °· You have swelling in your ankles or legs. °· You have pain in your legs.   °· You gain 2 or more pounds (0.9 kg) a day. °· You are nauseous or vomit. °· You have diarrhea.  °SEEK IMMEDIATE MEDICAL CARE IF: °· You have  chest pain that goes to your jaw or arms. °· You have shortness of breath.   °· You have a fast or irregular heartbeat.   °· You notice a "clicking" in your breastbone (sternum) when you move.   °· You have numbness or weakness in your arms or legs. °· You feel dizzy or light-headed.   °MAKE SURE YOU: °· Understand these instructions. °· Will watch your condition. °· Will get help right away if you are not doing well or get worse. °Document Released: 01/19/2005 Document Revised: 11/16/2013 Document Reviewed: 12/09/2012 °ExitCare® Patient Information ©2015 [employer]. This information is not intended to replace advice given to you by your health care provider. Make sure you discuss any questions you have with your health care provider. ° °Endoscopic Saphenous Vein Harvesting °Care After °Refer to this sheet in the next few weeks. These instructions provide you with information on caring for yourself after your procedure. Your health care provider may also give you more specific instructions. Your treatment has been planned according to current medical practices, but problems sometimes occur. Call your health care provider if you have any problems or questions after your procedure. °HOME CARE INSTRUCTIONS °Medicine °· Take whatever pain medicine your surgeon prescribes. Follow the directions carefully. Do not take over-the-counter pain medicine unless your surgeon says it is okay. Some pain medicine can cause bleeding problems for several weeks after surgery. °· Follow your surgeon's instructions about driving. You will probably not be permitted to drive after heart surgery. °· Take any medicines your surgeon prescribes. Any medicines you took before your heart surgery should be checked with your health care provider before you start taking them again. °Wound care °· If your surgeon has prescribed an elastic bandage or stocking, ask how long you should wear it. °· Check the area around your surgical cuts  (incisions) whenever your bandages (dressings) are changed. Look for any redness or swelling. °· You will need to return to have the stitches (sutures) or staples taken out. Ask your surgeon when to do that. °· Ask your surgeon when you can shower or bathe. °Activity °· Try to keep your legs raised when you are sitting. °· Do any exercises your health care providers have given you. These may include deep breathing exercises, coughing, walking, or other exercises. °SEEK MEDICAL CARE IF: °· You have any questions about your medicines. °· You have more leg pain, especially if your pain medicine stops working. °· New or growing bruises develop on your leg. °· Your leg swells, feels tight, or becomes red. °· You have numbness in your leg. °SEEK IMMEDIATE MEDICAL CARE IF: °· Your pain gets much worse. °· Blood or fluid leaks from any of the incisions. °· Your incisions become warm, swollen, or red. °· You have chest pain. °· You have trouble breathing. °· You have a fever. °· You have more pain near your leg incision. °MAKE SURE YOU: °· Understand these instructions. °· Will watch your condition. °· Will get help right away if you are not doing well or get worse. °Document Released: 03/14/2011   Document Revised: 07/07/2013 Document Reviewed: 03/14/2011 °ExitCare® Patient Information ©2015 [employer]. This information is not intended to replace advice given to you by your health care provider. Make sure you discuss any questions you have with your health care provider. ° ° °

## 2015-04-11 NOTE — Progress Notes (Signed)
Pt ambulated independently with RN 500 ft. Pt only complaint is that he felt tired after his walk and some SOB.   Pt laying in bed call bell in reach.   Edgardo Roys

## 2015-04-11 NOTE — Progress Notes (Addendum)
Nursing note Patient given discharge instructions medication list and paper prescriptions patient agreeable to take Keflex, follow up appointments, pt verbalized understanding. All questions were answered will discharge home as ordered. Barbar Brede, Randall An RN

## 2015-04-11 NOTE — Care Management Note (Signed)
Case Management Note Donn Pierini RN, BSN Unit 2W-Case Manager [phone]  Patient Details  Name: Octavius Shin MRN: [id number] Date of Birth: 10-19-1945  Subjective/Objective:     Pt admitted with progressive angina- s/p CABG x5-                Action/Plan: PTA pt lived at home with spouse- per PT recommendation- order for HH-PT- spoke with pt at bedside regarding HH needs- list for [facility] agencies of [place] given to pt to review- per pt choice- he would like to use [facility] for [facility] services- referral given to Clydie Braun with [facility] for HH-PT- pt does not have any DME needs. Plan to return home with wife.  Expected Discharge Date:      04/11/15            Expected Discharge Plan:  Home w Home Health Services  In-House Referral:     Discharge planning Services  CM Consult  Post Acute Care Choice:  Home Health Choice offered to:  Patient  DME Arranged:  N/A DME Agency:  NA  HH Arranged:  PT HH Agency:  [facility]  Status of Service:  Completed, signed off  Medicare Important Message Given:  Yes-third notification given Date Medicare IM Given:    Medicare IM give by:    Date Additional Medicare IM Given:    Additional Medicare Important Message give by:     If discussed at Long Length of Stay Meetings, dates discussed:    Additional Comments:  Darrold Span, RN 04/11/2015, 11:04 AM

## 2015-04-11 NOTE — Care Management Important Message (Signed)
Important Message  Patient Details  Name: [name] MRN: [id number] Date of Birth: [birth year]   Medicare Important Message Given:  Yes-fourth notification given    Orson Aloe 04/11/2015, 2:15 PM

## 2015-04-11 NOTE — Progress Notes (Signed)
Physical Therapy Treatment Patient Details Name: Admiral Marcucci MRN: [id number] DOB: 05-26-1946 Today's Date: 04/11/2015    History of Present Illness Pt is a 69 y/o male admitted s/p CABG x5 on 04/04/15.     PT Comments    Pt able to ambulate at S level with no AD with 1/4 dyspnea rating and o2 91%.  Discussed d/c plans and do not feel pt needs any DME and CM informed.  Recommend HHPT for continued assessment of mobility in home environment and unlevel surfaces in order for pt to return to PLOF.  Follow Up Recommendations  Home health PT     Equipment Recommendations  None recommended by PT    Recommendations for Other Services       Precautions / Restrictions Precautions Precautions: Fall;Sternal Restrictions Weight Bearing Restrictions: Yes Other Position/Activity Restrictions: Sternal precautions    Mobility  Bed Mobility               General bed mobility comments: Pt up in room upon arrival.  Transfers Overall transfer level: Modified independent     Sit to Stand: Modified independent (Device/Increase time)         General transfer comment: Pt demonstrated ability to stand from recliner with no UE assist.  Ambulation/Gait Ambulation/Gait assistance: Supervision Ambulation Distance (Feet): 400 Feet Assistive device: None Gait Pattern/deviations: Step-through pattern   Gait velocity interpretation: Below normal speed for age/gender General Gait Details: Incorporated elements of the dynamic gait index and pt with no difficulty with head turns/nods or quick stops.  pt with 1/4 dyspnea with o2 sat 91% on room air.   Stairs            Wheelchair Mobility    Modified Rankin (Stroke Patients Only)       Balance     Sitting balance-Leahy Scale: Good       Standing balance-Leahy Scale: Good                      Cognition Arousal/Alertness: Awake/alert Behavior During Therapy: WFL for tasks assessed/performed Overall Cognitive  Status: Within Functional Limits for tasks assessed                      Exercises      General Comments General comments (skin integrity, edema, etc.): Nursing in to give meds and CM in to speak with pt at end of session.  Discussed no need for DME with CM.      Pertinent Vitals/Pain Pain Assessment: No/denies pain    Home Living                      Prior Function            PT Goals (current goals can now be found in the care plan section) Acute Rehab PT Goals Patient Stated Goal: Return home PT Goal Formulation: With patient Time For Goal Achievement: 04/14/15 Potential to Achieve Goals: Good Progress towards PT goals: Progressing toward goals    Frequency  Min 3X/week    PT Plan Current plan remains appropriate    Co-evaluation             End of Session Equipment Utilized During Treatment: Gait belt Activity Tolerance: Patient tolerated treatment well Patient left: in chair;with nursing/sitter in room     Time: 0954-1006 PT Time Calculation (min) (ACUTE ONLY): 12 min  Charges:  $Gait Training: 8-22 mins  G Codes:      SMITH,KAREN LUBECK 04/11/2015, 10:29 AM

## 2015-04-11 NOTE — Progress Notes (Signed)
0981-1914 Education completed with pt and wife who voiced understanding.  Encouraged IS. Discussed carb counting and heart healthy diets. Discussed CRP 2 and permission given to refer to [facility]. Has watched discharge video already. Luetta Nutting RN BSN 04/11/2015 1:36 PM

## 2015-04-11 NOTE — Progress Notes (Signed)
Patient sitting up in chair, no needs expressed at this time, no pain.  Call light within reach

## 2015-04-11 NOTE — Progress Notes (Addendum)
      [address]       [place] [address]             [phone]      7 Days Post-Op Procedure(s) (LRB): CORONARY ARTERY BYPASS GRAFTING (CABG) (N/A) TRANSESOPHAGEAL ECHOCARDIOGRAM (TEE) (N/A)   Subjective:  [name] has no complaints.  He states Dr. Tyrone Sage said he could go home this afternoon.  Objective: Vital signs in last 24 hours: Temp:  [97.9 F (36.6 C)-99.5 F (37.5 C)] 98.9 F (37.2 C) (09/26 0936) Pulse Rate:  [72-86] 84 (09/26 0936) Cardiac Rhythm:  [-] Normal sinus rhythm (09/26 0754) Resp:  [17-22] 18 (09/26 0503) BP: (104-135)/(49-62) 113/53 mmHg (09/26 0936) SpO2:  [91 %-98 %] 95 % (09/26 0936) Weight:  [209 lb 10.5 oz (95.1 kg)] 209 lb 10.5 oz (95.1 kg) (09/26 0200)  Intake/Output from previous day: 09/25 0701 - 09/26 0700 In: 200 [IV Piggyback:200] Out: 700 [Urine:700] Intake/Output this shift: Total I/O In: 240 [P.O.:240] Out: -   General appearance: alert, cooperative and no distress Heart: regular rate and rhythm Lungs: clear to auscultation bilaterally Abdomen: soft, non-tender; bowel sounds normal; no masses,  no organomegaly Extremities: edema trace Wound: clean and dry  Lab Results:  Recent Labs  04/10/15 0220 04/11/15 0430  WBC 16.3* 14.0*  HGB 9.6* 9.7*  HCT 28.4* 29.7*  PLT 225 294   BMET:  Recent Labs  04/09/15 0242 04/10/15 0220  NA 138 138  K 4.0 4.1  CL 103 102  CO2 27 28  GLUCOSE 111* 107*  BUN 31* 28*  CREATININE 1.31* 1.30*  CALCIUM 7.9* 7.8*    PT/INR: No results for input(s): LABPROT, INR in the last 72 hours. ABG    Component Value Date/Time   PHART 7.428 04/06/2015 0036   HCO3 21.9 04/06/2015 0036   TCO2 23 04/06/2015 0036   ACIDBASEDEF 2.0 04/06/2015 0036   O2SAT 90.0 04/06/2015 0036   CBG (last 3)   Recent Labs  04/10/15 1702 04/10/15 2037 04/11/15 0623  GLUCAP 135* 162* 164*    Assessment/Plan: S/P Procedure(s) (LRB): CORONARY ARTERY BYPASS GRAFTING (CABG)  (N/A) TRANSESOPHAGEAL ECHOCARDIOGRAM (TEE) (N/A)  1. CV-hemodynamically stable, continue Amiodarone, Lopressor 2. Pulm- no acute issues, continue IS 3. Renal- creatinine stable, remains hypervolemic- continue Lasix 4. Deconditioning- will arrange H/H 5. Dispo- patient is stable, per patient likely home this afternoon   LOS: 11 days    BARRETT, ERIN 04/11/2015  Wbc decreasing, no fever, wound intact without drainage. Patient wants to go home and feels well. Will d/c home today, resume plavix which was for "aorta" at later time. Follow up in office later this week for wound check and d/c sutures. I have seen and examined [name] and agree with the above assessment  and plan.  [name] MD Beeper [phone] Office [phone] 04/11/2015 11:31 AM

## 2015-04-12 ENCOUNTER — Telehealth: Payer: Self-pay

## 2015-04-12 DIAGNOSIS — I10 Essential (primary) hypertension: Secondary | ICD-10-CM | POA: Diagnosis not present

## 2015-04-12 DIAGNOSIS — F329 Major depressive disorder, single episode, unspecified: Secondary | ICD-10-CM | POA: Diagnosis not present

## 2015-04-12 DIAGNOSIS — N400 Enlarged prostate without lower urinary tract symptoms: Secondary | ICD-10-CM | POA: Diagnosis not present

## 2015-04-12 DIAGNOSIS — Z87891 Personal history of nicotine dependence: Secondary | ICD-10-CM | POA: Diagnosis not present

## 2015-04-12 DIAGNOSIS — I4891 Unspecified atrial fibrillation: Secondary | ICD-10-CM | POA: Diagnosis not present

## 2015-04-12 DIAGNOSIS — Z48812 Encounter for surgical aftercare following surgery on the circulatory system: Secondary | ICD-10-CM | POA: Diagnosis not present

## 2015-04-12 DIAGNOSIS — E785 Hyperlipidemia, unspecified: Secondary | ICD-10-CM | POA: Diagnosis not present

## 2015-04-12 DIAGNOSIS — Z951 Presence of aortocoronary bypass graft: Secondary | ICD-10-CM | POA: Diagnosis not present

## 2015-04-12 DIAGNOSIS — I739 Peripheral vascular disease, unspecified: Secondary | ICD-10-CM | POA: Diagnosis not present

## 2015-04-12 DIAGNOSIS — I2510 Atherosclerotic heart disease of native coronary artery without angina pectoris: Secondary | ICD-10-CM | POA: Diagnosis not present

## 2015-04-12 NOTE — Telephone Encounter (Signed)
Transition Care Management Follow-up Telephone Call   Date discharged? 04/11/15   How have you been since you were released from the hospital? Feeling pretty good. Sleeping very well. Normal routine.   Do you understand why you were in the hospital? Yes   Do you understand the discharge instructions? Yes, wife was with me at the hospital and is with me at home to help with what I may need.   Where were you discharged to? Home   Items Reviewed:  Medications reviewed: Yes  Allergies reviewed: Yes  Dietary changes reviewed: Yes  Referrals reviewed: Yes   Functional Questionnaire:   Activities of Daily Living (ADLs):   He states they are independent in the following:Ambulating, self feeding, grooming, toileting.  States they require assistance with the following: Dressing, shower, meal preparation. Wife currently assists. Home health to assist x3 days per week.   Any transportation issues/concerns?: No, wife to bring.   Any patient concerns? Not at this time. Patient stses, "I understand I will be a little sore and SOB for some period".   Confirmed importance and date/time of follow-up visits scheduled Yes appointment made for 04/20/15.  Patient to arrive at 1:00.  Provider Appointment booked with Dr. Dan Humphreys (PCP).  Confirmed with patient if condition begins to worsen call PCP or go to the ER.  Patient was given the office number and encouraged to call back with question or concerns.  : Yes, patient verbalized understanding.

## 2015-04-13 ENCOUNTER — Telehealth: Payer: Self-pay | Admitting: Internal Medicine

## 2015-04-13 DIAGNOSIS — Z48812 Encounter for surgical aftercare following surgery on the circulatory system: Secondary | ICD-10-CM | POA: Diagnosis not present

## 2015-04-13 DIAGNOSIS — Z951 Presence of aortocoronary bypass graft: Secondary | ICD-10-CM | POA: Diagnosis not present

## 2015-04-13 DIAGNOSIS — I4891 Unspecified atrial fibrillation: Secondary | ICD-10-CM | POA: Diagnosis not present

## 2015-04-13 DIAGNOSIS — I739 Peripheral vascular disease, unspecified: Secondary | ICD-10-CM | POA: Diagnosis not present

## 2015-04-13 DIAGNOSIS — I2510 Atherosclerotic heart disease of native coronary artery without angina pectoris: Secondary | ICD-10-CM | POA: Diagnosis not present

## 2015-04-13 DIAGNOSIS — I10 Essential (primary) hypertension: Secondary | ICD-10-CM | POA: Diagnosis not present

## 2015-04-13 NOTE — Telephone Encounter (Signed)
Name: [name] Gender: Male DOB: [birth year] Age: 69 Y 9 M 11 D Return Phone Number: [phone] (Primary) Address: City/State/Zip: [place] Client [facility] Night - Client Client Site [facility] Physician Carlus Pavlov Contact Type Call Caller Name Iori Gigante Caller Phone Number [phone] Relationship To Patient Self Is this call to report lab results? No Call Type General Information Initial Comment Caller states that he has an appt on Friday at 04-15-15 at 1:15; he was discharged from the hospital for open heart surgery and bipass and needs to cancel the appointment. Please call back.    I cancelled the appointment.

## 2015-04-14 DIAGNOSIS — I739 Peripheral vascular disease, unspecified: Secondary | ICD-10-CM | POA: Diagnosis not present

## 2015-04-14 DIAGNOSIS — Z48812 Encounter for surgical aftercare following surgery on the circulatory system: Secondary | ICD-10-CM | POA: Diagnosis not present

## 2015-04-14 DIAGNOSIS — I2510 Atherosclerotic heart disease of native coronary artery without angina pectoris: Secondary | ICD-10-CM | POA: Diagnosis not present

## 2015-04-14 DIAGNOSIS — I10 Essential (primary) hypertension: Secondary | ICD-10-CM | POA: Diagnosis not present

## 2015-04-14 DIAGNOSIS — I4891 Unspecified atrial fibrillation: Secondary | ICD-10-CM | POA: Diagnosis not present

## 2015-04-14 DIAGNOSIS — Z951 Presence of aortocoronary bypass graft: Secondary | ICD-10-CM | POA: Diagnosis not present

## 2015-04-14 LAB — WOUND CULTURE
Culture: NO GROWTH
Gram Stain: NONE SEEN

## 2015-04-15 ENCOUNTER — Ambulatory Visit: Payer: Medicare Other | Admitting: Internal Medicine

## 2015-04-15 ENCOUNTER — Encounter (INDEPENDENT_AMBULATORY_CARE_PROVIDER_SITE_OTHER): Payer: Self-pay

## 2015-04-15 DIAGNOSIS — I10 Essential (primary) hypertension: Secondary | ICD-10-CM | POA: Diagnosis not present

## 2015-04-15 DIAGNOSIS — I4891 Unspecified atrial fibrillation: Secondary | ICD-10-CM | POA: Diagnosis not present

## 2015-04-15 DIAGNOSIS — I2510 Atherosclerotic heart disease of native coronary artery without angina pectoris: Secondary | ICD-10-CM | POA: Diagnosis not present

## 2015-04-15 DIAGNOSIS — I739 Peripheral vascular disease, unspecified: Secondary | ICD-10-CM | POA: Diagnosis not present

## 2015-04-15 DIAGNOSIS — Z951 Presence of aortocoronary bypass graft: Secondary | ICD-10-CM | POA: Diagnosis not present

## 2015-04-15 DIAGNOSIS — Z48812 Encounter for surgical aftercare following surgery on the circulatory system: Secondary | ICD-10-CM | POA: Diagnosis not present

## 2015-04-18 ENCOUNTER — Encounter: Payer: Self-pay | Admitting: Cardiology

## 2015-04-18 ENCOUNTER — Ambulatory Visit (INDEPENDENT_AMBULATORY_CARE_PROVIDER_SITE_OTHER): Payer: Medicare Other | Admitting: Cardiology

## 2015-04-18 VITALS — BP 114/60 | HR 65 | Ht 69.0 in | Wt 202.2 lb

## 2015-04-18 DIAGNOSIS — I25119 Atherosclerotic heart disease of native coronary artery with unspecified angina pectoris: Secondary | ICD-10-CM | POA: Diagnosis not present

## 2015-04-18 DIAGNOSIS — Z48812 Encounter for surgical aftercare following surgery on the circulatory system: Secondary | ICD-10-CM | POA: Diagnosis not present

## 2015-04-18 DIAGNOSIS — I10 Essential (primary) hypertension: Secondary | ICD-10-CM | POA: Diagnosis not present

## 2015-04-18 DIAGNOSIS — I480 Paroxysmal atrial fibrillation: Secondary | ICD-10-CM

## 2015-04-18 DIAGNOSIS — I739 Peripheral vascular disease, unspecified: Secondary | ICD-10-CM

## 2015-04-18 DIAGNOSIS — I2510 Atherosclerotic heart disease of native coronary artery without angina pectoris: Secondary | ICD-10-CM

## 2015-04-18 DIAGNOSIS — I200 Unstable angina: Secondary | ICD-10-CM

## 2015-04-18 DIAGNOSIS — E785 Hyperlipidemia, unspecified: Secondary | ICD-10-CM

## 2015-04-18 DIAGNOSIS — F4322 Adjustment disorder with anxiety: Secondary | ICD-10-CM

## 2015-04-18 DIAGNOSIS — R600 Localized edema: Secondary | ICD-10-CM

## 2015-04-18 DIAGNOSIS — I4891 Unspecified atrial fibrillation: Secondary | ICD-10-CM | POA: Diagnosis not present

## 2015-04-18 DIAGNOSIS — Z951 Presence of aortocoronary bypass graft: Secondary | ICD-10-CM | POA: Diagnosis not present

## 2015-04-18 MED ORDER — FUROSEMIDE 40 MG PO TABS: 40.0000 mg | ORAL_TABLET | Freq: Every day | ORAL | Status: DC | PRN

## 2015-04-18 MED ORDER — LORAZEPAM 0.5 MG PO TABS: 0.5000 mg | ORAL_TABLET | Freq: Two times a day (BID) | ORAL | Status: DC | PRN

## 2015-04-18 NOTE — Telephone Encounter (Signed)
Patient had open heart surgery 2 weeks ago and does not want to have this done at this time.  Thanks, [name]

## 2015-04-18 NOTE — Patient Instructions (Signed)
MAY USE LASIX ( FUROSEMIDE 40 MG)  1/2 TABLET UNTIL SWELLING GOES DOWN THEN USE AS NEEDED.    ATIVAN .5 M.D.  MAY USE 1 -2 TABLETS AS NEEDED FOR INSOMNIA, ANIXETY    Your physician recommends that you schedule a follow-up appointment in 2 MONTHS WITH DR [name] IN [place].

## 2015-04-18 NOTE — Progress Notes (Signed)
PCP: [name], MD  Clinic Note: Chief Complaint  Patient presents with  . Follow-up    POST CABG...NO CHEST PAIN/ PRESSURE  . Coronary Artery Disease    HPI: [name] is a 69 y.o. male with a PMH below who presents today for Post-Hosptial (CABG) f/u.  03/31/2015:  1. Severe multivessel disease with heavy calcification of the proximal left and right coronary arteries. 2. LM lesion, 65% stenosed. 3. Mid LAD-1 lesion, 70% stenosed. Mid LAD-2 lesion, 90% stenosed prior to D1. Distmid LAD lesion, 80% stenosed. 4. Ost Cx to Prox Cx lesion, 75% stenosed. Mid Cx lesion, 95% stenosed. Dist Cx lesion, 70% stenosed. 1st Mrg lesion, 60% stenosed wtih 80% in smaller OM1a branch 5. Mid RCA lesion, 90% stenosed. Mid RCA to Dist RCA lesion, 100% stenosed - CTO. There is R-R collateralization from the RV Marginal branch to RPDA faintly filling the RPDA & small RPL. 6. The left ventricular systolic function is normal. 7. New onset Diagnosis of Afib - initially with RVR  04/04/2015: CORONARY ARTERY BYPASS GRAFTING X 5 (LIMA to LAD, SVG to DIAG, SVG to OM, seqSVG -PDA- dCx)   Kyre Jeffries was last seen 03/23/2015 --> scheduled cath.  Cath 9/15 -> admitted for CT Sgx consult b/c LM CAD & Afib.  Recent Hospitalizations: Admitted following his cardiac catheterization -- see above  Additional Studies Reviewed:   Echo 03/31/15: LVEF 55-60%, mild LVH, normal biatrial size, trivial TR, RVSP 30 mmHg.  Interval History: Rocko presents here also 1 week postop. He is still quite weak and tender. He is noticing some dyspnea exertion, no further anginal type chest pains. He doesn't really note much chest wall pain. Just discomfort with certain motions. He feels a fluttering sensation occasionally in his chest there is a sort of a sloshing effect like his heart is moving around and not stable and his chest. These are very fleeting. He gets a bit short of breath to walk around, but is notably  deconditioned. No shortness of breath rest. No PND, orthopnea. Still has some edema, and was due to stop his Lasix today.  No  lightheadedness, dizziness, weakness or syncope/near syncope. No TIA/amaurosis fugax symptoms. No melena, hematochezia, hematuria, or epstaxis. Not walk around and up to notice claudication.  ROS: A comprehensive was performed. Review of Systems  Respiratory: Negative for cough.   Cardiovascular: Positive for leg swelling.  Musculoskeletal:       Expected chest wall pain  Neurological: Positive for dizziness (Somewhat positional), weakness (Generalized) and headaches.  Endo/Heme/Allergies: Does not bruise/bleed easily.  Psychiatric/Behavioral: The patient is nervous/anxious and has insomnia.        Still emotionally dealing with his diagnosis - and recent CABG; not really sleeping well. Asked about having sleep aid.  All other systems reviewed and are negative.    Past Medical History  Diagnosis Date  . Peripheral vascular disease (HCC)     Bilateral ABIs 0.8  . Chronic distal aortic occlusion (HCC)     By report  . Diabetes mellitus     By his report, this is prediabetes   . SOB (shortness of breath)   . Hyperlipidemia LDL goal <70   . Essential hypertension   . Weakness   . Erectile dysfunction   . Hypogonadism in male     Previously on testosterone injections, but has not yet restarted until further testing  . Enlarged prostate   . Arthritis   . Depression   . History of kidney stones   .  Muscle pain   . CAD, multiple vessel 03/2105    LM 65%, mLAD 70%, 90% & 80%, o-pCx 75%, mCx 95% & d Cx 70%, OM1 60%, mRCA 90% with 100% dRCA. R-R collaterals  . S/P CABG x 5 04/04/2015    LIMA to LAD, SVG to DIAG, SVG to OM, seqSVG -PDA- dCx  . PAF (paroxysmal atrial fibrillation) (HCC) 03/2015    Noted per-cath & post-op CABG.    Past Surgical History  Procedure Laterality Date  . Cardiac catheterization  1992  . Ventral hernia repair  1994     complicated by infection  . Cholecystectomy  2000    also had revision of hernia repair  . Cystectomy      face and scrotum  . Blepharoplasty    . Lexiscan myoview  June 2010    converted to [facility] from treadmill due to inability to reach target heart rate) --the study was normal with no evidence of ischemia and normal LV function. EF 7  . Cardiac catheterization N/A 03/31/2015    Procedure: Left Heart Cath and Coronary Angiography;  Surgeon: Marykay Lex, MD;  Location: [facility] INVASIVE CV LAB;  Service: Cardiovascular;  Laterality: N/A;  . Coronary artery bypass graft N/A 04/04/2015    Procedure: CORONARY ARTERY BYPASS GRAFTING (CABG);  Surgeon: [name], MD;  Location: [facility] OR;  Service: Open Heart Surgery;  Laterality: N/A;  . Tee without cardioversion N/A 04/04/2015    Procedure: TRANSESOPHAGEAL ECHOCARDIOGRAM (TEE);  Surgeon: [name], MD;  Location: [facility] OR;  Service: Open Heart Surgery;  Laterality: N/A;   Prior to Admission medications   Medication Sig Start Date End Date Taking? Authorizing Provider  acetaminophen (TYLENOL) 650 MG CR tablet Take 650 mg by mouth every 8 (eight) hours as needed.   Yes Historical Provider, MD  amiodarone (PACERONE) 200 MG tablet Take 1 tablet (200 mg total) by mouth daily. 04/14/15  Yes Erin R Barrett, PA-C  aspirin EC 81 MG EC tablet Take 1 tablet (81 mg total) by mouth daily. 04/11/15  Yes Erin R Barrett, PA-C  cephALEXin (KEFLEX) 500 MG capsule Take 1 capsule (500 mg total) by mouth every 8 (eight) hours. 04/11/15  Yes Erin R Barrett, PA-C  fexofenadine (ALLEGRA) 180 MG tablet Take 180 mg by mouth daily.   Yes Historical Provider, MD  finasteride (PROSCAR) 5 MG tablet Take 1 tablet (5 mg total) by mouth daily. 12/17/14  Yes Shelia Media, MD  gabapentin (NEURONTIN) 400 MG capsule Take 1 capsule (400 mg total) by mouth 3 (three) times daily. 12/17/14  Yes Shelia Media, MD  metoprolol tartrate (LOPRESSOR) 25 MG tablet Take 1 tablet (25 mg  total) by mouth 2 (two) times daily. 04/11/15  Yes Erin R Barrett, PA-C  Multiple Vitamin (MULTIVITAMIN) tablet Take 1 tablet by mouth daily.   Yes Historical Provider, MD  NITROSTAT 0.4 MG SL tablet Place 0.4 mg under the tongue every 5 (five) minutes x 3 doses as needed. (CHEST PAIN) 03/28/15  Yes Historical Provider, MD  oxyCODONE (OXY IR/ROXICODONE) 5 MG immediate release tablet Take 1-2 tablets (5-10 mg total) by mouth every 3 (three) hours as needed for severe pain. 04/11/15  Yes Erin R Barrett, PA-C  simvastatin (ZOCOR) 20 MG tablet Take 1 tablet (20 mg total) by mouth daily. 12/17/14  Yes Shelia Media, MD  traMADol (ULTRAM) 50 MG tablet Take 1-2 tablets (50-100 mg total) by mouth every 4 (four) hours as needed for moderate pain.  04/11/15  Yes Erin R Barrett, PA-C   No Known Allergies   Social History   Social History  . Marital Status: Married    Spouse Name: N/A  . Number of Children: N/A  . Years of Education: N/A   Social History Main Topics  . Smoking status: Former Smoker -- 2.00 packs/day for 40 years    Types: Cigarettes    Quit date: 10/17/1992  . Smokeless tobacco: Never Used     Comment: quit 1994  . Alcohol Use: 0.0 oz/week    0 Standard drinks or equivalent per week     Comment: seldom  . Drug Use: No  . Sexual Activity: Not Asked   Other Topics Concern  . None   Social History Narrative   Lives in [place] with wife. No pets   Two children.      Former smoker. Quit in 1994. Prior to quitting was unable to walk without exertional dyspnea.      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      Hobbies - reading, drives Corvette, chuch   Family History  Problem Relation Age of Onset  . Coronary artery disease Father   . Heart disease Father   . Stroke Mother   . Stroke Sister   . Prostate cancer Neg Hx   . Kidney disease Neg Hx     Wt Readings from Last 3 Encounters:  04/20/15  203 lb 4 oz (92.194 kg)  04/18/15 202 lb 3.2 oz (91.717 kg)  04/11/15 209 lb 10.5 oz (95.1 kg)    PHYSICAL EXAM BP 114/60 mmHg  Pulse 65  Ht  (1.753 m)  Wt 202 lb 3.2 oz (91.717 kg)  BMI 29.85 kg/m2 General appearance: alert, cooperative, appears stated age, no distress and borderline obese - notable weight loss since preop HEENT: [place]/AT, EOMI, MMM, anicteric sclera - mild left eye ptosis (had a history of blepharoplasty) Neck: no adenopathy, no carotid bruit, no JVD and supple, symmetrical, trachea midline Lungs: clear to auscultation bilaterally, normal percussion bilaterally and Nonlabored, good air movement Heart: RRR with normal S1 and S2. Occasional ectopy. No M/R/G.; expected chest wall tenderness Abdomen: soft, non-tender; bowel sounds normal; no masses, no organomegaly and Truncal obesity Extremities: edema 1+ pitting, no ulcers, gangrene or trophic changes, venous stasis dermatitis noted and Distal rubor without calor. Faint pedal pulses - barely palpable Pulses: 1+ bilateral radial with a barely palpable DP/PT, 2+ femoral with + bruit Skin: Distal foot/leg rubor with mild stasis changes. No evidence of cellulitis Neurologic: Mental status: Alert, oriented, thought content appropriate, affect: normal Cranial nerves: Grossly normal    Adult ECG Report  Rate: 65 ;  Rhythm: normal sinus rhythm and Nonspecific ST and T wave abnormalities. Normal axis, intervals and durations.  Narrative Interpretation: no significant change from previous EKG   Other studies Reviewed: Additional studies/ records that were reviewed today include:  Recent Labs:   Hospital labs reviewed.   ASSESSMENT / PLAN: Problem List Items Addressed This Visit    S/P CABG x 5   Relevant Orders   EKG 12-Lead (Completed)   PAF (paroxysmal atrial fibrillation) (HCC)    Sinus rhythm today. On amiodarone. Would continue amiodarone: Beta blocker for now. Not currently on anticoagulation beyond aspirin. I  would like to see if there is recurrence before we consider full and granulation. Would likely stop amiodarone after a few months.  Relevant Medications   NITROSTAT 0.4 MG SL tablet   Other Relevant Orders   EKG 12-Lead (Completed)   PAD (peripheral artery disease) (HCC) (Chronic)   Relevant Medications   NITROSTAT 0.4 MG SL tablet   Other Relevant Orders   EKG 12-Lead (Completed)   RESOLVED: Left main coronary artery disease   Relevant Medications   NITROSTAT 0.4 MG SL tablet   Other Relevant Orders   EKG 12-Lead (Completed)   Essential hypertension (Chronic)   Relevant Medications   NITROSTAT 0.4 MG SL tablet   Other Relevant Orders   EKG 12-Lead (Completed)   Dyslipidemia, goal LDL below 70 (Chronic)    On statin. Not sure what his last labs were. Would consider possibly increasing to higher potency statin such as atorvastatin      Relevant Medications   NITROSTAT 0.4 MG SL tablet   Coronary artery disease involving native coronary artery with angina pectoris (HCC) - Primary (Chronic)    No recurrent angina since his cath/CABG. Having some some difficulties emotionally from an anxiety standpoint doing with having been through CABG. Having difficulty with sleeping and is otherwise anxious.  I prescribed some Ativan that he can use for anxiety or insomnia, but recommended he talk with his primary physician as well.      Relevant Medications   NITROSTAT 0.4 MG SL tablet   Other Relevant Orders   EKG 12-Lead (Completed)   CAD, multiple vessel    Referred for CABG x 5 following cardiac catheterization for exertional angina. Roughly one week following this charge. Doing relatively well. On beta blocker, aspirin and statin - consider switching to atorvastatin  Waiting for the cardiac surgery clearance for cardiac rehabilitation.      Relevant Medications   NITROSTAT 0.4 MG SL tablet   Other Relevant Orders   EKG 12-Lead (Completed)   Bilateral lower extremity edema      Still noted volume overloaded from his hospital stay. Will continue Lasix but decrease to 20 mg daily. Once his swelling has resolved, he can then use Lasix when necessary      Adjustment disorder with anxious mood    None expected anxiety over his health issues. At provided some Ativan. Deferred to primary physician for further management. Consider SSRI or SNRI.         Current medicines are reviewed at length with the patient today. (+/- concerns) none The following changes have been made:  MAY USE LASIX ( FUROSEMIDE 40 MG)  1/2 TABLET UNTIL SWELLING GOES DOWN THEN USE AS NEEDED.  ATIVAN .5 M.D.  MAY USE 1 -2 TABLETS AS NEEDED FOR INSOMNIA, ANIXETY  ROV -- 2 MONTHS WITH DR [name] IN [place].  Studies Ordered:   Orders Placed This Encounter  Procedures  . EKG 12-Lead      Marykay Lex, M.D., M.S. Interventional Cardiologist   Pager # [phone]

## 2015-04-19 NOTE — Op Note (Signed)
[name], [name] NO.:  [id number]  MEDICAL RECORD NO.:  [id number]  LOCATION:                                 FACILITY:  PHYSICIAN:  Sheliah Plane, MD    DATE OF BIRTH:  1945/09/24 DATE OF DISCHARGE:  04/11/2015 OPERATIVE REPORT PREOPERATIVE DIAGNOSIS:  Coronary occlusive disease with unstable angina. POSTOPERATIVE DIAGNOSIS:  Coronary occlusive disease with unstable angina. SURGICAL PROCEDURE:  Coronary artery bypass grafting x5 with the left internal mammary to the left anterior descending coronary artery, reverse saphenous vein graft to the diagonal coronary artery, reverse saphenous vein graft to intramyocardial obtuse marginal, and sequential reverse saphenous vein graft to the posterior descending and distal circumflex with right greater saphenous endovein harvesting thigh and calf, and left thigh greater saphenous vein endoscopic harvest.  SURGEON:  Sheliah Plane, MD  FIRST ASSISTANT:  Pauline Good, PA  BRIEF HISTORY:  The patient is a 69 year old male, who presented with new onset of anginal symptoms.  He underwent cardiac catheterization by Dr. [name].  During the catheterization, he developed an atrial fibrillation, was admitted to the hospital, this converted within 24 hours on amiodarone and remained in sinus rhythm.  The patient previously had been on Plavix for known peripheral vascular disease, including occlusion of his distal aorta.  Because of his symptoms and significant three-vessel coronary artery disease, coronary artery bypass grafting was recommended to the patient who agreed and signed informed consent.  At the time of cardiac catheterization, he was noted to have a totally occluded right coronary artery, a 70% LAD, 80%, diagonal with circumflex system had dominant vessel with proximal 80% stenosis and disease in the obtuse marginal branches.  DESCRIPTION OF PROCEDURE:  With Swan-Ganz and arterial line monitors in place,  the patient underwent general endotracheal anesthesia without incident.  Skin of chest and legs were prepped with Betadine and draped in usual sterile manner.  Using the [employer] endovein harvesting system, vein was harvested from the right thigh and calf.  The thigh portion of the vein was suitable, however the more distal vein was small. Additional segment of vein was harvested from the left thigh.  Median sternotomy was performed.  The left internal mammary artery was dissected down as a pedicle graft.  The distal artery was divided, had good free flow.  The pericardium was opened.  Overall, ventricular function appeared preserved.  The patient was systemically heparinized. Ascending aorta was cannulated.  The right atrium was cannulated.  An aortic root vent cardioplegia needle was introduced into the ascending aorta.  The patient was placed on cardiopulmonary bypass 2.4 L/min/m2. Sites of anastomosis were dissected out of the epicardium.  The patient's body temperature was cooled to 32 degrees.  Aortic crossclamp was applied and 500 mL of cold blood cardioplegia was administered with diastolic arrest of the heart.  Myocardial septal temperature was monitored throughout the cross-clamp.  Attention was turned first to the posterior descending coronary artery and circumflex.  The preop injection of the right system showed it to be totally occluded with very faint filling of the right system.  On exam, the posterior descending coronary artery was located and it was large enough to bypass.  The distal circumflex was of moderate size.  The posterior descending coronary artery was opened, admitted a 1 mm probe  distally using a diamond-type side-to-side anastomosis with a running 7-0 Prolene was performed.  Distal extent of the same vein was then carried to short distance to the distal circumflex, coronary artery, which was opened, admitted a 1.5 mm probe using a running 7-0 Prolene, distal  anastomosis was performed.  The heart was then elevated and the obtuse marginal vessels were dissected out.  The largest of these was dissected out with some difficulty of the intramyocardial position.  The vessel was opened and was approximately 1.2 mm in size.  Using a running 8-0 Prolene, a segment of reverse saphenous vein graft was anastomosed to the obtuse marginal.  Attention was then turned to the diagonal coronary artery, which was opened, was also a thin-walled vessel, admitting a 1 mm probe distally using a running 8-0 Prolene, distal anastomosis was performed. Attention was then turned to the distal left anterior descending coronary artery, which was opened, admitted 1.5 mm probe proximally and distally.  Using a running 8-0 Prolene, the left internal mammary artery was anastomosed to left anterior descending coronary artery.  With the crossclamp still in place, three punch aortotomies were performed and each of the 3 vein grafts were anastomosed to the ascending aorta.  Air was evacuated from grafts and aortic bulldog was removed from the mammary artery with prompt rise in myocardial septal temperature.  The heart was allowed to fill and de-aired through the proximal anastomoses and the proximal anastomoses were completed and aortic crossclamp removed with total cross-clamp time of 139 minutes.  The patient then converted to a slow sinus rhythm and then heart block and required atrial and ventricular pacing wires to be applied and placed.  The patient's body temperature rewarmed to 37 degrees, he was started on low- dose dopamine.  He was then ventilated with his body temperature rewarmed to 37 degrees, he was then ventilated and weaned from cardiopulmonary bypass without difficulty.  He was decannulated in usual fashion.  Protamine sulfate was administered with operative field hemostatic.  A left pleural tube and Blake mediastinal drain were left in place.  Sternum was  closed with #6 stainless steel wire.  Fascia was closed with interrupted 0 Vicryl, running 3-0 Vicryl, subcutaneous tissue, and 4-0 subcuticular stitch and skin edges.  Dry dressings were applied.  Sponge and needle count was reported as correct at completion of procedure.  Total pump time was 184 minutes.  The patient tolerated procedure without complication and did not require any blood bank, blood products during the operative procedure.     Sheliah Plane, MD     EG/MEDQ  D:  04/06/2015  T:  04/06/2015  Job:  [id number]

## 2015-04-20 ENCOUNTER — Encounter: Payer: Self-pay | Admitting: Internal Medicine

## 2015-04-20 ENCOUNTER — Encounter: Payer: Self-pay | Admitting: Cardiology

## 2015-04-20 ENCOUNTER — Ambulatory Visit (INDEPENDENT_AMBULATORY_CARE_PROVIDER_SITE_OTHER): Payer: Medicare Other | Admitting: Internal Medicine

## 2015-04-20 ENCOUNTER — Ambulatory Visit: Payer: Medicare Other | Admitting: Internal Medicine

## 2015-04-20 VITALS — BP 123/70 | HR 61 | Temp 97.5°F | Wt 203.2 lb

## 2015-04-20 DIAGNOSIS — I739 Peripheral vascular disease, unspecified: Secondary | ICD-10-CM | POA: Diagnosis not present

## 2015-04-20 DIAGNOSIS — I200 Unstable angina: Secondary | ICD-10-CM

## 2015-04-20 DIAGNOSIS — I700 Atherosclerosis of aorta: Secondary | ICD-10-CM

## 2015-04-20 DIAGNOSIS — E785 Hyperlipidemia, unspecified: Secondary | ICD-10-CM | POA: Insufficient documentation

## 2015-04-20 DIAGNOSIS — I2510 Atherosclerotic heart disease of native coronary artery without angina pectoris: Secondary | ICD-10-CM | POA: Diagnosis not present

## 2015-04-20 DIAGNOSIS — Z951 Presence of aortocoronary bypass graft: Secondary | ICD-10-CM | POA: Diagnosis not present

## 2015-04-20 DIAGNOSIS — F4322 Adjustment disorder with anxiety: Secondary | ICD-10-CM

## 2015-04-20 DIAGNOSIS — I480 Paroxysmal atrial fibrillation: Secondary | ICD-10-CM

## 2015-04-20 DIAGNOSIS — I10 Essential (primary) hypertension: Secondary | ICD-10-CM | POA: Diagnosis not present

## 2015-04-20 DIAGNOSIS — R600 Localized edema: Secondary | ICD-10-CM | POA: Insufficient documentation

## 2015-04-20 DIAGNOSIS — Z48812 Encounter for surgical aftercare following surgery on the circulatory system: Secondary | ICD-10-CM | POA: Diagnosis not present

## 2015-04-20 DIAGNOSIS — I4891 Unspecified atrial fibrillation: Secondary | ICD-10-CM | POA: Diagnosis not present

## 2015-04-20 LAB — COMPREHENSIVE METABOLIC PANEL
ALT: 93 U/L — ABNORMAL HIGH (ref 0–53)
AST: 34 U/L (ref 0–37)
Albumin: 3.6 g/dL (ref 3.5–5.2)
Alkaline Phosphatase: 96 U/L (ref 39–117)
BUN: 27 mg/dL — ABNORMAL HIGH (ref 6–23)
CO2: 28 mEq/L (ref 19–32)
Calcium: 9.3 mg/dL (ref 8.4–10.5)
Chloride: 100 mEq/L (ref 96–112)
Creatinine, Ser: 1.27 mg/dL (ref 0.40–1.50)
GFR: 59.45 mL/min — ABNORMAL LOW (ref >60.00)
Glucose, Bld: 92 mg/dL (ref 70–99)
Potassium: 4.1 mEq/L (ref 3.5–5.1)
Sodium: 137 mEq/L (ref 135–145)
Total Bilirubin: 0.5 mg/dL (ref 0.2–1.2)
Total Protein: 6.9 g/dL (ref 6.0–8.3)

## 2015-04-20 LAB — CBC
HCT: 34.1 % — ABNORMAL LOW (ref 39.0–52.0)
Hemoglobin: 11.1 g/dL — ABNORMAL LOW (ref 13.0–17.0)
MCHC: 32.6 g/dL (ref 30.0–36.0)
MCV: 98.2 fl (ref 78.0–100.0)
Platelets: 427 10*3/uL — ABNORMAL HIGH (ref 150.0–400.0)
RBC: 3.47 Mil/uL — ABNORMAL LOW (ref 4.22–5.81)
RDW: 13.4 % (ref 11.5–15.5)
WBC: 11.1 10*3/uL — ABNORMAL HIGH (ref 4.0–10.5)

## 2015-04-20 MED ORDER — ATORVASTATIN CALCIUM 40 MG PO TABS: 40.0000 mg | ORAL_TABLET | Freq: Every day | ORAL | Status: DC

## 2015-04-20 MED ORDER — FUROSEMIDE 20 MG PO TABS: 20.0000 mg | ORAL_TABLET | Freq: Every day | ORAL | Status: DC | PRN

## 2015-04-20 MED ORDER — ESCITALOPRAM OXALATE 5 MG PO TABS
5.0000 mg | ORAL_TABLET | Freq: Every day | ORAL | Status: DC
Start: 2015-04-20 — End: 2015-06-21

## 2015-04-20 NOTE — Progress Notes (Addendum)
//  'ubjective:    Patient ID: [name], male    DOB: Nov 30, 1945, 69 y.o.   MRN: [id number]  HPI  68YO male presents for hospital follow up.  ADMISSION: 03/31/2015 DISCHARGE: 04/11/2015  DIAGNOSIS: CAD s/p CABG, AFIB  Admitted after cath showed multivessel CAD. See previous notes for description of chest pain symptoms at last visit.  Reviewed information from hospitalization. Cardiac Cath: Conclusion  1.   Severe multivessel disease with heavy calcification of the proximal left and right coronary arteries. 2.   LM lesion, 65% stenosed. 3.   Mid LAD-1 lesion, 70% stenosed. Mid LAD-2 lesion, 90% stenosed prior to D1. Distmid LAD lesion, 80% stenosed. 4.   Ost Cx to Prox Cx lesion, 75% stenosed. Mid Cx lesion, 95% stenosed. Dist Cx lesion, 70% stenosed. 1st Mrg lesion, 60% stenosed wtih 80% in smaller OM1a branch 5.   Mid RCA lesion, 90% stenosed. Mid RCA to Dist RCA lesion, 100% stenosed - CTO. There is R-R collateralization from the RV Marginal branch to RPDA faintly filling the RPDA & small RPL. 6.   The left ventricular systolic function is normal. 7.   New onset Diagnosis of Afib - initially with RVR  ECHO Post CABG: Study Conclusions - Left ventricle: Systolic function was normal. The estimated ejection fraction was in the range of 55% to 60%. - Left atrium: No evidence of thrombus in the atrial cavity or appendage. - Atrial septum: No defect or patent foramen ovale was identified. Impressions: - Post bypass: Mildly improved LV systolic function from prebypass. No change in MR/TR.  Generally, doing well. No chest pain. Some palpitations, or "swishing" feeling at times in chest. No dyspnea. No orthopnea. LE edema has improved, nearly resolved. Notes frequent urination which disrupts sleep. Would like to change Lasix to prn, taking only 20mg  now. Not taking any pain medication except for neurontin scheduled.  Wife reports he has been anxious after his procedure.  Not sleeping in his bed. Sleeps on couch. Tried to take Lorazepam as prescribed, but this makes him feel drunk. He denies extreme anxiety.  Tolerating regular diet. No NVDC.   Wt Readings from Last 3 Encounters:  04/20/15 203 lb 4 oz (92.194 kg)  04/18/15 202 lb 3.2 oz (91.717 kg)  04/11/15 209 lb 10.5 oz (95.1 kg)   BP Readings from Last 3 Encounters:  04/20/15 123/70  04/18/15 114/60  04/11/15 113/53    Past Medical History  Diagnosis Date  . Peripheral vascular disease (HCC)     Bilateral ABIs 0.8  . Chronic distal aortic occlusion (HCC)     By report  . Diabetes mellitus     By his report, this is prediabetes   . SOB (shortness of breath)   . Hyperlipidemia LDL goal <70   . Essential hypertension   . Weakness   . Erectile dysfunction   . Hypogonadism in male     Previously on testosterone injections, but has not yet restarted until further testing  . Enlarged prostate   . Arthritis   . Depression   . History of kidney stones   . Muscle pain   . CAD, multiple vessel 03/2105    LM 65%, mLAD 70%, 90% & 80%, o-pCx 75%, mCx 95% & d Cx 70%, OM1 60%, mRCA 90% with 100% dRCA. R-R collaterals  . S/P CABG x 5 04/04/2015    LIMA to LAD, SVG to DIAG, SVG to OM, seqSVG -PDA- dCx  . PAF (paroxysmal atrial fibrillation) (HCC) 03/2015  Noted per-cath & post-op CABG.   Family History  Problem Relation Age of Onset  . Coronary artery disease Father   . Heart disease Father   . Stroke Mother   . Stroke Sister   . Prostate cancer Neg Hx   . Kidney disease Neg Hx    Past Surgical History  Procedure Laterality Date  . Cardiac catheterization  1992  . Ventral hernia repair  1994    complicated by infection  . Cholecystectomy  2000    also had revision of hernia repair  . Cystectomy      face and scrotum  . Blepharoplasty    . Lexiscan myoview  June 2010    converted to [facility] from treadmill due to inability to reach target heart rate) --the study was normal with no  evidence of ischemia and normal LV function. EF 7  . Cardiac catheterization N/A 03/31/2015    Procedure: Left Heart Cath and Coronary Angiography;  Surgeon: Marykay Lex, MD;  Location: [facility] INVASIVE CV LAB;  Service: Cardiovascular;  Laterality: N/A;  . Coronary artery bypass graft N/A 04/04/2015    Procedure: CORONARY ARTERY BYPASS GRAFTING (CABG);  Surgeon: [name], MD;  Location: [facility] OR;  Service: Open Heart Surgery;  Laterality: N/A;  . Tee without cardioversion N/A 04/04/2015    Procedure: TRANSESOPHAGEAL ECHOCARDIOGRAM (TEE);  Surgeon: [name], MD;  Location: [facility] OR;  Service: Open Heart Surgery;  Laterality: N/A;   Social History   Social History  . Marital Status: Married    Spouse Name: N/A  . Number of Children: N/A  . Years of Education: N/A   Social History Main Topics  . Smoking status: Former Smoker -- 2.00 packs/day for 40 years    Types: Cigarettes    Quit date: 10/17/1992  . Smokeless tobacco: Never Used     Comment: quit 1994  . Alcohol Use: 0.0 oz/week    0 Standard drinks or equivalent per week     Comment: seldom  . Drug Use: No  . Sexual Activity: Not Asked   Other Topics Concern  . None   Social History Narrative   Lives in [place] with wife. No pets   Two children.      Former smoker. Quit in 1994. Prior to quitting was unable to walk without exertional dyspnea.      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      Hobbies - reading, drives Corvette, chuch    Review of Systems  Constitutional: Negative for fever, chills, activity change, appetite change, fatigue and unexpected weight change.  Eyes: Negative for visual disturbance.  Respiratory: Negative for cough and shortness of breath.   Cardiovascular: Positive for palpitations. Negative for chest pain and leg swelling.  Gastrointestinal: Negative for nausea, vomiting, abdominal pain, diarrhea,  constipation and abdominal distention.  Genitourinary: Negative for dysuria, urgency and difficulty urinating.  Musculoskeletal: Negative for arthralgias and gait problem.  Skin: Negative for color change and rash.  Hematological: Negative for adenopathy.  Psychiatric/Behavioral: Negative for sleep disturbance and dysphoric mood. The patient is not nervous/anxious.        Objective:    BP 123/70 mmHg  Pulse 61  Temp(Src) 97.5 F (36.4 C) (Oral)  Wt 203 lb 4 oz (92.194 kg)  SpO2 97% Physical Exam  Constitutional: He is oriented to person, place, and time. He appears well-developed and well-nourished. No distress.  HENT:  Head: Normocephalic and atraumatic.  Right Ear: External ear normal.  Left Ear: External ear normal.  Nose: Nose normal.  Mouth/Throat: Oropharynx is clear and moist. No oropharyngeal exudate.  Eyes: Conjunctivae and EOM are normal. Pupils are equal, round, and reactive to light. Right eye exhibits no discharge. Left eye exhibits no discharge. No scleral icterus.  Neck: Normal range of motion. Neck supple. No tracheal deviation present. No thyromegaly present.  Cardiovascular: Normal rate, regular rhythm and normal heart sounds.   Extrasystoles are present. Exam reveals no gallop and no friction rub.   No murmur heard. Pulmonary/Chest: Effort normal and breath sounds normal. No accessory muscle usage. No tachypnea. No respiratory distress. He has no decreased breath sounds. He has no wheezes. He has no rhonchi. He has no rales. He exhibits no tenderness.    Musculoskeletal: Normal range of motion. He exhibits edema (trace ankles).  Lymphadenopathy:    He has no cervical adenopathy.  Neurological: He is alert and oriented to person, place, and time. No cranial nerve deficit. Coordination normal.  Skin: Skin is warm and dry. No rash noted. He is not diaphoretic. No erythema. No pallor.     Psychiatric: He has a normal mood and affect. His speech is normal and  behavior is normal. Judgment and thought content normal. Cognition and memory are normal.          Assessment & Plan:  Over of which >50% spent in face-to-face contact with patient discussing plan of care with patient and his wife.  Problem List Items Addressed This Visit      Unprioritized   Adjustment disorder with anxious mood    Recent increased anxiety with health issues. Unable to tolerate Lorazepam as started inpatient, because of drowsiness. Discussed some options for treatment. Will add Lexapro  daily. Follow up in 4 weeks and prn.      Relevant Medications   escitalopram (LEXAPRO) 5 MG tablet   Aortic atherosclerosis (HCC) (Chronic)    Changing to high intensity statin medication. Tolerated Atorvastatin inpatient. Will stop Simvastatin and start Atorvastatin  daily. Recheck LFTs and lipids in 4 weeks.      Relevant Medications   furosemide (LASIX) 20 MG tablet   atorvastatin (LIPITOR) 40 MG tablet   CAD, multiple vessel - Primary    S/p 5 vessel CABG. Incision healing well. Overall, doing very well. Walking some. Tolerating PT. Will change Simvastatin to Atorvastatin as noted. Continue Metoprolol, Aspirin. Follow up with surgeon and cardiology as scheduled.      Relevant Medications   furosemide (LASIX) 20 MG tablet   atorvastatin (LIPITOR) 40 MG tablet   Other Relevant Orders   Comprehensive metabolic panel   CBC   PAF (paroxysmal atrial fibrillation) (HCC)    NSR on exam today. EKG shows bradycardia with regular rhythm. Continue Amiodarone and Metoprolol. Continue Aspirin. Follow up with cardiology as scheduled and here in 4 weeks.      Relevant Medications   furosemide (LASIX) 20 MG tablet   atorvastatin (LIPITOR) 40 MG tablet   Other Relevant Orders   EKG 12-Lead (Completed)       Return in about 4 weeks (around 05/18/2015) for Recheck.

## 2015-04-20 NOTE — Assessment & Plan Note (Signed)
On statin. Not sure what his last labs were. Would consider possibly increasing to higher potency statin such as atorvastatin

## 2015-04-20 NOTE — Patient Instructions (Signed)
Stop Simvastatin.  Start Atorvastatin  daily.  Stop Tramadol and Oxycodone.  Stop Lorazepam.  Start Lexapro  daily.

## 2015-04-20 NOTE — Assessment & Plan Note (Signed)
No recurrent angina since his cath/CABG. Having some some difficulties emotionally from an anxiety standpoint doing with having been through CABG. Having difficulty with sleeping and is otherwise anxious.  I prescribed some Ativan that he can use for anxiety or insomnia, but recommended he talk with his primary physician as well.

## 2015-04-20 NOTE — Assessment & Plan Note (Signed)
None expected anxiety over his health issues. At provided some Ativan. Deferred to primary physician for further management. Consider SSRI or SNRI.

## 2015-04-20 NOTE — Assessment & Plan Note (Signed)
Referred for CABG x 5 following cardiac catheterization for exertional angina. Roughly one week following this charge. Doing relatively well. On beta blocker, aspirin and statin - consider switching to atorvastatin  Waiting for the cardiac surgery clearance for cardiac rehabilitation.

## 2015-04-20 NOTE — Assessment & Plan Note (Signed)
Changing to high intensity statin medication. Tolerated Atorvastatin inpatient. Will stop Simvastatin and start Atorvastatin  daily. Recheck LFTs and lipids in 4 weeks.

## 2015-04-20 NOTE — Assessment & Plan Note (Signed)
S/p 5 vessel CABG. Incision healing well. Overall, doing very well. Walking some. Tolerating PT. Will change Simvastatin to Atorvastatin as noted. Continue Metoprolol, Aspirin. Follow up with surgeon and cardiology as scheduled.

## 2015-04-20 NOTE — Assessment & Plan Note (Signed)
NSR on exam today. EKG shows bradycardia with regular rhythm. Continue Amiodarone and Metoprolol. Continue Aspirin. Follow up with cardiology as scheduled and here in 4 weeks.

## 2015-04-20 NOTE — Progress Notes (Signed)
Pre visit review using our clinic review tool, if applicable. No additional management support is needed unless otherwise documented below in the visit note. 

## 2015-04-20 NOTE — Assessment & Plan Note (Signed)
Still noted volume overloaded from his hospital stay. Will continue Lasix but decrease to 20 mg daily. Once his swelling has resolved, he can then use Lasix when necessary

## 2015-04-20 NOTE — Assessment & Plan Note (Signed)
Recent increased anxiety with health issues. Unable to tolerate Lorazepam as started inpatient, because of drowsiness. Discussed some options for treatment. Will add Lexapro  daily. Follow up in 4 weeks and prn.

## 2015-04-20 NOTE — Assessment & Plan Note (Signed)
Sinus rhythm today. On amiodarone. Would continue amiodarone: Beta blocker for now. Not currently on anticoagulation beyond aspirin. I would like to see if there is recurrence before we consider full and granulation. Would likely stop amiodarone after a few months.

## 2015-04-21 ENCOUNTER — Encounter: Payer: Self-pay | Admitting: Internal Medicine

## 2015-04-21 DIAGNOSIS — Z48812 Encounter for surgical aftercare following surgery on the circulatory system: Secondary | ICD-10-CM | POA: Diagnosis not present

## 2015-04-21 DIAGNOSIS — I2510 Atherosclerotic heart disease of native coronary artery without angina pectoris: Secondary | ICD-10-CM | POA: Diagnosis not present

## 2015-04-21 DIAGNOSIS — I739 Peripheral vascular disease, unspecified: Secondary | ICD-10-CM | POA: Diagnosis not present

## 2015-04-21 DIAGNOSIS — I10 Essential (primary) hypertension: Secondary | ICD-10-CM | POA: Diagnosis not present

## 2015-04-21 DIAGNOSIS — Z951 Presence of aortocoronary bypass graft: Secondary | ICD-10-CM | POA: Diagnosis not present

## 2015-04-21 DIAGNOSIS — I4891 Unspecified atrial fibrillation: Secondary | ICD-10-CM | POA: Diagnosis not present

## 2015-04-22 DIAGNOSIS — H2513 Age-related nuclear cataract, bilateral: Secondary | ICD-10-CM | POA: Diagnosis not present

## 2015-04-22 DIAGNOSIS — H02834 Dermatochalasis of left upper eyelid: Secondary | ICD-10-CM | POA: Diagnosis not present

## 2015-04-22 DIAGNOSIS — H43393 Other vitreous opacities, bilateral: Secondary | ICD-10-CM | POA: Diagnosis not present

## 2015-04-25 DIAGNOSIS — Z951 Presence of aortocoronary bypass graft: Secondary | ICD-10-CM | POA: Diagnosis not present

## 2015-04-25 DIAGNOSIS — I2510 Atherosclerotic heart disease of native coronary artery without angina pectoris: Secondary | ICD-10-CM | POA: Diagnosis not present

## 2015-04-25 DIAGNOSIS — I4891 Unspecified atrial fibrillation: Secondary | ICD-10-CM | POA: Diagnosis not present

## 2015-04-25 DIAGNOSIS — I10 Essential (primary) hypertension: Secondary | ICD-10-CM | POA: Diagnosis not present

## 2015-04-25 DIAGNOSIS — Z48812 Encounter for surgical aftercare following surgery on the circulatory system: Secondary | ICD-10-CM | POA: Diagnosis not present

## 2015-04-25 DIAGNOSIS — I739 Peripheral vascular disease, unspecified: Secondary | ICD-10-CM | POA: Diagnosis not present

## 2015-04-27 DIAGNOSIS — Z48812 Encounter for surgical aftercare following surgery on the circulatory system: Secondary | ICD-10-CM | POA: Diagnosis not present

## 2015-04-27 DIAGNOSIS — Z951 Presence of aortocoronary bypass graft: Secondary | ICD-10-CM | POA: Diagnosis not present

## 2015-04-27 DIAGNOSIS — I4891 Unspecified atrial fibrillation: Secondary | ICD-10-CM | POA: Diagnosis not present

## 2015-04-27 DIAGNOSIS — I10 Essential (primary) hypertension: Secondary | ICD-10-CM | POA: Diagnosis not present

## 2015-04-27 DIAGNOSIS — I739 Peripheral vascular disease, unspecified: Secondary | ICD-10-CM | POA: Diagnosis not present

## 2015-04-27 DIAGNOSIS — I2510 Atherosclerotic heart disease of native coronary artery without angina pectoris: Secondary | ICD-10-CM | POA: Diagnosis not present

## 2015-04-29 DIAGNOSIS — Z951 Presence of aortocoronary bypass graft: Secondary | ICD-10-CM | POA: Diagnosis not present

## 2015-04-29 DIAGNOSIS — I10 Essential (primary) hypertension: Secondary | ICD-10-CM | POA: Diagnosis not present

## 2015-04-29 DIAGNOSIS — I4891 Unspecified atrial fibrillation: Secondary | ICD-10-CM | POA: Diagnosis not present

## 2015-04-29 DIAGNOSIS — I2510 Atherosclerotic heart disease of native coronary artery without angina pectoris: Secondary | ICD-10-CM | POA: Diagnosis not present

## 2015-04-29 DIAGNOSIS — I739 Peripheral vascular disease, unspecified: Secondary | ICD-10-CM | POA: Diagnosis not present

## 2015-04-29 DIAGNOSIS — Z48812 Encounter for surgical aftercare following surgery on the circulatory system: Secondary | ICD-10-CM | POA: Diagnosis not present

## 2015-05-03 DIAGNOSIS — I2510 Atherosclerotic heart disease of native coronary artery without angina pectoris: Secondary | ICD-10-CM | POA: Diagnosis not present

## 2015-05-03 DIAGNOSIS — Z48812 Encounter for surgical aftercare following surgery on the circulatory system: Secondary | ICD-10-CM | POA: Diagnosis not present

## 2015-05-03 DIAGNOSIS — I739 Peripheral vascular disease, unspecified: Secondary | ICD-10-CM | POA: Diagnosis not present

## 2015-05-03 DIAGNOSIS — I10 Essential (primary) hypertension: Secondary | ICD-10-CM | POA: Diagnosis not present

## 2015-05-03 DIAGNOSIS — Z951 Presence of aortocoronary bypass graft: Secondary | ICD-10-CM | POA: Diagnosis not present

## 2015-05-03 DIAGNOSIS — I4891 Unspecified atrial fibrillation: Secondary | ICD-10-CM | POA: Diagnosis not present

## 2015-05-04 DIAGNOSIS — I10 Essential (primary) hypertension: Secondary | ICD-10-CM | POA: Diagnosis not present

## 2015-05-04 DIAGNOSIS — I4891 Unspecified atrial fibrillation: Secondary | ICD-10-CM | POA: Diagnosis not present

## 2015-05-04 DIAGNOSIS — Z48812 Encounter for surgical aftercare following surgery on the circulatory system: Secondary | ICD-10-CM | POA: Diagnosis not present

## 2015-05-04 DIAGNOSIS — Z951 Presence of aortocoronary bypass graft: Secondary | ICD-10-CM | POA: Diagnosis not present

## 2015-05-04 DIAGNOSIS — I739 Peripheral vascular disease, unspecified: Secondary | ICD-10-CM | POA: Diagnosis not present

## 2015-05-04 DIAGNOSIS — I2510 Atherosclerotic heart disease of native coronary artery without angina pectoris: Secondary | ICD-10-CM | POA: Diagnosis not present

## 2015-05-05 DIAGNOSIS — I4891 Unspecified atrial fibrillation: Secondary | ICD-10-CM | POA: Diagnosis not present

## 2015-05-05 DIAGNOSIS — I2510 Atherosclerotic heart disease of native coronary artery without angina pectoris: Secondary | ICD-10-CM | POA: Diagnosis not present

## 2015-05-05 DIAGNOSIS — Z48812 Encounter for surgical aftercare following surgery on the circulatory system: Secondary | ICD-10-CM | POA: Diagnosis not present

## 2015-05-05 DIAGNOSIS — I739 Peripheral vascular disease, unspecified: Secondary | ICD-10-CM | POA: Diagnosis not present

## 2015-05-05 DIAGNOSIS — Z951 Presence of aortocoronary bypass graft: Secondary | ICD-10-CM | POA: Diagnosis not present

## 2015-05-05 DIAGNOSIS — I10 Essential (primary) hypertension: Secondary | ICD-10-CM | POA: Diagnosis not present

## 2015-05-10 DIAGNOSIS — I10 Essential (primary) hypertension: Secondary | ICD-10-CM | POA: Diagnosis not present

## 2015-05-10 DIAGNOSIS — I4891 Unspecified atrial fibrillation: Secondary | ICD-10-CM | POA: Diagnosis not present

## 2015-05-10 DIAGNOSIS — I2510 Atherosclerotic heart disease of native coronary artery without angina pectoris: Secondary | ICD-10-CM | POA: Diagnosis not present

## 2015-05-10 DIAGNOSIS — Z951 Presence of aortocoronary bypass graft: Secondary | ICD-10-CM | POA: Diagnosis not present

## 2015-05-10 DIAGNOSIS — I739 Peripheral vascular disease, unspecified: Secondary | ICD-10-CM | POA: Diagnosis not present

## 2015-05-10 DIAGNOSIS — Z48812 Encounter for surgical aftercare following surgery on the circulatory system: Secondary | ICD-10-CM | POA: Diagnosis not present

## 2015-05-12 ENCOUNTER — Ambulatory Visit: Payer: Medicare Other | Admitting: Cardiothoracic Surgery

## 2015-05-12 DIAGNOSIS — Z951 Presence of aortocoronary bypass graft: Secondary | ICD-10-CM | POA: Diagnosis not present

## 2015-05-12 DIAGNOSIS — I4891 Unspecified atrial fibrillation: Secondary | ICD-10-CM | POA: Diagnosis not present

## 2015-05-12 DIAGNOSIS — I739 Peripheral vascular disease, unspecified: Secondary | ICD-10-CM | POA: Diagnosis not present

## 2015-05-12 DIAGNOSIS — Z48812 Encounter for surgical aftercare following surgery on the circulatory system: Secondary | ICD-10-CM | POA: Diagnosis not present

## 2015-05-12 DIAGNOSIS — I10 Essential (primary) hypertension: Secondary | ICD-10-CM | POA: Diagnosis not present

## 2015-05-12 DIAGNOSIS — I2510 Atherosclerotic heart disease of native coronary artery without angina pectoris: Secondary | ICD-10-CM | POA: Diagnosis not present

## 2015-05-13 ENCOUNTER — Other Ambulatory Visit: Payer: Self-pay | Admitting: Cardiothoracic Surgery

## 2015-05-13 DIAGNOSIS — Z951 Presence of aortocoronary bypass graft: Secondary | ICD-10-CM

## 2015-05-16 ENCOUNTER — Ambulatory Visit
Admission: RE | Admit: 2015-05-16 | Discharge: 2015-05-16 | Disposition: A | Discharge status: Home / Self Care | Payer: Medicare Other | Source: Ambulatory Visit | Attending: Cardiothoracic Surgery | Admitting: Cardiothoracic Surgery

## 2015-05-16 ENCOUNTER — Ambulatory Visit (INDEPENDENT_AMBULATORY_CARE_PROVIDER_SITE_OTHER): Payer: Self-pay | Admitting: Physician Assistant

## 2015-05-16 VITALS — BP 98/60 | HR 65 | Resp 16 | Ht 69.0 in | Wt 197.0 lb

## 2015-05-16 DIAGNOSIS — Z951 Presence of aortocoronary bypass graft: Secondary | ICD-10-CM

## 2015-05-16 DIAGNOSIS — I2510 Atherosclerotic heart disease of native coronary artery without angina pectoris: Secondary | ICD-10-CM

## 2015-05-16 NOTE — Progress Notes (Signed)
HPI:  SURGICAL PROCEDURE: Coronary artery bypass grafting x5 with the left internal mammary to the left anterior descending coronary artery, reverse saphenous vein graft to the diagonal coronary artery, reverse saphenous vein graft to intramyocardial obtuse marginal, and sequential reverse saphenous vein graft to the posterior descending and distal circumflex with right greater saphenous endovein harvesting thigh and calf, and left thigh greater saphenous vein endoscopic harvest.  Patient returns for routine postoperative follow-up having undergone a CABG x 5 by Dr. Tyrone SageGerhardt on 04/04/2015. The patient's early postoperative recovery while in the hospital was notable for atrial fibrillation. Since hospital discharge the patient reports he occasionally feels a "flopping" but is unsure if it is his Heart and he has some fatigue. He and his wife also report that both physical therapy and home health have noted that his blood pressure is more labile of late.   Current Outpatient Prescriptions  Medication Sig Dispense Refill  . acetaminophen (TYLENOL) 650 MG CR tablet Take 650 mg by mouth every 8 (eight) hours as needed.    Marland Kitchen. amiodarone (PACERONE) 200 MG tablet Take 1 tablet (200 mg total) by mouth daily. 30 tablet 3  . aspirin EC 81 MG EC tablet Take 1 tablet (81 mg total) by mouth daily.    Marland Kitchen. atorvastatin (LIPITOR) 40 MG tablet Take 1 tablet (40 mg total) by mouth daily. 90 tablet 3  . escitalopram (LEXAPRO) 5 MG tablet Take 1 tablet (5 mg total) by mouth daily. 90 tablet 1  . fexofenadine (ALLEGRA) 180 MG tablet Take 180 mg by mouth daily.    . finasteride (PROSCAR) 5 MG tablet Take 1 tablet (5 mg total) by mouth daily. 90 tablet 3  . furosemide (LASIX) 20 MG tablet Take 1 tablet (20 mg total) by mouth daily as needed. 90 tablet 3  . gabapentin (NEURONTIN) 400 MG capsule Take 1 capsule (400 mg total) by mouth 3 (three) times daily. 270 capsule 3  . metoprolol tartrate (LOPRESSOR) 25 MG  tablet Take 1 tablet (25 mg total) by mouth 2 (two) times daily. 60 tablet 3  . Multiple Vitamin (MULTIVITAMIN) tablet Take 1 tablet by mouth daily.    Marland Kitchen. NITROSTAT 0.4 MG SL tablet Place 0.4 mg under the tongue every 5 (five) minutes x 3 doses as needed. (CHEST PAIN)    Vital Signs: BP 98/60, HR 65, RR 16, oxygen saturation 96% on room air   Physical Exam: CV-RRR Pulmonary-Clear to auscultation bilaterally Extremities-No edema Wounds-Clean and dry. No signs of infection. Sternum with no instability  Diagnostic Tests: CLINICAL DATA: Status post CABG x5 on 04/04/2015  EXAM: CHEST - 2 VIEW  COMPARISON: 04/08/2015  FINDINGS: Cardiac and mediastinal contours are stable. Mild bibasilar scarring/atelectasis present. There is no evidence of pulmonary edema, consolidation, pneumothorax, nodule or pleural fluid. Stable spondylosis of the thoracic spine.  IMPRESSION: Mild bibasilar scarring/atelectasis.   Electronically Signed  By: Irish LackGlenn Yamagata M.D.  On: 05/16/2015 13:30  Impression and Plan: Overall, [name] is continuing to recover well from coronary artery bypass grafting surgery. Regarding his blood pressure, I discussed with he and his wife, that I would decrease his Lopressor to 12.5 mg bid. Per Dr. Elissa HeftyHarding's notes, he would like him to be on the Amiodarone 200 mg daily for a few months so I do not want to stop it. Also, Dr. Herbie BaltimoreHarding would like to see if he has a recurrence of atrial fibrillation before considering anticoagulation. I instructed him to continue ecasa 81 mg daily and Lipitor as prescribed  by Dr. [name]. Regarding the "flopping" he feels, he may be having palpitations or brief episodes of atrial fibrillation. He does not have any sternal instability. I told him if it continues to occur, to contact Dr. Elissa Hefty office. I instructed him to continue with sternal precautions (i.e. No lifting more than 10 pounds) for at least 2 more weeks. He is not taking  narcotics. He was instructed he may begin driving short distances (i.e. 30 minutes or less during the day or less). He was told he may participate in cardiac rehab. He will return to see Dr. Tyrone Sage PRN, at his request.    Marc Balls, PA-C [facility] [phone]

## 2015-05-18 ENCOUNTER — Ambulatory Visit (INDEPENDENT_AMBULATORY_CARE_PROVIDER_SITE_OTHER): Payer: Medicare Other | Admitting: Internal Medicine

## 2015-05-18 ENCOUNTER — Encounter: Payer: Self-pay | Admitting: Internal Medicine

## 2015-05-18 VITALS — BP 98/59 | HR 69 | Temp 98.0°F | Ht 70.0 in | Wt 206.0 lb

## 2015-05-18 DIAGNOSIS — I25119 Atherosclerotic heart disease of native coronary artery with unspecified angina pectoris: Secondary | ICD-10-CM

## 2015-05-18 DIAGNOSIS — I10 Essential (primary) hypertension: Secondary | ICD-10-CM | POA: Diagnosis not present

## 2015-05-18 DIAGNOSIS — F4322 Adjustment disorder with anxiety: Secondary | ICD-10-CM | POA: Diagnosis not present

## 2015-05-18 DIAGNOSIS — I200 Unstable angina: Secondary | ICD-10-CM

## 2015-05-18 DIAGNOSIS — D649 Anemia, unspecified: Secondary | ICD-10-CM | POA: Diagnosis not present

## 2015-05-18 LAB — COMPREHENSIVE METABOLIC PANEL
ALT: 22 U/L (ref 0–53)
AST: 18 U/L (ref 0–37)
Albumin: 3.6 g/dL (ref 3.5–5.2)
Alkaline Phosphatase: 76 U/L (ref 39–117)
BUN: 25 mg/dL — ABNORMAL HIGH (ref 6–23)
CO2: 25 mEq/L (ref 19–32)
Calcium: 9 mg/dL (ref 8.4–10.5)
Chloride: 104 mEq/L (ref 96–112)
Creatinine, Ser: 1.13 mg/dL (ref 0.40–1.50)
GFR: 68.01 mL/min (ref >60.00)
Glucose, Bld: 145 mg/dL — ABNORMAL HIGH (ref 70–99)
Potassium: 4.2 mEq/L (ref 3.5–5.1)
Sodium: 138 mEq/L (ref 135–145)
Total Bilirubin: 0.4 mg/dL (ref 0.2–1.2)
Total Protein: 6.3 g/dL (ref 6.0–8.3)

## 2015-05-18 LAB — CBC WITH DIFFERENTIAL/PLATELET
Basophils Absolute: 0 10*3/uL (ref 0.0–0.1)
Basophils Relative: 0.5 % (ref 0.0–3.0)
Eosinophils Absolute: 0.1 10*3/uL (ref 0.0–0.7)
Eosinophils Relative: 1.7 % (ref 0.0–5.0)
HCT: 39.5 % (ref 39.0–52.0)
Hemoglobin: 13.1 g/dL (ref 13.0–17.0)
Lymphocytes Relative: 20.2 % (ref 12.0–46.0)
Lymphs Abs: 1.4 10*3/uL (ref 0.7–4.0)
MCHC: 33 g/dL (ref 30.0–36.0)
MCV: 95.3 fl (ref 78.0–100.0)
Monocytes Absolute: 1 10*3/uL (ref 0.1–1.0)
Monocytes Relative: 14.1 % — ABNORMAL HIGH (ref 3.0–12.0)
Neutro Abs: 4.4 10*3/uL (ref 1.4–7.7)
Neutrophils Relative %: 63.5 % (ref 43.0–77.0)
Platelets: 161 10*3/uL (ref 150.0–400.0)
RBC: 4.15 Mil/uL — ABNORMAL LOW (ref 4.22–5.81)
RDW: 13.2 % (ref 11.5–15.5)
WBC: 6.9 10*3/uL (ref 4.0–10.5)

## 2015-05-18 NOTE — Progress Notes (Signed)
Subjective:    Patient ID: [name], male    DOB: 06-07-1946, 69 y.o.   MRN: [id number]  HPI 68YO male presents for follow up.  S/p CABG. Followed up with thoracic surgery 10/31. Decreased Lopressor to 12.5mg  po bid. Continued on Amiodarone. CXR showed mild bibasilar scarring/atelectasis.  Some pressure at site of CABG. No chest pain. Mild dyspnea over last few days. Some mild fatigue. Has follow up with cardiology in December, Dr. [name]. Starting cardiac rehab tomorrow.  Hasn't noticed any change in anxiety with Lexapro. Does not feel anxious.    Wt Readings from Last 3 Encounters:  05/18/15 206 lb (93.441 kg)  05/16/15 197 lb (89.359 kg)  04/20/15 203 lb 4 oz (92.194 kg)   BP Readings from Last 3 Encounters:  05/18/15 98/59  05/16/15 98/60  04/20/15 123/70    Past Medical History  Diagnosis Date  . Peripheral vascular disease (HCC)     Bilateral ABIs 0.8  . Chronic distal aortic occlusion (HCC)     By report  . Diabetes mellitus     By his report, this is prediabetes   . SOB (shortness of breath)   . Hyperlipidemia LDL goal <70   . Essential hypertension   . Weakness   . Erectile dysfunction   . Hypogonadism in male     Previously on testosterone injections, but has not yet restarted until further testing  . Enlarged prostate   . Arthritis   . Depression   . History of kidney stones   . Muscle pain   . CAD, multiple vessel 03/2105    LM 65%, mLAD 70%, 90% & 80%, o-pCx 75%, mCx 95% & d Cx 70%, OM1 60%, mRCA 90% with 100% dRCA. R-R collaterals  . S/P CABG x 5 04/04/2015    LIMA to LAD, SVG to DIAG, SVG to OM, seqSVG -PDA- dCx  . PAF (paroxysmal atrial fibrillation) (HCC) 03/2015    Noted per-cath & post-op CABG.   Family History  Problem Relation Age of Onset  . Coronary artery disease Father   . Heart disease Father   . Stroke Mother   . Stroke Sister   . Prostate cancer Neg Hx   . Kidney disease Neg Hx    Past Surgical History  Procedure  Laterality Date  . Cardiac catheterization  1992  . Ventral hernia repair  1994    complicated by infection  . Cholecystectomy  2000    also had revision of hernia repair  . Cystectomy      face and scrotum  . Blepharoplasty    . Lexiscan myoview  June 2010    converted to [facility] from treadmill due to inability to reach target heart rate) --the study was normal with no evidence of ischemia and normal LV function. EF 7  . Cardiac catheterization N/A 03/31/2015    Procedure: Left Heart Cath and Coronary Angiography;  Surgeon: Marykay Lex, MD;  Location: [facility] INVASIVE CV LAB;  Service: Cardiovascular;  Laterality: N/A;  . Coronary artery bypass graft N/A 04/04/2015    Procedure: CORONARY ARTERY BYPASS GRAFTING (CABG);  Surgeon: [name], MD;  Location: [facility] OR;  Service: Open Heart Surgery;  Laterality: N/A;  . Tee without cardioversion N/A 04/04/2015    Procedure: TRANSESOPHAGEAL ECHOCARDIOGRAM (TEE);  Surgeon: [name], MD;  Location: [facility] OR;  Service: Open Heart Surgery;  Laterality: N/A;   Social History   Social History  . Marital Status: Married    Spouse  Name: N/A  . Number of Children: N/A  . Years of Education: N/A   Social History Main Topics  . Smoking status: Former Smoker -- 2.00 packs/day for 40 years    Types: Cigarettes    Quit date: 10/17/1992  . Smokeless tobacco: Never Used     Comment: quit 1994  . Alcohol Use: 0.0 oz/week    0 Standard drinks or equivalent per week     Comment: seldom  . Drug Use: No  . Sexual Activity: Not Asked   Other Topics Concern  . None   Social History Narrative   Lives in [place] with wife. No pets   Two children.      Former smoker. Quit in 1994. Prior to quitting was unable to walk without exertional dyspnea.      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      Hobbies - reading, drives Corvette, chuch    Review of Systems    Constitutional: Negative for fever, chills, activity change, appetite change, fatigue and unexpected weight change.  Eyes: Negative for visual disturbance.  Respiratory: Positive for shortness of breath (occasional). Negative for cough and wheezing.   Cardiovascular: Positive for palpitations (occasional). Negative for chest pain and leg swelling.  Gastrointestinal: Negative for abdominal pain, diarrhea, constipation and abdominal distention.  Genitourinary: Negative for dysuria, urgency and difficulty urinating.  Musculoskeletal: Negative for arthralgias and gait problem.  Skin: Negative for color change and rash.  Hematological: Negative for adenopathy.  Psychiatric/Behavioral: Negative for suicidal ideas, sleep disturbance and dysphoric mood. The patient is not nervous/anxious.        Objective:    BP 98/59 mmHg  Pulse 69  Temp(Src) 98 F (36.7 C) (Oral)  Ht  (1.778 m)  Wt 206 lb (93.441 kg)  BMI 29.56 kg/m2  SpO2 97% Physical Exam  Constitutional: He is oriented to person, place, and time. He appears well-developed and well-nourished. No distress.  HENT:  Head: Normocephalic and atraumatic.  Right Ear: External ear normal.  Left Ear: External ear normal.  Nose: Nose normal.  Mouth/Throat: Oropharynx is clear and moist. No oropharyngeal exudate.  Eyes: Conjunctivae and EOM are normal. Pupils are equal, round, and reactive to light. Right eye exhibits no discharge. Left eye exhibits no discharge. No scleral icterus.  Neck: Normal range of motion. Neck supple. No tracheal deviation present. No thyromegaly present.  Cardiovascular: Normal rate, regular rhythm and normal heart sounds.  Exam reveals no gallop and no friction rub.   No murmur heard. Pulmonary/Chest: Effort normal and breath sounds normal. No accessory muscle usage. No tachypnea. No respiratory distress. He has no decreased breath sounds. He has no wheezes. He has no rhonchi. He has no rales. He exhibits no  tenderness.  Musculoskeletal: Normal range of motion. He exhibits no edema.  Lymphadenopathy:    He has no cervical adenopathy.  Neurological: He is alert and oriented to person, place, and time. No cranial nerve deficit. Coordination normal.  Skin: Skin is warm and dry. No rash noted. He is not diaphoretic. No erythema. No pallor.  Psychiatric: He has a normal mood and affect. His behavior is normal. Judgment and thought content normal.          Assessment & Plan:   Problem List Items Addressed This Visit      Unprioritized   Absolute anemia    Repeat CBC with labs today.  Relevant Orders   CBC with Differential/Platelet   Adjustment disorder with anxious mood    Symptoms well controlled with Lexapro 5mg  daily. Will continue.      Coronary artery disease involving native coronary artery with angina pectoris (HCC) - Primary (Chronic)    Symptomatically, doing well. Reviewed notes from CT surgery. Follow up with cardiology pending. Continue current medications. Cardiac rehab pending.      Essential hypertension (Chronic)    BP Readings from Last 3 Encounters:  05/18/15 98/59  05/16/15 98/60  04/20/15 123/70   BP on low side.  Dose of Lopressor was decreased to 12.5mg  bid.      Relevant Orders   Comprehensive metabolic panel       Return in about 3 months (around 08/18/2015) for Recheck.

## 2015-05-18 NOTE — Assessment & Plan Note (Signed)
BP Readings from Last 3 Encounters:  05/18/15 98/59  05/16/15 98/60  04/20/15 123/70   BP on low side.  Dose of Lopressor was decreased to 12.5mg  bid.

## 2015-05-18 NOTE — Progress Notes (Signed)
Pre visit review using our clinic review tool, if applicable. No additional management support is needed unless otherwise documented below in the visit note. 

## 2015-05-18 NOTE — Patient Instructions (Addendum)
Labs today.  Follow up in 3 months and sooner as needed. 

## 2015-05-18 NOTE — Assessment & Plan Note (Signed)
Repeat CBC with labs today. 

## 2015-05-18 NOTE — Assessment & Plan Note (Signed)
Symptoms well controlled with Lexapro 5mg  daily. Will continue.

## 2015-05-18 NOTE — Assessment & Plan Note (Signed)
Symptomatically, doing well. Reviewed notes from CT surgery. Follow up with cardiology pending. Continue current medications. Cardiac rehab pending.

## 2015-05-19 ENCOUNTER — Encounter: Payer: Self-pay | Admitting: *Deleted

## 2015-05-19 ENCOUNTER — Encounter: Payer: Medicare Other | Attending: Cardiovascular Disease | Admitting: *Deleted

## 2015-05-19 VITALS — BP 102/70

## 2015-05-19 DIAGNOSIS — Z951 Presence of aortocoronary bypass graft: Secondary | ICD-10-CM | POA: Insufficient documentation

## 2015-05-19 NOTE — Progress Notes (Signed)
Daily Session Note  Patient Details  Name: Narvel Kozub MRN: [id number] Date of Birth: 1946-03-20 Referring Provider:  Thayer Headings, MD  Encounter Date: 05/19/2015  Check In:     Session Check In - 05/19/15 1352    Check-In   Staff Present Gerlene Burdock RN, BSN;Steven Way BS, ACSM EP-C, Exercise Physiologist   Medication changes reported     No   Fall or balance concerns reported    No   VAD Patient? No   Pain Assessment   Currently in Pain? Yes   Pain Score 3    Pain Location Shoulder   Pain Orientation Right;Left   Pain Descriptors / Indicators Aching   Pain Onset In the past 7 days   Pain Frequency Occasional   Aggravating Factors  Laying on his shoulders   Pain Relieving Factors rest   Multiple Pain Sites Yes   2nd Pain Site   Pain Score 5   Pain Location Hip   Pain Orientation Right;Left   Pain Descriptors / Indicators Aching   Pain Type Chronic pain   Pain Onset More than a month ago   Pain Frequency Intermittent   Aggravating Factors  walking on an incline   Pain Relieving Factors Rest   Effect of Pain on Daily Activities Can't walk well on incline         Goals Met:  Proper associated with RPD/PD & O2 Sat No report of cardiac concerns or symptoms  Goals Unmet:  Not Applicable  Goals Comments: 6 minute walk test done in the hallway while hooked to the Cardiac Rehab telemetry system (revealed SR, ST).    Dr. Emily Filbert is Medical Director for [facility] and [facility].

## 2015-05-19 NOTE — Progress Notes (Signed)
Cardiac Individual Treatment Plan  Patient Details  Name: [name] MRN: [id number] Date of Birth: [birth year] Referring Provider:  Vesta Mixer, MD  Initial Encounter Date: Date: 05/19/15  Visit Diagnosis: S/P CABG x 4  Patient's Home Medications on Admission:  Current outpatient prescriptions:  .  acetaminophen (TYLENOL) 650 MG CR tablet, Take 650 mg by mouth every 8 (eight) hours as needed., Disp: , Rfl:  .  amiodarone (PACERONE) 200 MG tablet, Take 1 tablet (200 mg total) by mouth daily., Disp: 30 tablet, Rfl: 3 .  aspirin EC 81 MG EC tablet, Take 1 tablet (81 mg total) by mouth daily., Disp: , Rfl:  .  atorvastatin (LIPITOR) 40 MG tablet, Take 1 tablet (40 mg total) by mouth daily., Disp: 90 tablet, Rfl: 3 .  escitalopram (LEXAPRO) 5 MG tablet, Take 1 tablet (5 mg total) by mouth daily., Disp: 90 tablet, Rfl: 1 .  fexofenadine (ALLEGRA) 180 MG tablet, Take 180 mg by mouth daily., Disp: , Rfl:  .  finasteride (PROSCAR) 5 MG tablet, Take 1 tablet (5 mg total) by mouth daily., Disp: 90 tablet, Rfl: 3 .  furosemide (LASIX) 20 MG tablet, Take 1 tablet (20 mg total) by mouth daily as needed., Disp: 90 tablet, Rfl: 3 .  gabapentin (NEURONTIN) 400 MG capsule, Take 1 capsule (400 mg total) by mouth 3 (three) times daily., Disp: 270 capsule, Rfl: 3 .  metoprolol tartrate (LOPRESSOR) 25 MG tablet, Take 1 tablet (25 mg total) by mouth 2 (two) times daily. (Patient taking differently: Take 12.5 mg by mouth 2 (two) times daily. ), Disp: 60 tablet, Rfl: 3 .  Multiple Vitamin (MULTIVITAMIN) tablet, Take 1 tablet by mouth daily., Disp: , Rfl:  .  NITROSTAT 0.4 MG SL tablet, Place 0.4 mg under the tongue every 5 (five) minutes x 3 doses as needed. (CHEST PAIN), Disp: , Rfl:   Past Medical History: Past Medical History  Diagnosis Date  . Peripheral vascular disease (HCC)     Bilateral ABIs 0.8  . Chronic distal aortic occlusion (HCC)     By report  . Diabetes mellitus     By his report,  this is prediabetes   . SOB (shortness of breath)   . Hyperlipidemia LDL goal <70   . Essential hypertension   . Weakness   . Erectile dysfunction   . Hypogonadism in male     Previously on testosterone injections, but has not yet restarted until further testing  . Enlarged prostate   . Arthritis   . Depression   . History of kidney stones   . Muscle pain   . CAD, multiple vessel 03/2105    LM 65%, mLAD 70%, 90% & 80%, o-pCx 75%, mCx 95% & d Cx 70%, OM1 60%, mRCA 90% with 100% dRCA. R-R collaterals  . S/P CABG x 5 04/04/2015    LIMA to LAD, SVG to DIAG, SVG to OM, seqSVG -PDA- dCx  . PAF (paroxysmal atrial fibrillation) (HCC) 03/2015    Noted per-cath & post-op CABG.    Tobacco Use: History  Smoking status  . Former Smoker -- 2.00 packs/day for 40 years  . Types: Cigarettes  . Quit date: 10/17/1992  Smokeless tobacco  . Never Used    Comment: quit 1994    Labs: Recent Review Flowsheet Data    Labs for ITP Cardiac and Pulmonary Rehab Latest Ref Rng 04/04/2015 04/04/2015 04/04/2015 04/05/2015 04/06/2015   PHART 7.350 - 7.450 - 7.381 7.346(L) - 7.428   PCO2ART  35.0 - 45.0 mmHg - 39.8 42.1 - 33.2(L)   HCO3 20.0 - 24.0 mEq/L - 23.9 23.3 - 21.9   TCO2 0 - 100 mmol/L 20 25 25 20 23    ACIDBASEDEF 0.0 - 2.0 mmol/L - 1.0 3.0(H) - 2.0   O2SAT - - 91.0 91.0 - 90.0       Exercise Target Goals: Date: 05/19/15  Exercise Program Goal: Individual exercise prescription set with THRR, safety & activity barriers. Participant demonstrates ability to understand and report RPE using BORG scale, to self-measure pulse accurately, and to acknowledge the importance of the exercise prescription.  Exercise Prescription Goal: Starting with aerobic activity 30 plus minutes a day, 3 days per week for initial exercise prescription. Provide home exercise prescription and guidelines that participant acknowledges understanding prior to discharge.  Activity Barriers & Risk Stratification:     Activity  Barriers & Risk Stratification - 05/19/15 1343    Activity Barriers & Risk Stratification   Activity Barriers Arthritis;Other (comment)   Comments Hips hurt when walk on an incline. Goes up to a 5/10. Stopping and resting helps.    Risk Stratification High      6 Minute Walk:     6 Minute Walk      05/19/15 1439       6 Minute Walk   Phase Initial     Distance 1450 feet     Walk Time 6 minutes     Resting HR 75 bpm     Resting BP 102/60 mmHg     Max Ex. HR 94 bpm     Max Ex. BP 128/66 mmHg     RPE 13     Symptoms No        Initial Exercise Prescription:     Initial Exercise Prescription - 05/19/15 1400    Date of Initial Exercise Prescription   Date 05/19/15   Treadmill   MPH 2   Grade 0   Minutes 10   Recumbant Bike   Level 2   RPM 40   Watts 20   Minutes 10   NuStep   Level 2   Watts 40   Minutes 10   Arm Ergometer   Level 1   Watts 10   Minutes 10   REL-XR   Level 2   Watts 50   Minutes 10   Prescription Details   Frequency (times per week) 3   Duration Progress to 30 minutes of continuous aerobic without signs/symptoms of physical distress   Intensity   THRR REST +  30   Ratings of Perceived Exertion 11-15   Perceived Dyspnea 2-4   Progression Continue progressive overload as per policy without signs/symptoms or physical distress.   Resistance Training   Training Prescription Yes   Weight 2   Reps 10-15      Exercise Prescription Changes:   Discharge Exercise Prescription (Final Exercise Prescription Changes):   Nutrition:  Target Goals: Understanding of nutrition guidelines, daily intake of sodium 1500mg , cholesterol 200mg , calories 30% from fat and 7% or less from saturated fats, daily to have 5 or more servings of fruits and vegetables.  Biometrics:    Nutrition Therapy Plan and Nutrition Goals:     Nutrition Therapy & Goals - 05/19/15 1520    Personal Nutrition Goals   Personal Goal #1 Cont to eat like he learned in the  prediabetes class  [name] said he would prefer not to have an individual appt with the Cardiac Rehab registered  dietician since he has attended the prediabetes class before.    Intervention Plan   Intervention Using nutrition plan and personal goals to gain a healthy nutrition lifestyle. Add exercise as prescribed.      Nutrition Discharge: Rate Your Plate Scores:   Nutrition Goals Re-Evaluation:   Psychosocial: Target Goals: Acknowledge presence or absence of depression, maximize coping skills, provide positive support system. Participant is able to verbalize types and ability to use techniques and skills needed for reducing stress and depression.  Initial Review & Psychosocial Screening:     Initial Psych Review & Screening - 05/19/15 1522    Family Dynamics   Good Support System? Yes   Barriers   Psychosocial barriers to participate in program There are no identifiable barriers or psychosocial needs.   Screening Interventions   Interventions Encouraged to exercise      Quality of Life Scores:     Quality of Life - 05/19/15 1522    Quality of Life Scores   Health/Function Pre 17.61 %   Socioeconomic Pre 25.58 %   Psych/Spiritual Pre 20.86 %   Family Pre 28.8 %   GLOBAL Pre 21.56 %      PHQ-9:     Recent Review Flowsheet Data    Depression screen [facility] 2/9 05/19/2015   Decreased Interest 1   Down, Depressed, Hopeless 1   PHQ - 2 Score 2   Altered sleeping 2   Tired, decreased energy 2   Change in appetite 0   Feeling bad or failure about yourself  0   Trouble concentrating 0   Moving slowly or fidgety/restless 0   Suicidal thoughts 0   PHQ-9 Score 6   Difficult doing work/chores Not difficult at all      Psychosocial Evaluation and Intervention:   Psychosocial Re-Evaluation:   Vocational Rehabilitation: Provide vocational rehab assistance to qualifying candidates.   Vocational Rehab Evaluation & Intervention:     Vocational Rehab - 05/19/15 1352     Initial Vocational Rehab Evaluation & Intervention   Assessment shows need for Vocational Rehabilitation No      Education: Education Goals: Education classes will be provided on a weekly basis, covering required topics. Participant will state understanding/return demonstration of topics presented.  Learning Barriers/Preferences:     Learning Barriers/Preferences - 05/19/15 1352    Learning Barriers/Preferences   Learning Barriers None   Learning Preferences None      Education Topics: General Nutrition Guidelines/Fats and Fiber: -Group instruction provided by verbal, written material, models and posters to present the general guidelines for heart healthy nutrition. Gives an explanation and review of dietary fats and fiber.   Controlling Sodium/Reading Food Labels: -Group verbal and written material supporting the discussion of sodium use in heart healthy nutrition. Review and explanation with models, verbal and written materials for utilization of the food label.   Exercise Physiology & Risk Factors: - Group verbal and written instruction with models to review the exercise physiology of the cardiovascular system and associated critical values. Details cardiovascular disease risk factors and the goals associated with each risk factor.   Aerobic Exercise & Resistance Training: - Gives group verbal and written discussion on the health impact of inactivity. On the components of aerobic and resistive training programs and the benefits of this training and how to safely progress through these programs.   Flexibility, Balance, General Exercise Guidelines: - Provides group verbal and written instruction on the benefits of flexibility and balance training programs. Provides general exercise guidelines  with specific guidelines to those with heart or lung disease. Demonstration and skill practice provided.   Stress Management: - Provides group verbal and written instruction about the  health risks of elevated stress, cause of high stress, and healthy ways to reduce stress.   Depression: - Provides group verbal and written instruction on the correlation between heart/lung disease and depressed mood, treatment options, and the stigmas associated with seeking treatment.   Anatomy & Physiology of the Heart: - Group verbal and written instruction and models provide basic cardiac anatomy and physiology, with the coronary electrical and arterial systems. Review of: AMI, Angina, Valve disease, Heart Failure, Cardiac Arrhythmia, Pacemakers, and the ICD.   Cardiac Procedures: - Group verbal and written instruction and models to describe the testing methods done to diagnose heart disease. Reviews the outcomes of the test results. Describes the treatment choices: Medical Management, Angioplasty, or Coronary Bypass Surgery.   Cardiac Medications: - Group verbal and written instruction to review commonly prescribed medications for heart disease. Reviews the medication, class of the drug, and side effects. Includes the steps to properly store meds and maintain the prescription regimen.   Go Sex-Intimacy & Heart Disease, Get SMART - Goal Setting: - Group verbal and written instruction through game format to discuss heart disease and the return to sexual intimacy. Provides group verbal and written material to discuss and apply goal setting through the application of the S.M.A.R.T. Method.   Other Matters of the Heart: - Provides group verbal, written materials and models to describe Heart Failure, Angina, Valve Disease, and Diabetes in the realm of heart disease. Includes description of the disease process and treatment options available to the cardiac patient.   Exercise & Equipment Safety: - Individual verbal instruction and demonstration of equipment use and safety with use of the equipment.          [facility]   Date  05/19/15   Educator   C. EnterkinRN   Instruction Review Code  1- partially meets, needs review/practice      Infection Prevention: - Provides verbal and written material to individual with discussion of infection control including proper hand washing and proper equipment cleaning during exercise session.      [facility]   Date  05/19/15   Educator  C. Kairee Isa,RN   Instruction Review Code  2- meets goals/outcomes      Falls Prevention: - Provides verbal and written material to individual with discussion of falls prevention and safety.      [facility]   Date  05/19/15   Educator  C. Hedda Crumbley,RN   Instruction Review Code  2- meets goals/outcomes      Diabetes: - Individual verbal and written instruction to review signs/symptoms of diabetes, desired ranges of glucose level fasting, after meals and with exercise. Advice that pre and post exercise glucose checks will be done for 3 sessions at entry of program.      [facility]   Date  05/19/15   Educator  C. Ashok Sawaya,RN   Instruction Review Code  1- partially meets, needs review/practice       Knowledge Questionnaire Score:     Knowledge Questionnaire Score - 05/19/15 1518    Knowledge Questionnaire Score   Pre Score 21      Personal Goals and Risk Factors at Admission:     Personal Goals and  Risk Factors at Admission - 05/19/15 1522    Personal Goals and Risk Factors on Admission   Hypertension No   Lipids Yes   Goal Cholesterol controlled with medications as prescribed, with individualized exercise RX and with personalized nutrition plan. Value goals: LDL < , HDL > . Participant states understanding of desired cholesterol values and following prescriptions.   Intervention Provide nutrition & aerobic exercise along with prescribed medications to achieve LDL 70mg , HDL >40mg .      Personal Goals and Risk Factors  Review:    Personal Goals Discharge (Final Personal Goals and Risk Factors Review):     Comments: Ready to start Cardiac Rehab.

## 2015-05-19 NOTE — Progress Notes (Deleted)
Daily Session Note  Patient Details  Name: [name] MRN: [id number] Date of Birth: 1945/09/18 Referring Provider:  Thayer Headings, MD  Encounter Date: 05/19/2015  Check In:     Session Check In - 05/19/15 1352    Check-In   Staff Present Gerlene Burdock RN, BSN;Andersyn Fragoso BS, ACSM EP-C, Exercise Physiologist   Medication changes reported     No   Fall or balance concerns reported    No   VAD Patient? No   Pain Assessment   Currently in Pain? Yes   Pain Score 3    Pain Location Shoulder   Pain Orientation Right;Left   Pain Descriptors / Indicators Aching   Pain Onset In the past 7 days   Pain Frequency Occasional   Aggravating Factors  Laying on his shoulders   Pain Relieving Factors rest   Multiple Pain Sites Yes   2nd Pain Site   Pain Score 5   Pain Location Hip   Pain Orientation Right;Left   Pain Descriptors / Indicators Aching   Pain Type Chronic pain   Pain Onset More than a month ago   Pain Frequency Intermittent   Aggravating Factors  walking on an incline   Pain Relieving Factors Rest   Effect of Pain on Daily Activities Can't walk well on incline         Goals Met:  Proper associated with RPD/PD & O2 Sat Exercise tolerated well No report of cardiac concerns or symptoms Strength training completed today  Goals Unmet:  Not Applicable  Goals Comments:   Dr. Emily Filbert is Medical Director for [facility] and [facility].

## 2015-05-19 NOTE — Patient Instructions (Signed)
Patient Instructions  Patient Details  Name: [name] MRN: [id number] Date of Birth: 01-08-1946 Referring Provider:  Vesta MixerNahser, Philip J, MD  Below are the personal goals you chose as well as exercise and nutrition goals. Our goal is to help you keep on track towards obtaining and maintaining your goals. We will be discussing your progress on these goals with you throughout the program.  Initial Exercise Prescription:     Initial Exercise Prescription - 05/19/15 1400    Date of Initial Exercise Prescription   Date 05/19/15   Treadmill   MPH 2   Grade 0   Minutes 10   Recumbant Bike   Level 2   RPM 40   Watts 20   Minutes 10   NuStep   Level 2   Watts 40   Minutes 10   Arm Ergometer   Level 1   Watts 10   Minutes 10   REL-XR   Level 2   Watts 50   Minutes 10   Prescription Details   Frequency (times per week) 3   Duration Progress to 30 minutes of continuous aerobic without signs/symptoms of physical distress   Intensity   THRR REST +  30   Ratings of Perceived Exertion 11-15   Perceived Dyspnea 2-4   Progression Continue progressive overload as per policy without signs/symptoms or physical distress.   Resistance Training   Training Prescription Yes   Weight 2   Reps 10-15      Exercise Goals: Frequency: Be able to perform aerobic exercise three times per week working toward 3-5 days per week.  Intensity: Work with a perceived exertion of 11 (fairly light) - 15 (hard) as tolerated. Follow your new exercise prescription and watch for changes in prescription as you progress with the program. Changes will be reviewed with you when they are made.  Duration: You should be able to do 30 minutes of continuous aerobic exercise in addition to a 5 minute warm-up and a 5 minute cool-down routine.  Nutrition Goals: Your personal nutrition goals will be established when you do your nutrition analysis with the dietician.  The following are nutrition guidelines to  follow: Cholesterol < 200mg /day Sodium < 1500mg /day Fiber: Men over 50 yrs - 30 grams per day  Personal Goals:     Personal Goals and Risk Factors at Admission - 05/19/15 1522    Personal Goals and Risk Factors on Admission   Hypertension No   Lipids Yes   Goal Cholesterol controlled with medications as prescribed, with individualized exercise RX and with personalized nutrition plan. Value goals: LDL < 70mg , HDL > 40mg . Participant states understanding of desired cholesterol values and following prescriptions.   Intervention Provide nutrition & aerobic exercise along with prescribed medications to achieve LDL 70mg , HDL >40mg .      Tobacco Use Initial Evaluation: History  Smoking status  . Former Smoker -- 2.00 packs/day for 40 years  . Types: Cigarettes  . Quit date: 10/17/1992  Smokeless tobacco  . Never Used    Comment: quit 1994    Copy of goals given to participant.

## 2015-05-23 ENCOUNTER — Encounter: Payer: Medicare Other | Admitting: *Deleted

## 2015-05-23 ENCOUNTER — Telehealth: Payer: Self-pay | Admitting: *Deleted

## 2015-05-23 DIAGNOSIS — Z951 Presence of aortocoronary bypass graft: Secondary | ICD-10-CM

## 2015-05-23 LAB — GLUCOSE, CAPILLARY
Glucose-Capillary: 132 mg/dL — ABNORMAL HIGH (ref 65–99)
Glucose-Capillary: 97 mg/dL (ref 65–99)

## 2015-05-23 NOTE — Progress Notes (Signed)
Cardiac Individual Treatment Plan  Patient Details  Name: [name] MRN: [id number] Date of Birth: Nov 17, 1945 Referring Provider:  Vesta Mixer, MD  Initial Encounter Date:    Visit Diagnosis: S/P CABG x 4  Patient's Home Medications on Admission:  Current outpatient prescriptions:  .  acetaminophen (TYLENOL) 650 MG CR tablet, Take 650 mg by mouth every 8 (eight) hours as needed., Disp: , Rfl:  .  amiodarone (PACERONE) 200 MG tablet, Take 1 tablet (200 mg total) by mouth daily., Disp: 30 tablet, Rfl: 3 .  aspirin EC 81 MG EC tablet, Take 1 tablet (81 mg total) by mouth daily., Disp: , Rfl:  .  atorvastatin (LIPITOR) 40 MG tablet, Take 1 tablet (40 mg total) by mouth daily., Disp: 90 tablet, Rfl: 3 .  escitalopram (LEXAPRO) 5 MG tablet, Take 1 tablet (5 mg total) by mouth daily., Disp: 90 tablet, Rfl: 1 .  fexofenadine (ALLEGRA) 180 MG tablet, Take 180 mg by mouth daily., Disp: , Rfl:  .  finasteride (PROSCAR) 5 MG tablet, Take 1 tablet (5 mg total) by mouth daily., Disp: 90 tablet, Rfl: 3 .  furosemide (LASIX) 20 MG tablet, Take 1 tablet (20 mg total) by mouth daily as needed., Disp: 90 tablet, Rfl: 3 .  gabapentin (NEURONTIN) 400 MG capsule, Take 1 capsule (400 mg total) by mouth 3 (three) times daily., Disp: 270 capsule, Rfl: 3 .  metoprolol tartrate (LOPRESSOR) 25 MG tablet, Take 1 tablet (25 mg total) by mouth 2 (two) times daily. (Patient taking differently: Take 12.5 mg by mouth 2 (two) times daily. ), Disp: 60 tablet, Rfl: 3 .  Multiple Vitamin (MULTIVITAMIN) tablet, Take 1 tablet by mouth daily., Disp: , Rfl:  .  NITROSTAT 0.4 MG SL tablet, Place 0.4 mg under the tongue every 5 (five) minutes x 3 doses as needed. (CHEST PAIN), Disp: , Rfl:   Past Medical History: Past Medical History  Diagnosis Date  . Peripheral vascular disease (HCC)     Bilateral ABIs 0.8  . Chronic distal aortic occlusion (HCC)     By report  . Diabetes mellitus     By his report, this is  prediabetes   . SOB (shortness of breath)   . Hyperlipidemia LDL goal <70   . Essential hypertension   . Weakness   . Erectile dysfunction   . Hypogonadism in male     Previously on testosterone injections, but has not yet restarted until further testing  . Enlarged prostate   . Arthritis   . Depression   . History of kidney stones   . Muscle pain   . CAD, multiple vessel 03/2105    LM 65%, mLAD 70%, 90% & 80%, o-pCx 75%, mCx 95% & d Cx 70%, OM1 60%, mRCA 90% with 100% dRCA. R-R collaterals  . S/P CABG x 5 04/04/2015    LIMA to LAD, SVG to DIAG, SVG to OM, seqSVG -PDA- dCx  . PAF (paroxysmal atrial fibrillation) (HCC) 03/2015    Noted per-cath & post-op CABG.    Tobacco Use: History  Smoking status  . Former Smoker -- 2.00 packs/day for 40 years  . Types: Cigarettes  . Quit date: 10/17/1992  Smokeless tobacco  . Never Used    Comment: quit 1994    Labs: Recent Review Flowsheet Data    Labs for ITP Cardiac and Pulmonary Rehab Latest Ref Rng 04/04/2015 04/04/2015 04/04/2015 04/05/2015 04/06/2015   PHART 7.350 - 7.450 - 7.381 7.346(L) - 7.428   PCO2ART  35.0 - 45.0 mmHg - 39.8 42.1 - 33.2(L)   HCO3 20.0 - 24.0 mEq/L - 23.9 23.3 - 21.9   TCO2 0 - 100 mmol/L ACIDBASEDEF 0.0 - 2.0 mmol/L - 1.0 3.0(H) - 2.0   O2SAT - - 91.0 91.0 - 90.0       Exercise Target Goals:    Exercise Program Goal: Individual exercise prescription set with THRR, safety & activity barriers. Participant demonstrates ability to understand and report RPE using BORG scale, to self-measure pulse accurately, and to acknowledge the importance of the exercise prescription.  Exercise Prescription Goal: Starting with aerobic activity 30 plus minutes a day, 3 days per week for initial exercise prescription. Provide home exercise prescription and guidelines that participant acknowledges understanding prior to discharge.  Activity Barriers & Risk Stratification:     Activity Barriers & Risk  Stratification - 05/19/15 1343    Activity Barriers & Risk Stratification   Activity Barriers Arthritis;Other (comment)   Comments Hips hurt when walk on an incline. Goes up to a 5/10. Stopping and resting helps.    Risk Stratification High      6 Minute Walk:     6 Minute Walk      05/19/15 1439       6 Minute Walk   Phase Initial     Distance 1450 feet     Walk Time 6 minutes     Resting HR 75 bpm     Resting BP 102/60 mmHg     Max Ex. HR 94 bpm     Max Ex. BP 128/66 mmHg     RPE 13     Symptoms No        Initial Exercise Prescription:     Initial Exercise Prescription - 05/19/15 1400    Date of Initial Exercise Prescription   Date 05/19/15   Treadmill   MPH 2   Grade 0   Minutes 10   Recumbant Bike   Level 2   RPM 40   Watts 20   Minutes 10   NuStep   Level 2   Watts 40   Minutes 10   Arm Ergometer   Level 1   Watts 10   Minutes 10   REL-XR   Level 2   Watts 50   Minutes 10   Prescription Details   Frequency (times per week) 3   Duration Progress to 30 minutes of continuous aerobic without signs/symptoms of physical distress   Intensity   THRR REST +  30   Ratings of Perceived Exertion 11-15   Perceived Dyspnea 2-4   Progression Continue progressive overload as per policy without signs/symptoms or physical distress.   Resistance Training   Training Prescription Yes   Weight 2   Reps 10-15      Exercise Prescription Changes:   Discharge Exercise Prescription (Final Exercise Prescription Changes):   Nutrition:  Target Goals: Understanding of nutrition guidelines, daily intake of sodium 1500mg , cholesterol 200mg , calories 30% from fat and 7% or less from saturated fats, daily to have 5 or more servings of fruits and vegetables.  Biometrics:    Nutrition Therapy Plan and Nutrition Goals:     Nutrition Therapy & Goals - 05/19/15 1520    Personal Nutrition Goals   Personal Goal #1 Cont to eat like he learned in the prediabetes  class  [name] said he would prefer not to have an individual appt with the Cardiac Rehab registered  dietician since he has attended the prediabetes class before.    Intervention Plan   Intervention Using nutrition plan and personal goals to gain a healthy nutrition lifestyle. Add exercise as prescribed.      Nutrition Discharge: Rate Your Plate Scores:   Nutrition Goals Re-Evaluation:   Psychosocial: Target Goals: Acknowledge presence or absence of depression, maximize coping skills, provide positive support system. Participant is able to verbalize types and ability to use techniques and skills needed for reducing stress and depression.  Initial Review & Psychosocial Screening:     Initial Psych Review & Screening - 05/19/15 1522    Family Dynamics   Good Support System? Yes   Barriers   Psychosocial barriers to participate in program There are no identifiable barriers or psychosocial needs.   Screening Interventions   Interventions Encouraged to exercise      Quality of Life Scores:     Quality of Life - 05/19/15 1522    Quality of Life Scores   Health/Function Pre 17.61 %   Socioeconomic Pre 25.58 %   Psych/Spiritual Pre 20.86 %   Family Pre 28.8 %   GLOBAL Pre 21.56 %      PHQ-9:     Recent Review Flowsheet Data    Depression screen PHQ 2/9 05/19/2015   Decreased Interest 1   Down, Depressed, Hopeless 1   PHQ - 2 Score 2   Altered sleeping 2   Tired, decreased energy 2   Change in appetite 0   Feeling bad or failure about yourself  0   Trouble concentrating 0   Moving slowly or fidgety/restless 0   Suicidal thoughts 0   PHQ-9 Score 6   Difficult doing work/chores Not difficult at all      Psychosocial Evaluation and Intervention:   Psychosocial Re-Evaluation:     Psychosocial Re-Evaluation      05/23/15 1616           Psychosocial Re-Evaluation   Interventions Encouraged to attend Cardiac Rehabilitation for the exercise       Comments New  to Cardiac rehab.           Vocational Rehabilitation: Provide vocational rehab assistance to qualifying candidates.   Vocational Rehab Evaluation & Intervention:     Vocational Rehab - 05/19/15 1352    Initial Vocational Rehab Evaluation & Intervention   Assessment shows need for Vocational Rehabilitation No      Education: Education Goals: Education classes will be provided on a weekly basis, covering required topics. Participant will state understanding/return demonstration of topics presented.  Learning Barriers/Preferences:     Learning Barriers/Preferences - 05/19/15 1352    Learning Barriers/Preferences   Learning Barriers None   Learning Preferences None      Education Topics: General Nutrition Guidelines/Fats and Fiber: -Group instruction provided by verbal, written material, models and posters to present the general guidelines for heart healthy nutrition. Gives an explanation and review of dietary fats and fiber.   Controlling Sodium/Reading Food Labels: -Group verbal and written material supporting the discussion of sodium use in heart healthy nutrition. Review and explanation with models, verbal and written materials for utilization of the food label.   Exercise Physiology & Risk Factors: - Group verbal and written instruction with models to review the exercise physiology of the cardiovascular system and associated critical values. Details cardiovascular disease risk factors and the goals associated with each risk factor.   Aerobic Exercise & Resistance Training: - Gives group verbal and written discussion on  the health impact of inactivity. On the components of aerobic and resistive training programs and the benefits of this training and how to safely progress through these programs.          Cardiac Rehab from 05/23/2015 in [facility] Cardiac Rehab   Date  05/23/15   Educator  S. Way   Instruction Review Code  2- meets goals/outcomes      Flexibility,  Balance, General Exercise Guidelines: - Provides group verbal and written instruction on the benefits of flexibility and balance training programs. Provides general exercise guidelines with specific guidelines to those with heart or lung disease. Demonstration and skill practice provided.   Stress Management: - Provides group verbal and written instruction about the health risks of elevated stress, cause of high stress, and healthy ways to reduce stress.   Depression: - Provides group verbal and written instruction on the correlation between heart/lung disease and depressed mood, treatment options, and the stigmas associated with seeking treatment.   Anatomy & Physiology of the Heart: - Group verbal and written instruction and models provide basic cardiac anatomy and physiology, with the coronary electrical and arterial systems. Review of: AMI, Angina, Valve disease, Heart Failure, Cardiac Arrhythmia, Pacemakers, and the ICD.   Cardiac Procedures: - Group verbal and written instruction and models to describe the testing methods done to diagnose heart disease. Reviews the outcomes of the test results. Describes the treatment choices: Medical Management, Angioplasty, or Coronary Bypass Surgery.   Cardiac Medications: - Group verbal and written instruction to review commonly prescribed medications for heart disease. Reviews the medication, class of the drug, and side effects. Includes the steps to properly store meds and maintain the prescription regimen.   Go Sex-Intimacy & Heart Disease, Get SMART - Goal Setting: - Group verbal and written instruction through game format to discuss heart disease and the return to sexual intimacy. Provides group verbal and written material to discuss and apply goal setting through the application of the S.M.A.R.T. Method.   Other Matters of the Heart: - Provides group verbal, written materials and models to describe Heart Failure, Angina, Valve Disease,  and Diabetes in the realm of heart disease. Includes description of the disease process and treatment options available to the cardiac patient.   Exercise & Equipment Safety: - Individual verbal instruction and demonstration of equipment use and safety with use of the equipment.      Cardiac Rehab from 05/23/2015 in [facility] Cardiac Rehab   Date  05/19/15   Educator  C. EnterkinRN   Instruction Review Code  1- partially meets, needs review/practice      Infection Prevention: - Provides verbal and written material to individual with discussion of infection control including proper hand washing and proper equipment cleaning during exercise session.      Cardiac Rehab from 05/23/2015 in [facility] Cardiac Rehab   Date  05/19/15   Educator  C. Pierce Biagini,RN   Instruction Review Code  2- meets goals/outcomes      Falls Prevention: - Provides verbal and written material to individual with discussion of falls prevention and safety.      Cardiac Rehab from 05/23/2015 in [facility] Cardiac Rehab   Date  05/19/15   Educator  C. Jericha Bryden,RN   Instruction Review Code  2- meets goals/outcomes      Diabetes: - Individual verbal and written instruction to review signs/symptoms of diabetes, desired ranges of glucose level fasting, after meals and with exercise. Advice that pre and post exercise glucose checks will be done  for 3 sessions at entry of program.      Cardiac Rehab from 05/23/2015 in [facility] Cardiac Rehab   Date  05/19/15   Educator  C. Cianna Kasparian,RN   Instruction Review Code  1- partially meets, needs review/practice       Knowledge Questionnaire Score:     Knowledge Questionnaire Score - 05/19/15 1518    Knowledge Questionnaire Score   Pre Score 21      Personal Goals and Risk Factors at Admission:     Personal Goals and Risk Factors at Admission - 05/19/15 1522    Personal Goals and Risk Factors on Admission   Hypertension No   Lipids Yes   Goal Cholesterol controlled with medications as  prescribed, with individualized exercise RX and with personalized nutrition plan. Value goals: LDL < 70mg , HDL > 40mg . Participant states understanding of desired cholesterol values and following prescriptions.   Intervention Provide nutrition & aerobic exercise along with prescribed medications to achieve LDL 70mg , HDL >40mg .      Personal Goals and Risk Factors Review:      Goals and Risk Factor Review      05/23/15 1615           Increase Aerobic Exercise and Physical Activity   Goals Progress/Improvement seen  Yes       Comments Exercise a full session today.       Diabetes   Goal --  Stable today          Personal Goals Discharge (Final Personal Goals and Risk Factors Review):      Goals and Risk Factor Review - 05/23/15 1615    Increase Aerobic Exercise and Physical Activity   Goals Progress/Improvement seen  Yes   Comments Exercise a full session today.   Diabetes   Goal --  Stable today       Comments: First full exercise session in Cardiac Rehab today.

## 2015-05-23 NOTE — Progress Notes (Signed)
Daily Session Note  Patient Details  Name: [name] MRN: [id number] Date of Birth: 02/13/1946 Referring Provider:  Thayer Headings, MD  Encounter Date: 05/23/2015  Check In:     Session Check In - 05/23/15 1614    Check-In   Staff Present Heath Lark RN, BSN, CCRP;Ysmael Hires RN, BSN;Steven Way BS, ACSM EP-C, Exercise Physiologist   ER physicians immediately available to respond to emergencies See telemetry face sheet for immediately available ER MD   Medication changes reported     No   Fall or balance concerns reported    No   Warm-up and Cool-down Performed on first and last piece of equipment   Pain Assessment   Currently in Pain? Yes   Pain Score 1    Pain Location Shoulder   Pain Orientation Right;Left   Pain Descriptors / Indicators Aching   Pain Frequency Occasional         Goals Met:  Proper associated with RPD/PD & O2 Sat Exercise tolerated well No report of cardiac concerns or symptoms  Goals Unmet:  Not Applicable  Goals Comments: Session 2 today of Cardiac REhab.    Dr. Emily Filbert is Medical Director for [place] and [facility].

## 2015-05-23 NOTE — Progress Notes (Signed)
Cardiac Individual Treatment Plan  Patient Details  Name: [name] MRN: [id number] Date of Birth: Nov 17, 1945 Referring Provider:  Vesta Mixer, MD  Initial Encounter Date:    Visit Diagnosis: S/P CABG x 4  Patient's Home Medications on Admission:  Current outpatient prescriptions:  .  acetaminophen (TYLENOL) 650 MG CR tablet, Take 650 mg by mouth every 8 (eight) hours as needed., Disp: , Rfl:  .  amiodarone (PACERONE) 200 MG tablet, Take 1 tablet (200 mg total) by mouth daily., Disp: 30 tablet, Rfl: 3 .  aspirin EC 81 MG EC tablet, Take 1 tablet (81 mg total) by mouth daily., Disp: , Rfl:  .  atorvastatin (LIPITOR) 40 MG tablet, Take 1 tablet (40 mg total) by mouth daily., Disp: 90 tablet, Rfl: 3 .  escitalopram (LEXAPRO) 5 MG tablet, Take 1 tablet (5 mg total) by mouth daily., Disp: 90 tablet, Rfl: 1 .  fexofenadine (ALLEGRA) 180 MG tablet, Take 180 mg by mouth daily., Disp: , Rfl:  .  finasteride (PROSCAR) 5 MG tablet, Take 1 tablet (5 mg total) by mouth daily., Disp: 90 tablet, Rfl: 3 .  furosemide (LASIX) 20 MG tablet, Take 1 tablet (20 mg total) by mouth daily as needed., Disp: 90 tablet, Rfl: 3 .  gabapentin (NEURONTIN) 400 MG capsule, Take 1 capsule (400 mg total) by mouth 3 (three) times daily., Disp: 270 capsule, Rfl: 3 .  metoprolol tartrate (LOPRESSOR) 25 MG tablet, Take 1 tablet (25 mg total) by mouth 2 (two) times daily. (Patient taking differently: Take 12.5 mg by mouth 2 (two) times daily. ), Disp: 60 tablet, Rfl: 3 .  Multiple Vitamin (MULTIVITAMIN) tablet, Take 1 tablet by mouth daily., Disp: , Rfl:  .  NITROSTAT 0.4 MG SL tablet, Place 0.4 mg under the tongue every 5 (five) minutes x 3 doses as needed. (CHEST PAIN), Disp: , Rfl:   Past Medical History: Past Medical History  Diagnosis Date  . Peripheral vascular disease (HCC)     Bilateral ABIs 0.8  . Chronic distal aortic occlusion (HCC)     By report  . Diabetes mellitus     By his report, this is  prediabetes   . SOB (shortness of breath)   . Hyperlipidemia LDL goal <70   . Essential hypertension   . Weakness   . Erectile dysfunction   . Hypogonadism in male     Previously on testosterone injections, but has not yet restarted until further testing  . Enlarged prostate   . Arthritis   . Depression   . History of kidney stones   . Muscle pain   . CAD, multiple vessel 03/2105    LM 65%, mLAD 70%, 90% & 80%, o-pCx 75%, mCx 95% & d Cx 70%, OM1 60%, mRCA 90% with 100% dRCA. R-R collaterals  . S/P CABG x 5 04/04/2015    LIMA to LAD, SVG to DIAG, SVG to OM, seqSVG -PDA- dCx  . PAF (paroxysmal atrial fibrillation) (HCC) 03/2015    Noted per-cath & post-op CABG.    Tobacco Use: History  Smoking status  . Former Smoker -- 2.00 packs/day for 40 years  . Types: Cigarettes  . Quit date: 10/17/1992  Smokeless tobacco  . Never Used    Comment: quit 1994    Labs: Recent Review Flowsheet Data    Labs for ITP Cardiac and Pulmonary Rehab Latest Ref Rng 04/04/2015 04/04/2015 04/04/2015 04/05/2015 04/06/2015   PHART 7.350 - 7.450 - 7.381 7.346(L) - 7.428   PCO2ART  35.0 - 45.0 mmHg - 39.8 42.1 - 33.2(L)   HCO3 20.0 - 24.0 mEq/L - 23.9 23.3 - 21.9   TCO2 0 - 100 mmol/L ACIDBASEDEF 0.0 - 2.0 mmol/L - 1.0 3.0(H) - 2.0   O2SAT - - 91.0 91.0 - 90.0       Exercise Target Goals:    Exercise Program Goal: Individual exercise prescription set with THRR, safety & activity barriers. Participant demonstrates ability to understand and report RPE using BORG scale, to self-measure pulse accurately, and to acknowledge the importance of the exercise prescription.  Exercise Prescription Goal: Starting with aerobic activity 30 plus minutes a day, 3 days per week for initial exercise prescription. Provide home exercise prescription and guidelines that participant acknowledges understanding prior to discharge.  Activity Barriers & Risk Stratification:     Activity Barriers & Risk  Stratification - 05/19/15 1343    Activity Barriers & Risk Stratification   Activity Barriers Arthritis;Other (comment)   Comments Hips hurt when walk on an incline. Goes up to a 5/10. Stopping and resting helps.    Risk Stratification High      6 Minute Walk:     6 Minute Walk      05/19/15 1439       6 Minute Walk   Phase Initial     Distance 1450 feet     Walk Time 6 minutes     Resting HR 75 bpm     Resting BP 102/60 mmHg     Max Ex. HR 94 bpm     Max Ex. BP 128/66 mmHg     RPE 13     Symptoms No        Initial Exercise Prescription:     Initial Exercise Prescription - 05/19/15 1400    Date of Initial Exercise Prescription   Date 05/19/15   Treadmill   MPH 2   Grade 0   Minutes 10   Recumbant Bike   Level 2   RPM 40   Watts 20   Minutes 10   NuStep   Level 2   Watts 40   Minutes 10   Arm Ergometer   Level 1   Watts 10   Minutes 10   REL-XR   Level 2   Watts 50   Minutes 10   Prescription Details   Frequency (times per week) 3   Duration Progress to 30 minutes of continuous aerobic without signs/symptoms of physical distress   Intensity   THRR REST +  30   Ratings of Perceived Exertion 11-15   Perceived Dyspnea 2-4   Progression Continue progressive overload as per policy without signs/symptoms or physical distress.   Resistance Training   Training Prescription Yes   Weight 2   Reps 10-15      Exercise Prescription Changes:   Discharge Exercise Prescription (Final Exercise Prescription Changes):   Nutrition:  Target Goals: Understanding of nutrition guidelines, daily intake of sodium 1500mg , cholesterol 200mg , calories 30% from fat and 7% or less from saturated fats, daily to have 5 or more servings of fruits and vegetables.  Biometrics:    Nutrition Therapy Plan and Nutrition Goals:     Nutrition Therapy & Goals - 05/19/15 1520    Personal Nutrition Goals   Personal Goal #1 Cont to eat like he learned in the prediabetes  class  Leman said he would prefer not to have an individual appt with the Cardiac Rehab registered  dietician since he has attended the prediabetes class before.    Intervention Plan   Intervention Using nutrition plan and personal goals to gain a healthy nutrition lifestyle. Add exercise as prescribed.      Nutrition Discharge: Rate Your Plate Scores:   Nutrition Goals Re-Evaluation:     Nutrition Goals Re-Evaluation      05/23/15 1805           Personal Goal #1 Re-Evaluation   Goal Progress Seen Yes          Psychosocial: Target Goals: Acknowledge presence or absence of depression, maximize coping skills, provide positive support system. Participant is able to verbalize types and ability to use techniques and skills needed for reducing stress and depression.  Initial Review & Psychosocial Screening:     Initial Psych Review & Screening - 05/19/15 1522    Family Dynamics   Good Support System? Yes   Barriers   Psychosocial barriers to participate in program There are no identifiable barriers or psychosocial needs.   Screening Interventions   Interventions Encouraged to exercise      Quality of Life Scores:     Quality of Life - 05/19/15 1522    Quality of Life Scores   Health/Function Pre 17.61 %   Socioeconomic Pre 25.58 %   Psych/Spiritual Pre 20.86 %   Family Pre 28.8 %   GLOBAL Pre 21.56 %      PHQ-9:     Recent Review Flowsheet Data    Depression screen PHQ 2/9 05/19/2015   Decreased Interest 1   Down, Depressed, Hopeless 1   PHQ - 2 Score 2   Altered sleeping 2   Tired, decreased energy 2   Change in appetite 0   Feeling bad or failure about yourself  0   Trouble concentrating 0   Moving slowly or fidgety/restless 0   Suicidal thoughts 0   PHQ-9 Score 6   Difficult doing work/chores Not difficult at all      Psychosocial Evaluation and Intervention:   Psychosocial Re-Evaluation:     Psychosocial Re-Evaluation      05/23/15 1616            Psychosocial Re-Evaluation   Interventions Encouraged to attend Cardiac Rehabilitation for the exercise       Comments New to Cardiac rehab.           Vocational Rehabilitation: Provide vocational rehab assistance to qualifying candidates.   Vocational Rehab Evaluation & Intervention:     Vocational Rehab - 05/19/15 1352    Initial Vocational Rehab Evaluation & Intervention   Assessment shows need for Vocational Rehabilitation No      Education: Education Goals: Education classes will be provided on a weekly basis, covering required topics. Participant will state understanding/return demonstration of topics presented.  Learning Barriers/Preferences:     Learning Barriers/Preferences - 05/19/15 1352    Learning Barriers/Preferences   Learning Barriers None   Learning Preferences None      Education Topics: General Nutrition Guidelines/Fats and Fiber: -Group instruction provided by verbal, written material, models and posters to present the general guidelines for heart healthy nutrition. Gives an explanation and review of dietary fats and fiber.   Controlling Sodium/Reading Food Labels: -Group verbal and written material supporting the discussion of sodium use in heart healthy nutrition. Review and explanation with models, verbal and written materials for utilization of the food label.   Exercise Physiology & Risk Factors: - Group verbal and written instruction with models  to review the exercise physiology of the cardiovascular system and associated critical values. Details cardiovascular disease risk factors and the goals associated with each risk factor.   Aerobic Exercise & Resistance Training: - Gives group verbal and written discussion on the health impact of inactivity. On the components of aerobic and resistive training programs and the benefits of this training and how to safely progress through these programs.          [facility]   Date  05/23/15   Educator  S. Way   Instruction Review Code  2- meets goals/outcomes      Flexibility, Balance, General Exercise Guidelines: - Provides group verbal and written instruction on the benefits of flexibility and balance training programs. Provides general exercise guidelines with specific guidelines to those with heart or lung disease. Demonstration and skill practice provided.   Stress Management: - Provides group verbal and written instruction about the health risks of elevated stress, cause of high stress, and healthy ways to reduce stress.   Depression: - Provides group verbal and written instruction on the correlation between heart/lung disease and depressed mood, treatment options, and the stigmas associated with seeking treatment.   Anatomy & Physiology of the Heart: - Group verbal and written instruction and models provide basic cardiac anatomy and physiology, with the coronary electrical and arterial systems. Review of: AMI, Angina, Valve disease, Heart Failure, Cardiac Arrhythmia, Pacemakers, and the ICD.   Cardiac Procedures: - Group verbal and written instruction and models to describe the testing methods done to diagnose heart disease. Reviews the outcomes of the test results. Describes the treatment choices: Medical Management, Angioplasty, or Coronary Bypass Surgery.   Cardiac Medications: - Group verbal and written instruction to review commonly prescribed medications for heart disease. Reviews the medication, class of the drug, and side effects. Includes the steps to properly store meds and maintain the prescription regimen.   Go Sex-Intimacy & Heart Disease, Get SMART - Goal Setting: - Group verbal and written instruction through game format to discuss heart disease and the return to sexual intimacy. Provides group verbal and written material to discuss and apply goal setting through the application of the S.M.A.R.T.  Method.   Other Matters of the Heart: - Provides group verbal, written materials and models to describe Heart Failure, Angina, Valve Disease, and Diabetes in the realm of heart disease. Includes description of the disease process and treatment options available to the cardiac patient.   Exercise & Equipment Safety: - Individual verbal instruction and demonstration of equipment use and safety with use of the equipment.      [facility]   Date  05/19/15   Educator  C. EnterkinRN   Instruction Review Code  1- partially meets, needs review/practice      Infection Prevention: - Provides verbal and written material to individual with discussion of infection control including proper hand washing and proper equipment cleaning during exercise session.      [facility]   Date  05/19/15   Educator  C. Tel Hevia,RN   Instruction Review Code  2- meets goals/outcomes      Falls Prevention: - Provides verbal and written material to individual with discussion of falls prevention and safety.      [facility]   Date  05/19/15   Educator  C. Valary Manahan,RN   Instruction Review Code  2-  meets goals/outcomes      Diabetes: - Individual verbal and written instruction to review signs/symptoms of diabetes, desired ranges of glucose level fasting, after meals and with exercise. Advice that pre and post exercise glucose checks will be done for 3 sessions at entry of program.      Cardiac Rehab from 05/23/2015 in [facility]   Date  05/19/15   Educator  C. Estela Vinal,RN   Instruction Review Code  1- partially meets, needs review/practice       Knowledge Questionnaire Score:     Knowledge Questionnaire Score - 05/19/15 1518    Knowledge Questionnaire Score   Pre Score 21      Personal Goals and Risk Factors at Admission:     Personal Goals and Risk Factors at Admission -  05/19/15 1522    Personal Goals and Risk Factors on Admission   Hypertension No   Lipids Yes   Goal Cholesterol controlled with medications as prescribed, with individualized exercise RX and with personalized nutrition plan. Value goals: LDL < , HDL > . Participant states understanding of desired cholesterol values and following prescriptions.   Intervention Provide nutrition & aerobic exercise along with prescribed medications to achieve LDL 70mg , HDL >40mg .      Personal Goals and Risk Factors Review:      Goals and Risk Factor Review      05/23/15 1615 05/23/15 1805         Increase Aerobic Exercise and Physical Activity   Goals Progress/Improvement seen  Yes Yes      Comments Exercise a full session today. Sundiata did well on the treadmill today. He liked the recumbent bike he exercised on today.       Diabetes   Goal --  Stable today Blood glucose control identified by blood glucose values, HgbA1C. Participant verbalizes understanding of the signs/symptoms of hyper/hypo glycemia, proper foot care and importance of medication and nutrition plan for blood glucose control.  Good blood sugars today.          Personal Goals Discharge (Final Personal Goals and Risk Factors Review):      Goals and Risk Factor Review - 05/23/15 1805    Increase Aerobic Exercise and Physical Activity   Goals Progress/Improvement seen  Yes   Comments Braelin did well on the treadmill today. He liked the recumbent bike he exercised on today.    Diabetes   Goal Blood glucose control identified by blood glucose values, HgbA1C. Participant verbalizes understanding of the signs/symptoms of hyper/hypo glycemia, proper foot care and importance of medication and nutrition plan for blood glucose control.  Good blood sugars today.        Comments: Vontae liked the recumbent bike he exercised on today!

## 2015-05-23 NOTE — Telephone Encounter (Signed)
Pt c/o BP issue: STAT if pt c/o blurred vision, one-sided weakness or slurred speech  1. What are your last 5 BP readings? 05/21/15 am 98/59 pm , 05/22/15 am 100/60    2. Are you having any other symptoms (ex. Dizziness, headache, blurred vision, passed out)? Sob and very tired feeling and depression  3. What is your BP issue? bp very low     04/04/15 had cabg surgery

## 2015-05-23 NOTE — Telephone Encounter (Signed)
S/w pt who reports generalized weakness/tired and the following BPs: 11/5  98/59  11/6  100/60  He had CABG 9/19 States he is taking metoprolol as directed, 12.5mg  BID and has not needed lasix. Reports he has not been active but begins Cardiac Rehab this afternoon. He understands that he could be weak from surgery and will see how he feels after exercising today. Reviewed when metoprolol should be held and advised pt to monitor BP BID this week and report findings to us. Advised pt to move around more at home Denies any other symptoms.  Pt agreeable w/plan and will call later this week with readings.

## 2015-05-25 ENCOUNTER — Other Ambulatory Visit: Payer: Self-pay | Admitting: *Deleted

## 2015-05-25 ENCOUNTER — Encounter: Payer: Medicare Other | Admitting: *Deleted

## 2015-05-25 DIAGNOSIS — Z951 Presence of aortocoronary bypass graft: Secondary | ICD-10-CM

## 2015-05-25 LAB — GLUCOSE, CAPILLARY
Glucose-Capillary: 144 mg/dL — ABNORMAL HIGH (ref 65–99)
Glucose-Capillary: 100 mg/dL — ABNORMAL HIGH (ref 65–99)

## 2015-05-25 NOTE — Progress Notes (Signed)
Daily Session Note  Patient Details  Name: [name] MRN: [id number] Date of Birth: 23-Jul-1945 Referring Provider:  Thayer Headings, MD  Encounter Date: 05/25/2015  Check In:     Session Check In - 05/25/15 1621    Check-In   Staff Present Gerlene Burdock RN, BSN;Renee Dillard Essex MS, ACSM CEP Exercise Physiologist;Diane Mariana Arn, BSN   ER physicians immediately available to respond to emergencies See telemetry face sheet for immediately available ER MD   Medication changes reported     No   Fall or balance concerns reported    No   Warm-up and Cool-down Performed on first and last piece of equipment   VAD Patient? No   Pain Assessment   Currently in Pain? No/denies           Exercise Prescription Changes - 05/25/15 1600    Exercise Review   Progression Yes   Treadmill   MPH 2.5   Grade 0   Minutes 20   Recumbant Bike   Level 3   RPM 60      Goals Met:  Proper associated with RPD/PD & O2 Sat Exercise tolerated well  Goals Unmet:  Not Applicable  Goals Comments:    Dr. Emily Filbert is Medical Director for [facility] and [facility].

## 2015-05-26 DIAGNOSIS — Z951 Presence of aortocoronary bypass graft: Secondary | ICD-10-CM

## 2015-05-26 LAB — GLUCOSE, CAPILLARY
Glucose-Capillary: 122 mg/dL — ABNORMAL HIGH (ref 65–99)
Glucose-Capillary: 108 mg/dL — ABNORMAL HIGH (ref 65–99)

## 2015-05-26 NOTE — Progress Notes (Signed)
Daily Session Note  Patient Details  Name: [name] MRN: [id number] Date of Birth: 11/20/1945 Referring Provider:  Nahser, Philip J, MD  Encounter Date: 05/26/2015  Check In:     Session Check In - 05/26/15 1632    Check-In   Staff Present Steven Way BS, ACSM EP-C, Exercise Physiologist;Carroll Enterkin RN, BSN;Diane Wright RN, BSN   ER physicians immediately available to respond to emergencies See telemetry face sheet for immediately available ER MD   Medication changes reported     No   Fall or balance concerns reported    No   Warm-up and Cool-down Performed on first and last piece of equipment   Pain Assessment   Currently in Pain? No/denies         Goals Met:  Proper associated with RPD/PD & O2 Sat Exercise tolerated well No report of cardiac concerns or symptoms Strength training completed today  Goals Unmet:  Not Applicable  Goals Comments:    Dr. Mark Miller is Medical Director for [facility] and [facility]. 

## 2015-05-27 ENCOUNTER — Telehealth: Payer: Self-pay | Admitting: Cardiology

## 2015-05-27 NOTE — Telephone Encounter (Signed)
Pt says he was to call back and give you his blood pressure readings.

## 2015-05-27 NOTE — Telephone Encounter (Signed)
Patient called back. He states he was to calling back with blood reading. RN informed patient he spoke to another nurse last week. 05/24/15 Tuesday 112/80  Evening  110/80  Wednesday 100/60   Evening  90/60  Thursday  80/90    Evening 100/60 Informed patient that blood pressure was stable. Continue with metoprolol tart. 12.5 mg twice a day  patient may hold dose for if dizzy and take the next dose at regular time.  Patient request seeing Dr Herbie BaltimoreHarding sooner. [facility]

## 2015-05-28 NOTE — Telephone Encounter (Signed)
I think for now - would just hold Metoprolol all together - is on Amiodarone.  Marykay LexHARDING, DAVID W, MD

## 2015-05-30 ENCOUNTER — Encounter: Payer: Medicare Other | Admitting: *Deleted

## 2015-05-30 DIAGNOSIS — Z951 Presence of aortocoronary bypass graft: Secondary | ICD-10-CM | POA: Diagnosis not present

## 2015-05-30 NOTE — Addendum Note (Signed)
Addended by: Tobin ChadMARTIN, Cortlyn Cannell V. on: 05/30/2015 09:08 AM   Modules accepted: Medications

## 2015-05-30 NOTE — Progress Notes (Signed)
Daily Session Note  Patient Details  Name: Rein Popov MRN: [id number] Date of Birth: 14-Feb-1946 Referring Provider:  Thayer Headings, MD  Encounter Date: 05/30/2015  Check In:     Session Check In - 05/30/15 1617    Check-In   Staff Present Heath Lark RN, BSN, CCRP;Carroll Enterkin RN, BSN;Steven Way BS, ACSM EP-C, Exercise Physiologist   ER physicians immediately available to respond to emergencies See telemetry face sheet for immediately available ER MD   Medication changes reported     No   Fall or balance concerns reported    No   Warm-up and Cool-down Performed on first and last piece of equipment   VAD Patient? No   Pain Assessment   Currently in Pain? No/denies         Goals Met:  Independence with exercise equipment Exercise tolerated well No report of cardiac concerns or symptoms  Goals Unmet:  Not Applicable  Goals Comments: Doing well with exercise prescription progression.    Dr. Emily Filbert is Medical Director for [place] and [facility].

## 2015-05-30 NOTE — Telephone Encounter (Signed)
Spoke to patient Information given to hold metoprolol for now , but continue all other medications per Dr Herbie BaltimoreHarding. Patient verbalized understanding.

## 2015-06-01 ENCOUNTER — Encounter: Payer: Medicare Other | Admitting: *Deleted

## 2015-06-01 DIAGNOSIS — Z951 Presence of aortocoronary bypass graft: Secondary | ICD-10-CM

## 2015-06-01 NOTE — Progress Notes (Signed)
Daily Session Note  Patient Details  Name: [name] MRN: [id number] Date of Birth: [birth year] Referring Provider:  Thayer Headings, MD  Encounter Date: 06/01/2015  Check In:     Session Check In - 06/01/15 1616    Check-In   Staff Present Candiss Norse MS, ACSM CEP Exercise Physiologist;Carroll Enterkin RN, BSN;Diane Joya Gaskins RN, BSN   ER physicians immediately available to respond to emergencies See telemetry face sheet for immediately available ER MD   Medication changes reported     No   Fall or balance concerns reported    No   Warm-up and Cool-down Performed on first and last piece of equipment   VAD Patient? No   Pain Assessment   Currently in Pain? No/denies   Multiple Pain Sites No           Exercise Prescription Changes - 06/01/15 1600    Exercise Review   Progression Yes   Response to Exercise   Symptoms None   Comments Reviewed individualized exercise prescription and made increases per departmental policy. Exercise increases were discussed with the patient and they were able to perform the new work loads without issue (no signs or symptoms).    Duration Progress to 30 minutes of continuous aerobic without signs/symptoms of physical distress   Intensity Rest + 30   Progression Continue progressive overload as per policy without signs/symptoms or physical distress.   Resistance Training   Training Prescription Yes   Weight 4   Reps 10-15   Interval Training   Interval Training No   Treadmill   MPH 3   Grade 0   Minutes 20   Recumbant Bike   Level 3   RPM 60   Minutes 20      Goals Met:  Independence with exercise equipment Exercise tolerated well Personal goals reviewed No report of cardiac concerns or symptoms Strength training completed today  Goals Unmet:  Not Applicable  Goals Comments: Patient completed exercise prescription and all exercise goals during rehab session. The exercise was tolerated well and the patient is progressing in  the program.    Dr. Emily Filbert is Medical Director for [place] and [facility].

## 2015-06-02 DIAGNOSIS — Z951 Presence of aortocoronary bypass graft: Secondary | ICD-10-CM

## 2015-06-02 NOTE — Progress Notes (Signed)
Cardiac Individual Treatment Plan  Patient Details  Name: [name] MRN: [id number] Date of Birth: 07-12-1946 Referring Provider:  Thayer Headings, MD  Initial Encounter Date:  05/19/2015  Visit Diagnosis: S/P CABG x 4  Patient's Home Medications on Admission:  Current outpatient prescriptions:  .  acetaminophen (TYLENOL) 650 MG CR tablet, Take 650 mg by mouth every 8 (eight) hours as needed., Disp: , Rfl:  .  amiodarone (PACERONE) 200 MG tablet, Take 1 tablet (200 mg total) by mouth daily., Disp: 30 tablet, Rfl: 3 .  aspirin EC 81 MG EC tablet, Take 1 tablet (81 mg total) by mouth daily., Disp: , Rfl:  .  atorvastatin (LIPITOR) 40 MG tablet, Take 1 tablet (40 mg total) by mouth daily., Disp: 90 tablet, Rfl: 3 .  escitalopram (LEXAPRO) 5 MG tablet, Take 1 tablet (5 mg total) by mouth daily., Disp: 90 tablet, Rfl: 1 .  fexofenadine (ALLEGRA) 180 MG tablet, Take 180 mg by mouth daily., Disp: , Rfl:  .  finasteride (PROSCAR) 5 MG tablet, Take 1 tablet (5 mg total) by mouth daily., Disp: 90 tablet, Rfl: 3 .  furosemide (LASIX) 20 MG tablet, Take 1 tablet (20 mg total) by mouth daily as needed., Disp: 90 tablet, Rfl: 3 .  gabapentin (NEURONTIN) 400 MG capsule, Take 1 capsule (400 mg total) by mouth 3 (three) times daily., Disp: 270 capsule, Rfl: 3 .  metoprolol tartrate (LOPRESSOR) 25 MG tablet, Take 1 tablet (25 mg total) by mouth 2 (two) times daily. (Patient taking differently: Take 12.5 mg by mouth 2 (two) times daily. ), Disp: 60 tablet, Rfl: 3 .  Multiple Vitamin (MULTIVITAMIN) tablet, Take 1 tablet by mouth daily., Disp: , Rfl:  .  NITROSTAT 0.4 MG SL tablet, Place 0.4 mg under the tongue every 5 (five) minutes x 3 doses as needed. (CHEST PAIN), Disp: , Rfl:   Past Medical History: Past Medical History  Diagnosis Date  . Peripheral vascular disease (HCC)     Bilateral ABIs 0.8  . Chronic distal aortic occlusion (HCC)     By report  . Diabetes mellitus     By his report, this is  prediabetes   . SOB (shortness of breath)   . Hyperlipidemia LDL goal <70   . Essential hypertension   . Weakness   . Erectile dysfunction   . Hypogonadism in male     Previously on testosterone injections, but has not yet restarted until further testing  . Enlarged prostate   . Arthritis   . Depression   . History of kidney stones   . Muscle pain   . CAD, multiple vessel 03/2105    LM 65%, mLAD 70%, 90% & 80%, o-pCx 75%, mCx 95% & d Cx 70%, OM1 60%, mRCA 90% with 100% dRCA. R-R collaterals  . S/P CABG x 5 04/04/2015    LIMA to LAD, SVG to DIAG, SVG to OM, seqSVG -PDA- dCx  . PAF (paroxysmal atrial fibrillation) ([place]) 03/2015    Noted per-cath & post-op CABG.    Tobacco Use: History  Smoking status  . Former Smoker -- 2.00 packs/day for 40 years  . Types: Cigarettes  . Quit date: 10/17/1992  Smokeless tobacco  . Never Used    Comment: quit 1994    Labs: Recent Review Flowsheet Data    Labs for ITP Cardiac and Pulmonary Rehab Latest Ref Rng 04/04/2015 04/04/2015 04/04/2015 04/05/2015 04/06/2015   PHART 7.350 - 7.450 - 7.381 7.346(L) - 7.428   PCO2ART  35.0 - 45.0 mmHg - 39.8 42.1 - 33.2(L)   HCO3 20.0 - 24.0 mEq/L - 23.9 23.3 - 21.9   TCO2 0 - 100 mmol/L _0 ACIDBASEDEF 0.0 - 2.0 mmol/L - 1.0 3.0(H) - 2.0   O2SAT - - 91.0 91.0 - 90.0       Exercise Target Goals:    Exercise Program Goal: Individual exercise prescription set with THRR, safety & activity barriers. Participant demonstrates ability to understand and report RPE using BORG scale, to self-measure pulse accurately, and to acknowledge the importance of the exercise prescription.  Exercise Prescription Goal: Starting with aerobic activity 30 plus minutes a day, 3 days per week for initial exercise prescription. Provide home exercise prescription and guidelines that participant acknowledges understanding prior to discharge.  Activity Barriers & Risk Stratification:     Activity Barriers & Risk  Stratification - 05/19/15 1343    Activity Barriers & Risk Stratification   Activity Barriers Arthritis;Other (comment)   Comments Hips hurt when walk on an incline. Goes up to a 5/10. Stopping and resting helps.    Risk Stratification High      6 Minute Walk:     6 Minute Walk      05/19/15 1439       6 Minute Walk   Phase Initial     Distance 1450 feet     Walk Time 6 minutes     Resting HR 75 bpm     Resting BP 102/60 mmHg     Max Ex. HR 94 bpm     Max Ex. BP 128/66 mmHg     RPE 13     Symptoms No        Initial Exercise Prescription:     Initial Exercise Prescription - 05/19/15 1400    Date of Initial Exercise Prescription   Date 05/19/15   Treadmill   MPH 2   Grade 0   Minutes 10   Recumbant Bike   Level 2   RPM 40   Watts 20   Minutes 10   NuStep   Level 2   Watts 40   Minutes 10   Arm Ergometer   Level 1   Watts 10   Minutes 10   REL-XR   Level 2   Watts 50   Minutes 10   Prescription Details   Frequency (times per week) 3   Duration Progress to 30 minutes of continuous aerobic without signs/symptoms of physical distress   Intensity   THRR REST +  30   Ratings of Perceived Exertion 11-15   Perceived Dyspnea 2-4   Progression Continue progressive overload as per policy without signs/symptoms or physical distress.   Resistance Training   Training Prescription Yes   Weight 2   Reps 10-15      Exercise Prescription Changes:     Exercise Prescription Changes      05/25/15 1600 06/01/15 1600         Exercise Review   Progression Yes Yes      Response to Exercise   Symptoms  None      Comments  Reviewed individualized exercise prescription and made increases per departmental policy. Exercise increases were discussed with the patient and they were able to perform the new work loads without issue (no signs or symptoms).       Duration  Progress to 30 minutes of continuous aerobic without signs/symptoms of physical distress  Intensity  Rest + 30      Progression  Continue progressive overload as per policy without signs/symptoms or physical distress.      Resistance Training   Training Prescription  Yes      Weight  4      Reps  10-15      Interval Training   Interval Training  No      Treadmill   MPH 2.5 3      Grade 0 0      Minutes 20 20      Recumbant Bike   Level 3 3      RPM 60 60      Minutes  20         Discharge Exercise Prescription (Final Exercise Prescription Changes):     Exercise Prescription Changes - 06/01/15 1600    Exercise Review   Progression Yes   Response to Exercise   Symptoms None   Comments Reviewed individualized exercise prescription and made increases per departmental policy. Exercise increases were discussed with the patient and they were able to perform the new work loads without issue (no signs or symptoms).    Duration Progress to 30 minutes of continuous aerobic without signs/symptoms of physical distress   Intensity Rest + 30   Progression Continue progressive overload as per policy without signs/symptoms or physical distress.   Resistance Training   Training Prescription Yes   Weight 4   Reps 10-15   Interval Training   Interval Training No   Treadmill   MPH 3   Grade 0   Minutes 20   Recumbant Bike   Level 3   RPM 60   Minutes 20      Nutrition:  Target Goals: Understanding of nutrition guidelines, daily intake of sodium <1578m, cholesterol <2072m calories 30% from fat and 7% or less from saturated fats, daily to have 5 or more servings of fruits and vegetables.  Biometrics:    Nutrition Therapy Plan and Nutrition Goals:     Nutrition Therapy & Goals - 05/19/15 1520    Personal Nutrition Goals   Personal Goal #1 Cont to eat like he learned in the prediabetes class  FrArchibaldaid he would prefer not to have an individual appt with the Cardiac Rehab registered dietician since he has attended the prediabetes class before.    Intervention Plan    Intervention Using nutrition plan and personal goals to gain a healthy nutrition lifestyle. Add exercise as prescribed.      Nutrition Discharge: Rate Your Plate Scores:   Nutrition Goals Re-Evaluation:     Nutrition Goals Re-Evaluation      05/23/15 1805 06/02/15 1618         Personal Goal #1 Re-Evaluation   Goal Progress Seen Yes No      Comments  I asked FrTheophilef he wanted to see the Cardiac Rehab dietician and he told me again he wanted to wait to check with his wife who needs to lose weight. He said his wife got mad at him today since he ate [employer] [place]         Psychosocial: Target Goals: Acknowledge presence or absence of depression, maximize coping skills, provide positive support system. Participant is able to verbalize types and ability to use techniques and skills needed for reducing stress and depression.  Initial Review & Psychosocial Screening:     Initial Psych Review & Screening - 05/19/15 1522  Family Dynamics   Good Support System? Yes   Barriers   Psychosocial barriers to participate in program There are no identifiable barriers or psychosocial needs.   Screening Interventions   Interventions Encouraged to exercise      Quality of Life Scores:     Quality of Life - 05/19/15 1522    Quality of Life Scores   Health/Function Pre 17.61 %   Socioeconomic Pre 25.58 %   Psych/Spiritual Pre 20.86 %   Family Pre 28.8 %   GLOBAL Pre 21.56 %      PHQ-9:     Recent Review Flowsheet Data    Depression screen [facility] 2/9 05/19/2015   Decreased Interest 1   Down, Depressed, Hopeless 1   PHQ - 2 Score 2   Altered sleeping 2   Tired, decreased energy 2   Change in appetite 0   Feeling bad or failure about yourself  0   Trouble concentrating 0   Moving slowly or fidgety/restless 0   Suicidal thoughts 0   PHQ-9 Score 6   Difficult doing work/chores Not difficult at all      Psychosocial Evaluation and Intervention:      Psychosocial Evaluation - 05/25/15 1717    Psychosocial Evaluation & Interventions   Interventions Stress management education;Relaxation education;Encouraged to exercise with the program and follow exercise prescription   Comments Counselor met with Mr. Uemura today for initial psychosocial evaluation.  He is a 69 year old gentleman who had a CABG in September resulting in his being in this program.  Mr. Coker has a strong support system with a spouse of 8 years and 2 adult children who live closeby.  he also is actively involved in his local church community.  Mr. Fuson reports he sleeps "okay" intermittently due to a urinary problem.  He has been taking Tylenol PM to help with this.  Counselor recommended possibly considering a natural OTC sleep aid that is sustained release instead to help him get back to sleep more easily.  He admits to a history of depression in the early 1990's for about (3) months when his job transferred him around a bit.  He denies current symptoms of depression or anxiety.  Mr. Mcgilvery states he is typically in a positive mood.  He has some stress in his life with his own health issues and reports his spouse is a 3X Cancer survivor, so he worries about her some as well.  Mr. Lebarron goals for this program are to lose weight, return to a semi-normal life again with his energy and stamina back.  Counselor recommended he meet with the dietician to address his weight loss goals.        Psychosocial Re-Evaluation:     Psychosocial Re-Evaluation      05/23/15 1616 06/02/15 1622         Psychosocial Re-Evaluation   Interventions Encouraged to attend Cardiac Rehabilitation for the exercise       Comments New to Cardiac rehab.  Wishes he is getting stronger but Ruble reports he is limited since his Testerone levels are low.          Vocational Rehabilitation: Provide vocational rehab assistance to qualifying candidates.   Vocational Rehab Evaluation & Intervention:      Vocational Rehab - 05/19/15 1352    Initial Vocational Rehab Evaluation & Intervention   Assessment shows need for Vocational Rehabilitation No      Education: Education Goals: Education classes will be provided on a  weekly basis, covering required topics. Participant will state understanding/return demonstration of topics presented.  Learning Barriers/Preferences:     Learning Barriers/Preferences - 05/19/15 1352    Learning Barriers/Preferences   Learning Barriers None   Learning Preferences None      Education Topics: General Nutrition Guidelines/Fats and Fiber: -Group instruction provided by verbal, written material, models and posters to present the general guidelines for heart healthy nutrition. Gives an explanation and review of dietary fats and fiber.   Controlling Sodium/Reading Food Labels: -Group verbal and written material supporting the discussion of sodium use in heart healthy nutrition. Review and explanation with models, verbal and written materials for utilization of the food label.   Exercise Physiology & Risk Factors: - Group verbal and written instruction with models to review the exercise physiology of the cardiovascular system and associated critical values. Details cardiovascular disease risk factors and the goals associated with each risk factor.   Aerobic Exercise & Resistance Training: - Gives group verbal and written discussion on the health impact of inactivity. On the components of aerobic and resistive training programs and the benefits of this training and how to safely progress through these programs.          Cardiac Rehab from 06/01/2015 in [facility] Cardiac Rehab   Date  05/23/15   Educator  [address]   Instruction Review Code  2- meets goals/outcomes      Flexibility, Balance, General Exercise Guidelines: - Provides group verbal and written instruction on the benefits of flexibility and balance training programs. Provides general exercise  guidelines with specific guidelines to those with heart or lung disease. Demonstration and skill practice provided.      Cardiac Rehab from 06/01/2015 in [facility] Cardiac Rehab   Date  05/30/15   Educator  sw   Instruction Review Code  2- meets goals/outcomes      Stress Management: - Provides group verbal and written instruction about the health risks of elevated stress, cause of high stress, and healthy ways to reduce stress.      Cardiac Rehab from 06/01/2015 in [facility] Cardiac Rehab   Date  06/01/15   Educator  [facility]   Instruction Review Code  2- meets goals/outcomes      Depression: - Provides group verbal and written instruction on the correlation between heart/lung disease and depressed mood, treatment options, and the stigmas associated with seeking treatment.   Anatomy & Physiology of the Heart: - Group verbal and written instruction and models provide basic cardiac anatomy and physiology, with the coronary electrical and arterial systems. Review of: AMI, Angina, Valve disease, Heart Failure, Cardiac Arrhythmia, Pacemakers, and the ICD.   Cardiac Procedures: - Group verbal and written instruction and models to describe the testing methods done to diagnose heart disease. Reviews the outcomes of the test results. Describes the treatment choices: Medical Management, Angioplasty, or Coronary Bypass Surgery.   Cardiac Medications: - Group verbal and written instruction to review commonly prescribed medications for heart disease. Reviews the medication, class of the drug, and side effects. Includes the steps to properly store meds and maintain the prescription regimen.   Go Sex-Intimacy & Heart Disease, Get SMART - Goal Setting: - Group verbal and written instruction through game format to discuss heart disease and the return to sexual intimacy. Provides group verbal and written material to discuss and apply goal setting through the application of the S.M.A.R.T. Method.   Other Matters  of the Heart: - Provides group verbal, written materials and models to  describe Heart Failure, Angina, Valve Disease, and Diabetes in the realm of heart disease. Includes description of the disease process and treatment options available to the cardiac patient.   Exercise & Equipment Safety: - Individual verbal instruction and demonstration of equipment use and safety with use of the equipment.      Cardiac Rehab from 06/01/2015 in [facility] Cardiac Rehab   Date  05/19/15   Educator  C. EnterkinRN   Instruction Review Code  1- partially meets, needs review/practice      Infection Prevention: - Provides verbal and written material to individual with discussion of infection control including proper hand washing and proper equipment cleaning during exercise session.      Cardiac Rehab from 06/01/2015 in [facility] Cardiac Rehab   Date  05/19/15   Educator  C. Breannah Kratt,RN   Instruction Review Code  2- meets goals/outcomes      Falls Prevention: - Provides verbal and written material to individual with discussion of falls prevention and safety.      Cardiac Rehab from 06/01/2015 in [facility] Cardiac Rehab   Date  05/19/15   Educator  C. Breandan People,RN   Instruction Review Code  2- meets goals/outcomes      Diabetes: - Individual verbal and written instruction to review signs/symptoms of diabetes, desired ranges of glucose level fasting, after meals and with exercise. Advice that pre and post exercise glucose checks will be done for 3 sessions at entry of program.      Cardiac Rehab from 06/01/2015 in [facility] Cardiac Rehab   Date  05/19/15   Educator  C. Shaylynn Nulty,RN   Instruction Review Code  1- partially meets, needs review/practice       Knowledge Questionnaire Score:     Knowledge Questionnaire Score - 05/19/15 1518    Knowledge Questionnaire Score   Pre Score 21      Personal Goals and Risk Factors at Admission:     Personal Goals and Risk Factors at Admission - 05/19/15 1522     Personal Goals and Risk Factors on Admission   Hypertension No   Lipids Yes   Goal Cholesterol controlled with medications as prescribed, with individualized exercise RX and with personalized nutrition plan. Value goals: LDL < 104m, HDL > 464m Participant states understanding of desired cholesterol values and following prescriptions.   Intervention Provide nutrition & aerobic exercise along with prescribed medications to achieve LDL <7011mHDL >38m59m    Personal Goals and Risk Factors Review:      Goals and Risk Factor Review      05/23/15 1615 05/23/15 1805 06/02/15 1620       Increase Aerobic Exercise and Physical Activity   Goals Progress/Improvement seen  Yes Yes Yes     Comments Exercise a full session today. FranRafi well on the treadmill today. He liked the recumbent bike he exercised on today.  FranVineethorts " I dont' feel like I am getting strong enough but my testerone levels are low. I was suppose to see the MD before my heart event , now I have to see a MD first before they will give me medicine to increase my testerone.      Diabetes   Goal --  Stable today Blood glucose control identified by blood glucose values, HgbA1C. Participant verbalizes understanding of the signs/symptoms of hyper/hypo glycemia, proper foot care and importance of medication and nutrition plan for blood glucose control.  Good blood sugars today.       Abnormal Lipids  Goal   --  Will follow up with his MD about his lipid level.         Personal Goals Discharge (Final Personal Goals and Risk Factors Review):      Goals and Risk Factor Review - 06/02/15 1620    Increase Aerobic Exercise and Physical Activity   Goals Progress/Improvement seen  Yes   Comments Fredric reports " I dont' feel like I am getting strong enough but my testerone levels are low. I was suppose to see the MD before my heart event , now I have to see a MD first before they will give me medicine to increase my testerone.     Abnormal Lipids   Goal --  Will follow up with his MD about his lipid level.        Comments: Dasean wishes he could lose more weight and feel stronger but his testerone level is low. He has to see a specialist about this but it is on hold since he had his heart problems.

## 2015-06-02 NOTE — Progress Notes (Signed)
Daily Session Note  Patient Details  Name: Clent Damore MRN: [id number] Date of Birth: February 13, 1946 Referring Provider:  Thayer Headings, MD  Encounter Date: 06/02/2015  Check In:     Session Check In - 06/02/15 1607    Check-In   Staff Present Lestine Box BS, ACSM EP-C, Exercise Physiologist;Carroll Enterkin RN, BSN;Other   ER physicians immediately available to respond to emergencies See telemetry face sheet for immediately available ER MD   Medication changes reported     No   Fall or balance concerns reported    No   Warm-up and Cool-down Performed on first and last piece of equipment   VAD Patient? No   Pain Assessment   Currently in Pain? No/denies         Goals Met:  Proper associated with RPD/PD & O2 Sat Exercise tolerated well No report of cardiac concerns or symptoms Strength training completed today  Goals Unmet:  Not Applicable  Goals Comments:    Dr. Emily Filbert is Medical Director for [place] and [facility].

## 2015-06-06 ENCOUNTER — Ambulatory Visit: Payer: Medicare Other | Admitting: Internal Medicine

## 2015-06-06 ENCOUNTER — Encounter: Payer: Medicare Other | Admitting: *Deleted

## 2015-06-06 DIAGNOSIS — Z951 Presence of aortocoronary bypass graft: Secondary | ICD-10-CM | POA: Diagnosis not present

## 2015-06-06 NOTE — Progress Notes (Signed)
Daily Session Note  Patient Details  Name: [name] MRN: [id number] Date of Birth: February 24, 1946 Referring Provider:  Thayer Headings, MD  Encounter Date: 06/06/2015  Check In:     Session Check In - 06/06/15 1614    Check-In   Staff Present Nyoka Cowden, RN;Latish Toutant, RN, BSN;Susanne Bice, RN, BSN, [place]   ER physicians immediately available to respond to emergencies See telemetry face sheet for immediately available ER MD   Medication changes reported     No   Fall or balance concerns reported    No   Warm-up and Cool-down Performed on first and last piece of equipment   Pain Assessment   Currently in Pain? No/denies         Goals Met:  Proper associated with RPD/PD & O2 Sat Exercise tolerated well No report of cardiac concerns or symptoms  Goals Unmet:  Not Applicable  Goals Comments:    Dr. Emily Filbert is Medical Director for [place] and [facility].

## 2015-06-13 ENCOUNTER — Encounter: Payer: Self-pay | Admitting: Urology

## 2015-06-13 ENCOUNTER — Ambulatory Visit (INDEPENDENT_AMBULATORY_CARE_PROVIDER_SITE_OTHER): Payer: Medicare Other | Admitting: Urology

## 2015-06-13 VITALS — BP 110/69 | HR 84 | Ht 69.0 in | Wt 206.3 lb

## 2015-06-13 DIAGNOSIS — R351 Nocturia: Secondary | ICD-10-CM | POA: Diagnosis not present

## 2015-06-13 DIAGNOSIS — R350 Frequency of micturition: Secondary | ICD-10-CM | POA: Diagnosis not present

## 2015-06-13 DIAGNOSIS — Z951 Presence of aortocoronary bypass graft: Secondary | ICD-10-CM

## 2015-06-13 DIAGNOSIS — I200 Unstable angina: Secondary | ICD-10-CM | POA: Diagnosis not present

## 2015-06-13 DIAGNOSIS — E291 Testicular hypofunction: Secondary | ICD-10-CM

## 2015-06-13 LAB — URINALYSIS, COMPLETE
Bilirubin, UA: NEGATIVE
Glucose, UA: NEGATIVE
Ketones, UA: NEGATIVE
Leukocytes, UA: NEGATIVE
Nitrite, UA: NEGATIVE
Protein, UA: NEGATIVE
RBC, UA: NEGATIVE
Specific Gravity, UA: 1.02 (ref 1.005–1.030)
Urobilinogen, Ur: 0.2 mg/dL (ref 0.2–1.0)
pH, UA: 5 (ref 5.0–7.5)

## 2015-06-13 LAB — MICROSCOPIC EXAMINATION
Bacteria, UA: NONE SEEN
RBC, UA: NONE SEEN /hpf (ref 0–?)
WBC, UA: NONE SEEN /hpf (ref 0–?)

## 2015-06-13 LAB — BLADDER SCAN AMB NON-IMAGING: Scan Result: 91

## 2015-06-13 NOTE — Progress Notes (Signed)
Daily Session Note  Patient Details  Name: Tahjay Binion MRN: [id number] Date of Birth: Mar 21, 1946 Referring Provider:  Thayer Headings, MD  Encounter Date: 06/13/2015  Check In:     Session Check In - 06/13/15 1606    Check-In   Staff Present Heath Lark, RN, BSN, CCRP;Mallissa Lorenzen, BS, ACSM EP-C, Exercise Physiologist;Carroll Enterkin, RN, BSN   ER physicians immediately available to respond to emergencies See telemetry face sheet for immediately available ER MD   Medication changes reported     No   Fall or balance concerns reported    No   Warm-up and Cool-down Performed on first and last piece of equipment   VAD Patient? No   Pain Assessment   Currently in Pain? No/denies         Goals Met:  Proper associated with RPD/PD & O2 Sat Exercise tolerated well No report of cardiac concerns or symptoms Strength training completed today  Goals Unmet:  Not Applicable  Goals Comments:   Dr. Emily Filbert is Medical Director for [place] and [facility].

## 2015-06-13 NOTE — Progress Notes (Signed)
06/13/2015 2:32 PM   [name] 10-23-1945 [id number]  Referring provider: Shelia Media, MD [address] [place], [place] [address]  Chief Complaint  Patient presents with  . Urinary Frequency    HPI: Patient is a 69 year old white male with a hypogonadism, BPH with LUTS and irritative voiding symptoms who presents today complaining of urinary frequency.  He was last seen by [place] in 02/2015 and was scheduled to have a cystoscopy with urine cytology, but he under went open heart surgery in September and could not undergo the exam.  Today, he the same symptomatology of urinary frequency, urgency, nocturia, leakage of urine, intermittency and a weak urinary stream.  He has been on alpha-blockers, finasteride, Cialis and Myrbetriq without relief.  His PVR's have been under 100 cc.  We are planning to have the patient undergo cystoscopy/urine cytology to rule out CIS in the future.    We will like to offer the patient PTNS, Botox or InterStim for his irritative symptoms.  He will need cardiac clearance for any of these procedures.  He has an appointment with Dr. [name] on 06/21/2015.     He has not had any dysuria, gross hematuria or suprapubic pain.  His UA today is unremarkable.    He also wants to restart his testosterone therapy, but this will need approval Dr. [name].  He was having breast swelling and tenderness, but this has abated since he has stopped his testosterone therapy.    PMH: Past Medical History  Diagnosis Date  . Peripheral vascular disease (HCC)     Bilateral ABIs 0.8  . Chronic distal aortic occlusion (HCC)     By report  . Diabetes mellitus     By his report, this is prediabetes   . SOB (shortness of breath)   . Hyperlipidemia LDL goal <70   . Essential hypertension   . Weakness   . Erectile dysfunction   . Hypogonadism in male     Previously on testosterone injections, but has not yet restarted until further testing  . Enlarged  prostate   . Arthritis   . Depression   . History of kidney stones   . Muscle pain   . CAD, multiple vessel 03/2105    LM 65%, mLAD 70%, 90% & 80%, o-pCx 75%, mCx 95% & d Cx 70%, OM1 60%, mRCA 90% with 100% dRCA. R-R collaterals  . S/P CABG x 5 04/04/2015    LIMA to LAD, SVG to DIAG, SVG to OM, seqSVG -PDA- dCx  . PAF (paroxysmal atrial fibrillation) (HCC) 03/2015    Noted per-cath & post-op CABG.    Surgical History: Past Surgical History  Procedure Laterality Date  . Cardiac catheterization  1992  . Ventral hernia repair  1994    complicated by infection  . Cholecystectomy  2000    also had revision of hernia repair  . Cystectomy      face and scrotum  . Blepharoplasty    . Lexiscan myoview  June 2010    converted to [facility] from treadmill due to inability to reach target heart rate) --the study was normal with no evidence of ischemia and normal LV function. EF 7  . Cardiac catheterization N/A 03/31/2015    Procedure: Left Heart Cath and Coronary Angiography;  Surgeon: Marykay Lex, MD;  Location: [facility] INVASIVE CV LAB;  Service: Cardiovascular;  Laterality: N/A;  . Coronary artery bypass graft N/A 04/04/2015    Procedure: CORONARY ARTERY BYPASS GRAFTING (  CABG);  Surgeon: [name], MD;  Location: [facility] OR;  Service: Open Heart Surgery;  Laterality: N/A;  . Tee without cardioversion N/A 04/04/2015    Procedure: TRANSESOPHAGEAL ECHOCARDIOGRAM (TEE);  Surgeon: [name], MD;  Location: [facility] OR;  Service: Open Heart Surgery;  Laterality: N/A;    Home Medications:    Medication List       This list is accurate as of: 06/13/15  2:32 PM.  Always use your most recent med list.               acetaminophen 650 MG CR tablet  Commonly known as:  TYLENOL  Take 650 mg by mouth every 8 (eight) hours as needed.     amiodarone 200 MG tablet  Commonly known as:  PACERONE  Take 1 tablet (200 mg total) by mouth daily.     aspirin 81 MG EC tablet  Take 1 tablet (81 mg total)  by mouth daily.     atorvastatin 40 MG tablet  Commonly known as:  LIPITOR  Take 1 tablet (40 mg total) by mouth daily.     escitalopram 5 MG tablet  Commonly known as:  LEXAPRO  Take 1 tablet (5 mg total) by mouth daily.     fexofenadine 180 MG tablet  Commonly known as:  ALLEGRA  Take 180 mg by mouth daily.     finasteride 5 MG tablet  Commonly known as:  PROSCAR  Take 1 tablet (5 mg total) by mouth daily.     furosemide 40 MG tablet  Commonly known as:  LASIX     gabapentin 400 MG capsule  Commonly known as:  NEURONTIN  Take 1 capsule (400 mg total) by mouth 3 (three) times daily.     metoprolol tartrate 25 MG tablet  Commonly known as:  LOPRESSOR  Take 1 tablet (25 mg total) by mouth 2 (two) times daily.     multivitamin tablet  Take 1 tablet by mouth daily.     NITROSTAT 0.4 MG SL tablet  Generic drug:  nitroGLYCERIN  Place 0.4 mg under the tongue every 5 (five) minutes x 3 doses as needed. (CHEST PAIN)        Allergies: No Known Allergies  Family History: Family History  Problem Relation Age of Onset  . Coronary artery disease Father   . Heart disease Father   . Stroke Mother   . Stroke Sister   . Prostate cancer Neg Hx   . Kidney disease Neg Hx     Social History:  reports that he quit smoking about 22 years ago. His smoking use included Cigarettes. He has a 80 pack-year smoking history. He has never used smokeless tobacco. He reports that he drinks alcohol. He reports that he does not use illicit drugs.  ROS: UROLOGY Frequent Urination?: Yes Hard to postpone urination?: Yes Burning/pain with urination?: No Get up at night to urinate?: Yes Leakage of urine?: Yes Urine stream starts and stops?: No Trouble starting stream?: No Do you have to strain to urinate?: No Blood in urine?: No Urinary tract infection?: No Sexually transmitted disease?: No Injury to kidneys or bladder?: No Painful intercourse?: No Weak stream?: Yes Erection problems?:  No Penile pain?: Yes  Gastrointestinal Nausea?: No Vomiting?: No Indigestion/heartburn?: Yes Diarrhea?: No Constipation?: No  Constitutional Fever: No Night sweats?: No Weight loss?: No Fatigue?: Yes  Skin Skin rash/lesions?: No Itching?: Yes  Eyes Blurred vision?: No Double vision?: No  Ears/Nose/Throat Sore throat?: No Sinus  problems?: Yes  Hematologic/Lymphatic Swollen glands?: No Easy bruising?: No  Cardiovascular Leg swelling?: No Chest pain?: Yes  Respiratory Cough?: Yes Shortness of breath?: No  Endocrine Excessive thirst?: No  Musculoskeletal Back pain?: No Joint pain?: No  Neurological Headaches?: Yes Dizziness?: No  Psychologic Depression?: No Anxiety?: Yes  Physical Exam: BP 110/69 mmHg  Pulse 84  Ht 5\' 9"  (1.753 m)  Wt 206 lb 4.8 oz (93.577 kg)  BMI 30.45 kg/m2  Constitutional: Well nourished. Alert and oriented, No acute distress. HEENT: [place] AT, moist mucus membranes. Trachea midline, no masses. Cardiovascular: No clubbing, cyanosis, or edema. Respiratory: Normal respiratory effort, no increased work of breathing. Breasts:  No masses or lumps are appreciated, no tenderness is noted bilaterally, breasts are symmetrical and there is no nipple discharge.  There are no palpable axilla lymph nodes bilaterally.   Skin: No rashes, bruises or suspicious lesions. Lymph: No cervical or inguinal adenopathy. Neurologic: Grossly intact, no focal deficits, moving all 4 extremities. Psychiatric: Normal mood and affect.  Laboratory Data: Lab Results  Component Value Date   WBC 6.9 05/18/2015   HGB 13.1 05/18/2015   HCT 39.5 05/18/2015   MCV 95.3 05/18/2015   PLT 161.0 05/18/2015    Lab Results  Component Value Date   CREATININE 1.13 05/18/2015    Lab Results  Component Value Date   PSA 0.6 12/31/2014    Lab Results  Component Value Date   TESTOSTERONE 131* 03/02/2015    Lab Results  Component Value Date   HGBA1C 6.2*  04/03/2015    Urinalysis Results for orders placed or performed in visit on 06/13/15  Microscopic Examination  Result Value Ref Range   WBC, UA None seen 0 -  5 /hpf   RBC, UA None seen 0 -  2 /hpf   Epithelial Cells (non renal) 0-10 0 - 10 /hpf   Bacteria, UA None seen None seen/Few  Urinalysis, Complete  Result Value Ref Range   Specific Gravity, UA 1.020 1.005 - 1.030   pH, UA 5.0 5.0 - 7.5   Color, UA Yellow Yellow   Appearance Ur Clear Clear   Leukocytes, UA Negative Negative   Protein, UA Negative Negative/Trace   Glucose, UA Negative Negative   Ketones, UA Negative Negative   RBC, UA Negative Negative   Bilirubin, UA Negative Negative   Urobilinogen, Ur 0.2 0.2 - 1.0 mg/dL   Nitrite, UA Negative Negative   Microscopic Examination See below:   BLADDER SCAN AMB NON-IMAGING  Result Value Ref Range   Scan Result 91     Pertinent Imaging: Results for AXTEN, PASCUCCI (MRN [id number]) as of 06/13/2015 13:44  Ref. Range 06/13/2015 13:40  Scan Result Unknown 91    Assessment & Plan:    1. Nocturia:    I explained to the patient that nocturia is often multi-factorial and difficult to treat.  Sleeping disorders, heart conditions and peripheral vascular disease, diabetes,  enlarged prostate or urethral stricture causing bladder outlet obstruction and/or certain medications.  I have suggested that the patient avoid caffeine and alcohol in the evening. He may also benefit from fluid restrictions after 6:00 in the evening and voiding just prior to bedtime.  He did have a sleep study in the remote past, but he does not remember the results.  We will address this again if the nocturia persists despite further urological intervention.  - Urinalysis, Complete - BLADDER SCAN AMB NON-IMAGING  2. Frequency:   Patient is still experiencing day time frequency.  We discussed timed voiding and avoiding bladder irritants.  Once he has been cleared by his cardiologist, we will proceed with office  cystoscopy/urine cytology.  Further treatment will be based on the results of these tests.    3. Hypogonadism:   Patient would like to restart his testosterone injections.  I dicussed with him the possible increase risk of MI with testosterone therapy and to discuss this with Dr. Herbie BaltimoreHarding.     Return for patient to contact us after his visit with Dr. Herbie BaltimoreHarding on the 6th. Michiel Cowboy.  Eilyn Polack, PA-C  [facility] [address] [place], [place] [address] [phone]

## 2015-06-15 ENCOUNTER — Encounter: Payer: Medicare Other | Admitting: *Deleted

## 2015-06-15 DIAGNOSIS — Z951 Presence of aortocoronary bypass graft: Secondary | ICD-10-CM | POA: Diagnosis not present

## 2015-06-15 NOTE — Progress Notes (Signed)
Daily Session Note  Patient Details  Name: [name] MRN: [id number] Date of Birth: 06-15-1946 Referring Provider:  Thayer Headings, MD  Encounter Date: 06/15/2015  Check In:     Session Check In - 06/15/15 1608    Check-In   Staff Present Gerlene Burdock, RN, Drusilla Kanner, MS, ACSM CEP, Exercise Physiologist;Diane Joya Gaskins, RN, BSN   ER physicians immediately available to respond to emergencies See telemetry face sheet for immediately available ER MD   Medication changes reported     No   Fall or balance concerns reported    No   Warm-up and Cool-down Performed on first and last piece of equipment   VAD Patient? No   Pain Assessment   Currently in Pain? No/denies   Multiple Pain Sites No         Goals Met:  Independence with exercise equipment Exercise tolerated well Personal goals reviewed No report of cardiac concerns or symptoms Strength training completed today  Goals Unmet:  Not Applicable  Goals Comments: Patient completed exercise prescription and all exercise goals during rehab session. The exercise was tolerated well and the patient is progressing in the program.    Dr. Emily Filbert is Medical Director for [name].

## 2015-06-16 ENCOUNTER — Encounter: Payer: Medicare Other | Attending: Cardiovascular Disease

## 2015-06-16 DIAGNOSIS — Z951 Presence of aortocoronary bypass graft: Secondary | ICD-10-CM | POA: Insufficient documentation

## 2015-06-16 NOTE — Progress Notes (Signed)
Daily Session Note  Patient Details  Name: [name] MRN: [id number] Date of Birth: 06/29/1946 Referring Provider:  Thayer Headings, MD  Encounter Date: 06/16/2015  Check In:     Session Check In - 06/16/15 1611    Check-In   Staff Present Roanna Epley, RN, BSN;Maciah Feeback, BS, ACSM EP-C, Exercise Physiologist;Carroll Enterkin, RN, BSN   ER physicians immediately available to respond to emergencies See telemetry face sheet for immediately available ER MD   Medication changes reported     No   Fall or balance concerns reported    No   Warm-up and Cool-down Performed on first and last piece of equipment   VAD Patient? No   Pain Assessment   Currently in Pain? No/denies         Goals Met:  Proper associated with RPD/PD & O2 Sat Exercise tolerated well No report of cardiac concerns or symptoms Strength training completed today  Goals Unmet:  Not Applicable  Goals Comments:   Dr. Emily Filbert is Medical Director for [facility].

## 2015-06-17 NOTE — Progress Notes (Signed)
Cardiac Individual Treatment Plan  Patient Details  Name: [name] MRN: [id number] Date of Birth: 1946/01/14 Referring Provider:  Thayer Headings, MD  Initial Encounter Date:    Visit Diagnosis: S/P CABG x 4  Patient's Home Medications on Admission:  Current outpatient prescriptions:  .  acetaminophen (TYLENOL) 650 MG CR tablet, Take 650 mg by mouth every 8 (eight) hours as needed., Disp: , Rfl:  .  amiodarone (PACERONE) 200 MG tablet, Take 1 tablet (200 mg total) by mouth daily. (Patient not taking: Reported on 06/13/2015), Disp: 30 tablet, Rfl: 3 .  aspirin EC 81 MG EC tablet, Take 1 tablet (81 mg total) by mouth daily., Disp: , Rfl:  .  atorvastatin (LIPITOR) 40 MG tablet, Take 1 tablet (40 mg total) by mouth daily., Disp: 90 tablet, Rfl: 3 .  escitalopram (LEXAPRO) 5 MG tablet, Take 1 tablet (5 mg total) by mouth daily. (Patient not taking: Reported on 06/13/2015), Disp: 90 tablet, Rfl: 1 .  fexofenadine (ALLEGRA) 180 MG tablet, Take 180 mg by mouth daily., Disp: , Rfl:  .  finasteride (PROSCAR) 5 MG tablet, Take 1 tablet (5 mg total) by mouth daily., Disp: 90 tablet, Rfl: 3 .  furosemide (LASIX) 40 MG tablet, , Disp: , Rfl:  .  gabapentin (NEURONTIN) 400 MG capsule, Take 1 capsule (400 mg total) by mouth 3 (three) times daily., Disp: 270 capsule, Rfl: 3 .  metoprolol tartrate (LOPRESSOR) 25 MG tablet, Take 1 tablet (25 mg total) by mouth 2 (two) times daily. (Patient not taking: Reported on 06/13/2015), Disp: 60 tablet, Rfl: 3 .  Multiple Vitamin (MULTIVITAMIN) tablet, Take 1 tablet by mouth daily., Disp: , Rfl:  .  NITROSTAT 0.4 MG SL tablet, Place 0.4 mg under the tongue every 5 (five) minutes x 3 doses as needed. (CHEST PAIN), Disp: , Rfl:   Past Medical History: Past Medical History  Diagnosis Date  . Peripheral vascular disease (HCC)     Bilateral ABIs 0.8  . Chronic distal aortic occlusion (HCC)     By report  . Diabetes mellitus     By his report, this is  prediabetes   . SOB (shortness of breath)   . Hyperlipidemia LDL goal <70   . Essential hypertension   . Weakness   . Erectile dysfunction   . Hypogonadism in male     Previously on testosterone injections, but has not yet restarted until further testing  . Enlarged prostate   . Arthritis   . Depression   . History of kidney stones   . Muscle pain   . CAD, multiple vessel 03/2105    LM 65%, mLAD 70%, 90% & 80%, o-pCx 75%, mCx 95% & d Cx 70%, OM1 60%, mRCA 90% with 100% dRCA. R-R collaterals  . S/P CABG x 5 04/04/2015    LIMA to LAD, SVG to DIAG, SVG to OM, seqSVG -PDA- dCx  . PAF (paroxysmal atrial fibrillation) ([place]) 03/2015    Noted per-cath & post-op CABG.    Tobacco Use: History  Smoking status  . Former Smoker -- 2.00 packs/day for 40 years  . Types: Cigarettes  . Quit date: 10/17/1992  Smokeless tobacco  . Never Used    Comment: quit 1994    Labs: Recent Review Flowsheet Data    Labs for ITP Cardiac and Pulmonary Rehab Latest Ref Rng 04/04/2015 04/04/2015 04/04/2015 04/05/2015 04/06/2015   PHART 7.350 - 7.450 - 7.381 7.346(L) - 7.428   PCO2ART 35.0 - 45.0 mmHg - 39.8  42.1 - 33.2(L)   HCO3 20.0 - 24.0 mEq/L - 23.9 23.3 - 21.9   TCO2 0 - 100 mmol/L _0 ACIDBASEDEF 0.0 - 2.0 mmol/L - 1.0 3.0(H) - 2.0   O2SAT - - 91.0 91.0 - 90.0       Exercise Target Goals:    Exercise Program Goal: Individual exercise prescription set with THRR, safety & activity barriers. Participant demonstrates ability to understand and report RPE using BORG scale, to self-measure pulse accurately, and to acknowledge the importance of the exercise prescription.  Exercise Prescription Goal: Starting with aerobic activity 30 plus minutes a day, 3 days per week for initial exercise prescription. Provide home exercise prescription and guidelines that participant acknowledges understanding prior to discharge.  Activity Barriers & Risk Stratification:     Activity Barriers & Risk  Stratification - 05/19/15 1343    Activity Barriers & Risk Stratification   Activity Barriers Arthritis;Other (comment)   Comments Hips hurt when walk on an incline. Goes up to a 5/10. Stopping and resting helps.    Risk Stratification High      6 Minute Walk:     6 Minute Walk      05/19/15 1439       6 Minute Walk   Phase Initial     Distance 1450 feet     Walk Time 6 minutes     Resting HR 75 bpm     Resting BP 102/60 mmHg     Max Ex. HR 94 bpm     Max Ex. BP 128/66 mmHg     RPE 13     Symptoms No        Initial Exercise Prescription:     Initial Exercise Prescription - 05/19/15 1400    Date of Initial Exercise Prescription   Date 05/19/15   Treadmill   MPH 2   Grade 0   Minutes 10   Recumbant Bike   Level 2   RPM 40   Watts 20   Minutes 10   NuStep   Level 2   Watts 40   Minutes 10   Arm Ergometer   Level 1   Watts 10   Minutes 10   REL-XR   Level 2   Watts 50   Minutes 10   Prescription Details   Frequency (times per week) 3   Duration Progress to 30 minutes of continuous aerobic without signs/symptoms of physical distress   Intensity   THRR REST +  30   Ratings of Perceived Exertion 11-15   Perceived Dyspnea 2-4   Progression Continue progressive overload as per policy without signs/symptoms or physical distress.   Resistance Training   Training Prescription Yes   Weight 2   Reps 10-15      Exercise Prescription Changes:     Exercise Prescription Changes      05/25/15 1600 06/01/15 1600 06/13/15 0824       Exercise Review   Progression Yes Yes Yes     Response to Exercise   Blood Pressure (Admit)   104/62 mmHg     Blood Pressure (Exercise)   148/76 mmHg     Blood Pressure (Exit)   111/62 mmHg     Heart Rate (Admit)   85 bpm     Heart Rate (Exercise)   111 bpm     Heart Rate (Exit)   88 bpm     Rating of Perceived Exertion (Exercise)   12  Symptoms  None Some SOB during exercise; 4 on dyspnea scale     Comments  Reviewed  individualized exercise prescription and made increases per departmental policy. Exercise increases were discussed with the patient and they were able to perform the new work loads without issue (no signs or symptoms).  Discussed interval training on the XR with Pilar Plate     Duration  Progress to 30 minutes of continuous aerobic without signs/symptoms of physical distress Progress to 50 minutes of aerobic without signs/symptoms of physical distress     Intensity  Rest + 30 Rest + 30     Progression  Continue progressive overload as per policy without signs/symptoms or physical distress. Continue progressive overload as per policy without signs/symptoms or physical distress.     Resistance Training   Training Prescription  Yes Yes     Weight  4 4     Reps  10-15 10-15     Interval Training   Interval Training  No Yes     Equipment   REL-XR     Treadmill   MPH 2._0 Grade 0 0 0     Minutes _1 Recumbant Bike   Level _2 RPM 60 60 60     Minutes  20 20     REL-XR   Level   4     Watts   70     Minutes   20        Discharge Exercise Prescription (Final Exercise Prescription Changes):     Exercise Prescription Changes - 06/13/15 0824    Exercise Review   Progression Yes   Response to Exercise   Blood Pressure (Admit) 104/62 mmHg   Blood Pressure (Exercise) 148/76 mmHg   Blood Pressure (Exit) 111/62 mmHg   Heart Rate (Admit) 85 bpm   Heart Rate (Exercise) 111 bpm   Heart Rate (Exit) 88 bpm   Rating of Perceived Exertion (Exercise) 12   Symptoms Some SOB during exercise; 4 on dyspnea scale   Comments Discussed interval training on the XR with Pilar Plate   Duration Progress to 50 minutes of aerobic without signs/symptoms of physical distress   Intensity Rest + 30   Progression Continue progressive overload as per policy without signs/symptoms or physical distress.   Resistance Training   Training Prescription Yes   Weight 4   Reps 10-15   Interval Training    Interval Training Yes   Equipment REL-XR   Treadmill   MPH 3   Grade 0   Minutes 20   Recumbant Bike   Level 3   RPM 60   Minutes 20   REL-XR   Level 4   Watts 70   Minutes 20      Nutrition:  Target Goals: Understanding of nutrition guidelines, daily intake of sodium <1554m, cholesterol <2045m calories 30% from fat and 7% or less from saturated fats, daily to have 5 or more servings of fruits and vegetables.  Biometrics:    Nutrition Therapy Plan and Nutrition Goals:     Nutrition Therapy & Goals - 05/19/15 1520    Personal Nutrition Goals   Personal Goal #1 Cont to eat like he learned in the prediabetes class  FrDioaid he would prefer not to have an individual appt with the Cardiac Rehab registered dietician since he has attended the prediabetes class before.    Intervention Plan  Intervention Using nutrition plan and personal goals to gain a healthy nutrition lifestyle. Add exercise as prescribed.      Nutrition Discharge: Rate Your Plate Scores:     Rate Your Plate - 95/28/41 3244    Rate Your Plate Scores   Pre Score 69   Pre Score % 77 %      Nutrition Goals Re-Evaluation:     Nutrition Goals Re-Evaluation      05/23/15 1805 06/02/15 1618 06/17/15 1412       Personal Goal #1 Re-Evaluation   Goal Progress Seen Yes No Yes     Comments  I asked Shalik if he wanted to see the Cardiac Rehab dietician and he told me again he wanted to wait to check with his wife who needs to lose weight. He said his wife got mad at him today since he ate [employer] [place] Lucendia Herrlich and a [place].          Psychosocial: Target Goals: Acknowledge presence or absence of depression, maximize coping skills, provide positive support system. Participant is able to verbalize types and ability to use techniques and skills needed for reducing stress and depression.  Initial Review & Psychosocial Screening:     Initial Psych Review & Screening - 05/19/15 1522    Family  Dynamics   Good Support System? Yes   Barriers   Psychosocial barriers to participate in program There are no identifiable barriers or psychosocial needs.   Screening Interventions   Interventions Encouraged to exercise      Quality of Life Scores:     Quality of Life - 05/19/15 1522    Quality of Life Scores   Health/Function Pre 17.61 %   Socioeconomic Pre 25.58 %   Psych/Spiritual Pre 20.86 %   Family Pre 28.8 %   GLOBAL Pre 21.56 %      PHQ-9:     Recent Review Flowsheet Data    Depression screen [facility] 2/9 05/19/2015   Decreased Interest 1   Down, Depressed, Hopeless 1   PHQ - 2 Score 2   Altered sleeping 2   Tired, decreased energy 2   Change in appetite 0   Feeling bad or failure about yourself  0   Trouble concentrating 0   Moving slowly or fidgety/restless 0   Suicidal thoughts 0   PHQ-9 Score 6   Difficult doing work/chores Not difficult at all      Psychosocial Evaluation and Intervention:     Psychosocial Evaluation - 05/25/15 1717    Psychosocial Evaluation & Interventions   Interventions Stress management education;Relaxation education;Encouraged to exercise with the program and follow exercise prescription   Comments Counselor met with Mr. Wojtaszek today for initial psychosocial evaluation.  He is a 69 year old gentleman who had a CABG in September resulting in his being in this program.  Mr. Shartzer has a strong support system with a spouse of 90 years and 2 adult children who live closeby.  he also is actively involved in his local church community.  Mr. Iqbal reports he sleeps "okay" intermittently due to a urinary problem.  He has been taking Tylenol PM to help with this.  Counselor recommended possibly considering a natural OTC sleep aid that is sustained release instead to help him get back to sleep more easily.  He admits to a history of depression in the early 1990's for about (3) months when his job transferred him around a bit.  He denies current  symptoms of depression  or anxiety.  Mr. Buysse states he is typically in a positive mood.  He has some stress in his life with his own health issues and reports his spouse is a 3X Cancer survivor, so he worries about her some as well.  Mr. Hodsdon goals for this program are to lose weight, return to a semi-normal life again with his energy and stamina back.  Counselor recommended he meet with the dietician to address his weight loss goals.        Psychosocial Re-Evaluation:     Psychosocial Re-Evaluation      05/23/15 1616 06/02/15 1622 06/17/15 1413       Psychosocial Re-Evaluation   Interventions Encouraged to attend Cardiac Rehabilitation for the exercise  Encouraged to attend Cardiac Rehabilitation for the exercise     Comments New to Cardiac rehab.  Wishes he is getting stronger but Dellas reports he is limited since his Testerone levels are low.          Vocational Rehabilitation: Provide vocational rehab assistance to qualifying candidates.   Vocational Rehab Evaluation & Intervention:     Vocational Rehab - 05/19/15 1352    Initial Vocational Rehab Evaluation & Intervention   Assessment shows need for Vocational Rehabilitation No      Education: Education Goals: Education classes will be provided on a weekly basis, covering required topics. Participant will state understanding/return demonstration of topics presented.  Learning Barriers/Preferences:     Learning Barriers/Preferences - 05/19/15 1352    Learning Barriers/Preferences   Learning Barriers None   Learning Preferences None      Education Topics: General Nutrition Guidelines/Fats and Fiber: -Group instruction provided by verbal, written material, models and posters to present the general guidelines for heart healthy nutrition. Gives an explanation and review of dietary fats and fiber.   Controlling Sodium/Reading Food Labels: -Group verbal and written material supporting the discussion of sodium use in  heart healthy nutrition. Review and explanation with models, verbal and written materials for utilization of the food label.   Exercise Physiology & Risk Factors: - Group verbal and written instruction with models to review the exercise physiology of the cardiovascular system and associated critical values. Details cardiovascular disease risk factors and the goals associated with each risk factor.   Aerobic Exercise & Resistance Training: - Gives group verbal and written discussion on the health impact of inactivity. On the components of aerobic and resistive training programs and the benefits of this training and how to safely progress through these programs.          Cardiac Rehab from 06/15/2015 in [facility] Cardiac Rehab   Date  05/23/15   Educator  [address]   Instruction Review Code  2- meets goals/outcomes      Flexibility, Balance, General Exercise Guidelines: - Provides group verbal and written instruction on the benefits of flexibility and balance training programs. Provides general exercise guidelines with specific guidelines to those with heart or lung disease. Demonstration and skill practice provided.      Cardiac Rehab from 06/15/2015 in [facility] Cardiac Rehab   Date  05/30/15   Educator  sw   Instruction Review Code  2- meets goals/outcomes      Stress Management: - Provides group verbal and written instruction about the health risks of elevated stress, cause of high stress, and healthy ways to reduce stress.      Cardiac Rehab from 06/15/2015 in [facility] Cardiac Rehab   Date  06/01/15   Educator  [facility]  Instruction Review Code  2- meets goals/outcomes      Depression: - Provides group verbal and written instruction on the correlation between heart/lung disease and depressed mood, treatment options, and the stigmas associated with seeking treatment.   Anatomy & Physiology of the Heart: - Group verbal and written instruction and models provide basic cardiac anatomy and  physiology, with the coronary electrical and arterial systems. Review of: AMI, Angina, Valve disease, Heart Failure, Cardiac Arrhythmia, Pacemakers, and the ICD.      Cardiac Rehab from 06/15/2015 in [facility] Cardiac Rehab   Date  06/06/15   Educator  S. Travis   Instruction Review Code  2- meets goals/outcomes      Cardiac Procedures: - Group verbal and written instruction and models to describe the testing methods done to diagnose heart disease. Reviews the outcomes of the test results. Describes the treatment choices: Medical Management, Angioplasty, or Coronary Bypass Surgery.   Cardiac Medications: - Group verbal and written instruction to review commonly prescribed medications for heart disease. Reviews the medication, class of the drug, and side effects. Includes the steps to properly store meds and maintain the prescription regimen.   Go Sex-Intimacy & Heart Disease, Get SMART - Goal Setting: - Group verbal and written instruction through game format to discuss heart disease and the return to sexual intimacy. Provides group verbal and written material to discuss and apply goal setting through the application of the S.M.A.R.T. Method.   Other Matters of the Heart: - Provides group verbal, written materials and models to describe Heart Failure, Angina, Valve Disease, and Diabetes in the realm of heart disease. Includes description of the disease process and treatment options available to the cardiac patient.      Cardiac Rehab from 06/15/2015 in [facility] Cardiac Rehab   Date  06/15/15   Educator  DW   Instruction Review Code  2- meets goals/outcomes      Exercise & Equipment Safety: - Individual verbal instruction and demonstration of equipment use and safety with use of the equipment.      Cardiac Rehab from 06/15/2015 in [facility] Cardiac Rehab   Date  05/19/15   Educator  C. EnterkinRN   Instruction Review Code  1- partially meets, needs review/practice      Infection  Prevention: - Provides verbal and written material to individual with discussion of infection control including proper hand washing and proper equipment cleaning during exercise session.      Cardiac Rehab from 06/15/2015 in [facility] Cardiac Rehab   Date  05/19/15   Educator  C. Conley Delisle,RN   Instruction Review Code  2- meets goals/outcomes      Falls Prevention: - Provides verbal and written material to individual with discussion of falls prevention and safety.      Cardiac Rehab from 06/15/2015 in [facility] Cardiac Rehab   Date  05/19/15   Educator  C. Evea Sheek,RN   Instruction Review Code  2- meets goals/outcomes      Diabetes: - Individual verbal and written instruction to review signs/symptoms of diabetes, desired ranges of glucose level fasting, after meals and with exercise. Advice that pre and post exercise glucose checks will be done for 3 sessions at entry of program.      Cardiac Rehab from 06/15/2015 in [facility] Cardiac Rehab   Date  05/19/15   Educator  C. Romona Murdy,RN   Instruction Review Code  1- partially meets, needs review/practice       Knowledge Questionnaire Score:     Knowledge Questionnaire  Score - 05/19/15 1518    Knowledge Questionnaire Score   Pre Score 21      Personal Goals and Risk Factors at Admission:     Personal Goals and Risk Factors at Admission - 05/19/15 1522    Personal Goals and Risk Factors on Admission   Hypertension No   Lipids Yes   Goal Cholesterol controlled with medications as prescribed, with individualized exercise RX and with personalized nutrition plan. Value goals: LDL < 41m, HDL > 428m Participant states understanding of desired cholesterol values and following prescriptions.   Intervention Provide nutrition & aerobic exercise along with prescribed medications to achieve LDL <7010mHDL >42m17m    Personal Goals and Risk Factors Review:      Goals and Risk Factor Review      05/23/15 1615 05/23/15 1805 06/02/15 1620 06/17/15  1412     Increase Aerobic Exercise and Physical Activity   Goals Progress/Improvement seen  Yes Yes Yes Yes    Comments Exercise a full session today. FranKekai well on the treadmill today. He liked the recumbent bike he exercised on today.  FranMorellorts " I dont' feel like I am getting strong enough but my testerone levels are low. I was suppose to see the MD before my heart event , now I have to see a MD first before they will give me medicine to increase my testerone.      Diabetes   Goal --  Stable today Blood glucose control identified by blood glucose values, HgbA1C. Participant verbalizes understanding of the signs/symptoms of hyper/hypo glycemia, proper foot care and importance of medication and nutrition plan for blood glucose control.  Good blood sugars today.       Abnormal Lipids   Goal   --  Will follow up with his MD about his lipid level.      Progress seen towards goals    Unknown    Comments    Primary Care MD will follow up blood work for this.        Personal Goals Discharge (Final Personal Goals and Risk Factors Review):      Goals and Risk Factor Review - 06/17/15 1412    Increase Aerobic Exercise and Physical Activity   Goals Progress/Improvement seen  Yes   Abnormal Lipids   Progress seen towards goals Unknown   Comments Primary Care MD will follow up blood work for this.       ITP Comments:   Comments: FranIcholas good attendance at Cardiac Rehab.

## 2015-06-20 DIAGNOSIS — Z951 Presence of aortocoronary bypass graft: Secondary | ICD-10-CM

## 2015-06-20 NOTE — Progress Notes (Signed)
Daily Session Note  Patient Details  Name: [name] MRN: [id number] Date of Birth: 1945/12/13 Referring Provider:  Thayer Headings, MD  Encounter Date: 06/20/2015  Check In:     Session Check In - 06/20/15 1601    Check-In   Staff Present Heath Lark, RN, BSN, CCRP;Carroll Enterkin, RN, BSN;Sahej Schrieber, BS, ACSM EP-C, Exercise Physiologist   ER physicians immediately available to respond to emergencies See telemetry face sheet for immediately available ER MD   Medication changes reported     No   Fall or balance concerns reported    No   Warm-up and Cool-down Performed on first and last piece of equipment   VAD Patient? No   Pain Assessment   Currently in Pain? No/denies         Goals Met:  Proper associated with RPD/PD & O2 Sat Exercise tolerated well No report of cardiac concerns or symptoms Strength training completed today  Goals Unmet:  Not Applicable  Goals Comments:   Dr. Emily Filbert is Medical Director for [facility] and [facility].

## 2015-06-21 ENCOUNTER — Ambulatory Visit (INDEPENDENT_AMBULATORY_CARE_PROVIDER_SITE_OTHER): Payer: Medicare Other | Admitting: Cardiology

## 2015-06-21 VITALS — BP 110/64 | HR 80 | Ht 69.0 in | Wt 204.7 lb

## 2015-06-21 DIAGNOSIS — I2510 Atherosclerotic heart disease of native coronary artery without angina pectoris: Secondary | ICD-10-CM | POA: Diagnosis not present

## 2015-06-21 DIAGNOSIS — I10 Essential (primary) hypertension: Secondary | ICD-10-CM

## 2015-06-21 DIAGNOSIS — I480 Paroxysmal atrial fibrillation: Secondary | ICD-10-CM

## 2015-06-21 DIAGNOSIS — I200 Unstable angina: Secondary | ICD-10-CM

## 2015-06-21 DIAGNOSIS — Z951 Presence of aortocoronary bypass graft: Secondary | ICD-10-CM

## 2015-06-21 DIAGNOSIS — I739 Peripheral vascular disease, unspecified: Secondary | ICD-10-CM

## 2015-06-21 DIAGNOSIS — I700 Atherosclerosis of aorta: Secondary | ICD-10-CM

## 2015-06-21 DIAGNOSIS — I25119 Atherosclerotic heart disease of native coronary artery with unspecified angina pectoris: Secondary | ICD-10-CM

## 2015-06-21 DIAGNOSIS — E785 Hyperlipidemia, unspecified: Secondary | ICD-10-CM | POA: Diagnosis not present

## 2015-06-21 MED ORDER — CLOPIDOGREL BISULFATE 75 MG PO TABS: 75.0000 mg | ORAL_TABLET | Freq: Every day | ORAL | Status: DC

## 2015-06-21 MED ORDER — METOPROLOL TARTRATE 25 MG PO TABS: 12.5000 mg | ORAL_TABLET | Freq: Two times a day (BID) | ORAL | Status: DC

## 2015-06-21 NOTE — Progress Notes (Signed)
PCP: Wynona DoveWALKER,[name], MD  Clinic Note: Chief Complaint  Patient presents with  . Follow-up    Coronary artery disease status post CABG  . Chest Pain    pt states that he has been having a lot lately, also states that when he sneezes it hurts   . Shortness of Breath    some SOB, no light headedness or dizziness but states he has been getting a lot of headaches and has the worst taste in his mouth  . Edema    no edema     HPI: [name] is a 69 y.o. male with a PMH below who presents today for 2 months f/u of CAD. 1. Severe multivessel disease with heavy calcification of the proximal left and right coronary arteries. 2. LM lesion, 65% stenosed. 3. Mid LAD-1 lesion, 70% stenosed. Mid LAD-2 lesion, 90% stenosed prior to D1. Distmid LAD lesion, 80% stenosed. 4. Ost Cx to Prox Cx lesion, 75% stenosed. Mid Cx lesion, 95% stenosed. Dist Cx lesion, 70% stenosed. 1st Mrg lesion, 60% stenosed wtih 80% in smaller OM1a branch 5. Mid RCA lesion, 90% stenosed. Mid RCA to Dist RCA lesion, 100% stenosed - CTO. There is R-R collateralization from the RV Marginal branch to RPDA faintly filling the RPDA & small RPL. 6. The left ventricular systolic function is normal. 7. New onset Diagnosis of Afib - initially with RVR   04/04/2015: CORONARY ARTERY BYPASS GRAFTING X 5 (LIMA to LAD, SVG to DIAG, SVG to OM, seqSVG -PDA- dCx)  [name] was last seen on 04/18/15 => was recovering from CABG - just discharged.  No Afib.  MSK pains.  No angina. No more lasix.  Recent Hospitalizations: none since last visit  Studies Reviewed: none Doing [facility] - latest report - doing well.  Interval History: [name] presents today for a short followup visit due to chest wall discomfort. He's been having a lot of discomfort along the suture line and the sternal border, this is worse when he coughs and sneezes. It is not similar to his preop angina symptoms. He does note some exertional dyspnea, but this is getting  better.   No anginal chest pain with rest or exertion.  No PND, orthopnea or edema.  Occasional "flip flop" sensations, but no rapid palpitations or heart rate.. No palpitations, lightheadedness, dizziness, weakness or syncope/near syncope. No TIA/amaurosis fugax symptoms. He has not been walking enough to see if he has any claudication.  ROS: A comprehensive was performed. Review of Systems  Constitutional: Negative for diaphoresis.  HENT:       Has a poor taste in his mouth to  Respiratory: Positive for cough (off & on). Negative for shortness of breath and wheezing.   Cardiovascular: Negative for leg swelling.       Per history of present illness  Gastrointestinal: Negative for blood in stool and melena.  Genitourinary: Negative for hematuria.  Musculoskeletal: Negative for falls.       Mild chest wall discomfort - off & on with movement   Neurological: Positive for dizziness (less prominent) and headaches.  Endo/Heme/Allergies: Does not bruise/bleed easily.  Psychiatric/Behavioral: The patient has insomnia (improved).   All other systems reviewed and are negative.    Past Medical History  Diagnosis Date  . Peripheral vascular disease (HCC)     Bilateral ABIs 0.8  . Chronic distal aortic occlusion (HCC)     By report  . Diabetes mellitus     By his report, this is prediabetes   .  SOB (shortness of breath)   . Hyperlipidemia LDL goal <70   . Essential hypertension   . Weakness   . Erectile dysfunction   . Hypogonadism in male     Previously on testosterone injections, but has not yet restarted until further testing  . Enlarged prostate   . Arthritis   . Depression   . History of kidney stones   . Muscle pain   . CAD, multiple vessel 03/2105    LM 65%, mLAD 70%, 90% & 80%, o-pCx 75%, mCx 95% & d Cx 70%, OM1 60%, mRCA 90% with 100% dRCA. R-R collaterals  . S/P CABG x 5 04/04/2015    LIMA to LAD, SVG to DIAG, SVG to OM, seqSVG -PDA- dCx  . PAF (paroxysmal atrial  fibrillation) (HCC) 03/2015    Noted per-cath & post-op CABG.    Past Surgical History  Procedure Laterality Date  . Cardiac catheterization  1992  . Ventral hernia repair  1994    complicated by infection  . Cholecystectomy  2000    also had revision of hernia repair  . Cystectomy      face and scrotum  . Blepharoplasty    . Lexiscan myoview  June 2010    converted to [facility] from treadmill due to inability to reach target heart rate) --the study was normal with no evidence of ischemia and normal LV function. EF 7  . Cardiac catheterization N/A 03/31/2015    Procedure: Left Heart Cath and Coronary Angiography;  Surgeon: Marykay Lex, MD;  Location: [facility] INVASIVE CV LAB;  Service: Cardiovascular;  Laterality: N/A;  . Coronary artery bypass graft N/A 04/04/2015    Procedure: CORONARY ARTERY BYPASS GRAFTING (CABG);  Surgeon: [name], MD;  Location: [facility] OR;  Service: Open Heart Surgery;  Laterality: N/A;  . Tee without cardioversion N/A 04/04/2015    Procedure: TRANSESOPHAGEAL ECHOCARDIOGRAM (TEE);  Surgeon: [name], MD;  Location: [facility] OR;  Service: Open Heart Surgery;  Laterality: N/A;    Prior to Admission medications   Medication Sig Start Date End Date Taking? Authorizing Provider  acetaminophen (TYLENOL) 650 MG CR tablet Take 650 mg by mouth every 8 (eight) hours as needed.   Yes Historical Provider, MD  amiodarone (PACERONE) 200 MG tablet Take 1 tablet (200 mg total) by mouth daily. 04/14/15  Yes Erin R Barrett, PA-C  aspirin EC 81 MG EC tablet Take 1 tablet (81 mg total) by mouth daily. 04/11/15  Yes Erin R Barrett, PA-C  atorvastatin (LIPITOR) 40 MG tablet Take 1 tablet (40 mg total) by mouth daily. 04/20/15  Yes Shelia Media, MD  fexofenadine (ALLEGRA) 180 MG tablet Take 180 mg by mouth daily.   Yes Historical Provider, MD  finasteride (PROSCAR) 5 MG tablet Take 1 tablet (5 mg total) by mouth daily. 12/17/14  Yes Shelia Media, MD  furosemide (LASIX) 40 MG  tablet  04/18/15  Yes Historical Provider, MD  gabapentin (NEURONTIN) 400 MG capsule Take 1 capsule (400 mg total) by mouth 3 (three) times daily. 12/17/14  Yes Shelia Media, MD  Multiple Vitamin (MULTIVITAMIN) tablet Take 1 tablet by mouth daily.   Yes Historical Provider, MD  NITROSTAT 0.4 MG SL tablet Place 0.4 mg under the tongue every 5 (five) minutes x 3 doses as needed. (CHEST PAIN) 03/28/15  Yes Historical Provider, MD   No Known Allergies   Social History   Social History  . Marital Status: Married    Spouse Name: N/A  .  Number of Children: N/A  . Years of Education: N/A   Social History Main Topics  . Smoking status: Former Smoker -- 2.00 packs/day for 40 years    Types: Cigarettes    Quit date: 10/17/1992  . Smokeless tobacco: Never Used     Comment: quit 1994  . Alcohol Use: 0.0 oz/week    0 Standard drinks or equivalent per week     Comment: seldom  . Drug Use: No  . Sexual Activity: Not Asked   Other Topics Concern  . None   Social History Narrative   Lives in [place] with wife. No pets   Two children.      Former smoker. Quit in 1994. Prior to quitting was unable to walk without exertional dyspnea.      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      Hobbies - reading, drives Corvette, chuch   Family History  Problem Relation Age of Onset  . Coronary artery disease Father   . Heart disease Father   . Stroke Mother   . Stroke Sister   . Prostate cancer Neg Hx   . Kidney disease Neg Hx      Wt Readings from Last 3 Encounters:  06/21/15 204 lb 11.2 oz (92.851 kg)  06/13/15 206 lb 4.8 oz (93.577 kg)  05/18/15 206 lb (93.441 kg)    PHYSICAL EXAM BP 110/64 mmHg  Pulse 80  Ht  (1.753 m)  Wt 204 lb 11.2 oz (92.851 kg)  BMI 30.22 kg/m2 General appearance: alert, cooperative, appears stated age, no distress and borderline obese - notable weight loss since preop HEENT:  [place]/AT, EOMI, MMM, anicteric sclera - mild left eye ptosis (had a history of blepharoplasty) Neck: no adenopathy, no carotid bruit, no JVD and supple, symmetrical, trachea midline Lungs: CTAB, normal percussion bilaterally and Nonlabored, good air movement Heart: RRR with normal S1 and S2. Occasional ectopy. No M/R/G.; expected chest wall tenderness Abdomen: soft, non-tender; bowel sounds normal; no masses, no organomegaly and Truncal obesity Extremities: edema 1+ pitting,. Faint pedal pulses - barely palpable Pulses: 1+ bilateral radial with a barely palpable DP/PT, 2+ femoral with + bruit Skin: Distal foot/leg rubor with mild stasis changes. No evidence of cellulitis Neurologic: Mental status: Alert, oriented, thought content appropriate, affect: normal Cranial nerves: Grossly normal He does have chest wall tenderness along both aspects of the sternal border where the sternal wires are. There is a little bit of crepitus along the incision sites over the sternum is healing.   Adult ECG Report  Rate: 80 ;  Rhythm: normal sinus rhythm and normal axis, intervals & durations.;   Narrative Interpretation: stable, normal EKG   Other studies Reviewed: Additional studies/ records that were reviewed today include:  Recent Labs:   Lab Results  Component Value Date   CHOL 215* 04/01/2015   HDL 41 04/01/2015   LDLCALC 132* 04/01/2015   TRIG 211* 04/01/2015   CHOLHDL 5.2 04/01/2015    ASSESSMENT / PLAN: Problem List Items Addressed This Visit    S/P CABG x 5 (Chronic)    Still noticing a lot of musculoskeletal chest wall pain from his bypass surgery. I  Reassured him that these are normal postop symptoms and should get better. He can use Tylenol or even occasional ibuprofen as needed.      Relevant Orders   EKG 12-Lead (Completed)   PAF (paroxysmal atrial fibrillation) (HCC) (Chronic)  Noted during the time his cardiac catheterization prior to his CABG. Did not have any reoccurrence  following bypass surgery. We will wean off amiodarone after current bottle. We'll then restart low-dose beta blocker 12.5 mg twice a day. He was also taking this but has not been.  He is reluctant to the idea of full anticoagulation. I would see if he has any recurrence now that he is for now we will simply stop aspirin and restart Plavix 75 mg daily. If he has any further episodes, we would then need to start full anticoagulation.      Relevant Medications   metoprolol tartrate (LOPRESSOR) 25 MG tablet   Other Relevant Orders   EKG 12-Lead (Completed)   PAD (peripheral artery disease) (HCC) (Chronic)    Monitored by Dr. Hart Rochester from vascular surgery. Starting back on Plavix. As far as I can tell, he has not noted any claudication. He has not yet really started exercising a lot since his surgery.      Relevant Medications   metoprolol tartrate (LOPRESSOR) 25 MG tablet   Essential hypertension (Chronic)    Improved blood pressure. Beta blocker had been fully stopped. I think now he should be okay with starting it back at low dose.      Relevant Medications   metoprolol tartrate (LOPRESSOR) 25 MG tablet   Other Relevant Orders   EKG 12-Lead (Completed)   Dyslipidemia, goal LDL below 70 (Chronic)    Convert to higher potency atorvastatin from simvastatin. Labs monitored by PCP.      Relevant Medications   metoprolol tartrate (LOPRESSOR) 25 MG tablet   Coronary artery disease involving native coronary artery with angina pectoris (HCC) (Chronic)    No further angina postop. He is on statin and now restarting beta blocker. Cardiac rehabilitation for postop.      Relevant Medications   metoprolol tartrate (LOPRESSOR) 25 MG tablet   CAD, multiple vessel (Chronic)    Status post CABG. Started on cardiac rehabilitation. Now back on beta blocker as amiodarone being stopped. On high dose statin.      Relevant Medications   metoprolol tartrate (LOPRESSOR) 25 MG tablet   Other  Relevant Orders   EKG 12-Lead (Completed)   Aortic atherosclerosis (HCC) - Primary (Chronic)    Increased to atorvastatin by PCP. No longer smoking.      Relevant Medications   metoprolol tartrate (LOPRESSOR) 25 MG tablet   Other Relevant Orders   EKG 12-Lead (Completed)      Current medicines are reviewed at length with the patient today. (+/- concerns) no medicine concerns but is noting mild bruising   The following changes have been made:    Stop aspirin  Stop amiodarone after current bottle-  the next day start metoprolol tart 12.5 mg twice a day  Start clopidogrel 75 mg one tablet daily  Your physician wants you to follow-up in 4 months DR HARDING - [place]  OFFICE.   Studies Ordered:   Orders Placed This Encounter  Procedures  . EKG 12-Lead      Marykay Lex, M.D., M.S. Interventional Cardiologist   Pager # [phone]

## 2015-06-21 NOTE — Patient Instructions (Addendum)
   Stop aspirin  Stop amiodarone after current bottle-  the next day start metoprolol tart 12.5 mg twice a day  Start clopidogrel 75 mg one tablet daily  Your physician wants you to follow-up in 4 months DR HARDING - [place]  OFFICE.    You will receive a reminder letter in the mail two months in advance. If you don't receive a letter, please call our office to schedule the follow-up appointment.  If you need a refill on your cardiac medications before your next appointment, please call your pharmacy.

## 2015-06-22 ENCOUNTER — Encounter: Payer: Medicare Other | Admitting: *Deleted

## 2015-06-22 DIAGNOSIS — Z951 Presence of aortocoronary bypass graft: Secondary | ICD-10-CM

## 2015-06-22 NOTE — Addendum Note (Signed)
Addended by: Rudy JewBICE, Jhene Westmoreland P on: 06/22/2015 11:09 AM   Modules accepted: Orders

## 2015-06-22 NOTE — Progress Notes (Signed)
Daily Session Note  Patient Details  Name: [name] MRN: [id number] Date of Birth: 06/15/1946 Referring Provider:  Thayer Headings, MD  Encounter Date: 06/22/2015  Check In:     Session Check In - 06/22/15 1603    Check-In   Staff Present Gerlene Burdock, RN, BSN;Diane Joya Gaskins, RN, Drusilla Kanner, MS, ACSM CEP, Exercise Physiologist   ER physicians immediately available to respond to emergencies See telemetry face sheet for immediately available ER MD   Medication changes reported     No   Fall or balance concerns reported    No   Warm-up and Cool-down Performed on first and last piece of equipment   VAD Patient? No   Pain Assessment   Currently in Pain? No/denies           Exercise Prescription Changes - 06/22/15 1600    Exercise Review   Progression Yes   Response to Exercise   Blood Pressure (Admit) --   Blood Pressure (Exercise) --   Blood Pressure (Exit) --   Heart Rate (Admit) --   Heart Rate (Exercise) --   Heart Rate (Exit) --   Rating of Perceived Exertion (Exercise) --   Symptoms --   Comments Add one day of home exercise per week.     Duration Progress to 50 minutes of aerobic without signs/symptoms of physical distress   Intensity Rest + 30   Progression Continue progressive overload as per policy without signs/symptoms or physical distress.   Resistance Training   Training Prescription Yes   Weight 4   Reps 10-15   Interval Training   Interval Training Yes   Equipment REL-XR   Treadmill   MPH 3   Grade 0   Minutes 20   Recumbant Bike   Level 3   RPM 60   Minutes 20   REL-XR   Level 4   Watts 70   Minutes 20   Biostep-RELP   Level 3   Watts 30   Minutes 20      Goals Met:  Independence with exercise equipment Exercise tolerated well Personal goals reviewed No report of cardiac concerns or symptoms Strength training completed today  Goals Unmet:  Not Applicable  Goals Comments: Discussed with patient adding one/day per  week exercise at home.  Patient completed exercise prescription and all exercise goals during rehab session. The exercise was tolerated well and the patient is progressing in the program.    Dr. Emily Filbert is Medical Director for [facility].

## 2015-06-23 ENCOUNTER — Encounter: Payer: Self-pay | Admitting: Cardiology

## 2015-06-27 DIAGNOSIS — Z951 Presence of aortocoronary bypass graft: Secondary | ICD-10-CM | POA: Diagnosis not present

## 2015-06-27 NOTE — Progress Notes (Signed)
Daily Session Note  Patient Details  Name: [name] MRN: [id number] Date of Birth: 12/29/1945 Referring Provider:  Thayer Headings, MD  Encounter Date: 06/27/2015  Check In:     Session Check In - 06/27/15 1609    Check-In   Staff Present Heath Lark, RN, BSN, CCRP;Carroll Enterkin, RN, BSN;Raquel Racey, BS, ACSM EP-C, Exercise Physiologist   ER physicians immediately available to respond to emergencies See telemetry face sheet for immediately available ER MD   Medication changes reported     No   Fall or balance concerns reported    No   Warm-up and Cool-down Performed on first and last piece of equipment   VAD Patient? No   Pain Assessment   Currently in Pain? No/denies         Goals Met:  Proper associated with RPD/PD & O2 Sat Exercise tolerated well No report of cardiac concerns or symptoms Strength training completed today  Goals Unmet:  Not Applicable  Goals Comments:    Dr. Emily Filbert is Medical Director for [facility].

## 2015-06-29 ENCOUNTER — Encounter: Payer: Self-pay | Admitting: Internal Medicine

## 2015-06-29 ENCOUNTER — Encounter: Payer: Medicare Other | Admitting: *Deleted

## 2015-06-29 ENCOUNTER — Ambulatory Visit: Payer: Self-pay | Admitting: Internal Medicine

## 2015-06-29 ENCOUNTER — Ambulatory Visit (INDEPENDENT_AMBULATORY_CARE_PROVIDER_SITE_OTHER): Payer: Medicare Other | Admitting: Internal Medicine

## 2015-06-29 ENCOUNTER — Ambulatory Visit: Payer: Medicare Other | Admitting: Cardiology

## 2015-06-29 VITALS — BP 103/64 | HR 85 | Temp 98.2°F | Ht 70.0 in | Wt 204.1 lb

## 2015-06-29 DIAGNOSIS — Z951 Presence of aortocoronary bypass graft: Secondary | ICD-10-CM

## 2015-06-29 DIAGNOSIS — I200 Unstable angina: Secondary | ICD-10-CM

## 2015-06-29 DIAGNOSIS — G4700 Insomnia, unspecified: Secondary | ICD-10-CM

## 2015-06-29 DIAGNOSIS — I480 Paroxysmal atrial fibrillation: Secondary | ICD-10-CM | POA: Diagnosis not present

## 2015-06-29 DIAGNOSIS — I10 Essential (primary) hypertension: Secondary | ICD-10-CM | POA: Diagnosis not present

## 2015-06-29 DIAGNOSIS — R5382 Chronic fatigue, unspecified: Secondary | ICD-10-CM | POA: Diagnosis not present

## 2015-06-29 LAB — LIPID PANEL
Cholesterol: 126 mg/dL (ref 0–200)
HDL: 34.3 mg/dL — ABNORMAL LOW (ref >39.00)
LDL Cholesterol: 53 mg/dL (ref 0–99)
NonHDL: 91.28
Total CHOL/HDL Ratio: 4
Triglycerides: 191 mg/dL — ABNORMAL HIGH (ref 0.0–149.0)
VLDL: 38.2 mg/dL (ref 0.0–40.0)

## 2015-06-29 LAB — CBC WITH DIFFERENTIAL/PLATELET
Basophils Absolute: 0 10*3/uL (ref 0.0–0.1)
Basophils Relative: 0.3 % (ref 0.0–3.0)
Eosinophils Absolute: 0.2 10*3/uL (ref 0.0–0.7)
Eosinophils Relative: 3.5 % (ref 0.0–5.0)
HCT: 39.9 % (ref 39.0–52.0)
Hemoglobin: 13 g/dL (ref 13.0–17.0)
Lymphocytes Relative: 28.1 % (ref 12.0–46.0)
Lymphs Abs: 1.6 10*3/uL (ref 0.7–4.0)
MCHC: 32.7 g/dL (ref 30.0–36.0)
MCV: 91.6 fl (ref 78.0–100.0)
Monocytes Absolute: 1.2 10*3/uL — ABNORMAL HIGH (ref 0.1–1.0)
Monocytes Relative: 21 % — ABNORMAL HIGH (ref 3.0–12.0)
Neutro Abs: 2.6 10*3/uL (ref 1.4–7.7)
Neutrophils Relative %: 47.1 % (ref 43.0–77.0)
Platelets: 166 10*3/uL (ref 150.0–400.0)
RBC: 4.36 Mil/uL (ref 4.22–5.81)
RDW: 13.6 % (ref 11.5–15.5)
WBC: 5.5 10*3/uL (ref 4.0–10.5)

## 2015-06-29 LAB — COMPREHENSIVE METABOLIC PANEL
ALT: 51 U/L (ref 0–53)
AST: 32 U/L (ref 0–37)
Albumin: 3.7 g/dL (ref 3.5–5.2)
Alkaline Phosphatase: 145 U/L — ABNORMAL HIGH (ref 39–117)
BUN: 24 mg/dL — ABNORMAL HIGH (ref 6–23)
CO2: 25 mEq/L (ref 19–32)
Calcium: 8.8 mg/dL (ref 8.4–10.5)
Chloride: 104 mEq/L (ref 96–112)
Creatinine, Ser: 1.14 mg/dL (ref 0.40–1.50)
GFR: 67.3 mL/min (ref >60.00)
Glucose, Bld: 132 mg/dL — ABNORMAL HIGH (ref 70–99)
Potassium: 4.3 mEq/L (ref 3.5–5.1)
Sodium: 138 mEq/L (ref 135–145)
Total Bilirubin: 0.5 mg/dL (ref 0.2–1.2)
Total Protein: 6.5 g/dL (ref 6.0–8.3)

## 2015-06-29 LAB — VITAMIN D 25 HYDROXY (VIT D DEFICIENCY, FRACTURES): VITD: 31.66 ng/mL (ref 30.00–100.00)

## 2015-06-29 LAB — VITAMIN B12: Vitamin B-12: 459 pg/mL (ref 211–911)

## 2015-06-29 LAB — TESTOSTERONE: Testosterone: 158.48 ng/dL — ABNORMAL LOW (ref 300.00–890.00)

## 2015-06-29 LAB — TSH: TSH: 3.75 u[IU]/mL (ref 0.35–4.50)

## 2015-06-29 MED ORDER — TRAZODONE HCL 50 MG PO TABS: 25.0000 mg | ORAL_TABLET | Freq: Every evening | ORAL | Status: DC | PRN

## 2015-06-29 NOTE — Assessment & Plan Note (Signed)
Monitored by Dr. Hart RochesterLawson from vascular surgery. Starting back on Plavix. As far as I can tell, he has not noted any claudication. He has not yet really started exercising a lot since his surgery.

## 2015-06-29 NOTE — Progress Notes (Signed)
Daily Session Note  Patient Details  Name: Camron Monday MRN: [id number] Date of Birth: 22-Aug-1945 Referring Provider:  Thayer Headings, MD  Encounter Date: 06/29/2015  Check In:     Session Check In - 06/29/15 1603    Check-In   Staff Present Heath Lark, RN, BSN, CCRP;Carroll Enterkin, RN, Drusilla Kanner, MS, ACSM CEP, Exercise Physiologist   ER physicians immediately available to respond to emergencies See telemetry face sheet for immediately available ER MD   Medication changes reported     No   Fall or balance concerns reported    No   Warm-up and Cool-down Performed on first and last piece of equipment   VAD Patient? No   Pain Assessment   Currently in Pain? No/denies         Goals Met:  Independence with exercise equipment Exercise tolerated well No report of cardiac concerns or symptoms Strength training completed today  Goals Unmet:  Not Applicable  Goals Comments: Progressing well with ex RX   Dr. Emily Filbert is Medical Director for [facility].

## 2015-06-29 NOTE — Assessment & Plan Note (Signed)
BP Readings from Last 3 Encounters:  06/29/15 103/64  06/21/15 110/64  06/13/15 110/69   BP well controlled. Renal function with labs today.

## 2015-06-29 NOTE — Assessment & Plan Note (Signed)
Increased to atorvastatin by PCP. No longer smoking.

## 2015-06-29 NOTE — Assessment & Plan Note (Signed)
Status post CABG. Started on cardiac rehabilitation. Now back on beta blocker as amiodarone being stopped. On high dose statin.

## 2015-06-29 NOTE — Assessment & Plan Note (Signed)
Symptoms of insomnia likely exacerbated by frequent urination. Encouraged follow up with urology. Will start Trazodone 25-50mg  daily. Follow up by email and in 4 weeks.

## 2015-06-29 NOTE — Assessment & Plan Note (Signed)
Convert to higher potency atorvastatin from simvastatin. Labs monitored by PCP.

## 2015-06-29 NOTE — Patient Instructions (Signed)
Start Trazodone 25-50mg  at bedtime to help with sleep.  Labs today.  Follow up in 4 weeks.

## 2015-06-29 NOTE — Progress Notes (Signed)
Subjective:    Patient ID: [name], male    DOB: January 16, 1946, 69 y.o.   MRN: [id number]  HPI  68YO male presents for follow up.  Recently seen by cardiac surgeon for chest wall pain. Instructed to use Tylenol for pain and started back on Metoprolol.  Fatigue - Over last few weeks has felt more fatigued. Does not feel depressed. Waking several times per night to urinate. Last night took Zyquil and slept 10pm until 3am. No chest pain, dyspnea. Feeling very weak. For example, has trouble getting up from kneeling on floor. Doing cardiopulmonary rehab. Walking on treadmill .75 to one mile. Started some incline. Appetite good. No change in bowel habits.     Wt Readings from Last 3 Encounters:  06/29/15 204 lb 2 oz (92.59 kg)  06/21/15 204 lb 11.2 oz (92.851 kg)  06/13/15 206 lb 4.8 oz (93.577 kg)   BP Readings from Last 3 Encounters:  06/29/15 103/64  06/21/15 110/64  06/13/15 110/69    Past Medical History  Diagnosis Date  . Peripheral vascular disease (HCC)     Bilateral ABIs 0.8  . Chronic distal aortic occlusion (HCC)     By report  . Diabetes mellitus     By his report, this is prediabetes   . SOB (shortness of breath)   . Hyperlipidemia LDL goal <70   . Essential hypertension   . Weakness   . Erectile dysfunction   . Hypogonadism in male     Previously on testosterone injections, but has not yet restarted until further testing  . Enlarged prostate   . Arthritis   . Depression   . History of kidney stones   . Muscle pain   . CAD, multiple vessel 03/2105    LM 65%, mLAD 70%, 90% & 80%, o-pCx 75%, mCx 95% & d Cx 70%, OM1 60%, mRCA 90% with 100% dRCA. R-R collaterals  . S/P CABG x 5 04/04/2015    LIMA to LAD, SVG to DIAG, SVG to OM, seqSVG -PDA- dCx  . PAF (paroxysmal atrial fibrillation) (HCC) 03/2015    Noted per-cath & post-op CABG.   Family History  Problem Relation Age of Onset  . Coronary artery disease Father   . Heart disease Father   . Stroke  Mother   . Stroke Sister   . Prostate cancer Neg Hx   . Kidney disease Neg Hx    Past Surgical History  Procedure Laterality Date  . Cardiac catheterization  1992  . Ventral hernia repair  1994    complicated by infection  . Cholecystectomy  2000    also had revision of hernia repair  . Cystectomy      face and scrotum  . Blepharoplasty    . Lexiscan myoview  June 2010    [facility] to inability to reach target heart rate) --the study was normal with no evidence of ischemia and normal LV function. EF 7  . Cardiac catheterization N/A 03/31/2015    Procedure: Left Heart Cath and Coronary Angiography;  Surgeon: Marykay Lexavid W Harding, MD;  [facility];  Service: Cardiovascular;  Laterality: N/A;  . Coronary artery bypass graft N/A 04/04/2015    Procedure: CORONARY ARTERY BYPASS GRAFTING (CABG);  Surgeon: Delight OvensEdward B Gerhardt, MD;  [facility];  Service: Open Heart Surgery;  Laterality: N/A;  . Tee without cardioversion N/A 04/04/2015    Procedure: TRANSESOPHAGEAL ECHOCARDIOGRAM (TEE);  Surgeon: Delight OvensEdward B Gerhardt, MD;  Location:  [facility] OR;  Service: Open Heart Surgery;  Laterality: N/A;   Social History   Social History  . Marital Status: Married    Spouse Name: N/A  . Number of Children: N/A  . Years of Education: N/A   Social History Main Topics  . Smoking status: Former Smoker -- 2.00 packs/day for 40 years    Types: Cigarettes    Quit date: 10/17/1992  . Smokeless tobacco: Never Used     Comment: quit 1994  . Alcohol Use: 0.0 oz/week    0 Standard drinks or equivalent per week     Comment: seldom  . Drug Use: No  . Sexual Activity: Not Asked   Other Topics Concern  . None   Social History Narrative   Lives in [place] with wife. No pets   Two children.      Former smoker. Quit in 1994. Prior to quitting was unable to walk without exertional dyspnea.      Work - Retired. 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      Hobbies - reading, drives Corvette, chuch    Review of Systems  Constitutional: Positive for fatigue. Negative for fever, chills, activity change, appetite change and unexpected weight change.  Eyes: Negative for visual disturbance.  Respiratory: Negative for cough and shortness of breath.   Cardiovascular: Positive for chest pain (chest wall pain). Negative for palpitations and leg swelling.  Gastrointestinal: Negative for nausea, vomiting, abdominal pain, diarrhea, constipation, blood in stool and abdominal distention.  Genitourinary: Negative for dysuria, urgency and difficulty urinating.  Musculoskeletal: Positive for myalgias. Negative for arthralgias and gait problem.  Skin: Negative for color change and rash.  Neurological: Positive for weakness and numbness (across chest wall).  Hematological: Negative for adenopathy.  Psychiatric/Behavioral: Positive for sleep disturbance. Negative for dysphoric mood. The patient is not nervous/anxious.        Objective:    BP 103/64 mmHg  Pulse 85  Temp(Src) 98.2 F (36.8 C) (Oral)  Ht  (1.778 m)  Wt 204 lb 2 oz (92.59 kg)  BMI 29.29 kg/m2  SpO2 95% Physical Exam  Constitutional: He is oriented to person, place, and time. He appears well-developed and well-nourished. No distress.  HENT:  Head: Normocephalic and atraumatic.  Right Ear: External ear normal.  Left Ear: External ear normal.  Nose: Nose normal.  Mouth/Throat: Oropharynx is clear and moist. No oropharyngeal exudate.  Eyes: Conjunctivae and EOM are normal. Pupils are equal, round, and reactive to light. Right eye exhibits no discharge. Left eye exhibits no discharge. No scleral icterus.  Neck: Normal range of motion. Neck supple. No tracheal deviation present. No thyromegaly present.  Cardiovascular: Normal rate, regular rhythm and normal heart sounds.  Exam reveals no gallop and no friction rub.   No murmur  heard. Pulmonary/Chest: Effort normal and breath sounds normal. No accessory muscle usage. No tachypnea. No respiratory distress. He has no decreased breath sounds. He has no wheezes. He has no rhonchi. He has no rales. He exhibits no tenderness.  Musculoskeletal: Normal range of motion. He exhibits no edema.  Lymphadenopathy:    He has no cervical adenopathy.  Neurological: He is alert and oriented to person, place, and time. No cranial nerve deficit. Coordination normal.  Skin: Skin is warm and dry. No rash noted. He is not diaphoretic. No erythema. No pallor.  Psychiatric: He has a normal mood and affect. His behavior is normal. Judgment and  thought content normal.          Assessment & Plan:   Problem List Items Addressed This Visit      Unprioritized   Chronic fatigue - Primary    Recent worsening fatigue. No focal symptoms. Exam normal. Will check labs as ordered. Starting Trazodone to help with insomnia. Follow up in 4 weeks and prn.      Relevant Orders   CBC with Differential/Platelet   Comprehensive metabolic panel   Lipid panel   TSH   VITAMIN D 25 Hydroxy (Vit-D Deficiency, Fractures)   B12   Testosterone   Testosterone, free   Testosterone, % free   Sex hormone binding globulin   Essential hypertension (Chronic)    BP Readings from Last 3 Encounters:  06/29/15 103/64  06/21/15 110/64  06/13/15 110/69   BP well controlled. Renal function with labs today.      Insomnia    Symptoms of insomnia likely exacerbated by frequent urination. Encouraged follow up with urology. Will start Trazodone 25-50mg  daily. Follow up by email and in 4 weeks.      Relevant Medications   traZODone (DESYREL) 50 MG tablet   PAF (paroxysmal atrial fibrillation) (HCC) (Chronic)    NSR on exam today. Rate controlled with Metoprolol. Will continue to monitor.          Return in about 4 weeks (around 07/27/2015) for Recheck.

## 2015-06-29 NOTE — Assessment & Plan Note (Signed)
No further angina postop. He is on statin and now restarting beta blocker. Cardiac rehabilitation for postop.

## 2015-06-29 NOTE — Assessment & Plan Note (Signed)
Recent worsening fatigue. No focal symptoms. Exam normal. Will check labs as ordered. Starting Trazodone to help with insomnia. Follow up in 4 weeks and prn.

## 2015-06-29 NOTE — Progress Notes (Signed)
Pre visit review using our clinic review tool, if applicable. No additional management support is needed unless otherwise documented below in the visit note. 

## 2015-06-29 NOTE — Assessment & Plan Note (Signed)
NSR on exam today. Rate controlled with Metoprolol. Will continue to monitor.

## 2015-06-29 NOTE — Assessment & Plan Note (Signed)
Still noticing a lot of musculoskeletal chest wall pain from his bypass surgery. I  Reassured him that these are normal postop symptoms and should get better. He can use Tylenol or even occasional ibuprofen as needed.

## 2015-06-29 NOTE — Assessment & Plan Note (Signed)
Improved blood pressure. Beta blocker had been fully stopped. I think now he should be okay with starting it back at low dose.

## 2015-06-29 NOTE — Assessment & Plan Note (Signed)
Noted during the time his cardiac catheterization prior to his CABG. Did not have any reoccurrence following bypass surgery. We will wean off amiodarone after current bottle. We'll then restart low-dose beta blocker 12.5 mg twice a day. He was also taking this but has not been.  He is reluctant to the idea of full anticoagulation. I would see if he has any recurrence now that he is for now we will simply stop aspirin and restart Plavix 75 mg daily. If he has any further episodes, we would then need to start full anticoagulation.

## 2015-06-30 DIAGNOSIS — Z951 Presence of aortocoronary bypass graft: Secondary | ICD-10-CM

## 2015-06-30 NOTE — Progress Notes (Signed)
Daily Session Note  Patient Details  Name: [name] MRN: [id number] Date of Birth: [birth year] Referring Provider:  Thayer Headings, MD  Encounter Date: 06/30/2015  Check In:     Session Check In - 06/30/15 1639    Check-In   Staff Present Gerlene Burdock, RN, BSN;Mary Kellie Shropshire, RN;Max Nuno, BS, ACSM EP-C, Exercise Physiologist   ER physicians immediately available to respond to emergencies See telemetry face sheet for immediately available ER MD   Medication changes reported     No   Fall or balance concerns reported    No   Warm-up and Cool-down Performed on first and last piece of equipment   VAD Patient? No   Pain Assessment   Currently in Pain? No/denies         Goals Met:  Proper associated with RPD/PD & O2 Sat Exercise tolerated well No report of cardiac concerns or symptoms Strength training completed today  Goals Unmet:  Not Applicable  Goals Comments:   Dr. Emily Filbert is Medical Director for [facility].

## 2015-07-01 LAB — TESTOSTERONE, FREE, TOTAL, SHBG
Sex Hormone Binding: 63 nmol/L (ref 22–77)
Testosterone, Free: 20.4 pg/mL — ABNORMAL LOW (ref 47.0–244.0)
Testosterone-% Free: 1.2 % — ABNORMAL LOW (ref 1.6–2.9)
Testosterone: 168 ng/dL — ABNORMAL LOW (ref 300–890)

## 2015-07-04 ENCOUNTER — Telehealth: Payer: Self-pay | Admitting: *Deleted

## 2015-07-04 ENCOUNTER — Encounter: Payer: Self-pay | Admitting: Internal Medicine

## 2015-07-04 DIAGNOSIS — M25552 Pain in left hip: Principal | ICD-10-CM

## 2015-07-04 DIAGNOSIS — M25551 Pain in right hip: Secondary | ICD-10-CM

## 2015-07-04 NOTE — Telephone Encounter (Signed)
Pt states that he was in last week to see you and he mentioned the pain in his hip to you, wants an xray...please advise....Maeola Sarah/tvw

## 2015-07-04 NOTE — Telephone Encounter (Signed)
Noted , appointment made for Wed....[name]/tvw

## 2015-07-04 NOTE — Telephone Encounter (Signed)
Can we schedule him at 2:30pm on Tuesday for 30min?

## 2015-07-04 NOTE — Telephone Encounter (Signed)
Marc FellersFrank called and said is having problems even getting into a car because he woke up this am with his hip really hurting him.

## 2015-07-05 ENCOUNTER — Ambulatory Visit (INDEPENDENT_AMBULATORY_CARE_PROVIDER_SITE_OTHER)
Admission: RE | Admit: 2015-07-05 | Discharge: 2015-07-05 | Disposition: A | Discharge status: Home / Self Care | Payer: Medicare Other | Source: Ambulatory Visit | Attending: Internal Medicine | Admitting: Internal Medicine

## 2015-07-05 DIAGNOSIS — M25552 Pain in left hip: Secondary | ICD-10-CM

## 2015-07-05 DIAGNOSIS — M160 Bilateral primary osteoarthritis of hip: Secondary | ICD-10-CM | POA: Diagnosis not present

## 2015-07-05 DIAGNOSIS — M25551 Pain in right hip: Secondary | ICD-10-CM | POA: Diagnosis not present

## 2015-07-06 ENCOUNTER — Ambulatory Visit (INDEPENDENT_AMBULATORY_CARE_PROVIDER_SITE_OTHER): Payer: Medicare Other | Admitting: Internal Medicine

## 2015-07-06 ENCOUNTER — Encounter: Payer: Self-pay | Admitting: Internal Medicine

## 2015-07-06 VITALS — BP 92/54 | HR 73 | Temp 98.4°F | Ht 70.0 in | Wt 208.0 lb

## 2015-07-06 DIAGNOSIS — G4700 Insomnia, unspecified: Secondary | ICD-10-CM

## 2015-07-06 DIAGNOSIS — I200 Unstable angina: Secondary | ICD-10-CM | POA: Diagnosis not present

## 2015-07-06 DIAGNOSIS — M25552 Pain in left hip: Secondary | ICD-10-CM

## 2015-07-06 MED ORDER — TRAMADOL HCL 50 MG PO TABS: 50.0000 mg | ORAL_TABLET | Freq: Three times a day (TID) | ORAL | Status: DC | PRN

## 2015-07-06 NOTE — Progress Notes (Signed)
Pre visit review using our clinic review tool, if applicable. No additional management support is needed unless otherwise documented below in the visit note. 

## 2015-07-06 NOTE — Patient Instructions (Signed)
Start Tramadol 50-100mg  up to every 8hr as needed for pain.  We will set up evaluation with sports medicine.

## 2015-07-06 NOTE — Progress Notes (Signed)
Subjective:    Patient ID: [name], male    DOB: 1946/04/13, 69 y.o.   MRN: [id number]  HPI  69YO male presents for acute visit.  Hip pain - Bilateral hip pain. Seemed to get much worse last Friday. Left>right. Feels like a bruise lateral left hip. Sore to touch. Improved with avoiding movement. Worsened with movement, particularly walking. No known trauma to hip. Pain does not radiate. No weakness or numbness. Taking occasional Tylenol with no improvement.  Insomnia - Symptoms improved with Trazodone 50mg  dosing. However currently taking some Nyquil given nasal congestion, and symptoms also improved with Nyquil alone.   Wt Readings from Last 3 Encounters:  07/06/15 208 lb (94.348 kg)  06/29/15 204 lb 2 oz (92.59 kg)  06/21/15 204 lb 11.2 oz (92.851 kg)   BP Readings from Last 3 Encounters:  07/06/15 92/54  06/29/15 103/64  06/21/15 110/64    Past Medical History  Diagnosis Date  . Peripheral vascular disease (HCC)     Bilateral ABIs 0.8  . Chronic distal aortic occlusion (HCC)     By report  . Diabetes mellitus     By his report, this is prediabetes   . SOB (shortness of breath)   . Hyperlipidemia LDL goal <70   . Essential hypertension   . Weakness   . Erectile dysfunction   . Hypogonadism in male     Previously on testosterone injections, but has not yet restarted until further testing  . Enlarged prostate   . Arthritis   . Depression   . History of kidney stones   . Muscle pain   . CAD, multiple vessel 03/2105    LM 65%, mLAD 70%, 90% & 80%, o-pCx 75%, mCx 95% & d Cx 70%, OM1 60%, mRCA 90% with 100% dRCA. R-R collaterals  . S/P CABG x 5 04/04/2015    LIMA to LAD, SVG to DIAG, SVG to OM, seqSVG -PDA- dCx  . PAF (paroxysmal atrial fibrillation) (HCC) 03/2015    Noted per-cath & post-op CABG.   Family History  Problem Relation Age of Onset  . Coronary artery disease Father   . Heart disease Father   . Stroke Mother   . Stroke Sister   . Prostate cancer  Neg Hx   . Kidney disease Neg Hx    Past Surgical History  Procedure Laterality Date  . Cardiac catheterization  1992  . Ventral hernia repair  1994    complicated by infection  . Cholecystectomy  2000    also had revision of hernia repair  . Cystectomy      face and scrotum  . Blepharoplasty    . Lexiscan myoview  June 2010    [facility] to inability to reach target heart rate) --the study was normal with no evidence of ischemia and normal LV function. EF 7  . Cardiac catheterization N/A 03/31/2015    Procedure: Left Heart Cath and Coronary Angiography;  Surgeon: Marykay Lexavid W Harding, MD;  [facility];  Service: Cardiovascular;  Laterality: N/A;  . Coronary artery bypass graft N/A 04/04/2015    Procedure: CORONARY ARTERY BYPASS GRAFTING (CABG);  Surgeon: Delight OvensEdward B Gerhardt, MD;  [facility];  Service: Open Heart Surgery;  Laterality: N/A;  . Tee without cardioversion N/A 04/04/2015    Procedure: TRANSESOPHAGEAL ECHOCARDIOGRAM (TEE);  Surgeon: Delight OvensEdward B Gerhardt, MD;  [facility];  Service: Open Heart Surgery;  Laterality: N/A;   Social History  Social History  . Marital Status: Married    Spouse Name: N/A  . Number of Children: N/A  . Years of Education: N/A   Social History Main Topics  . Smoking status: Former Smoker -- 2.00 packs/day for 40 years    Types: Cigarettes    Quit date: 10/17/1992  . Smokeless tobacco: Never Used     Comment: quit 1994  . Alcohol Use: 0.0 oz/week    0 Standard drinks or equivalent per week     Comment: seldom  . Drug Use: No  . Sexual Activity: Not Asked   Other Topics Concern  . None   Social History Narrative   Lives in [place] with wife. No pets   Two children.      Former smoker. Quit in 1994. Prior to quitting was unable to walk without exertional dyspnea.      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       Hobbies - reading, drives Corvette, chuch    Review of Systems  Constitutional: Negative for fever, chills, activity change, appetite change, fatigue and unexpected weight change.  Eyes: Negative for visual disturbance.  Respiratory: Negative for cough and shortness of breath.   Cardiovascular: Negative for chest pain, palpitations and leg swelling.  Gastrointestinal: Negative for abdominal pain and abdominal distention.  Genitourinary: Negative for dysuria, urgency and difficulty urinating.  Musculoskeletal: Positive for myalgias, arthralgias and gait problem.  Skin: Negative for color change and rash.  Hematological: Negative for adenopathy.  Psychiatric/Behavioral: Positive for sleep disturbance. Negative for dysphoric mood. The patient is not nervous/anxious.        Objective:    BP 92/54 mmHg  Pulse 73  Temp(Src) 98.4 F (36.9 C) (Oral)  Ht  (1.778 m)  Wt 208 lb (94.348 kg)  BMI 29.84 kg/m2  SpO2 95% Physical Exam  Constitutional: He is oriented to person, place, and time. He appears well-developed and well-nourished. No distress.  HENT:  Head: Normocephalic and atraumatic.  Eyes: Conjunctivae and EOM are normal. Pupils are equal, round, and reactive to light.  Neck: Normal range of motion.  Pulmonary/Chest: Effort normal.  Musculoskeletal: He exhibits tenderness. He exhibits no edema.       Left hip: He exhibits tenderness. He exhibits normal range of motion and no swelling.       Legs: Neurological: He is alert and oriented to person, place, and time. He has normal reflexes.  Skin: He is not diaphoretic.          Assessment & Plan:   Problem List Items Addressed This Visit      Unprioritized   Insomnia    Symptoms improved with prn Trazodone. Will continue.      Left hip pain - Primary    Left lateral hip pain and tenderness on exam. Concerning for bursitis. Avoiding NSAIDS because of recent CABG. Will start prn Tramadol for pain. Will set up sports  medicine evaluation. Question if local steroid injection might be helpful.      Relevant Orders   Ambulatory referral to Sports Medicine       Return if symptoms worsen or fail to improve.

## 2015-07-06 NOTE — Assessment & Plan Note (Signed)
Symptoms improved with prn Trazodone. Will continue.

## 2015-07-06 NOTE — Assessment & Plan Note (Signed)
Left lateral hip pain and tenderness on exam. Concerning for bursitis. Avoiding NSAIDS because of recent CABG. Will start prn Tramadol for pain. Will set up sports medicine evaluation. Question if local steroid injection might be helpful.

## 2015-07-15 ENCOUNTER — Ambulatory Visit (INDEPENDENT_AMBULATORY_CARE_PROVIDER_SITE_OTHER): Payer: Medicare Other | Admitting: Family Medicine

## 2015-07-15 ENCOUNTER — Other Ambulatory Visit (INDEPENDENT_AMBULATORY_CARE_PROVIDER_SITE_OTHER): Payer: Medicare Other

## 2015-07-15 ENCOUNTER — Encounter: Payer: Self-pay | Admitting: Family Medicine

## 2015-07-15 VITALS — BP 112/62 | HR 70 | Ht 69.5 in | Wt 208.0 lb

## 2015-07-15 DIAGNOSIS — M7061 Trochanteric bursitis, right hip: Secondary | ICD-10-CM | POA: Diagnosis not present

## 2015-07-15 DIAGNOSIS — M25552 Pain in left hip: Secondary | ICD-10-CM

## 2015-07-15 DIAGNOSIS — I200 Unstable angina: Secondary | ICD-10-CM | POA: Diagnosis not present

## 2015-07-15 NOTE — Progress Notes (Signed)
Pre visit review using our clinic review tool, if applicable. No additional management support is needed unless otherwise documented below in the visit note. 

## 2015-07-15 NOTE — Progress Notes (Signed)
Marc ScaleZach [name] D.O. [facility] [address] [place], [place] [address] Phone: [phone] Subjective:    I'm seeing this patient by the request  of:  Marc DoveWALKER,[name]   CC: Left hip pain  BJY:NWGNFAOZHYHPI:Subjective Marc HartFrank [name] is a 69 y.o. male coming in with complaint of left hip pain. Has been multiple months. Seems to be worsening. Patient did have a recent CABG in since that has not been able to work out regularly. States that even with a lot of walking he has pain on the lateral aspect of the hip. Seems even wake him up at night. Denies any groin pain. No severe radiation down the leg. Seems to be localized. Unable to take anti-inflammatories. Patient has not tried any other significant home modalities. Over the course last 3 days has decrease his activity and since then it seems to be somewhat better. [facility] pain was 8 out of 10 but now is approximate 4 out of 10. No numbness of the leg or weakness. Does not remember any true injury.  Past Medical History  Diagnosis Date  . Peripheral vascular disease (HCC)     Bilateral ABIs 0.8  . Chronic distal aortic occlusion (HCC)     By report  . Diabetes mellitus     By his report, this is prediabetes   . SOB (shortness of breath)   . Hyperlipidemia LDL goal <70   . Essential hypertension   . Weakness   . Erectile dysfunction   . Hypogonadism in male     Previously on testosterone injections, but has not yet restarted until further testing  . Enlarged prostate   . Arthritis   . Depression   . History of kidney stones   . Muscle pain   . CAD, multiple vessel 03/2105    LM 65%, mLAD 70%, 90% & 80%, o-pCx 75%, mCx 95% & d Cx 70%, OM1 60%, mRCA 90% with 100% dRCA. R-R collaterals  . S/P CABG x 5 04/04/2015    LIMA to LAD, SVG to DIAG, SVG to OM, seqSVG -PDA- dCx  . PAF (paroxysmal atrial fibrillation) (HCC) 03/2015    Noted per-cath & post-op CABG.   Past Surgical History  Procedure Laterality Date  .  Cardiac catheterization  1992  . Ventral hernia repair  1994    complicated by infection  . Cholecystectomy  2000    also had revision of hernia repair  . Cystectomy      face and scrotum  . Blepharoplasty    . Lexiscan myoview  June 2010    [facility] inability to reach target heart rate) --the study was normal with no evidence of ischemia and normal LV function. EF 7  . Cardiac catheterization N/A 03/31/2015    Procedure: Left Heart Cath and Coronary Angiography;  Surgeon: Marykay Lexavid W Harding, [name];  [facility];  Service: Cardiovascular;  Laterality: N/A;  . Coronary artery bypass graft N/A 04/04/2015    Procedure: CORONARY ARTERY BYPASS GRAFTING (CABG);  Surgeon: Delight OvensEdward B Gerhardt, [name];  [facility];  Service: Open Heart Surgery;  Laterality: N/A;  . Tee without cardioversion N/A 04/04/2015    Procedure: TRANSESOPHAGEAL ECHOCARDIOGRAM (TEE);  Surgeon: Delight OvensEdward B Gerhardt, [name];  [facility];  Service: Open Heart Surgery;  Laterality: N/A;   Social History   Social History  . Marital Status: Married    Spouse Name: N/A  . Number of Children: N/A  .  Years of Education: N/A   Occupational History  . Not on file.   Social History Main Topics  . Smoking status: Former Smoker -- 2.00 packs/day for 40 years    Types: Cigarettes    Quit date: 10/17/1992  . Smokeless tobacco: Never Used     Comment: quit 1994  . Alcohol Use: 0.0 oz/week    0 Standard drinks or equivalent per week     Comment: seldom  . Drug Use: No  . Sexual Activity: Not on file   Other Topics Concern  . Not on file   Social History Narrative   Lives in [place] with wife. No pets   Two children.      Former smoker. Quit in 1994. Prior to quitting was unable to walk without exertional dyspnea.      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      Hobbies - reading, drives Corvette, chuch    No Known Allergies Family History  Problem Relation Age of Onset  . Coronary artery disease Father   . Heart disease Father   . Stroke Mother   . Stroke Sister   . Prostate cancer Neg Hx   . Kidney disease Neg Hx         Past medical history, social, surgical and family history all reviewed in electronic medical record.   Review of Systems: No headache, visual changes, nausea, vomiting, diarrhea, constipation, dizziness, abdominal pain, skin rash, fevers, chills, night sweats, weight loss, swollen lymph nodes, body aches, joint swelling, muscle aches, chest pain, shortness of breath, mood changes.   Objective Blood pressure 112/62, pulse 70, height 5' 9.5" (1.765 m), weight 208 lb (94.348 kg), SpO2 96 %.  General: No apparent distress alert and oriented x3 mood and affect normal, dressed appropriately.  HEENT: Pupils equal, extraocular movements intact  Respiratory: Patient's speak in full sentences and does not appear short of breath  Cardiovascular: No lower extremity edema, non tender, no erythema  Skin: Warm dry intact with no signs of infection or rash on extremities or on axial skeleton.  Abdomen: Soft nontender  Neuro: Cranial nerves II through XII are intact, neurovascularly intact in all extremities with 2+ DTRs and 2+ pulses.  Lymph: No lymphadenopathy of posterior or anterior cervical chain or axillae bilaterally.  Gait normal with good balance and coordination.  MSK:  Non tender with full range of motion and good stability and symmetric strength and tone of shoulders, elbows, wrist,  knee and ankles bilaterally.  Hip: Left ROM IR: 25 Deg, ER: 25 Deg, Flexion: 120 Deg, Extension: 100 Deg, Abduction: 45 Deg, Adduction: 45 Deg Strength IR: 5/5, ER: 5/5, Flexion: 5/5, Extension: 5/5, Abduction: 4/5, Adduction: 5/5 Pelvic alignment unremarkable to inspection and palpation. Standing hip rotation and gait without trendelenburg sign / unsteadiness. Greater trochanter with  severe tenderness to palpation. No tenderness over piriformis  Positive Faber No SI joint tenderness and normal minimal SI movement.   MSK US performed of: Left This study was ordered, performed, and interpreted by Terrilee Files D.O.  Hip: Trochanteric bursa with significant hypoechoic changes and swelling Acetabular labrum visualized and without tears, displacement, or effusion in joint. Femoral neck appears unremarkable without increased power doppler signal along Cortex.  IMPRESSION:  Greater trochanter bursitis   Procedure: Real-time Ultrasound Guided Injection of left greater trochanteric bursitis secondary to patient's body habitus Device: GE Logiq E  Ultrasound guided injection is preferred  based studies that show increased duration, increased effect, greater accuracy, decreased procedural pain, increased response rate, and decreased cost with ultrasound guided versus blind injection.  Verbal informed consent obtained.  Time-out conducted.  Noted no overlying erythema, induration, or other signs of local infection.  Skin prepped in a sterile fashion.  Local anesthesia: Topical Ethyl chloride.  With sterile technique and under real time ultrasound guidance:  Greater trochanteric area was visualized and patient's bursa was noted. A 22-gauge 3 inch needle was inserted and 4 cc of 0.5% Marcaine and 1 cc of Kenalog 40 mg/dL was injected. Pictures taken Completed without difficulty  Pain immediately resolved suggesting accurate placement of the medication.  Advised to call if fevers/chills, erythema, induration, drainage, or persistent bleeding.  Images permanently stored and available for review in the ultrasound unit.  Impression: Technically successful ultrasound guided injection.   Procedure note 97110; 15 minutes spent for Therapeutic exercises as stated in above notes.  This included exercises focusing on stretching, strengthening, with significant focus on eccentric aspects.   Pelvic tilt/bracing to help with proper recruitment of the lower abs and pelvic floor muscles  Glute strengthening to properly contract glutes without over-engaging low back and hamstrings - prone hip extension and glute bridge exercises Proper stretching techniques to increase effectiveness for the hip flexors, groin, quads, piriformis and low back when appropriate  Proper technique shown and discussed handout in great detail with ATC.  All questions were discussed and answered.      Impression and Recommendations:     This case required medical decision making of moderate complexity.

## 2015-07-15 NOTE — Assessment & Plan Note (Signed)
Patient responded very well to the injection today. Due to patient's history of recent CABG this did limit certain medications that we could use in that when we decided to do the injection. Patient did tolerate the procedure very well. Patient will continue on his blood thinners. We did discuss the possibility of bruising. Patient will do icing and work with Event organiserathletic trainer to learn home exercises. Patient will start going to the gym on a more frequent basis. Patient given a trial of topical anti-inflammatory to use minimally. Patient and will come back and see me again in 4 weeks for further evaluation and treatment. No signs of lumbar radiculopathy today.

## 2015-07-15 NOTE — Patient Instructions (Signed)
Good to see you.  Ice 20 minutes 2 times daily. Usually after activity and before bed. pennsaid pinkie amount topically 2 times daily as needed. Try to only use it minimally.  Get back in the gym 3 times a week Exercises 3 times a week. Great for after walking or activity See me again in 4 weeks if not perfect Happy New Year!

## 2015-07-17 NOTE — Progress Notes (Signed)
Cardiac Individual Treatment Plan  Patient Details  Name: [name] MRN: [id number] Date of Birth: 1945-12-14 Referring Provider:  Thayer Headings, MD  Initial Encounter Date:    Visit Diagnosis: S/P CABG x 4  Patient's Home Medications on Admission:  Current outpatient prescriptions:  .  acetaminophen (TYLENOL) 650 MG CR tablet, Take 650 mg by mouth every 8 (eight) hours as needed., Disp: , Rfl:  .  atorvastatin (LIPITOR) 40 MG tablet, Take 1 tablet (40 mg total) by mouth daily., Disp: 90 tablet, Rfl: 3 .  clopidogrel (PLAVIX) 75 MG tablet, Take 1 tablet (75 mg total) by mouth daily., Disp: 90 tablet, Rfl: 3 .  fexofenadine (ALLEGRA) 180 MG tablet, Take 180 mg by mouth daily., Disp: , Rfl:  .  finasteride (PROSCAR) 5 MG tablet, Take 1 tablet (5 mg total) by mouth daily., Disp: 90 tablet, Rfl: 3 .  gabapentin (NEURONTIN) 400 MG capsule, Take 1 capsule (400 mg total) by mouth 3 (three) times daily., Disp: 270 capsule, Rfl: 3 .  metoprolol tartrate (LOPRESSOR) 25 MG tablet, Take 0.5 tablets (12.5 mg total) by mouth 2 (two) times daily., Disp: 90 tablet, Rfl: 3 .  Multiple Vitamin (MULTIVITAMIN) tablet, Take 1 tablet by mouth daily., Disp: , Rfl:  .  NITROSTAT 0.4 MG SL tablet, Place 0.4 mg under the tongue every 5 (five) minutes x 3 doses as needed. (CHEST PAIN), Disp: , Rfl:  .  traMADol (ULTRAM) 50 MG tablet, Take 1-2 tablets (50-100 mg total) by mouth every 8 (eight) hours as needed., Disp: 60 tablet, Rfl: 0 .  traZODone (DESYREL) 50 MG tablet, Take 0.5-1 tablets (25-50 mg total) by mouth at bedtime as needed for sleep., Disp: 30 tablet, Rfl: 3  Past Medical History: Past Medical History  Diagnosis Date  . Peripheral vascular disease (HCC)     Bilateral ABIs 0.8  . Chronic distal aortic occlusion (HCC)     By report  . Diabetes mellitus     By his report, this is prediabetes   . SOB (shortness of breath)   . Hyperlipidemia LDL goal <70   . Essential hypertension   . Weakness    . Erectile dysfunction   . Hypogonadism in male     Previously on testosterone injections, but has not yet restarted until further testing  . Enlarged prostate   . Arthritis   . Depression   . History of kidney stones   . Muscle pain   . CAD, multiple vessel 03/2105    LM 65%, mLAD 70%, 90% & 80%, o-pCx 75%, mCx 95% & d Cx 70%, OM1 60%, mRCA 90% with 100% dRCA. R-R collaterals  . S/P CABG x 5 04/04/2015    LIMA to LAD, SVG to DIAG, SVG to OM, seqSVG -PDA- dCx  . PAF (paroxysmal atrial fibrillation) ([facility]) 03/2015    Noted per-cath & post-op CABG.    Tobacco Use: History  Smoking status  . Former Smoker -- 2.00 packs/day for 40 years  . Types: Cigarettes  . Quit date: 10/17/1992  Smokeless tobacco  . Never Used    Comment: quit 1994    Labs: Recent Review Flowsheet Data    Labs for ITP Cardiac and Pulmonary Rehab Latest Ref Rng 04/04/2015 04/04/2015 04/05/2015 04/06/2015 06/29/2015   Cholestrol 0 - 200 mg/dL - - - - 126   LDLCALC 0 - 99 mg/dL - - - - 53   HDL >39.00 mg/dL - - - - 34.30(L)   Trlycerides 0.0 - 149.0  mg/dL - - - - 191.0(H)   PHART 7.350 - 7.450 7.381 7.346(L) - 7.428 -   PCO2ART 35.0 - 45.0 mmHg 39.8 42.1 - 33.2(L) -   HCO3 20.0 - 24.0 mEq/L 23.9 23.3 - 21.9 -   TCO2 0 - 100 mmol/L _0 -   ACIDBASEDEF 0.0 - 2.0 mmol/L 1.0 3.0(H) - 2.0 -   O2SAT - 91.0 91.0 - 90.0 -       Exercise Target Goals:    Exercise Program Goal: Individual exercise prescription set with THRR, safety & activity barriers. Participant demonstrates ability to understand and report RPE using BORG scale, to self-measure pulse accurately, and to acknowledge the importance of the exercise prescription.  Exercise Prescription Goal: Starting with aerobic activity 30 plus minutes a day, 3 days per week for initial exercise prescription. Provide home exercise prescription and guidelines that participant acknowledges understanding prior to discharge.  Activity Barriers & Risk  Stratification:     Activity Barriers & Risk Stratification - 05/19/15 1343    Activity Barriers & Risk Stratification   Activity Barriers Arthritis;Other (comment)   Comments Hips hurt when walk on an incline. Goes up to a 5/10. Stopping and resting helps.    Risk Stratification High      6 Minute Walk:     6 Minute Walk      05/19/15 1439       6 Minute Walk   Phase Initial     Distance 1450 feet     Walk Time 6 minutes     Resting HR 75 bpm     Resting BP 102/60 mmHg     Max Ex. HR 94 bpm     Max Ex. BP 128/66 mmHg     RPE 13     Symptoms No        Initial Exercise Prescription:     Initial Exercise Prescription - 05/19/15 1400    Date of Initial Exercise Prescription   Date 05/19/15   Treadmill   MPH 2   Grade 0   Minutes 10   Recumbant Bike   Level 2   RPM 40   Watts 20   Minutes 10   NuStep   Level 2   Watts 40   Minutes 10   Arm Ergometer   Level 1   Watts 10   Minutes 10   REL-XR   Level 2   Watts 50   Minutes 10   Prescription Details   Frequency (times per week) 3   Duration Progress to 30 minutes of continuous aerobic without signs/symptoms of physical distress   Intensity   THRR REST +  30   Ratings of Perceived Exertion 11-15   Perceived Dyspnea 2-4   Progression Continue progressive overload as per policy without signs/symptoms or physical distress.   Resistance Training   Training Prescription Yes   Weight 2   Reps 10-15      Exercise Prescription Changes:     Exercise Prescription Changes      05/25/15 1600 06/01/15 1600 06/13/15 0824 06/22/15 1600 06/30/15 1435   Exercise Review   Progression _1    Response to Exercise   Blood Pressure (Admit)   104/62 mmHg -- 106/58 mmHg   Blood Pressure (Exercise)   148/76 mmHg -- 138/66 mmHg   Blood Pressure (Exit)   111/62 mmHg -- 126/62 mmHg   Heart Rate (Admit)   85 bpm -- 87 bpm  Heart Rate (Exercise)   111 bpm -- 106 bpm   Heart Rate (Exit)   88 bpm -- 87  bpm   Rating of Perceived Exertion (Exercise)   12 -- 13   Symptoms  None Some SOB during exercise; 4 on dyspnea scale -- Some hip pain when walking on the TM   Comments  Reviewed individualized exercise prescription and made increases per departmental policy. Exercise increases were discussed with the patient and they were able to perform the new work loads without issue (no signs or symptoms).  Discussed interval training on the XR with Mallory Shirk can continuously exercise for the entire class and his exercise progression will now focus on intensity. We met with him and discussed interval training and he has been incorporating it into his exercise routine in order to progressively overload his system and allow for continual improvement. Due to some hip pain, the incline on the treadmill was removed and his speed kept the same. We will follow up with him on if this is better for him. He is continuing to exercise outside of class as much as he can. At home he usually walks.    Frequency    Add 1 additional day to program exercise sessions. --  Continue with 1d/wk home exercise   Duration  Progress to 30 minutes of continuous aerobic without signs/symptoms of physical distress Progress to 50 minutes of aerobic without signs/symptoms of physical distress Progress to 50 minutes of aerobic without signs/symptoms of physical distress Progress to 50 minutes of aerobic without signs/symptoms of physical distress   Intensity  Rest + 30 Rest + 30 Rest + 30 Rest + 30   Progression  Continue progressive overload as per policy without signs/symptoms or physical distress. Continue progressive overload as per policy without signs/symptoms or physical distress. Continue progressive overload as per policy without signs/symptoms or physical distress. Continue progressive overload as per policy without signs/symptoms or physical distress.   Resistance Training   Training Prescription  Yes Yes Yes Yes   Weight  _0 Reps  10-15 10-15 10-15 10-15   Interval Training   Interval Training  No Yes Yes Yes   Equipment   REL-XR REL-XR REL-XR   Treadmill   MPH 2._1 Grade 0 0 0 0 0   Minutes _2 Recumbant Bike   Level _3 RPM 60 60 60 60 60   Minutes  _4 REL-XR   Level   _5 Watts   70 70 70   Minutes   _6 Biostep-RELP   Level    3 4   Watts    30 30   Minutes    20 20      Discharge Exercise Prescription (Final Exercise Prescription Changes):     Exercise Prescription Changes - 06/30/15 1435    Exercise Review   Progression Yes   Response to Exercise   Blood Pressure (Admit) 106/58 mmHg   Blood Pressure (Exercise) 138/66 mmHg   Blood Pressure (Exit) 126/62 mmHg   Heart Rate (Admit) 87 bpm   Heart Rate (Exercise) 106 bpm   Heart Rate (Exit) 87 bpm   Rating of Perceived Exertion (Exercise) 13   Symptoms Some hip pain when walking on the TM   Comments Teren can continuously exercise for  the entire class and his exercise progression will now focus on intensity. We met with him and discussed interval training and he has been incorporating it into his exercise routine in order to progressively overload his system and allow for continual improvement. Due to some hip pain, the incline on the treadmill was removed and his speed kept the same. We will follow up with him on if this is better for him. He is continuing to exercise outside of class as much as he can. At home he usually walks.    Frequency --  Continue with 1d/wk home exercise   Duration Progress to 50 minutes of aerobic without signs/symptoms of physical distress   Intensity Rest + 30   Progression Continue progressive overload as per policy without signs/symptoms or physical distress.   Resistance Training   Training Prescription Yes   Weight 4   Reps 10-15   Interval Training   Interval Training Yes   Equipment REL-XR   Treadmill   MPH 3   Grade 0   Minutes 20   Recumbant  Bike   Level 3   RPM 60   Minutes 20   REL-XR   Level 4   Watts 70   Minutes 20   Biostep-RELP   Level 4   Watts 30   Minutes 20      Nutrition:  Target Goals: Understanding of nutrition guidelines, daily intake of sodium <1519m, cholesterol <2048m calories 30% from fat and 7% or less from saturated fats, daily to have 5 or more servings of fruits and vegetables.  Biometrics:    Nutrition Therapy Plan and Nutrition Goals:     Nutrition Therapy & Goals - 05/19/15 1520    Personal Nutrition Goals   Personal Goal #1 Cont to eat like he learned in the prediabetes class  FrParrisaid he would prefer not to have an individual appt with the Cardiac Rehab registered dietician since he has attended the prediabetes class before.    Intervention Plan   Intervention Using nutrition plan and personal goals to gain a healthy nutrition lifestyle. Add exercise as prescribed.      Nutrition Discharge: Rate Your Plate Scores:     Rate Your Plate - 1126/94/8584627  Rate Your Plate Scores   Pre Score 69   Pre Score % 77 %      Nutrition Goals Re-Evaluation:     Nutrition Goals Re-Evaluation      05/23/15 1805 06/02/15 1618 06/17/15 1412       Personal Goal #1 Re-Evaluation   Goal Progress Seen Yes No Yes     Comments  I asked FrRanellf he wanted to see the Cardiac Rehab dietician and he told me again he wanted to wait to check with his wife who needs to lose weight. He said his wife got mad at him today since he ate [employer] [place]         Psychosocial: Target Goals: Acknowledge presence or absence of depression, maximize coping skills, provide positive support system. Participant is able to verbalize types and ability to use techniques and skills needed for reducing stress and depression.  Initial Review & Psychosocial Screening:     Initial Psych Review & Screening - 05/19/15 1522    Family Dynamics   Good Support System? Yes   Barriers    Psychosocial barriers to participate in program There are no identifiable barriers or psychosocial needs.   Screening Interventions  Interventions Encouraged to exercise      Quality of Life Scores:     Quality of Life - 05/19/15 1522    Quality of Life Scores   Health/Function Pre 17.61 %   Socioeconomic Pre 25.58 %   Psych/Spiritual Pre 20.86 %   Family Pre 28.8 %   GLOBAL Pre 21.56 %      PHQ-9:     Recent Review Flowsheet Data    Depression screen [facility] 2/9 05/19/2015   Decreased Interest 1   Down, Depressed, Hopeless 1   PHQ - 2 Score 2   Altered sleeping 2   Tired, decreased energy 2   Change in appetite 0   Feeling bad or failure about yourself  0   Trouble concentrating 0   Moving slowly or fidgety/restless 0   Suicidal thoughts 0   PHQ-9 Score 6   Difficult doing work/chores Not difficult at all      Psychosocial Evaluation and Intervention:     Psychosocial Evaluation - 05/25/15 1717    Psychosocial Evaluation & Interventions   Interventions Stress management education;Relaxation education;Encouraged to exercise with the program and follow exercise prescription   Comments Counselor met with Mr. Degnan today for initial psychosocial evaluation.  He is a 70 year old gentleman who had a CABG in September resulting in his being in this program.  Mr. Laursen has a strong support system with a spouse of 36 years and 2 adult children who live closeby.  he also is actively involved in his local church community.  Mr. Omahoney reports he sleeps "okay" intermittently due to a urinary problem.  He has been taking Tylenol PM to help with this.  Counselor recommended possibly considering a natural OTC sleep aid that is sustained release instead to help him get back to sleep more easily.  He admits to a history of depression in the early 1990's for about (3) months when his job transferred him around a bit.  He denies current symptoms of depression or anxiety.  Mr. Ashland states he is  typically in a positive mood.  He has some stress in his life with his own health issues and reports his spouse is a 3X Cancer survivor, so he worries about her some as well.  Mr. Klausing goals for this program are to lose weight, return to a semi-normal life again with his energy and stamina back.  Counselor recommended he meet with the dietician to address his weight loss goals.        Psychosocial Re-Evaluation:     Psychosocial Re-Evaluation      05/23/15 1616 06/02/15 1622 06/17/15 1413       Psychosocial Re-Evaluation   Interventions Encouraged to attend Cardiac Rehabilitation for the exercise  Encouraged to attend Cardiac Rehabilitation for the exercise     Comments New to Cardiac rehab.  Wishes he is getting stronger but Raja reports he is limited since his Testerone levels are low.          Vocational Rehabilitation: Provide vocational rehab assistance to qualifying candidates.   Vocational Rehab Evaluation & Intervention:     Vocational Rehab - 05/19/15 1352    Initial Vocational Rehab Evaluation & Intervention   Assessment shows need for Vocational Rehabilitation No      Education: Education Goals: Education classes will be provided on a weekly basis, covering required topics. Participant will state understanding/return demonstration of topics presented.  Learning Barriers/Preferences:     Learning Barriers/Preferences - 05/19/15 1352  Learning Barriers/Preferences   Learning Barriers None   Learning Preferences None      Education Topics: General Nutrition Guidelines/Fats and Fiber: -Group instruction provided by verbal, written material, models and posters to present the general guidelines for heart healthy nutrition. Gives an explanation and review of dietary fats and fiber.          Cardiac Rehab from 06/29/2015 in [facility] Cardiac and Pulmonary Rehab   Date  06/20/15   Educator  PI   Instruction Review Code  2- meets goals/outcomes      Controlling  Sodium/Reading Food Labels: -Group verbal and written material supporting the discussion of sodium use in heart healthy nutrition. Review and explanation with models, verbal and written materials for utilization of the food label.      Cardiac Rehab from 06/29/2015 in [facility] Cardiac and Pulmonary Rehab   Date  06/27/15   Educator  PI   Instruction Review Code  2- meets goals/outcomes      Exercise Physiology & Risk Factors: - Group verbal and written instruction with models to review the exercise physiology of the cardiovascular system and associated critical values. Details cardiovascular disease risk factors and the goals associated with each risk factor.   Aerobic Exercise & Resistance Training: - Gives group verbal and written discussion on the health impact of inactivity. On the components of aerobic and resistive training programs and the benefits of this training and how to safely progress through these programs.      Cardiac Rehab from 06/29/2015 in [facility] Cardiac and Pulmonary Rehab   Date  05/23/15   Educator  [address]   Instruction Review Code  2- meets goals/outcomes      Flexibility, Balance, General Exercise Guidelines: - Provides group verbal and written instruction on the benefits of flexibility and balance training programs. Provides general exercise guidelines with specific guidelines to those with heart or lung disease. Demonstration and skill practice provided.      Cardiac Rehab from 06/29/2015 in [facility] Cardiac and Pulmonary Rehab   Date  05/30/15   Educator  sw   Instruction Review Code  2- meets goals/outcomes      Stress Management: - Provides group verbal and written instruction about the health risks of elevated stress, cause of high stress, and healthy ways to reduce stress.      Cardiac Rehab from 06/29/2015 in [facility] Cardiac and Pulmonary Rehab   Date  06/01/15   Educator  [facility]   Instruction Review Code  2- meets goals/outcomes      Depression: - Provides  group verbal and written instruction on the correlation between heart/lung disease and depressed mood, treatment options, and the stigmas associated with seeking treatment.      Cardiac Rehab from 06/29/2015 in [facility] Cardiac and Pulmonary Rehab   Date  06/29/15   Educator  [facility]   Instruction Review Code  2- meets goals/outcomes      Anatomy & Physiology of the Heart: - Group verbal and written instruction and models provide basic cardiac anatomy and physiology, with the coronary electrical and arterial systems. Review of: AMI, Angina, Valve disease, Heart Failure, Cardiac Arrhythmia, Pacemakers, and the ICD.      Cardiac Rehab from 06/29/2015 in [facility] Cardiac and Pulmonary Rehab   Date  06/06/15   Educator  [place]   Instruction Review Code  2- meets goals/outcomes      Cardiac Procedures: - Group verbal and written instruction and models to describe the testing methods done to  diagnose heart disease. Reviews the outcomes of the test results. Describes the treatment choices: Medical Management, Angioplasty, or Coronary Bypass Surgery.   Cardiac Medications: - Group verbal and written instruction to review commonly prescribed medications for heart disease. Reviews the medication, class of the drug, and side effects. Includes the steps to properly store meds and maintain the prescription regimen.   Go Sex-Intimacy & Heart Disease, Get SMART - Goal Setting: - Group verbal and written instruction through game format to discuss heart disease and the return to sexual intimacy. Provides group verbal and written material to discuss and apply goal setting through the application of the S.M.A.R.T. Method.   Other Matters of the Heart: - Provides group verbal, written materials and models to describe Heart Failure, Angina, Valve Disease, and Diabetes in the realm of heart disease. Includes description of the disease process and treatment options available to the cardiac patient.      Cardiac Rehab  from 06/29/2015 in [facility] Cardiac and Pulmonary Rehab   Date  06/15/15   Educator  DW   Instruction Review Code  2- meets goals/outcomes      Exercise & Equipment Safety: - Individual verbal instruction and demonstration of equipment use and safety with use of the equipment.      Cardiac Rehab from 06/29/2015 in [facility] Cardiac and Pulmonary Rehab   Date  05/19/15   Educator  C. EnterkinRN   Instruction Review Code  1- partially meets, needs review/practice      Infection Prevention: - Provides verbal and written material to individual with discussion of infection control including proper hand washing and proper equipment cleaning during exercise session.      Cardiac Rehab from 06/29/2015 in [facility] Cardiac and Pulmonary Rehab   Date  05/19/15   Educator  C. Enterkin,RN   Instruction Review Code  2- meets goals/outcomes      Falls Prevention: - Provides verbal and written material to individual with discussion of falls prevention and safety.      Cardiac Rehab from 06/29/2015 in [facility] Cardiac and Pulmonary Rehab   Date  05/19/15   Educator  C. Enterkin,RN   Instruction Review Code  2- meets goals/outcomes      Diabetes: - Individual verbal and written instruction to review signs/symptoms of diabetes, desired ranges of glucose level fasting, after meals and with exercise. Advice that pre and post exercise glucose checks will be done for 3 sessions at entry of program.      Cardiac Rehab from 06/29/2015 in [facility] Cardiac and Pulmonary Rehab   Date  05/19/15   Educator  C. Enterkin,RN   Instruction Review Code  1- partially meets, needs review/practice       Knowledge Questionnaire Score:     Knowledge Questionnaire Score - 05/19/15 1518    Knowledge Questionnaire Score   Pre Score 21      Personal Goals and Risk Factors at Admission:     Personal Goals and Risk Factors at Admission - 05/19/15 1522    Personal Goals and Risk Factors on Admission   Hypertension No    Lipids Yes   Goal Cholesterol controlled with medications as prescribed, with individualized exercise RX and with personalized nutrition plan. Value goals: LDL < 14m, HDL > 475m Participant states understanding of desired cholesterol values and following prescriptions.   Intervention Provide nutrition & aerobic exercise along with prescribed medications to achieve LDL <7087mHDL >66m25m    Personal Goals and Risk Factors Review:  Goals and Risk Factor Review      05/23/15 1615 05/23/15 1805 06/02/15 1620 06/17/15 1412 06/27/15 1728   Increase Aerobic Exercise and Physical Activity   Goals Progress/Improvement seen  Yes Yes Yes Yes No   Comments Exercise a full session today. Raif did well on the treadmill today. He liked the recumbent bike he exercised on today.  Matthan reports " I dont' feel like I am getting strong enough but my testerone levels are low. I was suppose to see the MD before my heart event , now I have to see a MD first before they will give me medicine to increase my testerone.   Viraat has a primary care MD appt this week for feeling tired. Also he did Tm 3.0/2% on treadmill for 5 minutes but got short of breath on 2% so took a break.    Diabetes   Goal --  Stable today Blood glucose control identified by blood glucose values, HgbA1C. Participant verbalizes understanding of the signs/symptoms of hyper/hypo glycemia, proper foot care and importance of medication and nutrition plan for blood glucose control.  Good blood sugars today.       Abnormal Lipids   Goal   --  Will follow up with his MD about his lipid level.      Progress seen towards goals    Unknown    Comments    Primary Care MD will follow up blood work for this.       07/04/15 1533 07/13/15 0838         Increase Aerobic Exercise and Physical Activity   Goals Progress/Improvement seen  Yes Yes      Comments Except today. Jovon called and said is having problems even getting into a car because he woke up  this am with his hip really hurting him. We adjusted Niquan's ex. rx. due to incline on the TM giving him hip pain. Despite this, he has still increased his walking speed and is getting stronger which is reflected in his workload on the BioStep. We can continuously exercise for the entire class and the focus is now on increaisng his intensity. We have found increasing his incline does not work well for him so we will focsu on speed and workload. Dayveon is active outside of class and walks at home. We will follow up with him next month on his structured home exercise plan and what has been working for him.          Personal Goals Discharge (Final Personal Goals and Risk Factors Review):      Goals and Risk Factor Review - 07/13/15 0838    Increase Aerobic Exercise and Physical Activity   Goals Progress/Improvement seen  Yes   Comments We adjusted Hazael's ex. rx. due to incline on the TM giving him hip pain. Despite this, he has still increased his walking speed and is getting stronger which is reflected in his workload on the [employer]. We can continuously exercise for the entire class and the focus is now on increaisng his intensity. We have found increasing his incline does not work well for him so we will focsu on speed and workload. Ezri is active outside of class and walks at home. We will follow up with him next month on his structured home exercise plan and what has been working for him.       ITP Comments:     ITP Comments      07/17/15 1349  ITP Comments Ready for 30 day review.  Continue with ITP          Comments:

## 2015-07-20 ENCOUNTER — Telehealth: Payer: Self-pay | Admitting: *Deleted

## 2015-07-20 ENCOUNTER — Encounter: Payer: Medicare Other | Attending: Cardiovascular Disease

## 2015-07-20 DIAGNOSIS — Z951 Presence of aortocoronary bypass graft: Secondary | ICD-10-CM | POA: Insufficient documentation

## 2015-07-20 NOTE — Telephone Encounter (Signed)
RN reached out to [name] via telephone to check on patient's status, as patient has not been to [facility] since June 30, 2015. Pt's hips were bothering him when he was here on December 15th.  Patient has completed 25/36 sessions.  Left message for patient to return the call.

## 2015-07-20 NOTE — Addendum Note (Signed)
Addended by: Rudy JewBICE, Wilba Mutz P on: 07/20/2015 11:34 AM   Modules accepted: Orders

## 2015-08-03 ENCOUNTER — Encounter: Payer: Self-pay | Admitting: *Deleted

## 2015-08-11 ENCOUNTER — Ambulatory Visit: Payer: Medicare Other | Admitting: Family Medicine

## 2015-08-12 ENCOUNTER — Telehealth: Payer: Self-pay | Admitting: *Deleted

## 2015-08-12 NOTE — Progress Notes (Signed)
Cardiac Individual Treatment Plan  Patient Details  Name: [name] MRN: [id number] Date of Birth: December 26, 1945 Referring Provider:  Thayer Headings, MD  Initial Encounter Date:    Visit Diagnosis: S/P CABG x 4 - Plan: CARDIAC REHAB 30 DAY REVIEW  Patient's Home Medications on Admission:  Current outpatient prescriptions:  .  acetaminophen (TYLENOL) 650 MG CR tablet, Take 650 mg by mouth every 8 (eight) hours as needed., Disp: , Rfl:  .  atorvastatin (LIPITOR) 40 MG tablet, Take 1 tablet (40 mg total) by mouth daily., Disp: 90 tablet, Rfl: 3 .  clopidogrel (PLAVIX) 75 MG tablet, Take 1 tablet (75 mg total) by mouth daily., Disp: 90 tablet, Rfl: 3 .  fexofenadine (ALLEGRA) 180 MG tablet, Take 180 mg by mouth daily., Disp: , Rfl:  .  finasteride (PROSCAR) 5 MG tablet, Take 1 tablet (5 mg total) by mouth daily., Disp: 90 tablet, Rfl: 3 .  gabapentin (NEURONTIN) 400 MG capsule, Take 1 capsule (400 mg total) by mouth 3 (three) times daily., Disp: 270 capsule, Rfl: 3 .  metoprolol tartrate (LOPRESSOR) 25 MG tablet, Take 0.5 tablets (12.5 mg total) by mouth 2 (two) times daily., Disp: 90 tablet, Rfl: 3 .  Multiple Vitamin (MULTIVITAMIN) tablet, Take 1 tablet by mouth daily., Disp: , Rfl:  .  NITROSTAT 0.4 MG SL tablet, Place 0.4 mg under the tongue every 5 (five) minutes x 3 doses as needed. (CHEST PAIN), Disp: , Rfl:  .  traMADol (ULTRAM) 50 MG tablet, Take 1-2 tablets (50-100 mg total) by mouth every 8 (eight) hours as needed., Disp: 60 tablet, Rfl: 0 .  traZODone (DESYREL) 50 MG tablet, Take 0.5-1 tablets (25-50 mg total) by mouth at bedtime as needed for sleep., Disp: 30 tablet, Rfl: 3  Past Medical History: Past Medical History  Diagnosis Date  . Peripheral vascular disease (HCC)     Bilateral ABIs 0.8  . Chronic distal aortic occlusion (HCC)     By report  . Diabetes mellitus     By his report, this is prediabetes   . SOB (shortness of breath)   . Hyperlipidemia LDL goal <70   .  Essential hypertension   . Weakness   . Erectile dysfunction   . Hypogonadism in male     Previously on testosterone injections, but has not yet restarted until further testing  . Enlarged prostate   . Arthritis   . Depression   . History of kidney stones   . Muscle pain   . CAD, multiple vessel 03/2105    LM 65%, mLAD 70%, 90% & 80%, o-pCx 75%, mCx 95% & d Cx 70%, OM1 60%, mRCA 90% with 100% dRCA. R-R collaterals  . S/P CABG x 5 04/04/2015    LIMA to LAD, SVG to DIAG, SVG to OM, seqSVG -PDA- dCx  . PAF (paroxysmal atrial fibrillation) ([place]) 03/2015    Noted per-cath & post-op CABG.    Tobacco Use: History  Smoking status  . Former Smoker -- 2.00 packs/day for 40 years  . Types: Cigarettes  . Quit date: 10/17/1992  Smokeless tobacco  . Never Used    Comment: quit 1994    Labs: Recent Review Flowsheet Data    Labs for ITP Cardiac and Pulmonary Rehab Latest Ref Rng 04/04/2015 04/04/2015 04/05/2015 04/06/2015 06/29/2015   Cholestrol 0 - 200 mg/dL - - - - 126   LDLCALC 0 - 99 mg/dL - - - - 53   HDL >39.00 mg/dL - - - -  34.30(L)   Trlycerides 0.0 - 149.0 mg/dL - - - - 191.0(H)   PHART 7.350 - 7.450 7.381 7.346(L) - 7.428 -   PCO2ART 35.0 - 45.0 mmHg 39.8 42.1 - 33.2(L) -   HCO3 20.0 - 24.0 mEq/L 23.9 23.3 - 21.9 -   TCO2 0 - 100 mmol/L _0 -   ACIDBASEDEF 0.0 - 2.0 mmol/L 1.0 3.0(H) - 2.0 -   O2SAT - 91.0 91.0 - 90.0 -       Exercise Target Goals:    Exercise Program Goal: Individual exercise prescription set with THRR, safety & activity barriers. Participant demonstrates ability to understand and report RPE using BORG scale, to self-measure pulse accurately, and to acknowledge the importance of the exercise prescription.  Exercise Prescription Goal: Starting with aerobic activity 30 plus minutes a day, 3 days per week for initial exercise prescription. Provide home exercise prescription and guidelines that participant acknowledges understanding prior to  discharge.  Activity Barriers & Risk Stratification:     Activity Barriers & Risk Stratification - 05/19/15 1343    Activity Barriers & Risk Stratification   Activity Barriers Arthritis;Other (comment)   Comments Hips hurt when walk on an incline. Goes up to a 5/10. Stopping and resting helps.    Risk Stratification High      6 Minute Walk:     6 Minute Walk      05/19/15 1439       6 Minute Walk   Phase Initial     Distance 1450 feet     Walk Time 6 minutes     Resting HR 75 bpm     Resting BP 102/60 mmHg     Max Ex. HR 94 bpm     Max Ex. BP 128/66 mmHg     RPE 13     Symptoms No        Initial Exercise Prescription:     Initial Exercise Prescription - 05/19/15 1400    Date of Initial Exercise Prescription   Date 05/19/15   Treadmill   MPH 2   Grade 0   Minutes 10   Recumbant Bike   Level 2   RPM 40   Watts 20   Minutes 10   NuStep   Level 2   Watts 40   Minutes 10   Arm Ergometer   Level 1   Watts 10   Minutes 10   REL-XR   Level 2   Watts 50   Minutes 10   Prescription Details   Frequency (times per week) 3   Duration Progress to 30 minutes of continuous aerobic without signs/symptoms of physical distress   Intensity   THRR REST +  30   Ratings of Perceived Exertion 11-15   Perceived Dyspnea 2-4   Progression Continue progressive overload as per policy without signs/symptoms or physical distress.   Resistance Training   Training Prescription Yes   Weight 2   Reps 10-15      Exercise Prescription Changes:     Exercise Prescription Changes      05/25/15 1600 06/01/15 1600 06/13/15 0824 06/22/15 1600 06/30/15 1435   Exercise Review   Progression _1    Response to Exercise   Blood Pressure (Admit)   104/62 mmHg -- 106/58 mmHg   Blood Pressure (Exercise)   148/76 mmHg -- 138/66 mmHg   Blood Pressure (Exit)   111/62 mmHg -- 126/62 mmHg   Heart Rate (Admit)  85 bpm -- 87 bpm   Heart Rate (Exercise)   111 bpm -- 106  bpm   Heart Rate (Exit)   88 bpm -- 87 bpm   Rating of Perceived Exertion (Exercise)   12 -- 13   Symptoms  None Some SOB during exercise; 4 on dyspnea scale -- Some hip pain when walking on the TM   Comments  Reviewed individualized exercise prescription and made increases per departmental policy. Exercise increases were discussed with the patient and they were able to perform the new work loads without issue (no signs or symptoms).  Discussed interval training on the XR with Mallory Shirk can continuously exercise for the entire class and his exercise progression will now focus on intensity. We met with him and discussed interval training and he has been incorporating it into his exercise routine in order to progressively overload his system and allow for continual improvement. Due to some hip pain, the incline on the treadmill was removed and his speed kept the same. We will follow up with him on if this is better for him. He is continuing to exercise outside of class as much as he can. At home he usually walks.    Frequency    Add 1 additional day to program exercise sessions. --  Continue with 1d/wk home exercise   Duration  Progress to 30 minutes of continuous aerobic without signs/symptoms of physical distress Progress to 50 minutes of aerobic without signs/symptoms of physical distress Progress to 50 minutes of aerobic without signs/symptoms of physical distress Progress to 50 minutes of aerobic without signs/symptoms of physical distress   Intensity  Rest + 30 Rest + 30 Rest + 30 Rest + 30   Progression  Continue progressive overload as per policy without signs/symptoms or physical distress. Continue progressive overload as per policy without signs/symptoms or physical distress. Continue progressive overload as per policy without signs/symptoms or physical distress. Continue progressive overload as per policy without signs/symptoms or physical distress.   Resistance Training   Training  Prescription  Yes Yes Yes Yes   Weight  _0 Reps  10-15 10-15 10-15 10-15   Interval Training   Interval Training  No Yes Yes Yes   Equipment   REL-XR REL-XR REL-XR   Treadmill   MPH 2._1 Grade 0 0 0 0 0   Minutes _2 Recumbant Bike   Level _3 RPM 60 60 60 60 60   Minutes  _4 REL-XR   Level   _5 Watts   70 70 70   Minutes   _6 Biostep-RELP   Level    3 4   Watts    30 30   Minutes    20 20     08/01/15 1300           Exercise Review   Progression No  Absent since 06/30/15       Response to Exercise   Symptoms None       Comments Keighan has been absent since 06/29/16. His Ex. Rx. will be re-evaluated upon his return based on his fitness level.        Frequency --  Continue with 1d/wk home exercise       Duration Progress to 50 minutes of aerobic without signs/symptoms of physical  distress       Intensity Rest + 30       Progression Continue progressive overload as per policy without signs/symptoms or physical distress.       Resistance Training   Training Prescription Yes       Weight 4       Reps 10-15       Interval Training   Interval Training Yes       Equipment REL-XR       Treadmill   MPH 3       Grade 0       Minutes 20       Recumbant Bike   Level 3       RPM 60       Minutes 20       REL-XR   Level 4       Watts 70       Minutes 20       Biostep-RELP   Level 4       Watts 30       Minutes 20          Discharge Exercise Prescription (Final Exercise Prescription Changes):     Exercise Prescription Changes - 08/01/15 1300    Exercise Review   Progression No  Absent since 06/30/15   Response to Exercise   Symptoms None   Comments Jotham has been absent since 06/29/16. His Ex. Rx. will be re-evaluated upon his return based on his fitness level.    Frequency --  Continue with 1d/wk home exercise   Duration Progress to 50 minutes of aerobic without signs/symptoms of physical  distress   Intensity Rest + 30   Progression Continue progressive overload as per policy without signs/symptoms or physical distress.   Resistance Training   Training Prescription Yes   Weight 4   Reps 10-15   Interval Training   Interval Training Yes   Equipment REL-XR   Treadmill   MPH 3   Grade 0   Minutes 20   Recumbant Bike   Level 3   RPM 60   Minutes 20   REL-XR   Level 4   Watts 70   Minutes 20   Biostep-RELP   Level 4   Watts 30   Minutes 20      Nutrition:  Target Goals: Understanding of nutrition guidelines, daily intake of sodium <1552m, cholesterol <2097m calories 30% from fat and 7% or less from saturated fats, daily to have 5 or more servings of fruits and vegetables.  Biometrics:    Nutrition Therapy Plan and Nutrition Goals:     Nutrition Therapy & Goals - 05/19/15 1520    Personal Nutrition Goals   Personal Goal #1 Cont to eat like he learned in the prediabetes class  FrTarekaid he would prefer not to have an individual appt with the Cardiac Rehab registered dietician since he has attended the prediabetes class before.    Intervention Plan   Intervention Using nutrition plan and personal goals to gain a healthy nutrition lifestyle. Add exercise as prescribed.      Nutrition Discharge: Rate Your Plate Scores:     Rate Your Plate - 1189/16/9485038  Rate Your Plate Scores   Pre Score 69   Pre Score % 77 %      Nutrition Goals Re-Evaluation:     Nutrition Goals Re-Evaluation      05/23/15 1805 06/02/15 1618 06/17/15 1412  Personal Goal #1 Re-Evaluation   Goal Progress Seen Yes No Yes     Comments  I asked Mehtab if he wanted to see the Cardiac Rehab dietician and he told me again he wanted to wait to check with his wife who needs to lose weight. He said his wife got mad at him today since he ate [employer] [place] Lucendia Herrlich and a [place].          Psychosocial: Target Goals: Acknowledge presence or absence of depression,  maximize coping skills, provide positive support system. Participant is able to verbalize types and ability to use techniques and skills needed for reducing stress and depression.  Initial Review & Psychosocial Screening:     Initial Psych Review & Screening - 05/19/15 1522    Family Dynamics   Good Support System? Yes   Barriers   Psychosocial barriers to participate in program There are no identifiable barriers or psychosocial needs.   Screening Interventions   Interventions Encouraged to exercise      Quality of Life Scores:     Quality of Life - 05/19/15 1522    Quality of Life Scores   Health/Function Pre 17.61 %   Socioeconomic Pre 25.58 %   Psych/Spiritual Pre 20.86 %   Family Pre 28.8 %   GLOBAL Pre 21.56 %      PHQ-9:     Recent Review Flowsheet Data    Depression screen [facility] 2/9 05/19/2015   Decreased Interest 1   Down, Depressed, Hopeless 1   PHQ - 2 Score 2   Altered sleeping 2   Tired, decreased energy 2   Change in appetite 0   Feeling bad or failure about yourself  0   Trouble concentrating 0   Moving slowly or fidgety/restless 0   Suicidal thoughts 0   PHQ-9 Score 6   Difficult doing work/chores Not difficult at all      Psychosocial Evaluation and Intervention:     Psychosocial Evaluation - 05/25/15 1717    Psychosocial Evaluation & Interventions   Interventions Stress management education;Relaxation education;Encouraged to exercise with the program and follow exercise prescription   Comments Counselor met with Mr. Cuaresma today for initial psychosocial evaluation.  He is a 70 year old gentleman who had a CABG in September resulting in his being in this program.  Mr. Raborn has a strong support system with a spouse of 7 years and 2 adult children who live closeby.  he also is actively involved in his local church community.  Mr. Lindahl reports he sleeps "okay" intermittently due to a urinary problem.  He has been taking Tylenol PM to help with this.   Counselor recommended possibly considering a natural OTC sleep aid that is sustained release instead to help him get back to sleep more easily.  He admits to a history of depression in the early 1990's for about (3) months when his job transferred him around a bit.  He denies current symptoms of depression or anxiety.  Mr. Bohnenkamp states he is typically in a positive mood.  He has some stress in his life with his own health issues and reports his spouse is a 3X Cancer survivor, so he worries about her some as well.  Mr. Kluever goals for this program are to lose weight, return to a semi-normal life again with his energy and stamina back.  Counselor recommended he meet with the dietician to address his weight loss goals.        Psychosocial Re-Evaluation:  Psychosocial Re-Evaluation      05/23/15 1616 06/02/15 1622 06/17/15 1413       Psychosocial Re-Evaluation   Interventions Encouraged to attend Cardiac Rehabilitation for the exercise  Encouraged to attend Cardiac Rehabilitation for the exercise     Comments New to Cardiac rehab.  Wishes he is getting stronger but Donn reports he is limited since his Testerone levels are low.          Vocational Rehabilitation: Provide vocational rehab assistance to qualifying candidates.   Vocational Rehab Evaluation & Intervention:     Vocational Rehab - 05/19/15 1352    Initial Vocational Rehab Evaluation & Intervention   Assessment shows need for Vocational Rehabilitation No      Education: Education Goals: Education classes will be provided on a weekly basis, covering required topics. Participant will state understanding/return demonstration of topics presented.  Learning Barriers/Preferences:     Learning Barriers/Preferences - 05/19/15 1352    Learning Barriers/Preferences   Learning Barriers None   Learning Preferences None      Education Topics: General Nutrition Guidelines/Fats and Fiber: -Group instruction provided by verbal,  written material, models and posters to present the general guidelines for heart healthy nutrition. Gives an explanation and review of dietary fats and fiber.          Cardiac Rehab from 06/29/2015 in [facility] Cardiac and Pulmonary Rehab   Date  06/20/15   Educator  PI   Instruction Review Code  2- meets goals/outcomes      Controlling Sodium/Reading Food Labels: -Group verbal and written material supporting the discussion of sodium use in heart healthy nutrition. Review and explanation with models, verbal and written materials for utilization of the food label.      Cardiac Rehab from 06/29/2015 in [facility] Cardiac and Pulmonary Rehab   Date  06/27/15   Educator  PI   Instruction Review Code  2- meets goals/outcomes      Exercise Physiology & Risk Factors: - Group verbal and written instruction with models to review the exercise physiology of the cardiovascular system and associated critical values. Details cardiovascular disease risk factors and the goals associated with each risk factor.   Aerobic Exercise & Resistance Training: - Gives group verbal and written discussion on the health impact of inactivity. On the components of aerobic and resistive training programs and the benefits of this training and how to safely progress through these programs.      Cardiac Rehab from 06/29/2015 in [facility] Cardiac and Pulmonary Rehab   Date  05/23/15   Educator  [address]   Instruction Review Code  2- meets goals/outcomes      Flexibility, Balance, General Exercise Guidelines: - Provides group verbal and written instruction on the benefits of flexibility and balance training programs. Provides general exercise guidelines with specific guidelines to those with heart or lung disease. Demonstration and skill practice provided.      Cardiac Rehab from 06/29/2015 in [facility] Cardiac and Pulmonary Rehab   Date  05/30/15   Educator  sw   Instruction Review Code  2- meets goals/outcomes      Stress  Management: - Provides group verbal and written instruction about the health risks of elevated stress, cause of high stress, and healthy ways to reduce stress.      Cardiac Rehab from 06/29/2015 in [facility] Cardiac and Pulmonary Rehab   Date  06/01/15   Educator  [facility]   Instruction Review Code  2- meets goals/outcomes  Depression: - Provides group verbal and written instruction on the correlation between heart/lung disease and depressed mood, treatment options, and the stigmas associated with seeking treatment.      Cardiac Rehab from 06/29/2015 in [facility] Cardiac and Pulmonary Rehab   Date  06/29/15   Educator  [facility]   Instruction Review Code  2- meets goals/outcomes      Anatomy & Physiology of the Heart: - Group verbal and written instruction and models provide basic cardiac anatomy and physiology, with the coronary electrical and arterial systems. Review of: AMI, Angina, Valve disease, Heart Failure, Cardiac Arrhythmia, Pacemakers, and the ICD.      Cardiac Rehab from 06/29/2015 in [facility] Cardiac and Pulmonary Rehab   Date  06/06/15   Educator  [place]   Instruction Review Code  2- meets goals/outcomes      Cardiac Procedures: - Group verbal and written instruction and models to describe the testing methods done to diagnose heart disease. Reviews the outcomes of the test results. Describes the treatment choices: Medical Management, Angioplasty, or Coronary Bypass Surgery.   Cardiac Medications: - Group verbal and written instruction to review commonly prescribed medications for heart disease. Reviews the medication, class of the drug, and side effects. Includes the steps to properly store meds and maintain the prescription regimen.   Go Sex-Intimacy & Heart Disease, Get SMART - Goal Setting: - Group verbal and written instruction through game format to discuss heart disease and the return to sexual intimacy. Provides group verbal and written material to discuss and apply goal setting  through the application of the S.M.A.R.T. Method.   Other Matters of the Heart: - Provides group verbal, written materials and models to describe Heart Failure, Angina, Valve Disease, and Diabetes in the realm of heart disease. Includes description of the disease process and treatment options available to the cardiac patient.      Cardiac Rehab from 06/29/2015 in [facility] Cardiac and Pulmonary Rehab   Date  06/15/15   Educator  DW   Instruction Review Code  2- meets goals/outcomes      Exercise & Equipment Safety: - Individual verbal instruction and demonstration of equipment use and safety with use of the equipment.      Cardiac Rehab from 06/29/2015 in [facility] Cardiac and Pulmonary Rehab   Date  05/19/15   Educator  C. EnterkinRN   Instruction Review Code  1- partially meets, needs review/practice      Infection Prevention: - Provides verbal and written material to individual with discussion of infection control including proper hand washing and proper equipment cleaning during exercise session.      Cardiac Rehab from 06/29/2015 in [facility] Cardiac and Pulmonary Rehab   Date  05/19/15   Educator  C. Enterkin,RN   Instruction Review Code  2- meets goals/outcomes      Falls Prevention: - Provides verbal and written material to individual with discussion of falls prevention and safety.      Cardiac Rehab from 06/29/2015 in [facility] Cardiac and Pulmonary Rehab   Date  05/19/15   Educator  C. Enterkin,RN   Instruction Review Code  2- meets goals/outcomes      Diabetes: - Individual verbal and written instruction to review signs/symptoms of diabetes, desired ranges of glucose level fasting, after meals and with exercise. Advice that pre and post exercise glucose checks will be done for 3 sessions at entry of program.      Cardiac Rehab from 06/29/2015 in [facility] Cardiac and Pulmonary Rehab  Date  05/19/15   Educator  C. Enterkin,RN   Instruction Review Code  1- partially meets, needs  review/practice       Knowledge Questionnaire Score:     Knowledge Questionnaire Score - 05/19/15 1518    Knowledge Questionnaire Score   Pre Score 21      Personal Goals and Risk Factors at Admission:     Personal Goals and Risk Factors at Admission - 05/19/15 1522    Personal Goals and Risk Factors on Admission   Hypertension No   Lipids Yes   Goal Cholesterol controlled with medications as prescribed, with individualized exercise RX and with personalized nutrition plan. Value goals: LDL < 85m, HDL > 438m Participant states understanding of desired cholesterol values and following prescriptions.   Intervention Provide nutrition & aerobic exercise along with prescribed medications to achieve LDL <7071mHDL >55m70m    Personal Goals and Risk Factors Review:      Goals and Risk Factor Review      05/23/15 1615 05/23/15 1805 06/02/15 1620 06/17/15 1412 06/27/15 1728   Increase Aerobic Exercise and Physical Activity   Goals Progress/Improvement seen  Yes Yes Yes Yes No   Comments Exercise a full session today. FranDanzell well on the treadmill today. He liked the recumbent bike he exercised on today.  FranDequonorts " I dont' feel like I am getting strong enough but my testerone levels are low. I was suppose to see the MD before my heart event , now I have to see a MD first before they will give me medicine to increase my testerone.   FranBarrington a primary care MD appt this week for feeling tired. Also he did Tm 3.0/2% on treadmill for 5 minutes but got short of breath on 2% so took a break.    Diabetes   Goal --  Stable today Blood glucose control identified by blood glucose values, HgbA1C. Participant verbalizes understanding of the signs/symptoms of hyper/hypo glycemia, proper foot care and importance of medication and nutrition plan for blood glucose control.  Good blood sugars today.       Abnormal Lipids   Goal   --  Will follow up with his MD about his lipid level.       Progress seen towards goals    Unknown    Comments    Primary Care MD will follow up blood work for this.       07/04/15 1533 07/13/15 0838 08/01/15 1304       Increase Aerobic Exercise and Physical Activity   Goals Progress/Improvement seen  Yes Yes No     Comments Except today. FranNicoleled and said is having problems even getting into a car because he woke up this am with his hip really hurting him. We adjusted Madeline's ex. rx. due to incline on the TM giving him hip pain. Despite this, he has still increased his walking speed and is getting stronger which is reflected in his workload on the BioStep. We can continuously exercise for the entire class and the focus is now on increaisng his intensity. We have found increasing his incline does not work well for him so we will focsu on speed and workload. FranAmmonactive outside of class and walks at home. We will follow up with him next month on his structured home exercise plan and what has been working for him.  FranKolt been absent since 06/29/16. His Ex. Rx. will be re-evaluated upon his  return based on his fitness level.         Personal Goals Discharge (Final Personal Goals and Risk Factors Review):      Goals and Risk Factor Review - 08/01/15 1304    Increase Aerobic Exercise and Physical Activity   Goals Progress/Improvement seen  No   Comments Nakota has been absent since 06/29/16. His Ex. Rx. will be re-evaluated upon his return based on his fitness level.       ITP Comments:     ITP Comments      07/17/15 1349 08/12/15 1201         ITP Comments Ready for 30 day review.  Continue with ITP Ready for discharge stopped at end of year because of insurance.         Comments:

## 2015-08-12 NOTE — Addendum Note (Signed)
Addended by: Rudy Jew on: 08/12/2015 12:04 PM   Modules accepted: Orders

## 2015-08-12 NOTE — Telephone Encounter (Signed)
Called to check on status to return to program. Ely needs to discharge.  He plans to return to the [employer].

## 2015-08-19 ENCOUNTER — Encounter: Payer: Self-pay | Admitting: Internal Medicine

## 2015-08-19 ENCOUNTER — Ambulatory Visit (INDEPENDENT_AMBULATORY_CARE_PROVIDER_SITE_OTHER): Payer: Medicare Other | Admitting: Internal Medicine

## 2015-08-19 VITALS — BP 114/65 | HR 80 | Temp 98.1°F | Ht 70.0 in | Wt 210.4 lb

## 2015-08-19 DIAGNOSIS — R490 Dysphonia: Secondary | ICD-10-CM | POA: Diagnosis not present

## 2015-08-19 DIAGNOSIS — I25119 Atherosclerotic heart disease of native coronary artery with unspecified angina pectoris: Secondary | ICD-10-CM

## 2015-08-19 DIAGNOSIS — M25552 Pain in left hip: Secondary | ICD-10-CM | POA: Diagnosis not present

## 2015-08-19 MED ORDER — TRAMADOL HCL 50 MG PO TABS: 50.0000 mg | ORAL_TABLET | Freq: Three times a day (TID) | ORAL | Status: DC | PRN

## 2015-08-19 NOTE — Progress Notes (Signed)
Subjective:    Patient ID: [name], male    DOB: 1945/07/24, 70 y.o.   MRN: [id number]  HPI  70YO male presents for follow up.  Left hip pain improved. However having some pain earlier this week in right hip. Now improving. Has follow up with Dr. Katrinka Blazing next week.  Feeling more energetic. Plans to start back at gym Some chest pain with sneezing or cough. No chest or arm pain with exertion.  Having some hoarseness ever since CABG. No sore throat or dyspnea. No trouble swallowing. Not doing anything for this. Does not want to see ENT at this point.   Wt Readings from Last 3 Encounters:  08/19/15 210 lb 6 oz (95.425 kg)  07/15/15 208 lb (94.348 kg)  07/06/15 208 lb (94.348 kg)   BP Readings from Last 3 Encounters:  08/19/15 114/65  07/15/15 112/62  07/06/15 92/54    Past Medical History  Diagnosis Date  . Peripheral vascular disease (HCC)     Bilateral ABIs 0.8  . Chronic distal aortic occlusion (HCC)     By report  . Diabetes mellitus     By his report, this is prediabetes   . SOB (shortness of breath)   . Hyperlipidemia LDL goal <70   . Essential hypertension   . Weakness   . Erectile dysfunction   . Hypogonadism in male     Previously on testosterone injections, but has not yet restarted until further testing  . Enlarged prostate   . Arthritis   . Depression   . History of kidney stones   . Muscle pain   . CAD, multiple vessel 03/2105    LM 65%, mLAD 70%, 90% & 80%, o-pCx 75%, mCx 95% & d Cx 70%, OM1 60%, mRCA 90% with 100% dRCA. R-R collaterals  . S/P CABG x 5 04/04/2015    LIMA to LAD, SVG to DIAG, SVG to OM, seqSVG -PDA- dCx  . PAF (paroxysmal atrial fibrillation) (HCC) 03/2015    Noted per-cath & post-op CABG.   Family History  Problem Relation Age of Onset  . Coronary artery disease Father   . Heart disease Father   . Stroke Mother   . Stroke Sister   . Prostate cancer Neg Hx   . Kidney disease Neg Hx    Past Surgical History  Procedure  Laterality Date  . Cardiac catheterization  1992  . Ventral hernia repair  1994    complicated by infection  . Cholecystectomy  2000    also had revision of hernia repair  . Cystectomy      face and scrotum  . Blepharoplasty    . Lexiscan myoview  June 2010    converted to [facility] from treadmill due to inability to reach target heart rate) --the study was normal with no evidence of ischemia and normal LV function. EF 7  . Cardiac catheterization N/A 03/31/2015    Procedure: Left Heart Cath and Coronary Angiography;  Surgeon: Marykay Lex, MD;  Location: [facility] INVASIVE CV LAB;  Service: Cardiovascular;  Laterality: N/A;  . Coronary artery bypass graft N/A 04/04/2015    Procedure: CORONARY ARTERY BYPASS GRAFTING (CABG);  Surgeon: [name], MD;  Location: [facility] OR;  Service: Open Heart Surgery;  Laterality: N/A;  . Tee without cardioversion N/A 04/04/2015    Procedure: TRANSESOPHAGEAL ECHOCARDIOGRAM (TEE);  Surgeon: [name], MD;  Location: [facility] OR;  Service: Open Heart Surgery;  Laterality: N/A;   Social History   Social  History  . Marital Status: Married    Spouse Name: N/A  . Number of Children: N/A  . Years of Education: N/A   Social History Main Topics  . Smoking status: Former Smoker -- 2.00 packs/day for 40 years    Types: Cigarettes    Quit date: 10/17/1992  . Smokeless tobacco: Never Used     Comment: quit 1994  . Alcohol Use: 0.0 oz/week    0 Standard drinks or equivalent per week     Comment: seldom  . Drug Use: No  . Sexual Activity: Not Asked   Other Topics Concern  . None   Social History Narrative   Lives in [place] with wife. No pets   Two children.      Former smoker. Quit in 1994. Prior to quitting was unable to walk without exertional dyspnea.      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      Hobbies - reading, drives Corvette, chuch    Review of Systems    Constitutional: Negative for fever, chills, activity change, appetite change, fatigue and unexpected weight change.  Eyes: Negative for visual disturbance.  Respiratory: Negative for cough and shortness of breath.   Cardiovascular: Positive for chest pain (chest wall pain). Negative for palpitations and leg swelling.  Gastrointestinal: Negative for abdominal pain and abdominal distention.  Genitourinary: Negative for dysuria, urgency and difficulty urinating.  Musculoskeletal: Positive for myalgias and arthralgias. Negative for gait problem.  Skin: Negative for color change and rash.  Hematological: Negative for adenopathy.  Psychiatric/Behavioral: Negative for sleep disturbance and dysphoric mood. The patient is not nervous/anxious.        Objective:    BP 114/65 mmHg  Pulse 80  Temp(Src) 98.1 F (36.7 C) (Oral)  Ht  (1.778 m)  Wt 210 lb 6 oz (95.425 kg)  BMI 30.19 kg/m2  SpO2 95% Physical Exam  Constitutional: He is oriented to person, place, and time. He appears well-developed and well-nourished. No distress.  HENT:  Head: Normocephalic and atraumatic.  Right Ear: External ear normal.  Left Ear: External ear normal.  Nose: Nose normal.  Mouth/Throat: Oropharynx is clear and moist. No oropharyngeal exudate.  Eyes: Conjunctivae and EOM are normal. Pupils are equal, round, and reactive to light. Right eye exhibits no discharge. Left eye exhibits no discharge. No scleral icterus.  Neck: Normal range of motion. Neck supple. No tracheal deviation present. No thyromegaly present.  Cardiovascular: Normal rate, regular rhythm and normal heart sounds.  Exam reveals no gallop and no friction rub.   No murmur heard. Pulmonary/Chest: Effort normal and breath sounds normal. No accessory muscle usage. No tachypnea. No respiratory distress. He has no decreased breath sounds. He has no wheezes. He has no rhonchi. He has no rales. He exhibits no tenderness.    Musculoskeletal: Normal  range of motion. He exhibits no edema.  Lymphadenopathy:    He has no cervical adenopathy.  Neurological: He is alert and oriented to person, place, and time. No cranial nerve deficit. Coordination normal.  Skin: Skin is warm and dry. No rash noted. He is not diaphoretic. No erythema. No pallor.  Psychiatric: He has a normal mood and affect. His behavior is normal. Judgment and thought content normal.          Assessment & Plan:   Problem List Items Addressed This Visit      Unprioritized   Coronary artery disease  involving native coronary artery with angina pectoris (HCC) (Chronic)    No recent anginal pain. Encouraged continued exercise. Continue current medications. Follow up with cardiology as scheduled.      Hoarseness    Hoarseness after CABG. Discussed potential causes and possible ENT evaluation of vocal cords. He prefers to wait for now.      Left hip pain - Primary    Symptoms have resolved. Encouraged exercise. Follow up with Dr. Katrinka Blazing prn. Continue Tramadol prn if recurrent pain.          Return in about 3 months (around 11/16/2015).

## 2015-08-19 NOTE — Progress Notes (Signed)
Pre visit review using our clinic review tool, if applicable. No additional management support is needed unless otherwise documented below in the visit note. 

## 2015-08-19 NOTE — Assessment & Plan Note (Signed)
Symptoms have resolved. Encouraged exercise. Follow up with Dr. Katrinka Blazing prn. Continue Tramadol prn if recurrent pain.

## 2015-08-19 NOTE — Assessment & Plan Note (Signed)
Hoarseness after CABG. Discussed potential causes and possible ENT evaluation of vocal cords. He prefers to wait for now.

## 2015-08-19 NOTE — Assessment & Plan Note (Signed)
No recent anginal pain. Encouraged continued exercise. Continue current medications. Follow up with cardiology as scheduled.

## 2015-08-25 ENCOUNTER — Ambulatory Visit: Payer: Medicare Other | Admitting: Family Medicine

## 2015-10-19 ENCOUNTER — Ambulatory Visit (INDEPENDENT_AMBULATORY_CARE_PROVIDER_SITE_OTHER): Payer: Medicare Other | Admitting: Cardiology

## 2015-10-19 ENCOUNTER — Encounter: Payer: Self-pay | Admitting: Cardiology

## 2015-10-19 VITALS — BP 120/60 | HR 70 | Ht 69.0 in | Wt 209.8 lb

## 2015-10-19 DIAGNOSIS — I25119 Atherosclerotic heart disease of native coronary artery with unspecified angina pectoris: Secondary | ICD-10-CM | POA: Diagnosis not present

## 2015-10-19 DIAGNOSIS — R0789 Other chest pain: Secondary | ICD-10-CM

## 2015-10-19 DIAGNOSIS — R0602 Shortness of breath: Secondary | ICD-10-CM | POA: Diagnosis not present

## 2015-10-19 DIAGNOSIS — E785 Hyperlipidemia, unspecified: Secondary | ICD-10-CM

## 2015-10-19 DIAGNOSIS — I480 Paroxysmal atrial fibrillation: Secondary | ICD-10-CM | POA: Diagnosis not present

## 2015-10-19 DIAGNOSIS — I10 Essential (primary) hypertension: Secondary | ICD-10-CM

## 2015-10-19 DIAGNOSIS — I2510 Atherosclerotic heart disease of native coronary artery without angina pectoris: Secondary | ICD-10-CM

## 2015-10-19 NOTE — Patient Instructions (Signed)
Medication Instructions:  Your physician recommends that you continue on your current medications as directed. Please refer to the Current Medication list given to you today.   Labwork: none  Testing/Procedures: none  Follow-Up: Your physician wants you to follow-up in: six months with Dr. Harding.  You will receive a reminder letter in the mail two months in advance. If you don't receive a letter, please call our office to schedule the follow-up appointment.   Any Other Special Instructions Will Be Listed Below (If Applicable).     If you need a refill on your cardiac medications before your next appointment, please call your pharmacy.   

## 2015-10-19 NOTE — Progress Notes (Signed)
PCP: [name], MD  Clinic Note: Chief Complaint  Patient presents with  . Follow-up    4 month follow up. Meds reviewed by the patient verbally. Pt. c/o shortness of breath with & without exertion.   . Coronary Artery Disease    History of CABG    HPI: [name] is a 70 y.o. male with a PMH below who presents today for 4 months f/u of CAD-CABG As you recall patient formally followed by Dr Elease Hashimoto, who has known Leriche syndrome with occluded abdominal aorta and significant collateralization is lower extremities. I saw him last summer with symptoms are very concerning for angina. We proceeded with diagnostic cardiac catheterization revealing severe multivessel disease with heavy calcification of the proximal left and right coronary arteries with left main disease as well as proximal/mid LAD stenosis as well as circumflex and RCA severe lesions. He was then referred for CABG. Also of note during his diagnostic catheterization actually developed A. fib with RVR, therefore he was actually hospitalized for his CABG  04/04/2015: CORONARY ARTERY BYPASS GRAFTING X 5 (LIMA to LAD, SVG to DIAG, SVG to OM, seqSVG -PDA- dCx)  [name] was last seen o12/616 => was recovering from CABG - just discharged.  No Afib.  MSK pains.  No angina. No more lasix.  Recent Hospitalizations: none since last visit  Studies Reviewed: none Doing [facility] - latest report - doing well.  Interval History: [name] presents today for a short followup to reassess while off of Amiodarone.  He still has the strange chest wall sensations still - numb R breast.  Still getting slowly better with walking.  No rapid HRs.He is doing maintenance rehabilitation, and doing well with it. He just states that occasionally he will have episodes where he'll get profoundly dyspneic just walking 50 feet. Other days he is fine. Been off and on for a couple months now. He can do fine walking on the treadmill and doesn't notice any  issues. Then all of a sudden he'll have the spell. Is not associated with any coughing or wheezing. He denies a PND orthopnea or worsening edema.  No anginal chest pain with rest or exertion.  Occasional "flip flop" sensations (? When blows nose", but no rapid palpitations or heart rate.. No palpitations, lightheadedness, dizziness, weakness or syncope/near syncope. No TIA/amaurosis fugax symptoms. To the extent that he has been walking, he denies any claudication.  ROS: A comprehensive was performed. Review of Systems  Constitutional: Negative for diaphoresis.  HENT:       Has a poor taste in his mouth to  Respiratory: Positive for cough (off & on). Negative for shortness of breath and wheezing.   Cardiovascular: Negative for leg swelling.       Per history of present illness  Gastrointestinal: Negative for blood in stool and melena.  Genitourinary: Negative for hematuria.  Musculoskeletal: Negative for falls.       Mild chest wall discomfort - off & on with movement   Neurological: Positive for dizziness (less prominent) and headaches.  Endo/Heme/Allergies: Does not bruise/bleed easily.  Psychiatric/Behavioral: The patient has insomnia (improved).   All other systems reviewed and are negative.    Past Medical History  Diagnosis Date  . Peripheral vascular disease (HCC)     Bilateral ABIs 0.8  . Chronic distal aortic occlusion (HCC)     By report  . Diabetes mellitus     By his report, this is prediabetes   . SOB (shortness of breath)   .  Hyperlipidemia LDL goal <70   . Essential hypertension   . Weakness   . Erectile dysfunction   . Hypogonadism in male     Previously on testosterone injections, but has not yet restarted until further testing  . Enlarged prostate   . Arthritis   . Depression   . History of kidney stones   . Muscle pain   . CAD, multiple vessel 03/2105    LM 65%, mLAD 70%, 90% & 80%, o-pCx 75%, mCx 95% & d Cx 70%, OM1 60%, mRCA 90% with 100% dRCA. R-R  collaterals  . S/P CABG x 5 04/04/2015    LIMA to LAD, SVG to DIAG, SVG to OM, seqSVG -PDA- dCx  . PAF (paroxysmal atrial fibrillation) (HCC) 03/2015    Noted per-cath & post-op CABG.    Past Surgical History  Procedure Laterality Date  . Cardiac catheterization  1992  . Ventral hernia repair  1994    complicated by infection  . Cholecystectomy  2000    also had revision of hernia repair  . Cystectomy      face and scrotum  . Blepharoplasty    . Lexiscan myoview  June 2010    [facility] to inability to reach target heart rate) --the study was normal with no evidence of ischemia and normal LV function. EF 7  . Cardiac catheterization N/A 03/31/2015    Procedure: Left Heart Cath and Coronary Angiography;  Surgeon: Marykay Lexavid W Harding, MD;  [facility];  Service: Cardiovascular;  Laterality: N/A;  . Coronary artery bypass graft N/A 04/04/2015    Procedure: CORONARY ARTERY BYPASS GRAFTING (CABG);  Surgeon: Delight OvensEdward B Gerhardt, MD;  [facility];  Service: Open Heart Surgery;  Laterality: N/A;  . Tee without cardioversion N/A 04/04/2015    Procedure: TRANSESOPHAGEAL ECHOCARDIOGRAM (TEE);  Surgeon: Delight OvensEdward B Gerhardt, MD;  [facility];  Service: Open Heart Surgery;  Laterality: N/A;   Prior to Admission medications   Medication Sig Start Date End Date Taking? Authorizing Provider  acetaminophen (TYLENOL) 650 MG CR tablet Take 650 mg by mouth every 8 (eight) hours as needed.   Yes Historical Provider, MD  atorvastatin (LIPITOR) 40 MG tablet Take 1 tablet (40 mg total) by mouth daily. 04/20/15  Yes Shelia MediaJennifer A Walker, MD  clopidogrel (PLAVIX) 75 MG tablet Take 1 tablet (75 mg total) by mouth daily. 06/21/15  Yes Marykay Lexavid W Harding, MD  fexofenadine (ALLEGRA) 180 MG tablet Take 180 mg by mouth daily.   Yes Historical Provider, MD  finasteride (PROSCAR) 5 MG tablet Take 1 tablet (5 mg total) by mouth daily. 12/17/14  Yes Shelia MediaJennifer A Walker, MD  gabapentin  (NEURONTIN) 400 MG capsule Take 1 capsule (400 mg total) by mouth 3 (three) times daily. 12/17/14  Yes Shelia MediaJennifer A Walker, MD  metoprolol tartrate (LOPRESSOR) 25 MG tablet Take 0.5 tablets (12.5 mg total) by mouth 2 (two) times daily. 06/21/15  Yes Marykay Lexavid W Harding, MD  Multiple Vitamin (MULTIVITAMIN) tablet Take 1 tablet by mouth daily.   Yes Historical Provider, MD  NITROSTAT 0.4 MG SL tablet Place 0.4 mg under the tongue every 5 (five) minutes x 3 doses as needed. (CHEST PAIN) 03/28/15  Yes Historical Provider, MD  traMADol (ULTRAM) 50 MG tablet Take 1-2 tablets (50-100 mg total) by mouth every 8 (eight) hours as needed. 08/19/15  Yes Shelia MediaJennifer A Walker, MD  traZODone (DESYREL) 50 MG tablet Take 0.5-1 tablets (25-50 mg total) by mouth at  bedtime as needed for sleep. 06/29/15  Yes Shelia Media, MD   No Known Allergies   Social History   Social History  . Marital Status: Married    Spouse Name: N/A  . Number of Children: N/A  . Years of Education: N/A   Social History Main Topics  . Smoking status: Former Smoker -- 2.00 packs/day for 40 years    Types: Cigarettes    Quit date: 10/17/1992  . Smokeless tobacco: Never Used     Comment: quit 1994  . Alcohol Use: 0.0 oz/week    0 Standard drinks or equivalent per week     Comment: seldom  . Drug Use: No  . Sexual Activity: Not Asked   Other Topics Concern  . None   Social History Narrative   Lives in [place] with wife. No pets   Two children.      Former smoker. Quit in 1994. Prior to quitting was unable to walk without exertional dyspnea.      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      Hobbies - reading, drives Corvette, chuch   Family History  Problem Relation Age of Onset  . Coronary artery disease Father   . Heart disease Father   . Stroke Mother   . Stroke Sister   . Prostate cancer Neg Hx   . Kidney disease Neg Hx     Wt Readings from Last  3 Encounters:  10/19/15 209 lb 12 oz (95.142 kg)  08/19/15 210 lb 6 oz (95.425 kg)  07/15/15 208 lb (94.348 kg)    PHYSICAL EXAM BP 120/60 mmHg  Pulse 70  Ht  (1.753 m)  Wt 209 lb 12 oz (95.142 kg)  BMI 30.96 kg/m2 General appearance: alert, cooperative, appears stated age, no distress and borderline obese - notable weight loss since preop HEENT: [place]/AT, EOMI, MMM, anicteric sclera - mild left eye ptosis (had a history of blepharoplasty) Neck: no adenopathy, no carotid bruit, no JVD and supple, symmetrical, trachea midline Lungs: CTAB, normal percussion bilaterally and Nonlabored, good air movement Heart: RRR with normal S1 and S2. Occasional ectopy. No M/R/G.; expected chest wall tenderness Abdomen: soft, non-tender; bowel sounds normal; no masses, no organomegaly and Truncal obesity Extremities: edema 1+ pitting,. Faint pedal pulses - barely palpable Pulses: 1+ bilateral radial with a barely palpable DP/PT, 2+ femoral with + bruit Skin: Distal foot/leg rubor with mild stasis changes. No evidence of cellulitis Neurologic: Mental status: Alert, oriented, thought content appropriate, affect: normal Cranial nerves: Grossly normal He does have chest wall tenderness along both aspects of the sternal border where the sternal wires are. Crepitus is no longer noted   Adult ECG Report  Rate: 70;  Rhythm: normal sinus rhythm and normal axis, intervals & durations.; low voltage in the cortical leads.  Narrative Interpretation: stable, normal EKG   Other studies Reviewed: Additional studies/ records that were reviewed today include:  Recent Labs:   Lab Results  Component Value Date   CHOL 126 06/29/2015   HDL 34.30* 06/29/2015   LDLCALC 53 06/29/2015   TRIG 191.0* 06/29/2015   CHOLHDL 4 06/29/2015     ASSESSMENT / PLAN: Overall doing well. Stable following CABG.  Problem List Items Addressed This Visit    PAF (paroxysmal atrial fibrillation) (HCC) (Chronic)    Maintaining  sinus rhythm. I think this was simply right around the time of his CABG. First noted  during his catheterization. It may very well been related to his CAD. Remains on low-dose beta blocker for rate control. He is not currently on full intake regulation, but is on Plavix.      Essential hypertension (Chronic)    Excellent blood pressure on low-dose beta blocker. I cannot say that he really has true hypertension.      Dyslipidemia, goal LDL below 70 (Chronic)    Now on 40 mg of Lipitor as of December. He has not had recurrent labs since then. In December his HDL was a little low but his LDL was at goal. Labs monitored by PCP.Marland Kitchen      Coronary artery disease involving native coronary artery with angina pectoris (HCC) - Primary (Chronic)    No recent anginal chest pain. Continuing exercise rehabilitation. With his exercise he is not having any pain. He is only on low-dose beta blocker because of fatigue. He is on statin. Continue to encourage slow progression. He deathly says he feels better now than he had preoperatively. But he is not back to his premorbid baseline of a couple years ago.      CAD, multiple vessel (Chronic)    Status post CABG last September. No further anginal symptoms. Would follow ischemic evaluations every 3-4 years post CABG regardless of symptoms.       Other Visit Diagnoses    SOB (shortness of breath) on exertion        Relevant Orders    EKG 12-Lead (Completed)    Chest discomfort        Relevant Orders    EKG 12-Lead (Completed)       Current medicines are reviewed at length with the patient today. (+/- concerns)  The following changes have been made: None    Your physician wants you to follow-up in 6 months DR HARDING - [place]  OFFICE.   Studies Ordered:   Orders Placed This Encounter  Procedures  . EKG 12-Lead      Marykay Lex, M.D., M.S. Interventional Cardiologist   Pager # [phone]

## 2015-10-21 ENCOUNTER — Encounter: Payer: Self-pay | Admitting: Cardiology

## 2015-10-21 NOTE — Assessment & Plan Note (Signed)
Status post CABG last September. No further anginal symptoms. Would follow ischemic evaluations every 3-4 years post CABG regardless of symptoms.

## 2015-10-21 NOTE — Assessment & Plan Note (Signed)
Now on 40 mg of Lipitor as of December. He has not had recurrent labs since then. In December his HDL was a little low but his LDL was at goal. Labs monitored by PCP..Marland Kitchen

## 2015-10-21 NOTE — Assessment & Plan Note (Signed)
No recent anginal chest pain. Continuing exercise rehabilitation. With his exercise he is not having any pain. He is only on low-dose beta blocker because of fatigue. He is on statin. Continue to encourage slow progression. He deathly says he feels better now than he had preoperatively. But he is not back to his premorbid baseline of a couple years ago.

## 2015-10-21 NOTE — Assessment & Plan Note (Addendum)
Excellent blood pressure on low-dose beta blocker. I cannot say that he really has true hypertension.

## 2015-10-21 NOTE — Assessment & Plan Note (Signed)
Maintaining sinus rhythm. I think this was simply right around the time of his CABG. First noted during his catheterization. It may very well been related to his CAD. Remains on low-dose beta blocker for rate control. He is not currently on full intake regulation, but is on Plavix.

## 2015-11-16 ENCOUNTER — Ambulatory Visit: Payer: Medicare Other | Admitting: Internal Medicine

## 2015-11-17 ENCOUNTER — Ambulatory Visit: Payer: Medicare Other

## 2015-11-17 ENCOUNTER — Ambulatory Visit (INDEPENDENT_AMBULATORY_CARE_PROVIDER_SITE_OTHER): Payer: Medicare Other

## 2015-11-17 ENCOUNTER — Ambulatory Visit: Payer: Medicare Other | Admitting: Internal Medicine

## 2015-11-17 VITALS — BP 110/60 | HR 74 | Temp 97.1°F | Resp 14 | Ht 69.0 in | Wt 212.8 lb

## 2015-11-17 DIAGNOSIS — Z0000 Encounter for general adult medical examination without abnormal findings: Secondary | ICD-10-CM | POA: Diagnosis not present

## 2015-11-17 NOTE — Progress Notes (Signed)
Annual Wellness Visit as completed by Health Coach was reviewed in full.  

## 2015-11-17 NOTE — Patient Instructions (Addendum)
  [name] , Thank you for taking time to come for your Medicare Wellness Visit. I appreciate your ongoing commitment to your health goals. Please review the following plan we discussed and let me know if I can assist you in the future.   Return tomorrow for follow up with Dr. Dan HumphreysWalker.   This is a list of the screening recommended for you and due dates:  Health Maintenance  Topic Date Due  .  Hepatitis C: One time screening is recommended by Center for Disease Control  (CDC) for  adults born from [birth year] through 1967.   09-02-1945  . Tetanus Vaccine  07/01/1963  . Shingles Vaccine  06/30/2004  . Pneumonia vaccines (2 of 2 - PPSV23) 12/17/2015  . Flu Shot  02/14/2016  . Colon Cancer Screening  12/17/2022

## 2015-11-17 NOTE — Progress Notes (Signed)
Subjective:   [name] is a 70 y.o. male who presents for an Initial Medicare Annual Wellness Visit.  Review of Systems  No ROS.  Medicare Wellness Visit.  Cardiac Risk Factors include: advanced age (>155men, 55>65 women);male gender;hypertension    Objective:    Today's Vitals   11/17/15 1433  BP: 110/60  Pulse: 74  Temp: 97.1 F (36.2 C)  TempSrc: Oral  Resp: 14  Height: 5\' 9"  (1.753 m)  Weight: 212 lb 12.8 oz (96.525 kg)  SpO2: 95%   Body mass index is 31.41 kg/(m^2).  Current Medications (verified) Outpatient Encounter Prescriptions as of 11/17/2015  Medication Sig  . acetaminophen (TYLENOL) 650 MG CR tablet Take 650 mg by mouth every 8 (eight) hours as needed.  Marland Kitchen. atorvastatin (LIPITOR) 40 MG tablet Take 1 tablet (40 mg total) by mouth daily.  . clopidogrel (PLAVIX) 75 MG tablet Take 1 tablet (75 mg total) by mouth daily.  . fexofenadine (ALLEGRA) 180 MG tablet Take 180 mg by mouth daily.  . finasteride (PROSCAR) 5 MG tablet Take 1 tablet (5 mg total) by mouth daily.  Marland Kitchen. gabapentin (NEURONTIN) 400 MG capsule Take 1 capsule (400 mg total) by mouth 3 (three) times daily.  . metoprolol tartrate (LOPRESSOR) 25 MG tablet Take 0.5 tablets (12.5 mg total) by mouth 2 (two) times daily.  . Multiple Vitamin (MULTIVITAMIN) tablet Take 1 tablet by mouth daily.  Marland Kitchen. NITROSTAT 0.4 MG SL tablet Place 0.4 mg under the tongue every 5 (five) minutes x 3 doses as needed. (CHEST PAIN)  . traMADol (ULTRAM) 50 MG tablet Take 1-2 tablets (50-100 mg total) by mouth every 8 (eight) hours as needed.  . traZODone (DESYREL) 50 MG tablet Take 0.5-1 tablets (25-50 mg total) by mouth at bedtime as needed for sleep.   No facility-administered encounter medications on file as of 11/17/2015.    Allergies (verified) Review of patient's allergies indicates no known allergies.   History: Past Medical History  Diagnosis Date  . Peripheral vascular disease (HCC)     Bilateral ABIs 0.8  . Chronic distal  aortic occlusion (HCC)     By report  . Diabetes mellitus     By his report, this is prediabetes   . SOB (shortness of breath)   . Hyperlipidemia LDL goal <70   . Essential hypertension   . Weakness   . Erectile dysfunction   . Hypogonadism in male     Previously on testosterone injections, but has not yet restarted until further testing  . Enlarged prostate   . Arthritis   . Depression   . History of kidney stones   . Muscle pain   . CAD, multiple vessel 03/2105    LM 65%, mLAD 70%, 90% & 80%, o-pCx 75%, mCx 95% & d Cx 70%, OM1 60%, mRCA 90% with 100% dRCA. R-R collaterals  . S/P CABG x 5 04/04/2015    LIMA to LAD, SVG to DIAG, SVG to OM, seqSVG -PDA- dCx  . PAF (paroxysmal atrial fibrillation) (HCC) 03/2015    Noted per-cath & post-op CABG.   Past Surgical History  Procedure Laterality Date  . Cardiac catheterization  1992  . Ventral hernia repair  1994    complicated by infection  . Cholecystectomy  2000    also had revision of hernia repair  . Cystectomy      face and scrotum  . Blepharoplasty    . Lexiscan myoview  June 2010    converted to BruniLexiscan from  treadmill due to inability to reach target heart rate) --the study was normal with no evidence of ischemia and normal LV function. EF 7  . Cardiac catheterization N/A 03/31/2015    Procedure: Left Heart Cath and Coronary Angiography;  Surgeon: Marykay Lex, MD;  Location[facility] INVASIVE CV LAB;  Service: Cardiovascular;  Laterality: N/A;  . Coronary artery bypass graft N/A 04/04/2015    Procedure: CORONARY ARTERY BYPASS GRAFTING (CABG);  Surgeon: [name], MD;  Location: [facility] OR;  Service: Open Heart Surgery;  Laterality: N/A;  . Tee without cardioversion N/A 04/04/2015    Procedure: TRANSESOPHAGEAL ECHOCARDIOGRAM (TEE);  Surgeon: [name], MD;  Location: [facility] OR;  Service: Open Heart Surgery;  Laterality: N/A;   Family History  Problem Relation Age of Onset  . Coronary artery disease Father   . Heart disease  Father   . Stroke Mother   . Stroke Sister   . Prostate cancer Neg Hx   . Kidney disease Neg Hx    Social History   Occupational History  . Not on file.   Social History Main Topics  . Smoking status: Former Smoker -- 2.00 packs/day for 40 years    Types: Cigarettes    Quit date: 10/17/1992  . Smokeless tobacco: Never Used     Comment: quit 1994  . Alcohol Use: 0.0 oz/week    0 Standard drinks or equivalent per week     Comment: seldom  . Drug Use: No  . Sexual Activity: No   Tobacco Counseling Counseling given: Not Answered   Activities of Daily Living In your present state of health, do you have any difficulty performing the following activities: 11/17/2015 03/31/2015  Hearing? N -  Vision? N -  Difficulty concentrating or making decisions? N -  Walking or climbing stairs? Y -  Dressing or bathing? N -  Doing errands, shopping? N N  Preparing Food and eating ? N -  Using the Toilet? N -  In the past six months, have you accidently leaked urine? Y -  Do you have problems with loss of bowel control? N -  Managing your Medications? N -  Managing your Finances? N -  Housekeeping or managing your Housekeeping? N -    Immunizations and Health Maintenance Immunization History  Administered Date(s) Administered  . Influenza, High Dose Seasonal PF 03/22/2015  . Influenza-Unspecified 02/15/2014  . Pneumococcal Conjugate-13 12/17/2014   Health Maintenance Due  Topic Date Due  . Hepatitis C Screening  02-23-1946  . TETANUS/TDAP  07/01/1963  . ZOSTAVAX  06/30/2004    Patient Care Team: Shelia Media, MD as PCP - General (Internal Medicine)  Indicate any recent Medical Services you may have received from other than [facility] providers in the past year (date may be approximate).    Assessment:   This is a routine wellness examination for [name].  The goal of the wellness visit is to assist the patient how to close the gaps in care and create a preventative care plan for  the patient.   Osteoporosis reviewed.  Medications reviewed; taking without issues or barriers.  Safety issues reviewed; smoke detectors in the home. Firearms locked in a secure area within the home.  Wears seatbelts when driving or riding with others. No violence in the home.  No identified risk were noted; The patient was oriented x 3; appropriate in dress and manner and no objective failures at ADL's or IADL's.   ZOSTAVAX and TDAP vaccine postponed for  follow up with insurance.  Educational material provided.  Peripheral vascular disease-stable and followed by [facility] Josephina Gip, MD and PCP. Athscl extrm ntv art NOS-stable and followed by- PCP.  Patient Concerns:  C/O cerumen impaction; requests assessment.  Increased fatigue; unsteady gait x5 days.  Shooting pain in L side of chest when coughing/sneezing.  Hepatitis C Screening.  Testosterone levels for possible restart of medication.  Deferred to PCP for follow up.  Follow up appointment scheduled.     Hearing/Vision screen Hearing Screening Comments: Passed the whisper test Vision Screening Comments: Followed by [facility] Last OV 2016 Wears glasses  Dietary issues and exercise activities discussed: Current Exercise Habits: Structured exercise class, Type of exercise: treadmill;strength training/weights, Time (Minutes): 45, Intensity: Mild  Goals    . Healthy Lifestyle     Stay hydrated and drink plenty of fluids. Low carb foods.  Lean meats and vegetables. Stay active and continuing exercising at the [facility].      Depression Screen PHQ 2/9 Scores 11/17/2015 05/19/2015  PHQ - 2 Score 0 2  PHQ- 9 Score - 6    Fall Risk Fall Risk  11/17/2015 05/19/2015  Falls in the past year? No No    Cognitive Function: MMSE - Mini Mental State Exam 11/17/2015  Orientation to time 5  Orientation to Place 5  Registration 3  Attention/ Calculation 5  Recall 3  Language- name 2 objects 2  Language- repeat 1  Language-  follow 3 step command 3  Language- read & follow direction 1  Write a sentence 1  Copy design 1  Total score 30    Screening Tests Health Maintenance  Topic Date Due  . Hepatitis C Screening  [birth year]  . TETANUS/TDAP  07/01/1963  . ZOSTAVAX  06/30/2004  . PNA vac Low Risk Adult (2 of 2 - PPSV23) 12/17/2015  . INFLUENZA VACCINE  02/14/2016  . COLONOSCOPY  12/17/2022        Plan:   End of life planning; Advance aging; Advanced directives discussed. No HCPOA/Living Will on file. Educational material provided for follow up with PCP.     During the course of the visit [name] was educated and counseled about the following appropriate screening and preventive services:   Vaccines to include Pneumoccal, Influenza, Hepatitis B, Td, Zostavax, HCV  Electrocardiogram  Colorectal cancer screening  Cardiovascular disease screening  Diabetes screening  Glaucoma screening  Nutrition counseling  Prostate cancer screening  Smoking cessation counseling  Patient Instructions (the written plan) were given to the patient.   Ashok Pall, LPN   07/21/1094

## 2015-11-18 ENCOUNTER — Encounter: Payer: Self-pay | Admitting: Internal Medicine

## 2015-11-18 ENCOUNTER — Ambulatory Visit (INDEPENDENT_AMBULATORY_CARE_PROVIDER_SITE_OTHER): Payer: Medicare Other | Admitting: Internal Medicine

## 2015-11-18 VITALS — BP 110/62 | HR 76 | Ht 69.0 in | Wt 208.4 lb

## 2015-11-18 DIAGNOSIS — R5382 Chronic fatigue, unspecified: Secondary | ICD-10-CM | POA: Diagnosis not present

## 2015-11-18 DIAGNOSIS — I2510 Atherosclerotic heart disease of native coronary artery without angina pectoris: Secondary | ICD-10-CM | POA: Diagnosis not present

## 2015-11-18 DIAGNOSIS — E291 Testicular hypofunction: Secondary | ICD-10-CM | POA: Insufficient documentation

## 2015-11-18 DIAGNOSIS — R490 Dysphonia: Secondary | ICD-10-CM | POA: Insufficient documentation

## 2015-11-18 LAB — CBC WITH DIFFERENTIAL/PLATELET
Basophils Absolute: 74 cells/uL (ref 0–200)
Basophils Relative: 1 %
Eosinophils Absolute: 222 cells/uL (ref 15–500)
Eosinophils Relative: 3 %
HCT: 40.1 % (ref 38.5–50.0)
Hemoglobin: 13.8 g/dL (ref 13.2–17.1)
Lymphocytes Relative: 38 %
Lymphs Abs: 2812 cells/uL (ref 850–3900)
MCH: 31.8 pg (ref 27.0–33.0)
MCHC: 34.4 g/dL (ref 32.0–36.0)
MCV: 92.4 fL (ref 80.0–100.0)
MPV: 9.7 fL (ref 7.5–12.5)
Monocytes Absolute: 1184 cells/uL — ABNORMAL HIGH (ref 200–950)
Monocytes Relative: 16 %
Neutro Abs: 3108 cells/uL (ref 1500–7800)
Neutrophils Relative %: 42 %
Platelets: 176 10*3/uL (ref 140–400)
RBC: 4.34 MIL/uL (ref 4.20–5.80)
RDW: 14.6 % (ref 11.0–15.0)
WBC: 7.4 10*3/uL (ref 3.8–10.8)

## 2015-11-18 NOTE — Progress Notes (Signed)
Subjective:    Patient ID: [name], male    DOB: 1945/07/30, 70 y.o.   MRN: [id number]  HPI  69YO male presents for follow up.  Occasional, fleeting pain across left chest. Much worse with coughing or sneezing. Voice continues to be hoarse. Some intermittent shortness of breath. Last 2 weeks has felt more weak.  Feeling extremely tired over last several weeks. Not able to complete exercise. 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 Questions if this is related to low testosterone.   Wt Readings from Last 3 Encounters:  11/18/15 208 lb 6.4 oz (94.53 kg)  11/17/15 212 lb 12.8 oz (96.525 kg)  10/19/15 209 lb 12 oz (95.142 kg)   BP Readings from Last 3 Encounters:  11/18/15 110/62  11/17/15 110/60  10/19/15 120/60    Past Medical History  Diagnosis Date  . Peripheral vascular disease (HCC)     Bilateral ABIs 0.8  . Chronic distal aortic occlusion (HCC)     By report  . Diabetes mellitus     By his report, this is prediabetes   . SOB (shortness of breath)   . Hyperlipidemia LDL goal <70   . Essential hypertension   . Weakness   . Erectile dysfunction   . Hypogonadism in male     Previously on testosterone injections, but has not yet restarted until further testing  . Enlarged prostate   . Arthritis   . Depression   . History of kidney stones   . Muscle pain   . CAD, multiple vessel 03/2105    LM 65%, mLAD 70%, 90% & 80%, o-pCx 75%, mCx 95% & d Cx 70%, OM1 60%, mRCA 90% with 100% dRCA. R-R collaterals  . S/P CABG x 5 04/04/2015    LIMA to LAD, SVG to DIAG, SVG to OM, seqSVG -PDA- dCx  . PAF (paroxysmal atrial fibrillation) (HCC) 03/2015    Noted per-cath & post-op CABG.   Family History  Problem Relation Age of Onset  . Coronary artery disease Father   . Heart disease Father   . Stroke Mother   . Stroke Sister   . Prostate cancer Neg Hx   . Kidney disease Neg Hx    Past Surgical History  Procedure Laterality Date  .  Cardiac catheterization  1992  . Ventral hernia repair  1994    complicated by infection  . Cholecystectomy  2000    also had revision of hernia repair  . Cystectomy      face and scrotum  . Blepharoplasty    . Lexiscan myoview  June 2010    converted to [facility] from treadmill due to inability to reach target heart rate) --the study was normal with no evidence of ischemia and normal LV function. EF 7  . Cardiac catheterization N/A 03/31/2015    Procedure: Left Heart Cath and Coronary Angiography;  Surgeon: Marykay Lex, MD;  Location: [facility] INVASIVE CV LAB;  Service: Cardiovascular;  Laterality: N/A;  . Coronary artery bypass graft N/A 04/04/2015    Procedure: CORONARY ARTERY BYPASS GRAFTING (CABG);  Surgeon: [name], MD;  Location: [facility] OR;  Service: Open Heart Surgery;  Laterality: N/A;  . Tee without cardioversion N/A 04/04/2015    Procedure: TRANSESOPHAGEAL ECHOCARDIOGRAM (TEE);  Surgeon: [name], MD;  Location: [facility] OR;  Service: Open Heart Surgery;  Laterality: N/A;   Social History   Social History  . Marital Status: Married  Spouse Name: N/A  . Number of Children: N/A  . Years of Education: N/A   Social History Main Topics  . Smoking status: Former Smoker -- 2.00 packs/day for 40 years    Types: Cigarettes    Quit date: 10/17/1992  . Smokeless tobacco: Never Used     Comment: quit 1994  . Alcohol Use: 0.0 oz/week    0 Standard drinks or equivalent per week     Comment: seldom  . Drug Use: No  . Sexual Activity: No   Other Topics Concern  . None   Social History Narrative   [place] in [place]. No pets   Two children.      Former smoker. Quit in 1994. Prior to quitting was unable to walk without exertional dyspnea.      Work - Retired. 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      Hobbies - reading, drives Corvette, chuch    Review of Systems  Constitutional: Positive for  fatigue. Negative for fever, chills, activity change, appetite change and unexpected weight change.  Eyes: Negative for visual disturbance.  Respiratory: Positive for shortness of breath. Negative for cough.   Cardiovascular: Negative for chest pain, palpitations and leg swelling.  Gastrointestinal: Negative for nausea, vomiting, abdominal pain, diarrhea, constipation and abdominal distention.  Genitourinary: Negative for dysuria, urgency and difficulty urinating.  Musculoskeletal: Negative for arthralgias and gait problem.  Skin: Negative for color change and rash.  Hematological: Negative for adenopathy.  Psychiatric/Behavioral: Negative for suicidal ideas, sleep disturbance and dysphoric mood. The patient is not nervous/anxious.        Objective:    BP 110/62 mmHg  Pulse 76  Ht 5\' 9"  (1.753 m)  Wt 208 lb 6.4 oz (94.53 kg)  BMI 30.76 kg/m2  SpO2 96% Physical Exam  Constitutional: He is oriented to person, place, and time. He appears well-developed and well-nourished. No distress.  HENT:  Head: Normocephalic and atraumatic.  Right Ear: External ear normal.  Left Ear: External ear normal.  Nose: Nose normal.  Mouth/Throat: Oropharynx is clear and moist. No oropharyngeal exudate.  Eyes: Conjunctivae and EOM are normal. Pupils are equal, round, and reactive to light. Right eye exhibits no discharge. Left eye exhibits no discharge. No scleral icterus.  Neck: Normal range of motion. Neck supple. No tracheal deviation present. No thyromegaly present.  Cardiovascular: Normal rate, regular rhythm and normal heart sounds.  Exam reveals no gallop and no friction rub.   No murmur heard. Pulmonary/Chest: Effort normal and breath sounds normal. No accessory muscle usage. No tachypnea. No respiratory distress. He has no decreased breath sounds. He has no wheezes. He has no rhonchi. He has no rales. He exhibits no tenderness.  Musculoskeletal: Normal range of motion. He exhibits no edema.    Lymphadenopathy:    He has no cervical adenopathy.  Neurological: He is alert and oriented to person, place, and time. No cranial nerve deficit. Coordination normal.  Skin: Skin is warm and dry. No rash noted. He is not diaphoretic. No erythema. No pallor.  Psychiatric: He has a normal mood and affect. His behavior is normal. Judgment and thought content normal.          Assessment & Plan:   Problem List Items Addressed This Visit      Unprioritized   Chronic fatigue - Primary    Worsening fatigue. He is concerned this may be related to low testosterone. We discussed  the potential risks of testosterone supplementation. Will check testosterone levels with labs. Will also check thyroid function, electrolytes, B12, blood counts with labs. If labs normal, consider sleep study to evaluate for sleep apnea. Follow up in 4 weeks.      Relevant Orders   CBC with Differential/Platelet   Comprehensive metabolic panel   TSH   Testosterone Total,Free,Bio, Males   B12   Hoarseness of voice    Hoarseness of voice ever since CABG. Will set up ENT evaluation for direct visualization of vocal cords.      Relevant Orders   Ambulatory referral to ENT   Hypogonadism in male    Repeat testosterone levels today. Discussed risks of testosterone replacement. Will set up endocrine evaluation. Question if he might be able to use Clomid to help augment low testosterone levels rather than testosterone supplementation.      Relevant Orders   Ambulatory referral to Endocrinology       Return in about 4 weeks (around 12/16/2015) for Recheck.  Ronna Polio, MD Internal Medicine [facility]

## 2015-11-18 NOTE — Assessment & Plan Note (Signed)
Repeat testosterone levels today. Discussed risks of testosterone replacement. Will set up endocrine evaluation. Question if he might be able to use Clomid to help augment low testosterone levels rather than testosterone supplementation.

## 2015-11-18 NOTE — Patient Instructions (Signed)
We will set up evaluation with ENT and with Endocrinology.

## 2015-11-18 NOTE — Assessment & Plan Note (Signed)
Hoarseness of voice ever since CABG. Will set up ENT evaluation for direct visualization of vocal cords.

## 2015-11-18 NOTE — Assessment & Plan Note (Signed)
Worsening fatigue. He is concerned this may be related to low testosterone. We discussed the potential risks of testosterone supplementation. Will check testosterone levels with labs. Will also check thyroid function, electrolytes, B12, blood counts with labs. If labs normal, consider sleep study to evaluate for sleep apnea. Follow up in 4 weeks.

## 2015-11-19 LAB — COMPREHENSIVE METABOLIC PANEL
ALT: 25 U/L (ref 9–46)
AST: 20 U/L (ref 10–35)
Albumin: 4 g/dL (ref 3.6–5.1)
Alkaline Phosphatase: 59 U/L (ref 40–115)
BUN: 25 mg/dL (ref 7–25)
CO2: 23 mmol/L (ref 20–31)
Calcium: 9.3 mg/dL (ref 8.6–10.3)
Chloride: 105 mmol/L (ref 98–110)
Creat: 1.16 mg/dL (ref 0.70–1.18)
Glucose, Bld: 82 mg/dL (ref 65–99)
Potassium: 4.5 mmol/L (ref 3.5–5.3)
Sodium: 140 mmol/L (ref 135–146)
Total Bilirubin: 0.6 mg/dL (ref 0.2–1.2)
Total Protein: 6.6 g/dL (ref 6.1–8.1)

## 2015-11-19 LAB — TSH: TSH: 2.32 mIU/L (ref 0.40–4.50)

## 2015-11-19 LAB — VITAMIN B12: Vitamin B-12: 606 pg/mL (ref 200–1100)

## 2015-11-21 LAB — TESTOSTERONE TOTAL,FREE,BIO, MALES
Albumin: 4 g/dL (ref 3.6–5.1)
Sex Hormone Binding: 57 nmol/L (ref 22–77)
Testosterone, Bioavailable: 30 ng/dL — ABNORMAL LOW (ref 130.5–681.7)
Testosterone, Free: 16.3 pg/mL — ABNORMAL LOW (ref 47.0–244.0)
Testosterone: 206 ng/dL — ABNORMAL LOW (ref 250–827)

## 2015-12-02 ENCOUNTER — Encounter: Payer: Self-pay | Admitting: Internal Medicine

## 2015-12-02 DIAGNOSIS — R490 Dysphonia: Secondary | ICD-10-CM | POA: Diagnosis not present

## 2015-12-02 DIAGNOSIS — K219 Gastro-esophageal reflux disease without esophagitis: Secondary | ICD-10-CM | POA: Diagnosis not present

## 2015-12-02 DIAGNOSIS — H6063 Unspecified chronic otitis externa, bilateral: Secondary | ICD-10-CM | POA: Diagnosis not present

## 2015-12-04 ENCOUNTER — Encounter: Payer: Self-pay | Admitting: Internal Medicine

## 2015-12-04 DIAGNOSIS — N400 Enlarged prostate without lower urinary tract symptoms: Secondary | ICD-10-CM

## 2015-12-05 MED ORDER — FINASTERIDE 5 MG PO TABS
5.0000 mg | ORAL_TABLET | Freq: Every day | ORAL | Status: DC
Start: 2015-12-05 — End: 2016-02-06

## 2015-12-16 ENCOUNTER — Ambulatory Visit: Payer: Medicare Other | Admitting: Internal Medicine

## 2015-12-19 ENCOUNTER — Ambulatory Visit: Payer: Medicare Other | Admitting: Internal Medicine

## 2015-12-23 ENCOUNTER — Ambulatory Visit (INDEPENDENT_AMBULATORY_CARE_PROVIDER_SITE_OTHER): Payer: Medicare Other | Admitting: Endocrinology

## 2015-12-23 ENCOUNTER — Encounter: Payer: Self-pay | Admitting: Endocrinology

## 2015-12-23 VITALS — BP 95/56 | HR 78 | Temp 97.6°F | Ht 69.0 in | Wt 211.8 lb

## 2015-12-23 DIAGNOSIS — E291 Testicular hypofunction: Secondary | ICD-10-CM | POA: Diagnosis not present

## 2015-12-23 DIAGNOSIS — I2510 Atherosclerotic heart disease of native coronary artery without angina pectoris: Secondary | ICD-10-CM | POA: Diagnosis not present

## 2015-12-23 DIAGNOSIS — E1111 Type 2 diabetes mellitus with ketoacidosis with coma: Secondary | ICD-10-CM

## 2015-12-23 DIAGNOSIS — E1311 Other specified diabetes mellitus with ketoacidosis with coma: Secondary | ICD-10-CM | POA: Diagnosis not present

## 2015-12-23 DIAGNOSIS — N138 Other obstructive and reflux uropathy: Secondary | ICD-10-CM

## 2015-12-23 DIAGNOSIS — Z794 Long term (current) use of insulin: Secondary | ICD-10-CM | POA: Diagnosis not present

## 2015-12-23 DIAGNOSIS — N401 Enlarged prostate with lower urinary tract symptoms: Secondary | ICD-10-CM

## 2015-12-23 LAB — PSA: PSA: 0.57 ng/mL (ref 0.10–4.00)

## 2015-12-23 LAB — LUTEINIZING HORMONE: LH: 31.64 m[IU]/mL (ref 3.10–34.60)

## 2015-12-23 LAB — POCT GLYCOSYLATED HEMOGLOBIN (HGB A1C): Hemoglobin A1C: 5.9

## 2015-12-23 NOTE — Patient Instructions (Addendum)
It is not safe to take viagra or a similar medication, until your blood pressure is higher, and cardiology says it is OK.   Please see a urology specialist.  you will receive a phone call, about a day and time for an appointment.

## 2015-12-23 NOTE — Progress Notes (Signed)
Pre visit review using our clinic tool,if applicable. No additional management support is needed unless otherwise documented below in the visit note.  

## 2015-12-23 NOTE — Progress Notes (Signed)
Subjective:    Patient ID: [name], male    DOB: 1946/04/06, 70 y.o.   MRN: [id number]  HPI Pt reports he had puberty at the normal age.  He has 2 biological children.  He says he has never taken illicit androgens.  He was found to have low testosterone in approx 2013, and he took testosterone injections from 2013-2015. He does not take antiandrogens or opioids.  He denies any h/o infertility, XRT, or genital infection.  He has never had surgery, or a serious injury to the head or genital area.  He seldom consumes alcohol.  He has dyspnea sensation in the chest, and assoc fatigue.  He takes finasteride for BPH.  He was rx'ed daily cialis, but ins declined.  He no longer takes SL-NTG.   Past Medical History  Diagnosis Date  . Peripheral vascular disease (HCC)     Bilateral ABIs 0.8  . Chronic distal aortic occlusion (HCC)     By report  . Diabetes mellitus     By his report, this is prediabetes   . SOB (shortness of breath)   . Hyperlipidemia LDL goal <70   . Essential hypertension   . Weakness   . Erectile dysfunction   . Hypogonadism in male     Previously on testosterone injections, but has not yet restarted until further testing  . Enlarged prostate   . Arthritis   . Depression   . History of kidney stones   . Muscle pain   . CAD, multiple vessel 03/2105    LM 65%, mLAD 70%, 90% & 80%, o-pCx 75%, mCx 95% & d Cx 70%, OM1 60%, mRCA 90% with 100% dRCA. R-R collaterals  . S/P CABG x 5 04/04/2015    LIMA to LAD, SVG to DIAG, SVG to OM, seqSVG -PDA- dCx  . PAF (paroxysmal atrial fibrillation) (HCC) 03/2015    Noted per-cath & post-op CABG.    Past Surgical History  Procedure Laterality Date  . Cardiac catheterization  1992  . Ventral hernia repair  1994    complicated by infection  . Cholecystectomy  2000    also had revision of hernia repair  . Cystectomy      face and scrotum  . Blepharoplasty    . Lexiscan myoview  June 2010    converted to [facility] from treadmill due  to inability to reach target heart rate) --the study was normal with no evidence of ischemia and normal LV function. EF 7  . Cardiac catheterization N/A 03/31/2015    Procedure: Left Heart Cath and Coronary Angiography;  Surgeon: Marykay Lex, MD;  Location: [facility] INVASIVE CV LAB;  Service: Cardiovascular;  Laterality: N/A;  . Coronary artery bypass graft N/A 04/04/2015    Procedure: CORONARY ARTERY BYPASS GRAFTING (CABG);  Surgeon: [name], MD;  Location: [facility] OR;  Service: Open Heart Surgery;  Laterality: N/A;  . Tee without cardioversion N/A 04/04/2015    Procedure: TRANSESOPHAGEAL ECHOCARDIOGRAM (TEE);  Surgeon: [name], MD;  Location: [facility] OR;  Service: Open Heart Surgery;  Laterality: N/A;    Social History   Social History  . Marital Status: Married    Spouse Name: N/A  . Number of Children: N/A  . Years of Education: N/A   Occupational History  . Not on file.   Social History Main Topics  . Smoking status: Former Smoker -- 2.00 packs/day for 40 years    Types: Cigarettes    Quit date: 10/17/1992  .  Smokeless tobacco: Never Used     Comment: quit 1994  . Alcohol Use: 0.0 oz/week    0 Standard drinks or equivalent per week     Comment: seldom  . Drug Use: No  . Sexual Activity: No   Other Topics Concern  . Not on file   Social History Narrative   [place] in [place]. No pets   Two children.      Former smoker. Quit in 1994. Prior to quitting was unable to walk without exertional dyspnea.      Work - Retired. 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      Hobbies - reading, drives Corvette, chuch    Current Outpatient Prescriptions on File Prior to Visit  Medication Sig Dispense Refill  . acetaminophen (TYLENOL) 650 MG CR tablet Take 650 mg by mouth every 8 (eight) hours as needed.    Marland Kitchen. atorvastatin (LIPITOR) 40 MG tablet Take 1 tablet (40 mg total) by mouth daily. 90 tablet 3  .  clopidogrel (PLAVIX) 75 MG tablet Take 1 tablet (75 mg total) by mouth daily. 90 tablet 3  . fexofenadine (ALLEGRA) 180 MG tablet Take 180 mg by mouth daily.    . finasteride (PROSCAR) 5 MG tablet Take 1 tablet (5 mg total) by mouth daily. 90 tablet 0  . gabapentin (NEURONTIN) 400 MG capsule Take 1 capsule (400 mg total) by mouth 3 (three) times daily. 270 capsule 3  . metoprolol tartrate (LOPRESSOR) 25 MG tablet Take 0.5 tablets (12.5 mg total) by mouth 2 (two) times daily. 90 tablet 3  . Multiple Vitamin (MULTIVITAMIN) tablet Take 1 tablet by mouth daily.    Marland Kitchen. NITROSTAT 0.4 MG SL tablet Place 0.4 mg under the tongue every 5 (five) minutes x 3 doses as needed. (CHEST PAIN)    . traMADol (ULTRAM) 50 MG tablet Take 1-2 tablets (50-100 mg total) by mouth every 8 (eight) hours as needed. 60 tablet 0  . traZODone (DESYREL) 50 MG tablet Take 0.5-1 tablets (25-50 mg total) by mouth at bedtime as needed for sleep. 30 tablet 3   No current facility-administered medications on file prior to visit.    No Known Allergies  Family History  Problem Relation Age of Onset  . Coronary artery disease Father   . Heart disease Father   . Stroke Mother   . Stroke Sister   . Prostate cancer Neg Hx   . Kidney disease Neg Hx   . Other Neg Hx     hypogonadism    BP 95/56 mmHg  Pulse 78  Temp(Src) 97.6 F (36.4 C) (Oral)  Ht 5\' 9"  (1.753 m)  Wt 211 lb 12.8 oz (96.072 kg)  BMI 31.26 kg/m2  SpO2 96%  Review of Systems denies depression, numbness, fever, headache, rash, blurry vision, rhinorrhea, chest pain, dizziness and LOC.  He has ED sxs, muscle weakness, easy bruising, and weight gain.  Decreased urinary stream is well-controlled.      Objective:   Physical Exam VS: see vs page GEN: no distress HEAD: head: no deformity eyes: no periorbital swelling, no proptosis external nose and ears are normal mouth: no lesion seen NECK: supple, thyroid is not enlarged CHEST WALL: no deformity.  Old healed  surgical scar (median sternotomy) LUNGS: clear to auscultation BREASTS:  Mild bilat pseudogynecomastia CV: reg rate and rhythm, no murmur ABD: abdomen is soft, nontender.  no hepatosplenomegaly.  not distended.  no hernia  GENITALIA:  Normal male.   MUSCULOSKELETAL: muscle bulk and strength are grossly normal.  no obvious joint swelling.  gait is normal and steady EXTEMITIES: no deformity.  no ulcer on the feet.  feet are of normal color and temp.  1+ bilat leg edema PULSES: dorsalis pedis intact bilat.  no carotid bruit NEURO:  cn 2-12 grossly intact.   readily moves all 4's.  sensation is intact to touch on the feet SKIN:  Normal texture and temperature.  No rash or suspicious lesion is visible.   NODES:  None palpable at the neck PSYCH: alert, well-oriented.  Does not appear anxious nor depressed.     Head CT (2011) no mention is made of the pituitary:   LH=33  Lab Results  Component Value Date   PSA 0.57 12/23/2015   I have reviewed outside records, and summarized: Pt was seen at [facility] on 09/08/14.  He was noted to have never been in acute urinary retention.  No mention was made if hypogonadism was primary or secondary.      Assessment & Plan:  Primary hypogonadism, new to me, uncertain etiology.  Hypotension: mild this contraindicates viagra, at least for now BPH: in this setting, he should not take testosterone replacement rx.   DOE, not related to hypogonadism.   Patient is advised the following: Patient Instructions  It is not safe to take viagra or a similar medication, until your blood pressure is higher, and cardiology says it is OK.   Please see a urology specialist.  you will receive a phone call, about a day and time for an appointment.    Romero Belling, MD

## 2015-12-24 LAB — PROLACTIN: Prolactin: 7.3 ng/mL (ref 2.0–18.0)

## 2015-12-27 ENCOUNTER — Encounter: Payer: Self-pay | Admitting: Internal Medicine

## 2015-12-27 ENCOUNTER — Ambulatory Visit (INDEPENDENT_AMBULATORY_CARE_PROVIDER_SITE_OTHER): Payer: Medicare Other | Admitting: Internal Medicine

## 2015-12-27 VITALS — BP 108/58 | HR 75 | Ht 69.0 in | Wt 211.6 lb

## 2015-12-27 DIAGNOSIS — R350 Frequency of micturition: Secondary | ICD-10-CM | POA: Diagnosis not present

## 2015-12-27 DIAGNOSIS — I2510 Atherosclerotic heart disease of native coronary artery without angina pectoris: Secondary | ICD-10-CM | POA: Diagnosis not present

## 2015-12-27 DIAGNOSIS — Z79899 Other long term (current) drug therapy: Secondary | ICD-10-CM | POA: Diagnosis not present

## 2015-12-27 DIAGNOSIS — R490 Dysphonia: Secondary | ICD-10-CM

## 2015-12-27 DIAGNOSIS — R42 Dizziness and giddiness: Secondary | ICD-10-CM

## 2015-12-27 DIAGNOSIS — E291 Testicular hypofunction: Secondary | ICD-10-CM

## 2015-12-27 MED ORDER — PANTOPRAZOLE SODIUM 40 MG PO TBEC: 40.0000 mg | DELAYED_RELEASE_TABLET | Freq: Every day | ORAL | Status: DC

## 2015-12-27 NOTE — Assessment & Plan Note (Signed)
Hoarseness improved. Encouraged him to start Pantoprazole as directed by ENT. Rx resent to pharmacy.

## 2015-12-27 NOTE — Assessment & Plan Note (Signed)
Encouraged him to consider carotid dopplers, as I do not see that these have been performed and he has had episodic lightheadedness. He declines for now. Will follow. Encouraged him to stay well hydrated. Also tapering down on Neurontin to see if this helps with symptoms.

## 2015-12-27 NOTE — Progress Notes (Signed)
Subjective:    Patient ID: Orvan Papadakis, male    DOB: 08-Mar-1946, 70 y.o.   MRN: [id number]  HPI  70YO male presents for follow up.  Recently seen by Dr. Everardo All in endocrinology for hypogonadism. Additional labs including prolactin and LH were normal. PSA was normal. He was referred to Urology to discuss testosterone replacement. Recommended against PDI given hypotension. He notes that BP is generally low and he occasionally has brief episodes of lightheadedness. No chest pain, palpitations or other focal neurologic symptoms.  He was also recently seen by ENT. Direct laryngoscopy was normal except for signs of reflux. He was instructed to start Pantoprazole, however he never started this medication. He reports hoarseness has improved.  Wt Readings from Last 3 Encounters:  12/27/15 211 lb 9.6 oz (95.981 kg)  12/23/15 211 lb 12.8 oz (96.072 kg)  11/18/15 208 lb 6.4 oz (94.53 kg)   BP Readings from Last 3 Encounters:  12/27/15 108/58  12/23/15 95/56  11/18/15 110/62    Past Medical History  Diagnosis Date  . Peripheral vascular disease (HCC)     Bilateral ABIs 0.8  . Chronic distal aortic occlusion (HCC)     By report  . Diabetes mellitus     By his report, this is prediabetes   . SOB (shortness of breath)   . Hyperlipidemia LDL goal <70   . Essential hypertension   . Weakness   . Erectile dysfunction   . Hypogonadism in male     Previously on testosterone injections, but has not yet restarted until further testing  . Enlarged prostate   . Arthritis   . Depression   . History of kidney stones   . Muscle pain   . CAD, multiple vessel 03/2105    LM 65%, mLAD 70%, 90% & 80%, o-pCx 75%, mCx 95% & d Cx 70%, OM1 60%, mRCA 90% with 100% dRCA. R-R collaterals  . S/P CABG x 5 04/04/2015    LIMA to LAD, SVG to DIAG, SVG to OM, seqSVG -PDA- dCx  . PAF (paroxysmal atrial fibrillation) (HCC) 03/2015    Noted per-cath & post-op CABG.   Family History  Problem Relation Age of Onset    . Coronary artery disease Father   . Heart disease Father   . Stroke Mother   . Stroke Sister   . Prostate cancer Neg Hx   . Kidney disease Neg Hx   . Other Neg Hx     hypogonadism   Past Surgical History  Procedure Laterality Date  . Cardiac catheterization  1992  . Ventral hernia repair  1994    complicated by infection  . Cholecystectomy  2000    also had revision of hernia repair  . Cystectomy      face and scrotum  . Blepharoplasty    . Lexiscan myoview  June 2010    converted to [facility] from treadmill due to inability to reach target heart rate) --the study was normal with no evidence of ischemia and normal LV function. EF 7  . Cardiac catheterization N/A 03/31/2015    Procedure: Left Heart Cath and Coronary Angiography;  Surgeon: Marykay Lex, MD;  Location: [facility] INVASIVE CV LAB;  Service: Cardiovascular;  Laterality: N/A;  . Coronary artery bypass graft N/A 04/04/2015    Procedure: CORONARY ARTERY BYPASS GRAFTING (CABG);  Surgeon: [name], MD;  Location: [facility] OR;  Service: Open Heart Surgery;  Laterality: N/A;  . Tee without cardioversion N/A 04/04/2015  Procedure: TRANSESOPHAGEAL ECHOCARDIOGRAM (TEE);  Surgeon: Delight OvensEdward B Gerhardt, MD;  Location[facility] OR;  Service: Open Heart Surgery;  Laterality: N/A;   Social History   Social History  . Marital Status: Married    Spouse Name: N/A  . Number of Children: N/A  . Years of Education: N/A   Social History Main Topics  . Smoking status: Former Smoker -- 2.00 packs/day for 40 years    Types: Cigarettes    Quit date: 10/17/1992  . Smokeless tobacco: Never Used     Comment: quit 1994  . Alcohol Use: 0.0 oz/week    0 Standard drinks or equivalent per week     Comment: seldom  . Drug Use: No  . Sexual Activity: No   Other Topics Concern  . None   Social History Narrative   [place] in [place]. No pets   Two children.      Former smoker. Quit in 1994. Prior to quitting was unable to walk without  exertional dyspnea.      Work - Retired. 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      Hobbies - reading, drives Corvette, chuch    Review of Systems  Constitutional: Positive for fatigue. Negative for fever, chills, activity change, appetite change and unexpected weight change.  HENT: Negative for congestion, postnasal drip, sore throat, trouble swallowing and voice change.   Eyes: Negative for visual disturbance.  Respiratory: Negative for cough and shortness of breath.   Cardiovascular: Negative for chest pain, palpitations and leg swelling.  Gastrointestinal: Negative for nausea, vomiting, abdominal pain, diarrhea, constipation and abdominal distention.  Genitourinary: Positive for frequency. Negative for dysuria, urgency, decreased urine volume, scrotal swelling, difficulty urinating, penile pain and testicular pain.  Musculoskeletal: Negative for arthralgias and gait problem.  Skin: Negative for color change and rash.  Neurological: Positive for light-headedness. Negative for dizziness, syncope, weakness, numbness and headaches.  Hematological: Negative for adenopathy.  Psychiatric/Behavioral: Negative for sleep disturbance and dysphoric mood. The patient is not nervous/anxious.        Objective:    BP 108/58 mmHg  Pulse 75  Ht 5\' 9"  (1.753 m)  Wt 211 lb 9.6 oz (95.981 kg)  BMI 31.23 kg/m2  SpO2 97% Physical Exam  Constitutional: He is oriented to person, place, and time. He appears well-developed and well-nourished. No distress.  HENT:  Head: Normocephalic and atraumatic.  Right Ear: External ear normal.  Left Ear: External ear normal.  Nose: Nose normal.  Mouth/Throat: Oropharynx is clear and moist. No oropharyngeal exudate.  Eyes: Conjunctivae and EOM are normal. Pupils are equal, round, and reactive to light. Right eye exhibits no discharge. Left eye exhibits no discharge. No scleral icterus.  Neck: Normal range  of motion. Neck supple. No tracheal deviation present. No thyromegaly present.  Cardiovascular: Normal rate, regular rhythm and normal heart sounds.  Exam reveals no gallop and no friction rub.   No murmur heard. Pulmonary/Chest: Effort normal and breath sounds normal. No accessory muscle usage. No tachypnea. No respiratory distress. He has no decreased breath sounds. He has no wheezes. He has no rhonchi. He has no rales. He exhibits no tenderness.  Musculoskeletal: Normal range of motion. He exhibits no edema.  Lymphadenopathy:    He has no cervical adenopathy.  Neurological: He is alert and oriented to person, place, and time. No cranial nerve deficit. Coordination normal.  Skin: Skin is warm and dry. No rash noted. He  is not diaphoretic. No erythema. No pallor.  Psychiatric: He has a normal mood and affect. His behavior is normal. Judgment and thought content normal.          Assessment & Plan:   Problem List Items Addressed This Visit      Unprioritized   Hoarseness of voice    Hoarseness improved. Encouraged him to start Pantoprazole as directed by ENT. Rx resent to pharmacy.      Hypogonadism male - Primary    Reviewed notes from endocrinology. Urology evaluation pending. We again discussed the potential risks of testosterone replacement. Follow up after urology evaluation.      Lightheadedness    Encouraged him to consider carotid dopplers, as I do not see that these have been performed and he has had episodic lightheadedness. He declines for now. Will follow. Encouraged him to stay well hydrated. Also tapering down on Neurontin to see if this helps with symptoms.      Urinary frequency    Recent UA was normal. He continues to have urinary frequency. Urology evaluation pending. Question if he might benefit from Ditropan.          Return in about 4 weeks (around 01/24/2016) for Recheck.  Ronna Polio, MD Internal Medicine [facility]

## 2015-12-27 NOTE — Assessment & Plan Note (Signed)
Reviewed notes from endocrinology. Urology evaluation pending. We again discussed the potential risks of testosterone replacement. Follow up after urology evaluation.

## 2015-12-27 NOTE — Patient Instructions (Addendum)
Try limiting the Gabapentin to 400mg  once or twice daily.  Start Pantoprazole daily.

## 2015-12-27 NOTE — Assessment & Plan Note (Signed)
Recent UA was normal. He continues to have urinary frequency. Urology evaluation pending. Question if he might benefit from Ditropan.

## 2015-12-28 ENCOUNTER — Other Ambulatory Visit: Payer: Self-pay

## 2015-12-28 ENCOUNTER — Encounter: Payer: Self-pay | Admitting: Family Medicine

## 2015-12-28 ENCOUNTER — Ambulatory Visit (INDEPENDENT_AMBULATORY_CARE_PROVIDER_SITE_OTHER): Payer: Medicare Other | Admitting: Family Medicine

## 2015-12-28 VITALS — BP 102/62 | HR 74 | Ht 69.0 in | Wt 210.0 lb

## 2015-12-28 DIAGNOSIS — I2510 Atherosclerotic heart disease of native coronary artery without angina pectoris: Secondary | ICD-10-CM

## 2015-12-28 DIAGNOSIS — M25552 Pain in left hip: Secondary | ICD-10-CM

## 2015-12-28 DIAGNOSIS — M7062 Trochanteric bursitis, left hip: Secondary | ICD-10-CM

## 2015-12-28 NOTE — Progress Notes (Signed)
Pre visit review using our clinic review tool, if applicable. No additional management support is needed unless otherwise documented below in the visit note. 

## 2015-12-28 NOTE — Patient Instructions (Signed)
Good to see you  Ice 20 minutes 2 times daily. Usually after activity and before bed. Exercises 3 times a week.  If you want we can get physical therapy involved Can repeat injection every 3 months If this does not help for longer then 3 months we may need to look at it again.

## 2015-12-28 NOTE — Assessment & Plan Note (Signed)
Patient was given an injection today. Tolerated procedure well. We discussed icing regimen and home exercises. We discussed which activities to do in which ones to avoid. Patient declined formal physical therapy. Patient come back and see me again on an as-needed basis otherwise. Patient is worsening symptoms I do feel that further evaluation of patient's lumbar spine would be beneficial. Straight leg test today.  Spent  25 minutes with patient face-to-face and had greater than 50% of counseling including as described above in assessment and plan.

## 2015-12-28 NOTE — Progress Notes (Signed)
[name] [address] [place], [place] [address] Phone: [phone] Subjective:    I'm seeing this patient by the request  of:  Marc Dove, MD   CC: Left hip pain Follow-up  [id number] [name] is a 70 y.o. male coming in with complaint of left hip pain. Has been multiple months. Patient does have a greater trochanter bursitis. Last injection was back greater than 6 months ago. States that over the course last 2 weeks started having worsening pain again. Unable to do anti-inflammatory secondary to her other comorbidities. Patient states that it is waking him up at night. Worse after sitting for long amount of time and seems to get better with rest. Denies any significant back pain with it. Maybe some mild weakness of the legs but seems to be bilateral and likely secondary to more sedentary lifestyle.  Past Medical History  Diagnosis Date  . Peripheral vascular disease (HCC)     Bilateral ABIs 0.8  . Chronic distal aortic occlusion (HCC)     By report  . Diabetes mellitus     By his report, this is prediabetes   . SOB (shortness of breath)   . Hyperlipidemia LDL goal <70   . Essential hypertension   . Weakness   . Erectile dysfunction   . Hypogonadism in male     Previously on testosterone injections, but has not yet restarted until further testing  . Enlarged prostate   . Arthritis   . Depression   . History of kidney stones   . Muscle pain   . CAD, multiple vessel 03/2105    LM 65%, mLAD 70%, 90% & 80%, o-pCx 75%, mCx 95% & d Cx 70%, OM1 60%, mRCA 90% with 100% dRCA. R-R collaterals  . S/P CABG x 5 04/04/2015    LIMA to LAD, SVG to DIAG, SVG to OM, seqSVG -PDA- dCx  . PAF (paroxysmal atrial fibrillation) (HCC) 03/2015    Noted per-cath & post-op CABG.   Past Surgical History  Procedure Laterality Date  . Cardiac catheterization  1992  . Ventral hernia repair  1994    complicated by infection  . Cholecystectomy  2000   also had revision of hernia repair  . Cystectomy      face and scrotum  . Blepharoplasty    . Lexiscan myoview  June 2010    converted to [facility] from treadmill due to inability to reach target heart rate) --the study was normal with no evidence of ischemia and normal LV function. EF 7  . Cardiac catheterization N/A 03/31/2015    Procedure: Left Heart Cath and Coronary Angiography;  Surgeon: Marykay Lex, MD;  Location: [facility] INVASIVE CV LAB;  Service: Cardiovascular;  Laterality: N/A;  . Coronary artery bypass graft N/A 04/04/2015    Procedure: CORONARY ARTERY BYPASS GRAFTING (CABG);  Surgeon: [name], MD;  Location: [facility] OR;  Service: Open Heart Surgery;  Laterality: N/A;  . Tee without cardioversion N/A 04/04/2015    Procedure: TRANSESOPHAGEAL ECHOCARDIOGRAM (TEE);  Surgeon: [name], MD;  Location: [facility] OR;  Service: Open Heart Surgery;  Laterality: N/A;   Social History   Social History  . Marital Status: Married    Spouse Name: N/A  . Number of Children: N/A  . Years of Education: N/A   Occupational History  . Not on file.   Social History Main Topics  . Smoking status: Former Smoker -- 2.00 packs/day for 40 years  Types: Cigarettes    Quit date: 10/17/1992  . Smokeless tobacco: Never Used     Comment: quit 1994  . Alcohol Use: 0.0 oz/week    0 Standard drinks or equivalent per week     Comment: seldom  . Drug Use: No  . Sexual Activity: No   Other Topics Concern  . Not on file   Social History Narrative   Lives in [place] with wife. No pets   Two children.      Former smoker. Quit in 1994. Prior to quitting was unable to walk without exertional dyspnea.      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      Hobbies - reading, drives Corvette, chuch   No Known Allergies Family History  Problem Relation Age of Onset  . Coronary artery disease Father   . Heart disease Father    . Stroke Mother   . Stroke Sister   . Prostate cancer Neg Hx   . Kidney disease Neg Hx   . Other Neg Hx     hypogonadism        Past medical history, social, surgical and family history all reviewed in electronic medical record.   Review of Systems: No headache, visual changes, nausea, vomiting, diarrhea, constipation, dizziness, abdominal pain, skin rash, fevers, chills, night sweats, weight loss, swollen lymph nodes, body aches, joint swelling, muscle aches, chest pain, shortness of breath, mood changes.   Objective Blood pressure 102/62, pulse 74, height 5\' 9"  (1.753 m), weight 210 lb (95.255 kg), SpO2 97 %.  General: No apparent distress alert and oriented x3 mood and affect normal, dressed appropriately.  HEENT: Pupils equal, extraocular movements intact  Respiratory: Patient's speak in full sentences and does not appear short of breath  Cardiovascular: No lower extremity edema, non tender, no erythema  Skin: Warm dry intact with no signs of infection or rash on extremities or on axial skeleton.  Abdomen: Soft nontender  Neuro: Cranial nerves II through XII are intact, neurovascularly intact in all extremities with 2+ DTRs and 2+ pulses.  Lymph: No lymphadenopathy of posterior or anterior cervical chain or axillae bilaterally.  Gait normal with good balance and coordination.  MSK:  Non tender with full range of motion and good stability and symmetric strength and tone of shoulders, elbows, wrist,  knee and ankles bilaterally.  Hip: Left ROM IR: 25 Deg, ER: 25 Deg, Flexion: 120 Deg, Extension: 100 Deg, Abduction: 45 Deg, Adduction: 45 Deg Strength IR: 5/5, ER: 5/5, Flexion: 5/5, Extension: 5/5, Abduction: 4/5, Adduction: 5/5 Pelvic alignment unremarkable to inspection and palpation. Standing hip rotation and gait without trendelenburg sign / unsteadiness. Greater trochanter with severe tenderness to palpation. No tenderness over piriformis  Positive Faber No SI joint  tenderness and normal minimal SI movement. Finally worse than previous exam  MSK US performed of: Left This study was ordered, performed, and interpreted by Terrilee Files D.O.  Hip: Trochanteric bursa with significant hypoechoic changes and swelling Acetabular labrum visualized and without tears, displacement, or effusion in joint. Femoral neck appears unremarkable without increased power doppler signal along Cortex.  IMPRESSION:  Greater trochanter bursitis   Procedure: Real-time Ultrasound Guided Injection of left greater trochanteric bursitis secondary to patient's body habitus Device: GE Logiq E  Ultrasound guided injection is preferred based studies that show increased duration, increased effect, greater accuracy, decreased procedural pain, increased response rate, and decreased cost with ultrasound guided  versus blind injection.  Verbal informed consent obtained.  Time-out conducted.  Noted no overlying erythema, induration, or other signs of local infection.  Skin prepped in a sterile fashion.  Local anesthesia: Topical Ethyl chloride.  With sterile technique and under real time ultrasound guidance:  Greater trochanteric area was visualized and patient's bursa was noted. A 22-gauge 3 inch needle was inserted and 4 cc of 0.5% Marcaine and 1 cc of Kenalog 40 mg/dL was injected. Pictures taken Completed without difficulty  Pain immediately resolved suggesting accurate placement of the medication.  Advised to call if fevers/chills, erythema, induration, drainage, or persistent bleeding.  Images permanently stored and available for review in the ultrasound unit.  Impression: Technically successful ultrasound guided injection.       Impression and Recommendations:     This case required medical decision making of moderate complexity.

## 2016-01-01 ENCOUNTER — Encounter: Payer: Self-pay | Admitting: Internal Medicine

## 2016-01-01 ENCOUNTER — Encounter: Payer: Self-pay | Admitting: Endocrinology

## 2016-01-02 NOTE — Telephone Encounter (Signed)
Please advise 

## 2016-01-03 ENCOUNTER — Ambulatory Visit (INDEPENDENT_AMBULATORY_CARE_PROVIDER_SITE_OTHER): Payer: Medicare Other | Admitting: Family

## 2016-01-03 ENCOUNTER — Encounter: Payer: Self-pay | Admitting: Family

## 2016-01-03 VITALS — BP 110/60 | HR 81 | Temp 98.2°F | Ht 69.0 in | Wt 210.0 lb

## 2016-01-03 DIAGNOSIS — R229 Localized swelling, mass and lump, unspecified: Secondary | ICD-10-CM | POA: Diagnosis not present

## 2016-01-03 DIAGNOSIS — I2510 Atherosclerotic heart disease of native coronary artery without angina pectoris: Secondary | ICD-10-CM

## 2016-01-03 NOTE — Telephone Encounter (Signed)
Appointment today at 1:30 pm

## 2016-01-03 NOTE — Patient Instructions (Signed)
Unsure of swelling of right clavicle at this time. We will do an ultrasound to further investigate.  If there is no improvement in your symptoms, or if there is any worsening of symptoms, or if you have any additional concerns, please return for re-evaluation; or, if we are closed, consider going to the Emergency Room for evaluation if symptoms urgent.

## 2016-01-03 NOTE — Progress Notes (Signed)
Subjective:    Patient ID: [name], male    DOB: 11/04/1945, 70 y.o.   MRN: [id number]   [name] is a 69 y.o. male who presents today for an acute visit.    HPI Comments: Patient here for evaluation of "cyst" on right collarbone, noticed it one month ago. May have grown in size ' though not sure.'  Describes the pain as bruise. Worse when sleeping on right side in fetal position or when reaching with right arm. Nothing makes it better. Hasnt tried any medications.no difficulty swallowing. No injury.  Had CABG 03/2015 and has had some pulling from scar. Denies exertional chest pain or pressure, numbness or tingling radiating to left arm or jaw, palpitations, dizziness, frequent headaches, changes in vision, or shortness of breath.     Past Medical History  Diagnosis Date  . Peripheral vascular disease (HCC)     Bilateral ABIs 0.8  . Chronic distal aortic occlusion (HCC)     By report  . Diabetes mellitus     By his report, this is prediabetes   . SOB (shortness of breath)   . Hyperlipidemia LDL goal <70   . Essential hypertension   . Weakness   . Erectile dysfunction   . Hypogonadism in male     Previously on testosterone injections, but has not yet restarted until further testing  . Enlarged prostate   . Arthritis   . Depression   . History of kidney stones   . Muscle pain   . CAD, multiple vessel 03/2105    LM 65%, mLAD 70%, 90% & 80%, o-pCx 75%, mCx 95% & d Cx 70%, OM1 60%, mRCA 90% with 100% dRCA. R-R collaterals  . S/P CABG x 5 04/04/2015    LIMA to LAD, SVG to DIAG, SVG to OM, seqSVG -PDA- dCx  . PAF (paroxysmal atrial fibrillation) (HCC) 03/2015    Noted per-cath & post-op CABG.   Allergies: Review of patient's allergies indicates no known allergies. Current Outpatient Prescriptions on File Prior to Visit  Medication Sig Dispense Refill  . acetaminophen (TYLENOL) 650 MG CR tablet Take 650 mg by mouth every 8 (eight) hours as needed. Reported on 01/03/2016    .  atorvastatin (LIPITOR) 40 MG tablet Take 1 tablet (40 mg total) by mouth daily. 90 tablet 3  . clopidogrel (PLAVIX) 75 MG tablet Take 1 tablet (75 mg total) by mouth daily. 90 tablet 3  . fexofenadine (ALLEGRA) 180 MG tablet Take 180 mg by mouth daily. Reported on 01/03/2016    . finasteride (PROSCAR) 5 MG tablet Take 1 tablet (5 mg total) by mouth daily. 90 tablet 0  . metoprolol tartrate (LOPRESSOR) 25 MG tablet Take 0.5 tablets (12.5 mg total) by mouth 2 (two) times daily. 90 tablet 3  . Multiple Vitamin (MULTIVITAMIN) tablet Take 1 tablet by mouth daily. Reported on 01/03/2016    . NITROSTAT 0.4 MG SL tablet Place 0.4 mg under the tongue every 5 (five) minutes x 3 doses as needed. Reported on 01/03/2016    . gabapentin (NEURONTIN) 400 MG capsule Take 1 capsule (400 mg total) by mouth 3 (three) times daily. (Patient not taking: Reported on 01/03/2016) 270 capsule 3   No current facility-administered medications on file prior to visit.    Social History  Substance Use Topics  . Smoking status: Former Smoker -- 2.00 packs/day for 40 years    Types: Cigarettes    Quit date: 10/17/1992  . Smokeless tobacco: Never Used  Comment: quit 1994  . Alcohol Use: 0.0 oz/week    0 Standard drinks or equivalent per week     Comment: seldom    Review of Systems  Constitutional: Negative for fever and chills.  Respiratory: Negative for cough.   Cardiovascular: Negative for chest pain and palpitations.  Gastrointestinal: Negative for nausea and vomiting.      Objective:    BP 110/60 mmHg  Pulse 81  Temp(Src) 98.2 F (36.8 C)  Ht 5\' 9"  (1.753 m)  Wt 210 lb (95.255 kg)  BMI 31.00 kg/m2  SpO2 95%   Physical Exam  Constitutional: He appears well-developed and well-nourished.  Neck: Normal range of motion. Neck supple. No thyroid mass and no thyromegaly present.  Cardiovascular: Regular rhythm and normal heart sounds.   No carotid bruits.  Pulmonary/Chest: Effort normal and breath sounds  normal. No respiratory distress. He has no wheezes. He has no rhonchi. He has no rales.    Lymphadenopathy:       Head (left side): No submandibular and no preauricular adenopathy present.  Neurological: He is alert.  Skin: Skin is warm and dry.  Psychiatric: He has a normal mood and affect. His speech is normal and behavior is normal.  Vitals reviewed.      Assessment & Plan:   1. Local superficial swelling, mass or lump Etiology of soft tissue swelling of right clavicle is unclear at this point. Patient has no worrisome features including chest pain, difficulty swallowing, recent injury. Likely lipoma. Pending US to further investigate.  - US Soft Tissue Head/Neck; Future    I am having Mr. Roderic ScarceEaton maintain his multivitamin, acetaminophen, gabapentin, fexofenadine, NITROSTAT, atorvastatin, clopidogrel, metoprolol tartrate, and finasteride.   No orders of the defined types were placed in this encounter.     Start medications as prescribed and explained to patient on After Visit Summary ( AVS). Risks, benefits, and alternatives of the medications and treatment plan prescribed today were discussed, and patient expressed understanding.   Education regarding symptom management and diagnosis given to patient.   Follow-up:Plan follow-up and return precautions given if any worsening symptoms or change in condition.   Continue to follow with Wynona DoveWALKER,[name], MD for routine health maintenance.   Essie HartFrank Moultry and I agreed with plan.   Rennie PlowmanMargaret Taylie Helder, FNP

## 2016-01-06 ENCOUNTER — Ambulatory Visit
Admission: RE | Admit: 2016-01-06 | Discharge: 2016-01-06 | Disposition: A | Discharge status: Home / Self Care | Payer: Medicare Other | Source: Ambulatory Visit | Attending: Family | Admitting: Family

## 2016-01-06 DIAGNOSIS — R229 Localized swelling, mass and lump, unspecified: Secondary | ICD-10-CM | POA: Insufficient documentation

## 2016-01-06 DIAGNOSIS — R221 Localized swelling, mass and lump, neck: Secondary | ICD-10-CM | POA: Diagnosis not present

## 2016-01-09 ENCOUNTER — Telehealth: Payer: Self-pay | Admitting: Internal Medicine

## 2016-01-09 NOTE — Telephone Encounter (Signed)
Pt given results again and the instructions that were listed on the AVS after OV on 01/03/2016. [facility].

## 2016-01-09 NOTE — Telephone Encounter (Signed)
Pt called back needing more clarification from the call he received this morning. Thank you!  Call pt @ [phone]. Thank you!

## 2016-01-11 ENCOUNTER — Ambulatory Visit (INDEPENDENT_AMBULATORY_CARE_PROVIDER_SITE_OTHER): Payer: Medicare Other | Admitting: Internal Medicine

## 2016-01-11 ENCOUNTER — Encounter: Payer: Self-pay | Admitting: Internal Medicine

## 2016-01-11 VITALS — BP 126/70 | HR 81 | Ht 69.0 in | Wt 208.8 lb

## 2016-01-11 DIAGNOSIS — J449 Chronic obstructive pulmonary disease, unspecified: Secondary | ICD-10-CM | POA: Insufficient documentation

## 2016-01-11 DIAGNOSIS — M954 Acquired deformity of chest and rib: Secondary | ICD-10-CM | POA: Insufficient documentation

## 2016-01-11 DIAGNOSIS — I2510 Atherosclerotic heart disease of native coronary artery without angina pectoris: Secondary | ICD-10-CM

## 2016-01-11 DIAGNOSIS — R0600 Dyspnea, unspecified: Secondary | ICD-10-CM | POA: Diagnosis not present

## 2016-01-11 DIAGNOSIS — Z1159 Encounter for screening for other viral diseases: Secondary | ICD-10-CM | POA: Diagnosis not present

## 2016-01-11 DIAGNOSIS — R221 Localized swelling, mass and lump, neck: Secondary | ICD-10-CM | POA: Diagnosis not present

## 2016-01-11 NOTE — Progress Notes (Signed)
Pre visit review using our clinic review tool, if applicable. No additional management support is needed unless otherwise documented below in the visit note. 

## 2016-01-11 NOTE — Progress Notes (Signed)
Subjective:    Patient ID: [name], male    DOB: 03/01/1946, 70 y.o.   MRN: [id number]  HPI  70YO male presents for follow up.  Sternal notch pain/swelling -  Having pinching pain over upper sternum and sternal notch over last month. Feels "bruised." No known injuries. Occasional dyspnea with exertion. No cough. No congestion. Not taking any medication for pain.   Wt Readings from Last 3 Encounters:  01/11/16 208 lb 12.8 oz (94.711 kg)  01/03/16 210 lb (95.255 kg)  12/28/15 210 lb (95.255 kg)   BP Readings from Last 3 Encounters:  01/11/16 126/70  01/03/16 110/60  12/28/15 102/62    Past Medical History  Diagnosis Date  . Peripheral vascular disease (HCC)     Bilateral ABIs 0.8  . Chronic distal aortic occlusion (HCC)     By report  . Diabetes mellitus     By his report, this is prediabetes   . SOB (shortness of breath)   . Hyperlipidemia LDL goal <70   . Essential hypertension   . Weakness   . Erectile dysfunction   . Hypogonadism in male     Previously on testosterone injections, but has not yet restarted until further testing  . Enlarged prostate   . Arthritis   . Depression   . History of kidney stones   . Muscle pain   . CAD, multiple vessel 03/2105    LM 65%, mLAD 70%, 90% & 80%, o-pCx 75%, mCx 95% & d Cx 70%, OM1 60%, mRCA 90% with 100% dRCA. R-R collaterals  . S/P CABG x 5 04/04/2015    LIMA to LAD, SVG to DIAG, SVG to OM, seqSVG -PDA- dCx  . PAF (paroxysmal atrial fibrillation) ([place]) 03/2015    Noted per-cath & post-op CABG.   Family History  Problem Relation Age of Onset  . Coronary artery disease Father   . Heart disease Father   . Stroke Mother   . Stroke Sister   . Prostate cancer Neg Hx   . Kidney disease Neg Hx   . Other Neg Hx     hypogonadism   Past Surgical History  Procedure Laterality Date  . Cardiac catheterization  1992  . Ventral hernia repair  9417    complicated by infection  . Cholecystectomy  2000    also had revision of  hernia repair  . Cystectomy      face and scrotum  . Blepharoplasty    . Lexiscan myoview  June 2010    converted to [facility] from treadmill due to inability to reach target heart rate) --the study was normal with no evidence of ischemia and normal LV function. EF 7  . Cardiac catheterization N/A 03/31/2015    Procedure: Left Heart Cath and Coronary Angiography;  Surgeon: Leonie Man, MD;  Location: [facility] CV LAB;  Service: Cardiovascular;  Laterality: N/A;  . Coronary artery bypass graft N/A 04/04/2015    Procedure: CORONARY ARTERY BYPASS GRAFTING (CABG);  Surgeon: Grace Isaac, MD;  Location: [place];  Service: Open Heart Surgery;  Laterality: N/A;  . Tee without cardioversion N/A 04/04/2015    Procedure: TRANSESOPHAGEAL ECHOCARDIOGRAM (TEE);  Surgeon: Grace Isaac, MD;  Location: [place];  Service: Open Heart Surgery;  Laterality: N/A;   Social History   Social History  . Marital Status: Married    Spouse Name: N/A  . Number of Children: N/A  . Years of Education: N/A   Social History Main Topics  .  Smoking status: Former Smoker -- 2.00 packs/day for 40 years    Types: Cigarettes    Quit date: 10/17/1992  . Smokeless tobacco: Never Used     Comment: quit 1994  . Alcohol Use: 0.0 oz/week    0 Standard drinks or equivalent per week     Comment: seldom  . Drug Use: No  . Sexual Activity: No   Other Topics Concern  . None   Social History Narrative   Lives in [place] with wife. No pets   Two children.      Former smoker. Quit in 1994. Prior to quitting was unable to walk without exertional dyspnea.      Work - Retired. 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      Hobbies - reading, drives Corvette, chuch    Review of Systems  Constitutional: Negative for fever, chills, activity change, appetite change, fatigue and unexpected weight change.  Eyes: Negative for visual disturbance.  Respiratory:  Positive for shortness of breath. Negative for cough, chest tightness and wheezing.   Cardiovascular: Negative for chest pain, palpitations and leg swelling.  Gastrointestinal: Negative for abdominal pain, diarrhea, constipation and abdominal distention.  Genitourinary: Negative for dysuria, urgency and difficulty urinating.  Musculoskeletal: Negative for arthralgias and gait problem.  Skin: Negative for color change and rash.  Hematological: Negative for adenopathy.  Psychiatric/Behavioral: Negative for sleep disturbance and dysphoric mood. The patient is not nervous/anxious.        Objective:    BP 126/70 mmHg  Pulse 81  Ht '5\' 9"'  (1.753 m)  Wt 208 lb 12.8 oz (94.711 kg)  BMI 30.82 kg/m2  SpO2 97% Physical Exam  Constitutional: He is oriented to person, place, and time. He appears well-developed and well-nourished. No distress.  HENT:  Head: Normocephalic and atraumatic.  Right Ear: External ear normal.  Left Ear: External ear normal.  Nose: Nose normal.  Mouth/Throat: Oropharynx is clear and moist.  Eyes: Conjunctivae and EOM are normal. Pupils are equal, round, and reactive to light. Right eye exhibits no discharge. Left eye exhibits no discharge. No scleral icterus.  Neck: Normal range of motion. Neck supple. No tracheal deviation present. No thyromegaly present.  Cardiovascular: Normal rate, regular rhythm and normal heart sounds.  Exam reveals no gallop and no friction rub.   No murmur heard. Pulmonary/Chest: Effort normal and breath sounds normal. No accessory muscle usage. No tachypnea. No respiratory distress. He has no decreased breath sounds. He has no wheezes. He has no rhonchi. He has no rales. He exhibits no tenderness.    Musculoskeletal: Normal range of motion. He exhibits no edema.  Lymphadenopathy:    He has no cervical adenopathy.  Neurological: He is alert and oriented to person, place, and time. No cranial nerve deficit. Coordination normal.  Skin: Skin is  warm and dry. No rash noted. He is not diaphoretic. No erythema. No pallor.  Psychiatric: He has a normal mood and affect. His behavior is normal. Judgment and thought content normal.          Assessment & Plan:   Problem List Items Addressed This Visit      Unprioritized   Chest wall deformity - Primary    Prominent clavicle with swelling superior to clavicle. Likely related to scaring from surgery, however given dyspnea and swelling, will get CT of chest for further evaluation. Follow up after CT.      Relevant Orders   CT  Chest W Contrast   Dyspnea   Relevant Orders   CT Chest W Contrast   Comp Met (CMET)   Neck swelling   Relevant Orders   CT Chest W Contrast    Other Visit Diagnoses    Need for hepatitis C screening test        Relevant Orders    Hepatitis C antibody        Return in about 2 weeks (around 01/25/2016) for Recheck.  Ronette Deter, MD Internal Medicine [facility]

## 2016-01-11 NOTE — Assessment & Plan Note (Signed)
Prominent clavicle with swelling superior to clavicle. Likely related to scaring from surgery, however given dyspnea and swelling, will get CT of chest for further evaluation. Follow up after CT.

## 2016-01-11 NOTE — Patient Instructions (Signed)
We will schedule CT of the chest.  Follow up in 2 weeks.

## 2016-01-12 LAB — COMPREHENSIVE METABOLIC PANEL
ALT: 22 U/L (ref 0–53)
AST: 18 U/L (ref 0–37)
Albumin: 3.8 g/dL (ref 3.5–5.2)
Alkaline Phosphatase: 61 U/L (ref 39–117)
BUN: 28 mg/dL — ABNORMAL HIGH (ref 6–23)
CO2: 28 mEq/L (ref 19–32)
Calcium: 8.9 mg/dL (ref 8.4–10.5)
Chloride: 105 mEq/L (ref 96–112)
Creatinine, Ser: 1.13 mg/dL (ref 0.40–1.50)
GFR: 67.89 mL/min (ref >60.00)
Glucose, Bld: 176 mg/dL — ABNORMAL HIGH (ref 70–99)
Potassium: 4 mEq/L (ref 3.5–5.1)
Sodium: 139 mEq/L (ref 135–145)
Total Bilirubin: 0.4 mg/dL (ref 0.2–1.2)
Total Protein: 6.8 g/dL (ref 6.0–8.3)

## 2016-01-12 LAB — HEPATITIS C ANTIBODY: HCV Ab: NEGATIVE

## 2016-01-13 ENCOUNTER — Other Ambulatory Visit: Payer: Self-pay | Admitting: *Deleted

## 2016-01-13 ENCOUNTER — Other Ambulatory Visit: Payer: Medicare Other

## 2016-01-13 DIAGNOSIS — R7309 Other abnormal glucose: Secondary | ICD-10-CM

## 2016-01-19 ENCOUNTER — Ambulatory Visit: Payer: Medicare Other

## 2016-01-22 ENCOUNTER — Encounter: Payer: Self-pay | Admitting: Internal Medicine

## 2016-01-23 ENCOUNTER — Ambulatory Visit
Admission: RE | Admit: 2016-01-23 | Discharge: 2016-01-23 | Disposition: A | Discharge status: Home / Self Care | Payer: Medicare Other | Source: Ambulatory Visit | Attending: Internal Medicine | Admitting: Internal Medicine

## 2016-01-23 ENCOUNTER — Telehealth: Payer: Self-pay

## 2016-01-23 ENCOUNTER — Ambulatory Visit: Payer: Medicare Other | Admitting: Internal Medicine

## 2016-01-23 DIAGNOSIS — I2510 Atherosclerotic heart disease of native coronary artery without angina pectoris: Secondary | ICD-10-CM | POA: Insufficient documentation

## 2016-01-23 DIAGNOSIS — I517 Cardiomegaly: Secondary | ICD-10-CM | POA: Insufficient documentation

## 2016-01-23 DIAGNOSIS — Z951 Presence of aortocoronary bypass graft: Secondary | ICD-10-CM | POA: Insufficient documentation

## 2016-01-23 DIAGNOSIS — N202 Calculus of kidney with calculus of ureter: Secondary | ICD-10-CM | POA: Insufficient documentation

## 2016-01-23 DIAGNOSIS — R0600 Dyspnea, unspecified: Secondary | ICD-10-CM | POA: Diagnosis not present

## 2016-01-23 DIAGNOSIS — J439 Emphysema, unspecified: Secondary | ICD-10-CM | POA: Insufficient documentation

## 2016-01-23 DIAGNOSIS — R911 Solitary pulmonary nodule: Secondary | ICD-10-CM | POA: Insufficient documentation

## 2016-01-23 DIAGNOSIS — N134 Hydroureter: Secondary | ICD-10-CM | POA: Insufficient documentation

## 2016-01-23 DIAGNOSIS — M954 Acquired deformity of chest and rib: Secondary | ICD-10-CM

## 2016-01-23 DIAGNOSIS — I700 Atherosclerosis of aorta: Secondary | ICD-10-CM | POA: Insufficient documentation

## 2016-01-23 DIAGNOSIS — R221 Localized swelling, mass and lump, neck: Secondary | ICD-10-CM | POA: Diagnosis not present

## 2016-01-23 MED ORDER — IOPAMIDOL (ISOVUE-370) INJECTION 76%
75.0000 mL | Freq: Once | INTRAVENOUS | Status: AC | PRN
  Administered 2016-01-23: 75 mL via INTRAVENOUS

## 2016-01-23 NOTE — Addendum Note (Signed)
Addended by: Felix AhmadiFRANSEN, Eldo Umanzor A on: 01/23/2016 03:43 PM   Modules accepted: Orders

## 2016-01-23 NOTE — Progress Notes (Signed)
Patient has been scheduled and is aware of his appointment on 7.12.17 @ 1:00 with Dr. Dan HumphreysWalker

## 2016-01-23 NOTE — Telephone Encounter (Signed)
Noted thanks °

## 2016-01-23 NOTE — Telephone Encounter (Signed)
[facility] with Contrast.  Impression/results in EPIC for review. thanks

## 2016-01-23 NOTE — Telephone Encounter (Signed)
Yes, result note sent to patient. Attempting to move his follow up to an earlier slot

## 2016-01-25 ENCOUNTER — Encounter: Payer: Self-pay | Admitting: Internal Medicine

## 2016-01-25 ENCOUNTER — Ambulatory Visit (INDEPENDENT_AMBULATORY_CARE_PROVIDER_SITE_OTHER): Payer: Medicare Other | Admitting: Internal Medicine

## 2016-01-25 VITALS — BP 110/60 | HR 74 | Temp 97.6°F | Resp 17 | Ht 69.0 in | Wt 208.4 lb

## 2016-01-25 DIAGNOSIS — I2510 Atherosclerotic heart disease of native coronary artery without angina pectoris: Secondary | ICD-10-CM

## 2016-01-25 DIAGNOSIS — M954 Acquired deformity of chest and rib: Secondary | ICD-10-CM

## 2016-01-25 DIAGNOSIS — N200 Calculus of kidney: Secondary | ICD-10-CM | POA: Diagnosis not present

## 2016-01-25 DIAGNOSIS — J439 Emphysema, unspecified: Secondary | ICD-10-CM | POA: Diagnosis not present

## 2016-01-25 DIAGNOSIS — R911 Solitary pulmonary nodule: Secondary | ICD-10-CM | POA: Diagnosis not present

## 2016-01-25 DIAGNOSIS — E042 Nontoxic multinodular goiter: Secondary | ICD-10-CM | POA: Insufficient documentation

## 2016-01-25 DIAGNOSIS — E041 Nontoxic single thyroid nodule: Secondary | ICD-10-CM

## 2016-01-25 MED ORDER — ATORVASTATIN CALCIUM 40 MG PO TABS: 40.0000 mg | ORAL_TABLET | Freq: Every day | ORAL | Status: DC

## 2016-01-25 NOTE — Patient Instructions (Addendum)
We will set up an evaluation with pulmonary to evaluate COPD.  We will also set up an evaluation with thyroid ultrasound to evaluate thyroid nodule.  Please follow up with urology as scheduled.

## 2016-01-25 NOTE — Assessment & Plan Note (Signed)
Reviewed recent CT chest in detail with pt and his wife. Encouraged use of ibuprofen as needed for chest wall pain at site of previous surgery.

## 2016-01-25 NOTE — Progress Notes (Signed)
Pre-visit discussion using our clinic review tool. No additional management support is needed unless otherwise documented below in the visit note.  

## 2016-01-25 NOTE — Assessment & Plan Note (Signed)
Plan for repeat CT chest in 1 year.

## 2016-01-25 NOTE — Telephone Encounter (Signed)
Medication refilled

## 2016-01-25 NOTE — Assessment & Plan Note (Signed)
Reviewed CT findings of COPD. Will set up pulmonary function testing. He is a former smoker.

## 2016-01-25 NOTE — Progress Notes (Signed)
Subjective:    Patient ID: [name], male    DOB: Mar 23, 1946, 70 y.o.   MRN: [id number]  HPI  69YO male presents for follow up.  Recently seen for sternal pain and had CT chest performed. CT chest showed: Widening of the left sternoclavicular joint, which contains foci of gas, without associated osteolytic changes. This may represent osteoarthritis with prominent vacuum phenomenon, however discitis may have a similar appearance. Status post CABG with chronic nonunion of the sternum at the median sternotomy line, without evidence of erosive bony changes or wall abscess formation. Enlarged heart. Severe coronary artery and aortic calcific atherosclerotic disease. Moderate to severe emphysema, with 4 mm nonspecific ground-glass nodule in the right middle lobe.  6mm stone in right ureter  Has evaluation with urology scheduled next month. Continues to have some generalized fatigue and dyspnea on exertion.  Wt Readings from Last 3 Encounters:  01/25/16 208 lb 6 oz (94.518 kg)  01/11/16 208 lb 12.8 oz (94.711 kg)  01/03/16 210 lb (95.255 kg)   BP Readings from Last 3 Encounters:  01/25/16 110/60  01/11/16 126/70  01/03/16 110/60    Past Medical History  Diagnosis Date  . Peripheral vascular disease (HCC)     Bilateral ABIs 0.8  . Chronic distal aortic occlusion (HCC)     By report  . Diabetes mellitus     By his report, this is prediabetes   . SOB (shortness of breath)   . Hyperlipidemia LDL goal <70   . Essential hypertension   . Weakness   . Erectile dysfunction   . Hypogonadism in male     Previously on testosterone injections, but has not yet restarted until further testing  . Enlarged prostate   . Arthritis   . Depression   . History of kidney stones   . Muscle pain   . CAD, multiple vessel 03/2105    LM 65%, mLAD 70%, 90% & 80%, o-pCx 75%, mCx 95% & d Cx 70%, OM1 60%, mRCA 90% with 100% dRCA. R-R collaterals  . S/P CABG x 5 04/04/2015    LIMA to LAD,  SVG to DIAG, SVG to OM, seqSVG -PDA- dCx  . PAF (paroxysmal atrial fibrillation) (HCC) 03/2015    Noted per-cath & post-op CABG.   Family History  Problem Relation Age of Onset  . Coronary artery disease Father   . Heart disease Father   . Stroke Mother   . Stroke Sister   . Prostate cancer Neg Hx   . Kidney disease Neg Hx   . Other Neg Hx     hypogonadism   Past Surgical History  Procedure Laterality Date  . Cardiac catheterization  1992  . Ventral hernia repair  1994    complicated by infection  . Cholecystectomy  2000    also had revision of hernia repair  . Cystectomy      face and scrotum  . Blepharoplasty    . Lexiscan myoview  June 2010    converted to [facility] from treadmill due to inability to reach target heart rate) --the study was normal with no evidence of ischemia and normal LV function. EF 7  . Cardiac catheterization N/A 03/31/2015    Procedure: Left Heart Cath and Coronary Angiography;  Surgeon: Marykay Lex, MD;  Location: [facility] INVASIVE CV LAB;  Service: Cardiovascular;  Laterality: N/A;  . Coronary artery bypass graft N/A 04/04/2015    Procedure: CORONARY ARTERY BYPASS GRAFTING (CABG);  Surgeon: [name], MD;  Location: [facility] OR;  Service: Open Heart Surgery;  Laterality: N/A;  . Tee without cardioversion N/A 04/04/2015    Procedure: TRANSESOPHAGEAL ECHOCARDIOGRAM (TEE);  Surgeon: Delight OvensEdward B Gerhardt, MD;  [facility];  Service: Open Heart Surgery;  Laterality: N/A;   Social History   Social History  . Marital Status: Married    Spouse Name: N/A  . Number of Children: N/A  . Years of Education: N/A   Social History Main Topics  . Smoking status: Former Smoker -- 2.00 packs/day for 40 years    Types: Cigarettes    Quit date: 10/17/1992  . Smokeless tobacco: Never Used     Comment: quit 1994  . Alcohol Use: 0.0 oz/week    0 Standard drinks or equivalent per week     Comment: seldom  . Drug Use: No  . Sexual Activity: No   Other Topics  Concern  . None   Social History Narrative   [place] in [place]. No pets   Two children.      Former smoker. Quit in 1994. Prior to quitting was unable to walk without exertional dyspnea.      Work - Retired. 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      Hobbies - reading, drives Corvette, chuch    Review of Systems  Constitutional: Positive for fatigue. Negative for fever, chills, activity change, appetite change and unexpected weight change.  Eyes: Negative for visual disturbance.  Respiratory: Positive for shortness of breath (with exertion at times). Negative for cough, chest tightness and wheezing.   Cardiovascular: Positive for chest pain (chest wall). Negative for palpitations and leg swelling.  Gastrointestinal: Negative for abdominal pain, diarrhea, constipation and abdominal distention.  Genitourinary: Negative for dysuria, urgency, hematuria, flank pain and difficulty urinating.  Musculoskeletal: Negative for arthralgias and gait problem.  Skin: Negative for color change and rash.  Hematological: Negative for adenopathy.  Psychiatric/Behavioral: Negative for sleep disturbance and dysphoric mood. The patient is not nervous/anxious.        Objective:    BP 110/60 mmHg  Pulse 74  Temp(Src) 97.6 F (36.4 C) (Oral)  Resp 17  Ht 5\' 9"  (1.753 m)  Wt 208 lb 6 oz (94.518 kg)  BMI 30.76 kg/m2  SpO2 96% Physical Exam  Constitutional: He is oriented to person, place, and time. He appears well-developed and well-nourished. No distress.  HENT:  Head: Normocephalic and atraumatic.  Right Ear: External ear normal.  Left Ear: External ear normal.  Nose: Nose normal.  Mouth/Throat: Oropharynx is clear and moist. No oropharyngeal exudate.  Eyes: Conjunctivae and EOM are normal. Pupils are equal, round, and reactive to light. Right eye exhibits no discharge. Left eye exhibits no discharge. No scleral icterus.    Neck: Normal range of motion. Neck supple. No tracheal deviation present. No thyromegaly present.  Cardiovascular: Normal rate, regular rhythm and normal heart sounds.  Exam reveals no gallop and no friction rub.   No murmur heard. Pulmonary/Chest: Effort normal and breath sounds normal. No accessory muscle usage. No tachypnea. No respiratory distress. He has no decreased breath sounds. He has no wheezes. He has no rhonchi. He has no rales. He exhibits no tenderness.  Musculoskeletal: Normal range of motion. He exhibits no edema.  Lymphadenopathy:    He has no cervical adenopathy.  Neurological: He is alert and oriented to person, place, and time. No cranial nerve deficit. Coordination normal.  Skin: Skin is warm  and dry. No rash noted. He is not diaphoretic. No erythema. No pallor.  Psychiatric: He has a normal mood and affect. His behavior is normal. Judgment and thought content normal.          Assessment & Plan:   Problem List Items Addressed This Visit      Unprioritized   Chest wall deformity - Primary    Reviewed recent CT chest in detail with pt and his wife. Encouraged use of ibuprofen as needed for chest wall pain at site of previous surgery.      COPD (chronic obstructive pulmonary disease) (HCC)    Reviewed CT findings of COPD. Will set up pulmonary function testing. He is a former smoker.      Relevant Orders   Ambulatory referral to Pulmonology   Nephrolithiasis    6mm right ureteral stone. Asymptomatic. Will set up urology evaluation.      Solitary pulmonary nodule    Plan for repeat CT chest in 1 year.      Thyroid nodule    Thyroid nodule noted on CT. Will set up thyroid US.      Relevant Orders   US Soft Tissue Head/Neck       Return in about 4 weeks (around 02/22/2016) for New Patient.  Ronna Polio, MD Internal Medicine [facility]

## 2016-01-25 NOTE — Assessment & Plan Note (Signed)
Thyroid nodule noted on CT. Will set up thyroid US.

## 2016-01-25 NOTE — Assessment & Plan Note (Signed)
6mm right ureteral stone. Asymptomatic. Will set up urology evaluation.

## 2016-01-26 ENCOUNTER — Encounter: Payer: Self-pay | Admitting: Cardiology

## 2016-01-27 ENCOUNTER — Encounter: Payer: Self-pay | Admitting: Internal Medicine

## 2016-01-29 ENCOUNTER — Encounter: Payer: Self-pay | Admitting: Internal Medicine

## 2016-02-02 ENCOUNTER — Encounter: Payer: Self-pay | Admitting: Cardiothoracic Surgery

## 2016-02-02 ENCOUNTER — Encounter: Payer: Self-pay | Admitting: Cardiology

## 2016-02-06 ENCOUNTER — Other Ambulatory Visit: Payer: Self-pay | Admitting: Internal Medicine

## 2016-02-06 DIAGNOSIS — N400 Enlarged prostate without lower urinary tract symptoms: Secondary | ICD-10-CM

## 2016-02-07 ENCOUNTER — Ambulatory Visit
Admission: RE | Admit: 2016-02-07 | Discharge: 2016-02-07 | Disposition: A | Discharge status: Home / Self Care | Payer: Medicare Other | Source: Ambulatory Visit | Attending: Internal Medicine | Admitting: Internal Medicine

## 2016-02-07 ENCOUNTER — Encounter: Payer: Self-pay | Admitting: Internal Medicine

## 2016-02-07 ENCOUNTER — Ambulatory Visit (INDEPENDENT_AMBULATORY_CARE_PROVIDER_SITE_OTHER): Payer: Medicare Other | Admitting: Internal Medicine

## 2016-02-07 VITALS — BP 124/66 | HR 78 | Ht 70.0 in | Wt 209.6 lb

## 2016-02-07 DIAGNOSIS — E041 Nontoxic single thyroid nodule: Secondary | ICD-10-CM | POA: Insufficient documentation

## 2016-02-07 DIAGNOSIS — J432 Centrilobular emphysema: Secondary | ICD-10-CM

## 2016-02-07 DIAGNOSIS — R911 Solitary pulmonary nodule: Secondary | ICD-10-CM

## 2016-02-07 DIAGNOSIS — I2510 Atherosclerotic heart disease of native coronary artery without angina pectoris: Secondary | ICD-10-CM | POA: Diagnosis not present

## 2016-02-07 DIAGNOSIS — E042 Nontoxic multinodular goiter: Secondary | ICD-10-CM | POA: Diagnosis not present

## 2016-02-07 DIAGNOSIS — R0600 Dyspnea, unspecified: Secondary | ICD-10-CM | POA: Insufficient documentation

## 2016-02-07 DIAGNOSIS — R0609 Other forms of dyspnea: Secondary | ICD-10-CM | POA: Insufficient documentation

## 2016-02-07 NOTE — Assessment & Plan Note (Signed)
Based on history and clinical symptoms long with CT results, patient has a moderate amount of emphysema. At this time given his sternotomy issues, cannot perform a full pulmonary function tests however spirometry will be adequate and will be performed prior to next visit. CT with diffuse centrilobular emphysema from apex to base of bilateral lungs.  Today we discussed emphysema, COPD, the use of inhalers, and will proceed with Breo.  Plan: -Breo 100 g-1 puff daily, gargle and rinse after each use, if there is improvement after 1 week, will fill a prescription. -Avoid any forms of tobacco -Exercise as tolerated  -in office spirometry prior to follow-up visit

## 2016-02-07 NOTE — Progress Notes (Signed)
[facility] Pulmonary Medicine Consultation    Date: 02/07/2016  MRN# [id number] [name] Nov 18, 1945  Referring Physician: Dr. Dan Humphreys PMD - Dr. Abundio Miu [name] is a 70 y.o. old male seen in consultation for dyspnea  CC:  Chief Complaint  Patient presents with  . pulmonary consult    per Dr.Walker. last CT 01/23/16. c/o sob & occ prod cough w/clear mucus.     HPI:  Patient is a pleasant 70 year old male past medical history of CABG status post four-vessel bypass, former tobacco user, chronic distal aortic occlusion, seen in consultation for abnormal CAT scan and dyspnea. Patient states that he's been having dyspnea for the past 1-2 years, got worse after his CABG about 9 months ago. Dyspnea has been gradual, states that walking on flat surfaces at a moderate rate does not induce dyspnea, however at any type of incline his dyspnea gets worse. Climbing one flight of stairs will induce dyspnea Previously smoked about 2 packs of cigarettes for 40 years quit in 1984. Denies any cough, sputum production, wheezing, fevers, weight loss. States that he had pneumonia at the age of 28 years, was hospitalized for about 4-5 days. Overall after his CABG, he noticed that his dyspnea did seem to get a little worse. Patient states that he still following up with his CT surgeon and his cardiologist. Given the level of his dyspnea, and swelling recently around his sternotomy site, a follow-up CT scan was ordered.  PMHX:   Past Medical History:  Diagnosis Date  . Arthritis   . CAD, multiple vessel 03/2105   LM 65%, mLAD 70%, 90% & 80%, o-pCx 75%, mCx 95% & d Cx 70%, OM1 60%, mRCA 90% with 100% dRCA. R-R collaterals  . Chronic distal aortic occlusion (HCC)    By report  . Depression   . Diabetes mellitus    By his report, this is prediabetes   . Enlarged prostate   . Erectile dysfunction   . Essential hypertension   . History of kidney stones   . Hyperlipidemia LDL goal <70   .  Hypogonadism in male    Previously on testosterone injections, but has not yet restarted until further testing  . Muscle pain   . PAF (paroxysmal atrial fibrillation) (HCC) 03/2015   Noted per-cath & post-op CABG.  . Peripheral vascular disease (HCC)    Bilateral ABIs 0.8  . S/P CABG x 5 04/04/2015   LIMA to LAD, SVG to DIAG, SVG to OM, seqSVG -PDA- dCx  . SOB (shortness of breath)   . Weakness    Surgical Hx:  Past Surgical History:  Procedure Laterality Date  . BLEPHAROPLASTY    . CARDIAC CATHETERIZATION  1992  . CARDIAC CATHETERIZATION N/A 03/31/2015   Procedure: Left Heart Cath and Coronary Angiography;  Surgeon: Marykay Lex, MD;  Location: [facility] INVASIVE CV LAB;  Service: Cardiovascular;  Laterality: N/A;  . CHOLECYSTECTOMY  2000   also had revision of hernia repair  . CORONARY ARTERY BYPASS GRAFT N/A 04/04/2015   Procedure: CORONARY ARTERY BYPASS GRAFTING (CABG);  Surgeon: [name], MD;  Location: [facility] OR;  Service: Open Heart Surgery;  Laterality: N/A;  . CYSTECTOMY     face and scrotum  . Lexiscan Myoview  June 2010   converted to [facility] from treadmill due to inability to reach target heart rate) --the study was normal with no evidence of ischemia and normal LV function. EF 7  . TEE WITHOUT CARDIOVERSION N/A 04/04/2015   Procedure: TRANSESOPHAGEAL  ECHOCARDIOGRAM (TEE);  Surgeon: [name], MD;  Location: [facility] OR;  Service: Open Heart Surgery;  Laterality: N/A;  . VENTRAL HERNIA REPAIR  1994   complicated by infection   Family Hx:  Family History  Problem Relation Age of Onset  . Coronary artery disease Father   . Heart disease Father   . Stroke Mother   . Stroke Sister   . Prostate cancer Neg Hx   . Kidney disease Neg Hx   . Other Neg Hx     hypogonadism   Social Hx:   Social History  Substance Use Topics  . Smoking status: Former Smoker    Packs/day: 2.00    Years: 40.00    Types: Cigarettes    Quit date: 10/17/1992  . Smokeless tobacco: Never  Used     Comment: quit 1994  . Alcohol use 0.0 oz/week     Comment: seldom   Medication:   Current Outpatient Rx  . Order #: [id number] Class: Historical Med  . Order #: [id number] Class: Normal  . Order #: [id number] Class: Normal  . Order #: [id number] Class: Historical Med  . Order #: [id number] Class: Normal  . Order #: [id number] Class: Normal  . Order #: [id number] Class: Normal  . Order #: [id number] Class: Historical Med  . Order #: [id number] Class: Historical Med      Allergies:  Review of patient's allergies indicates no known allergies.  Review of Systems  Constitutional: Negative for chills, fever and weight loss.  Eyes: Negative for blurred vision and double vision.  Respiratory: Positive for shortness of breath. Negative for cough, hemoptysis, sputum production and wheezing.   Gastrointestinal: Negative for heartburn.  Musculoskeletal: Negative for myalgias.  Skin: Negative for rash.  Neurological: Negative for dizziness and headaches.  Endo/Heme/Allergies: Does not bruise/bleed easily.  Psychiatric/Behavioral: Negative for depression.     Physical Examination:   VS: BP 124/66 (BP Location: Left Arm, Cuff Size: Normal)   Pulse 78   Ht 5\' 10"  (1.778 m)   Wt 209 lb 9.6 oz (95.1 kg)   SpO2 96%   BMI 30.07 kg/m   General Appearance: No distress  Neuro:without focal findings, mental status, speech normal, alert and oriented, cranial nerves 2-12 intact, reflexes normal and symmetric, sensation grossly normal  HEENT: PERRLA, EOM intact, no ptosis, no other lesions noticed; Mallampati2 Pulmonary: normal breath sounds., diaphragmatic excursion normal.No wheezing, No rales;   Sputum Production:  none CardiovascularNormal S1,S2.  No m/r/g.  Abdominal aorta pulsation normal.    Abdomen: Benign, Soft, non-tender, No masses, hepatosplenomegaly, No lymphadenopathy Renal:  No costovertebral tenderness  GU:  No performed at this time. Endoc: No evident thyromegaly, no signs of  acromegaly or Cushing features Skin:   warm, no rashes, no ecchymosis  Extremities: normal, no cyanosis, clubbing, no edema, warm with normal capillary refill. Other findings:none   Rad results: (The following images and results were reviewed by Dr. Dema Severin on 02/07/2016). CT Chest 01/23/2016 EXAM: CT CHEST WITH CONTRAST  TECHNIQUE: Multidetector CT imaging of the chest was performed during intravenous contrast administration.  CONTRAST:  75 cc Isovue 370 intravenously.  COMPARISON:  Chest radiograph 05/16/2015  FINDINGS: Mediastinum/Lymph Nodes: The thyroid gland is somewhat heterogeneous in appearance with subtle 6 mm hypoattenuated nodule in the right thyroid lobe. The heart is mildly enlarged. Heavy calcified atherosclerotic disease of the coronary arteries and the visualized portion of the aorta are noted. No masses, pathologically enlarged lymph nodes, or other significant abnormality. Patient is status post CABG. There is a chronic nonunion of  the sternum at the median sternotomy without evidence of erosive bony changes or focal fluid collection at the surgical line. The left sternoclavicular joint is widened to 10 mm, for comparison the right sternoclavicular joint measures 5 mm. Foci of gas also noted within the left sternoclavicular joint. There are no associated osteolytic changes.  Lungs/Pleura: There is moderate to severe upper lobe predominant centrilobular emphysema. There is a 4 mm perifissural ground-glass nodule in the right middle lobe.  Upper abdomen: There is a partially visualized 6 mm stone within the proximal right ureter causing mild hydroureter upstream without significant hydronephrosis. Bilateral nephrolithiasis is noted. There are sub cm left renal cysts.  Musculoskeletal: No chest wall mass or suspicious bone lesions identified.  IMPRESSION: Widening of the left sternoclavicular joint, which contains foci of gas, without associated  osteolytic changes. This may represent osteoarthritis with prominent vacuum phenomenon, however discitis may have a similar appearance.  Status post CABG with chronic nonunion of the sternum at the median sternotomy line, without evidence of erosive bony changes or wall abscess formation.  Enlarged heart. Severe coronary artery and aortic calcific atherosclerotic disease.  Moderate to severe emphysema, with 4 mm nonspecific ground-glass nodule in the right middle lobe. No follow-up needed if patient is low-risk. Non-contrast chest CT can be considered in 12 months if patient is high-risk. This recommendation follows the consensus statement: Guidelines for Management of Incidental Pulmonary Nodules Detected on CT Images:From the Fleischner Society 2017; published online before print (10.1148/radiol.1610960454).  Partially visualized 6 mm stone within the proximal right ureter, causing mild right hydroureter, which may be partially obstructing.  Bilateral nephrolithiasis.  These results will be called to the ordering clinician or representative by the Radiologist Assistant, and communication documented in the PACS or zVision Dashboard.    EKG:     Other:   Assessment and Plan: COPD (chronic obstructive pulmonary disease) (HCC) Based on history and clinical symptoms long with CT results, patient has a moderate amount of emphysema. At this time given his sternotomy issues, cannot perform a full pulmonary function tests however spirometry will be adequate and will be performed prior to next visit. CT with diffuse centrilobular emphysema from apex to base of bilateral lungs.  Today we discussed emphysema, COPD, the use of inhalers, and will proceed with Breo.  Plan: -Breo 100 g-1 puff daily, gargle and rinse after each use, if there is improvement after 1 week, will fill a prescription. -Avoid any forms of tobacco -Exercise as tolerated  -in office spirometry prior to  follow-up visit  Solitary pulmonary nodule 4 mm fissure nodule in the right middle lobe Low risk factors for malignancy Repeat CAT scan in one year  Dyspnea Multifactorial: Emphysema, obesity, deconditioning,  Optimize emphysema regiment-given a trial of Breo Exercise as tolerated   Updated Medication List Outpatient Encounter Prescriptions as of 02/07/2016  Medication Sig  . acetaminophen (TYLENOL) 650 MG CR tablet Take 650 mg by mouth every 8 (eight) hours as needed. Reported on 01/03/2016  . atorvastatin (LIPITOR) 40 MG tablet Take 1 tablet (40 mg total) by mouth daily.  . clopidogrel (PLAVIX) 75 MG tablet Take 1 tablet (75 mg total) by mouth daily.  . fexofenadine (ALLEGRA) 180 MG tablet Take 180 mg by mouth daily. Reported on 01/03/2016  . finasteride (PROSCAR) 5 MG tablet TAKE 1 TABLET EVERY DAY  . gabapentin (NEURONTIN) 400 MG capsule Take 1 capsule (400 mg total) by mouth 3 (three) times daily.  . metoprolol tartrate (LOPRESSOR) 25 MG tablet Take  0.5 tablets (12.5 mg total) by mouth 2 (two) times daily. (Patient taking differently: Take 12.5 mg by mouth daily. )  . Multiple Vitamin (MULTIVITAMIN) tablet Take 1 tablet by mouth daily. Reported on 01/03/2016  . NITROSTAT 0.4 MG SL tablet Place 0.4 mg under the tongue every 5 (five) minutes x 3 doses as needed. Reported on 01/03/2016   No facility-administered encounter medications on file as of 02/07/2016.     Orders for this visit: No orders of the defined types were placed in this encounter.    Thank  you for the consultation and for allowing [facility] to assist in the care of your patient. Our recommendations are noted above.  Please contact us if we can be of further service.   Stephanie Acre, MD [facility] Office Number: [phone]  Note: This note was prepared with Dragon dictation along with smaller phrase technology. Any transcriptional errors that result from this  process are unintentional.

## 2016-02-07 NOTE — Assessment & Plan Note (Signed)
4 mm fissure nodule in the right middle lobe Low risk factors for malignancy Repeat CAT scan in one year

## 2016-02-07 NOTE — Assessment & Plan Note (Signed)
Multifactorial: Emphysema, obesity, deconditioning,  Optimize emphysema regiment-given a trial of Breo Exercise as tolerated

## 2016-02-07 NOTE — Patient Instructions (Addendum)
Follow up with Dr. Dema Severin in:2 months - inoffice spirometry prior to follow up visit - BreoEllipta - - 1 puff daily, -gargle and rinse after each use. Call us back for a prescription if improvement after 7-10 days of use. We can give you another sample if needed at that time until your rx is delivered.  - exercise as tolerated - of walking at least 3 times per week.  - avoid any form of tobacco

## 2016-02-10 ENCOUNTER — Ambulatory Visit: Payer: Medicare Other | Admitting: Internal Medicine

## 2016-02-10 ENCOUNTER — Ambulatory Visit (INDEPENDENT_AMBULATORY_CARE_PROVIDER_SITE_OTHER): Payer: Medicare Other | Admitting: Internal Medicine

## 2016-02-10 ENCOUNTER — Encounter: Payer: Self-pay | Admitting: Internal Medicine

## 2016-02-10 VITALS — BP 116/60 | HR 78 | Ht 69.0 in | Wt 213.0 lb

## 2016-02-10 DIAGNOSIS — J432 Centrilobular emphysema: Secondary | ICD-10-CM

## 2016-02-10 DIAGNOSIS — R0600 Dyspnea, unspecified: Secondary | ICD-10-CM | POA: Diagnosis not present

## 2016-02-10 DIAGNOSIS — I2510 Atherosclerotic heart disease of native coronary artery without angina pectoris: Secondary | ICD-10-CM

## 2016-02-10 DIAGNOSIS — E041 Nontoxic single thyroid nodule: Secondary | ICD-10-CM

## 2016-02-10 DIAGNOSIS — R911 Solitary pulmonary nodule: Secondary | ICD-10-CM | POA: Diagnosis not present

## 2016-02-10 DIAGNOSIS — M954 Acquired deformity of chest and rib: Secondary | ICD-10-CM

## 2016-02-10 NOTE — Assessment & Plan Note (Signed)
Plan for repeat CT chest in 01/2017

## 2016-02-10 NOTE — Progress Notes (Signed)
Subjective:    Patient ID: [name], male    DOB: 1946-07-07, 70 y.o.   MRN: [id number]  HPI  70YO male presents for follow up.  Recently seen by pulmonary. Started on [place] for COPD.  No change noted in dyspnea so far with [facility], however has only been taking medication for 3 days. No cough.  Continues to have numbness in left lateral chest wall. Planning to see CT surgeon on Wednesday to discuss failure of sternum to fuse.  Recently had thyroid US which showed multinodular goiter. Recent TSH in 11/2015 was normal.  Wt Readings from Last 3 Encounters:  02/10/16 213 lb (96.6 kg)  02/07/16 209 lb 9.6 oz (95.1 kg)  01/25/16 208 lb 6 oz (94.5 kg)   BP Readings from Last 3 Encounters:  02/10/16 116/60  02/07/16 124/66  01/25/16 110/60    Past Medical History:  Diagnosis Date  . Arthritis   . CAD, multiple vessel 03/2105   LM 65%, mLAD 70%, 90% & 80%, o-pCx 75%, mCx 95% & d Cx 70%, OM1 60%, mRCA 90% with 100% dRCA. R-R collaterals  . Chronic distal aortic occlusion (HCC)    By report  . Depression   . Diabetes mellitus    By his report, this is prediabetes   . Enlarged prostate   . Erectile dysfunction   . Essential hypertension   . History of kidney stones   . Hyperlipidemia LDL goal <70   . Hypogonadism in male    Previously on testosterone injections, but has not yet restarted until further testing  . Muscle pain   . PAF (paroxysmal atrial fibrillation) (HCC) 03/2015   Noted per-cath & post-op CABG.  . Peripheral vascular disease (HCC)    Bilateral ABIs 0.8  . S/P CABG x 5 04/04/2015   LIMA to LAD, SVG to DIAG, SVG to OM, seqSVG -PDA- dCx  . SOB (shortness of breath)   . Weakness    Family History  Problem Relation Age of Onset  . Coronary artery disease Father   . Heart disease Father   . Stroke Mother   . Stroke Sister   . Prostate cancer Neg Hx   . Kidney disease Neg Hx   . Other Neg Hx     hypogonadism   Past Surgical History:  Procedure Laterality  Date  . BLEPHAROPLASTY    . CARDIAC CATHETERIZATION  1992  . CARDIAC CATHETERIZATION N/A 03/31/2015   Procedure: Left Heart Cath and Coronary Angiography;  Surgeon: Marykay Lex, MD;  Location: [facility] INVASIVE CV LAB;  Service: Cardiovascular;  Laterality: N/A;  . CHOLECYSTECTOMY  2000   also had revision of hernia repair  . CORONARY ARTERY BYPASS GRAFT N/A 04/04/2015   Procedure: CORONARY ARTERY BYPASS GRAFTING (CABG);  Surgeon: [name], MD;  Location: [facility] OR;  Service: Open Heart Surgery;  Laterality: N/A;  . CYSTECTOMY     face and scrotum  . Lexiscan Myoview  June 2010   converted to [facility] from treadmill due to inability to reach target heart rate) --the study was normal with no evidence of ischemia and normal LV function. EF 7  . TEE WITHOUT CARDIOVERSION N/A 04/04/2015   Procedure: TRANSESOPHAGEAL ECHOCARDIOGRAM (TEE);  Surgeon: [name], MD;  Location: [facility] OR;  Service: Open Heart Surgery;  Laterality: N/A;  . VENTRAL HERNIA REPAIR  1994   complicated by infection   Social History   Social History  . Marital status: Married    Spouse name:  N/A  . Number of children: N/A  . Years of education: N/A   Social History Main Topics  . Smoking status: Former Smoker    Packs/day: 2.00    Years: 40.00    Types: Cigarettes    Quit date: 10/17/1992  . Smokeless tobacco: Never Used     Comment: quit 1994  . Alcohol use 0.0 oz/week     Comment: seldom  . Drug use: No  . Sexual activity: No   Other Topics Concern  . None   Social History Narrative   Lives in [place] with wife. No pets   Two children.      Former smoker. Quit in 1994. Prior to quitting was unable to walk without exertional dyspnea.      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      Hobbies - reading, drives Corvette, chuch    Review of Systems  Constitutional: Negative for activity change, appetite change, chills,  fatigue, fever and unexpected weight change.  Eyes: Negative for visual disturbance.  Respiratory: Positive for shortness of breath. Negative for cough and wheezing.   Cardiovascular: Negative for chest pain, palpitations and leg swelling.  Gastrointestinal: Negative for abdominal distention, abdominal pain, constipation, diarrhea, nausea and vomiting.  Genitourinary: Negative for decreased urine volume, difficulty urinating, dysuria and urgency.  Musculoskeletal: Negative for arthralgias and gait problem.  Skin: Negative for color change and rash.  Hematological: Negative for adenopathy.  Psychiatric/Behavioral: Negative for dysphoric mood and sleep disturbance. The patient is not nervous/anxious.        Objective:    BP 116/60 (BP Location: Right Arm, Patient Position: Sitting, Cuff Size: Large)   Pulse 78   Ht  (1.753 m)   Wt 213 lb (96.6 kg)   SpO2 96%   BMI 31.45 kg/m  Physical Exam  Constitutional: He is oriented to person, place, and time. He appears well-developed and well-nourished. No distress.  HENT:  Head: Normocephalic and atraumatic.  Right Ear: External ear normal.  Left Ear: External ear normal.  Nose: Nose normal.  Mouth/Throat: Oropharynx is clear and moist. No oropharyngeal exudate.  Eyes: Conjunctivae and EOM are normal. Pupils are equal, round, and reactive to light. Right eye exhibits no discharge. Left eye exhibits no discharge. No scleral icterus.  Neck: Normal range of motion. Neck supple. No tracheal deviation present. No thyromegaly present.  Cardiovascular: Normal rate, regular rhythm and normal heart sounds.  Exam reveals no gallop and no friction rub.   No murmur heard. Pulmonary/Chest: Effort normal and breath sounds normal. No accessory muscle usage. No tachypnea. No respiratory distress. He has no decreased breath sounds. He has no wheezes. He has no rhonchi. He has no rales. He exhibits no tenderness.  Musculoskeletal: Normal range of motion.  He exhibits no edema.  Lymphadenopathy:    He has no cervical adenopathy.  Neurological: He is alert and oriented to person, place, and time. No cranial nerve deficit. Coordination normal.  Skin: Skin is warm and dry. No rash noted. He is not diaphoretic. No erythema. No pallor.  Psychiatric: He has a normal mood and affect. His behavior is normal. Judgment and thought content normal.          Assessment & Plan:   Problem List Items Addressed This Visit      Unprioritized   Chest wall deformity    He is planning to follow up with CT surgery to  discuss concerns regarding numbness after CABG and failure of sternum to fuse. Tried to offer reassurance.      COPD (chronic obstructive pulmonary disease) (HCC) - Primary    Reviewed notes from Dr. Dema Severin. Will continue Breo. Follow up with Dr. Dema Severin as scheduled.      Dyspnea    Reviewed notes from Dr. Dema Severin. Started on [place]. Will monitor.       Solitary pulmonary nodule    Plan for repeat CT chest in 01/2017      Thyroid nodule    US thyroid was c/w multinodular goiter. Recent TSH was normal. Will continue to monitor thyroid function.       Other Visit Diagnoses   None.      Return in about 4 weeks (around 03/09/2016) for New Patient.  Ronna Polio, MD Internal Medicine [facility]

## 2016-02-10 NOTE — Assessment & Plan Note (Signed)
He is planning to follow up with CT surgery to discuss concerns regarding numbness after CABG and failure of sternum to fuse. Tried to offer reassurance.

## 2016-02-10 NOTE — Assessment & Plan Note (Signed)
Reviewed notes from Dr. Dema Severin. Started on [place]. Will monitor.

## 2016-02-10 NOTE — Assessment & Plan Note (Signed)
US thyroid was c/w multinodular goiter. Recent TSH was normal. Will continue to monitor thyroid function.

## 2016-02-10 NOTE — Assessment & Plan Note (Signed)
Reviewed notes from Dr. Dema Severin. Will continue [place]. Follow up with Dr. Dema Severin as scheduled.

## 2016-02-10 NOTE — Progress Notes (Signed)
Pre visit review using our clinic review tool, if applicable. No additional management support is needed unless otherwise documented below in the visit note. 

## 2016-02-14 ENCOUNTER — Telehealth: Payer: Self-pay | Admitting: Internal Medicine

## 2016-02-14 DIAGNOSIS — N202 Calculus of kidney with calculus of ureter: Secondary | ICD-10-CM | POA: Diagnosis not present

## 2016-02-14 DIAGNOSIS — N401 Enlarged prostate with lower urinary tract symptoms: Secondary | ICD-10-CM | POA: Diagnosis not present

## 2016-02-14 DIAGNOSIS — R351 Nocturia: Secondary | ICD-10-CM | POA: Diagnosis not present

## 2016-02-14 MED ORDER — FLUTICASONE FUROATE-VILANTEROL 100-25 MCG/INH IN AEPB: 1.0000 | INHALATION_SPRAY | Freq: Every day | RESPIRATORY_TRACT | 3 refills | Status: DC

## 2016-02-14 NOTE — Telephone Encounter (Signed)
Pt calling asking if we can call him back For we placed him on brio for 7 days He's doing well, no reaction Would like [place] to place a  prescription with [facility] pharmacy

## 2016-02-14 NOTE — Telephone Encounter (Signed)
Pt informed Virgel Bouquet has been sent to mail order and if sample needed until received he can come by and pick a sample up. Nothing further needed.

## 2016-02-15 ENCOUNTER — Ambulatory Visit (INDEPENDENT_AMBULATORY_CARE_PROVIDER_SITE_OTHER): Payer: Medicare Other | Admitting: Cardiothoracic Surgery

## 2016-02-15 ENCOUNTER — Encounter: Payer: Self-pay | Admitting: Cardiothoracic Surgery

## 2016-02-15 VITALS — BP 94/57 | HR 83 | Resp 16 | Ht 69.0 in | Wt 213.0 lb

## 2016-02-15 DIAGNOSIS — I2510 Atherosclerotic heart disease of native coronary artery without angina pectoris: Secondary | ICD-10-CM

## 2016-02-15 DIAGNOSIS — M9689 Other intraoperative and postprocedural complications and disorders of the musculoskeletal system: Secondary | ICD-10-CM | POA: Diagnosis not present

## 2016-02-15 DIAGNOSIS — Z951 Presence of aortocoronary bypass graft: Secondary | ICD-10-CM | POA: Diagnosis not present

## 2016-02-15 NOTE — Progress Notes (Signed)
[address]       [place] [address]             [phone]      Johnaton Sonneborn [facility] Medical Record #[id number] Date of Birth: April 22, 1946  Referring: Marykay Lex, MD Primary Care: [name], MD  Chief Complaint:   POST OP FOLLOW UP SURGICAL PROCEDURE: Coronary artery bypass grafting x5 with the left internal mammary to the left anterior descending coronary artery, reverse saphenous vein graft to the diagonal coronary artery, reverse saphenous vein graft to intramyocardial obtuse marginal, and sequential reverse saphenous vein graft to the posterior descending and distal circumflex with right greater saphenous endovein harvesting thigh and calf, and left thigh greater saphenous vein endoscopic harvest.  History of Present Illness:     Patient has had some vague chest discomfort, a recent CT scan of the chest confirmed a nonunion of sternum. From a cardiac standpoint the patient has done well since this bypass surgery, returned to near-normal activities. He has noted shortness of breath, his preop PFTs showed an FEV1 of 60% of normal.  Currently he has very vague discomfort from the nonunion, when questioned wasn't hurting enough for repeat operation he was not interested.    Past Medical History:  Diagnosis Date  . Arthritis   . CAD, multiple vessel 03/2105   LM 65%, mLAD 70%, 90% & 80%, o-pCx 75%, mCx 95% & d Cx 70%, OM1 60%, mRCA 90% with 100% dRCA. R-R collaterals  . Chronic distal aortic occlusion (HCC)    By report  . Depression   . Diabetes mellitus    By his report, this is prediabetes   . Enlarged prostate   . Erectile dysfunction   . Essential hypertension   . History of kidney stones   . Hyperlipidemia LDL goal <70   . Hypogonadism in male    Previously on testosterone injections, but has not yet restarted until further testing  . Muscle pain   . PAF (paroxysmal atrial fibrillation) (HCC) 03/2015   Noted per-cath &  post-op CABG.  . Peripheral vascular disease (HCC)    Bilateral ABIs 0.8  . S/P CABG x 5 04/04/2015   LIMA to LAD, SVG to DIAG, SVG to OM, seqSVG -PDA- dCx  . SOB (shortness of breath)   . Weakness      History  Smoking Status  . Former Smoker  . Packs/day: 2.00  . Years: 40.00  . Types: Cigarettes  . Quit date: 10/17/1992  Smokeless Tobacco  . Never Used    Comment: quit 1994    History  Alcohol Use  . 0.0 oz/week    Comment: seldom     No Known Allergies  Current Outpatient Prescriptions  Medication Sig Dispense Refill  . acetaminophen (TYLENOL) 650 MG CR tablet Take 650 mg by mouth every 8 (eight) hours as needed. Reported on 01/03/2016    . atorvastatin (LIPITOR) 40 MG tablet Take 1 tablet (40 mg total) by mouth daily. 90 tablet 3  . clopidogrel (PLAVIX) 75 MG tablet Take 1 tablet (75 mg total) by mouth daily. 90 tablet 3  . fexofenadine (ALLEGRA) 180 MG tablet Take 180 mg by mouth daily as needed. Reported on 01/03/2016    . finasteride (PROSCAR) 5 MG tablet TAKE 1 TABLET EVERY DAY 90 tablet 0  . fluticasone furoate-vilanterol (BREO ELLIPTA) 100-25 MCG/INH AEPB Inhale 1 puff into the lungs daily. 180 each 3  . gabapentin (NEURONTIN) 400 MG  capsule Take 400 mg by mouth daily.    . metoprolol tartrate (LOPRESSOR) 25 MG tablet Take 0.5 tablets (12.5 mg total) by mouth 2 (two) times daily. 90 tablet 3  . Multiple Vitamin (MULTIVITAMIN) tablet Take 1 tablet by mouth daily as needed. Reported on 01/03/2016    . NITROSTAT 0.4 MG SL tablet Place 0.4 mg under the tongue every 5 (five) minutes x 3 doses as needed. Reported on 01/03/2016     No current facility-administered medications for this visit.        Physical Exam: BP (!) 94/57   Pulse 83   Resp 16   Ht 5\' 9"  (1.753 m)   Wt 213 lb (96.6 kg)   SpO2 95% Comment: ON RA  BMI 31.45 kg/m   General appearance: alert, cooperative and no distress Neurologic: intact Heart: regular rate and rhythm, S1, S2 normal, no  murmur, click, rub or gallop Lungs: clear to auscultation bilaterally Abdomen: soft, non-tender; bowel sounds normal; no masses,  no organomegaly Extremities: extremities normal, atraumatic, no cyanosis or edema Wound: sternal incision, intact, vey slight sternal movement.   Diagnostic Studies & Laboratory data:     Recent Radiology Findings:  Ct Chest W Contrast  Result Date: 01/23/2016 CLINICAL DATA:  Sternal pain for 1 month. Occasional dyspnea on exertion. EXAM: CT CHEST WITH CONTRAST TECHNIQUE: Multidetector CT imaging of the chest was performed during intravenous contrast administration. CONTRAST:  75 cc Isovue 370 intravenously. COMPARISON:  Chest radiograph 05/16/2015 FINDINGS: Mediastinum/Lymph Nodes: The thyroid gland is somewhat heterogeneous in appearance with subtle 6 mm hypoattenuated nodule in the right thyroid lobe. The heart is mildly enlarged. Heavy calcified atherosclerotic disease of the coronary arteries and the visualized portion of the aorta are noted. No masses, pathologically enlarged lymph nodes, or other significant abnormality. Patient is status post CABG. There is a chronic nonunion of the sternum at the median sternotomy without evidence of erosive bony changes or focal fluid collection at the surgical line. The left sternoclavicular joint is widened to 10 mm, for comparison the right sternoclavicular joint measures 5 mm. Foci of gas also noted within the left sternoclavicular joint. There are no associated osteolytic changes. Lungs/Pleura: There is moderate to severe upper lobe predominant centrilobular emphysema. There is a 4 mm perifissural ground-glass nodule in the right middle lobe. Upper abdomen: There is a partially visualized 6 mm stone within the proximal right ureter causing mild hydroureter upstream without significant hydronephrosis. Bilateral nephrolithiasis is noted. There are sub cm left renal cysts. Musculoskeletal: No chest wall mass or suspicious bone  lesions identified. IMPRESSION: Widening of the left sternoclavicular joint, which contains foci of gas, without associated osteolytic changes. This may represent osteoarthritis with prominent vacuum phenomenon, however discitis may have a similar appearance. Status post CABG with chronic nonunion of the sternum at the median sternotomy line, without evidence of erosive bony changes or wall abscess formation. Enlarged heart. Severe coronary artery and aortic calcific atherosclerotic disease. Moderate to severe emphysema, with 4 mm nonspecific ground-glass nodule in the right middle lobe. No follow-up needed if patient is low-risk. Non-contrast chest CT can be considered in 12 months if patient is high-risk. This recommendation follows the consensus statement: Guidelines for Management of Incidental Pulmonary Nodules Detected on CT Images:From the Fleischner Society 2017; published online before print (10.1148/radiol.5277824235). Partially visualized 6 mm stone within the proximal right ureter, causing mild right hydroureter, which may be partially obstructing. Bilateral nephrolithiasis. These results will be called to the ordering clinician or representative  by the Radiologist Assistant, and communication documented in the PACS or zVision Dashboard. Electronically Signed   By: Ted Mcalpine M.D.   On: 01/23/2016 15:59   US Thyroid  Result Date: 02/07/2016 CLINICAL DATA:  Thyroid nodules seen on CT scan EXAM: THYROID ULTRASOUND TECHNIQUE: Ultrasound examination of the thyroid gland and adjacent soft tissues was performed. COMPARISON:  Chest CT - 01/23/2016 FINDINGS: There is mild diffuse heterogeneity of thyroid parenchymal echotexture. Right thyroid lobe Measurements: Normal in size measuring 5.1 x 1.7 x 2.0 cm. Right, mid - 0.7 cm - mixed echogenic, solid - this nodule likely correlates with the nodule seen on preceding chest CT. Left thyroid lobe Measurements: Normal in size measuring 5.1 x 1.7 x 1.9 cm.  Left, mid, anterior - 0.7 cm - mixed echogenic, solid Left, inferior - 0.6 cm - mixed echogenic, solid Isthmus Thickness: Normal in size measures 0.3 cm in diameter No discrete nodule or mass identified within the thyroid isthmus. Lymphadenopathy None visualized. IMPRESSION: Findings suggestive of multi nodular goiter. None of the discretely measured up punctate (sub cm) nodules currently meet imaging criteria to recommend percutaneous sampling. This recommendation follows the consensus statement: Management of Thyroid Nodules Detected at [place]: Society of Radiologists in Ultrasound Consensus Conference Statement. Radiology 2005; [id number]. Electronically Signed   By: Simonne Come M.D.   On: 02/07/2016 23:40    Recent Lab Findings: Lab Results  Component Value Date   WBC 7.4 11/18/2015   HGB 13.8 11/18/2015   HCT 40.1 11/18/2015   PLT 176 11/18/2015   GLUCOSE 176 (H) 01/11/2016   CHOL 126 06/29/2015   TRIG 191.0 (H) 06/29/2015   HDL 34.30 (L) 06/29/2015   LDLCALC 53 06/29/2015   ALT 22 01/11/2016   AST 18 01/11/2016   NA 139 01/11/2016   K 4.0 01/11/2016   CL 105 01/11/2016   CREATININE 1.13 01/11/2016   BUN 28 (H) 01/11/2016   CO2 28 01/11/2016   TSH 2.32 11/18/2015   INR 1.52 (H) 04/04/2015   HGBA1C 5.9 12/23/2015   PFT's: pre op FEV1 1.92 61% Interpretation: The FVC, FEV1, FEV1/FVC ratio and FEF25-75% are reduced indicating airway obstruction. The slow vital capacity is reduced. Conclusions: Pulmonary Function Diagnosis: Moderate Obstructive Airways Disease  Assessment / Plan:      Status post coronary artery bypass grafting, now with non-union of the sternum but without major stability Moderate to severe emphysema on ct scan/  Moderate Obstructive Airways Disease BY PFT'S 4 mm nonspecific ground-glass nodule in the right middle lobe- nonspecific  I discussed with the patient and his wife the CT findings of nonunion of the sternum following coronary artery bypass grafting  without major instability or fractured wires. The current nonunion should not interfere with obtaining pulmonary function studies or any treatment of his kidney stones, which are his 2 major concerns. I discussed with him rewiring/ plating of the sternum, Especially if he has increasing discomfort , currently is not interested in any reoperation on the sternum , if he has increasing discomfort he will call otherwise I will see him back in 6 months .  [name] MD      [address] [employer] [employer] [place] [address] Office [phone]   Beeper [phone]  02/15/2016 10:22 PM

## 2016-02-16 ENCOUNTER — Telehealth: Payer: Self-pay | Admitting: Internal Medicine

## 2016-02-16 MED ORDER — FLUTICASONE-SALMETEROL 250-50 MCG/DOSE IN AEPB: 1.0000 | INHALATION_SPRAY | Freq: Two times a day (BID) | RESPIRATORY_TRACT | 3 refills | Status: DC

## 2016-02-16 NOTE — Telephone Encounter (Signed)
Advair 250/50- 1 puff BID  Thanks VM

## 2016-02-16 NOTE — Telephone Encounter (Signed)
Pt states he is ready to schedule his breathing test . States he had a breathing test on 04-01-2015. States his insurance company will not cover the Brio, and would like to know another recommendation.

## 2016-02-16 NOTE — Telephone Encounter (Signed)
Pt aware that breo will be changed to advair due to his high co-pay. He also states that his cardiology said it was okay for him to preform the spirometry that VM ordered at his last OV but advised him to stop the test if he developed any pain. rx sent to preferred pharmacy. Pt aware & voiced understanding. Nothing further needed.

## 2016-02-16 NOTE — Telephone Encounter (Signed)
Please advise on inhaler since insurance won't cover the [facility].

## 2016-02-23 ENCOUNTER — Ambulatory Visit (INDEPENDENT_AMBULATORY_CARE_PROVIDER_SITE_OTHER): Payer: Medicare Other | Admitting: Family Medicine

## 2016-02-23 ENCOUNTER — Encounter: Payer: Self-pay | Admitting: Family Medicine

## 2016-02-23 DIAGNOSIS — E042 Nontoxic multinodular goiter: Secondary | ICD-10-CM | POA: Diagnosis not present

## 2016-02-23 DIAGNOSIS — N200 Calculus of kidney: Secondary | ICD-10-CM | POA: Diagnosis not present

## 2016-02-23 DIAGNOSIS — I2510 Atherosclerotic heart disease of native coronary artery without angina pectoris: Secondary | ICD-10-CM

## 2016-02-23 DIAGNOSIS — M954 Acquired deformity of chest and rib: Secondary | ICD-10-CM | POA: Diagnosis not present

## 2016-02-23 NOTE — Assessment & Plan Note (Addendum)
We'll periodically monitor thyroid function. We will continue to monitor.

## 2016-02-23 NOTE — Progress Notes (Signed)
  Marikay AlarEric Jezreel Sisk, MD Phone: [phone]  Essie HartFrank Gebbia is a 70 y.o. male who presents today for follow-up.  Thyroid nodule: Found on CT scan. Had ultrasound revealing multinodular goiter with no nodules meeting size criteria for biopsy.  Nephrolithiasis: Patient notes he has a kidney stone that measures 0.6 cm. He is going to have lithotripsy and was recently cleared by his heart physician to undergo lithotripsy per his report. He is awaiting the urologist. No pain at this time. No pain with urination.  Patient has had malunion of his sternum from prior CABG. It appears per the CT surgeons note that they discussed rewiring/plating of the sternum though patient was currently not interested in reoperation at this time. Patient is going to continue to monitor this and if he has discomfort he will let us urgent know. He does report some numbness at the site of the sternotomy though no chest pressure. Has not had any chest pain. Has not had to use his nitroglycerin.  PMH: Former smoker.   ROS see history of present illness  Objective  Physical Exam Vitals:   02/23/16 1433  BP: 114/62  Pulse: 84  Resp: 16  Temp: 98.1 F (36.7 C)    BP Readings from Last 3 Encounters:  02/23/16 114/62  02/15/16 (!) 94/57  02/10/16 116/60   Wt Readings from Last 3 Encounters:  02/23/16 210 lb (95.3 kg)  02/15/16 213 lb (96.6 kg)  02/10/16 213 lb (96.6 kg)    Physical Exam  Constitutional: No distress.  HENT:  Head: Normocephalic and atraumatic.  Neck: Neck supple. No thyromegaly present.  Cardiovascular: Normal rate, regular rhythm and normal heart sounds.   Pulmonary/Chest: Effort normal and breath sounds normal. He exhibits no tenderness.  Musculoskeletal: He exhibits no edema.  Lymphadenopathy:    He has no cervical adenopathy.  Neurological: He is alert. Gait normal.  Skin: Skin is warm and dry. He is not diaphoretic.     Assessment/Plan: Please see individual problem  list.  Multinodular goiter We'll periodically monitor thyroid function. We will continue to monitor.  Chest wall deformity Had malunion of his sternum following CABG. Some mild numbness with this. No pain at this time. Discussed continuing to monitor and if has pain following up with his surgeon.  Nephrolithiasis Patient is working with urology to get lithotripsy. Encouraged him to follow-up with them.   Marikay AlarEric Omarii Scalzo, MD [facility]

## 2016-02-23 NOTE — Patient Instructions (Signed)
Nice to meet you. Please keep your appointment with a urologist regarding your kidney stone. Please continue to monitor your sternum and if you develop pain or any other issues with that please let us in your surgeon. Please check into the cost of a pneumonia vaccine and tetanus vaccination specifically Tdap with your insurance company. These may be cheaper or free if you have the med a pharmacy or the health department. If you develop chest pain, shortness of breath, or any new or change in symptoms please seek medical attention.

## 2016-02-23 NOTE — Assessment & Plan Note (Signed)
Had malunion of his sternum following CABG. Some mild numbness with this. No pain at this time. Discussed continuing to monitor and if has pain following up with his surgeon.

## 2016-02-23 NOTE — Assessment & Plan Note (Signed)
Patient is working with urology to get lithotripsy. Encouraged him to follow-up with them.

## 2016-02-23 NOTE — Progress Notes (Signed)
Pre visit review using our clinic review tool, if applicable. No additional management support is needed unless otherwise documented below in the visit note. 

## 2016-02-27 ENCOUNTER — Other Ambulatory Visit: Payer: Self-pay | Admitting: Internal Medicine

## 2016-02-27 DIAGNOSIS — N400 Enlarged prostate without lower urinary tract symptoms: Secondary | ICD-10-CM

## 2016-02-27 MED ORDER — FINASTERIDE 5 MG PO TABS: 5.0000 mg | ORAL_TABLET | Freq: Every day | ORAL | 0 refills | Status: DC

## 2016-02-28 ENCOUNTER — Encounter: Payer: Self-pay | Admitting: Cardiothoracic Surgery

## 2016-02-29 ENCOUNTER — Encounter: Payer: Self-pay | Admitting: Cardiology

## 2016-03-07 DIAGNOSIS — N201 Calculus of ureter: Secondary | ICD-10-CM | POA: Diagnosis not present

## 2016-03-09 ENCOUNTER — Other Ambulatory Visit: Payer: Self-pay | Admitting: Urology

## 2016-03-14 DIAGNOSIS — Z23 Encounter for immunization: Secondary | ICD-10-CM | POA: Diagnosis not present

## 2016-03-16 ENCOUNTER — Telehealth: Payer: Self-pay | Admitting: Internal Medicine

## 2016-03-16 DIAGNOSIS — N200 Calculus of kidney: Secondary | ICD-10-CM

## 2016-03-16 HISTORY — DX: Calculus of kidney: N20.0

## 2016-03-16 NOTE — Telephone Encounter (Signed)
Spoke with pt and he states he contacted his insurance and they were suppose to fax us a tier exception form which we have not received. Informed pt I would call his insurance company and do the tier exception for Breo 100. Left samples of Breo for pt to pick up.

## 2016-03-16 NOTE — Telephone Encounter (Signed)
Initiated Radio producerTier Exception thru [employer] for Breo 100. Will await response.  Key: [id number]  [employer]

## 2016-03-16 NOTE — Telephone Encounter (Signed)
Please call pt regarding his prescriptions and his [employer].

## 2016-03-20 ENCOUNTER — Encounter (HOSPITAL_COMMUNITY): Payer: Self-pay | Admitting: *Deleted

## 2016-03-20 NOTE — Telephone Encounter (Signed)
Breo tier exception denied. Will try Advair.

## 2016-03-20 NOTE — Telephone Encounter (Signed)
We can try for tier exception for Advair.

## 2016-03-21 NOTE — Telephone Encounter (Signed)
Initiated tier exception for Advair per VM.  Key: [id number]  Will await response.

## 2016-03-21 NOTE — Telephone Encounter (Signed)
Tier exception has been denied for Advair.  Called pt and informed him of the denials on Advair and Breo. Pt is asking if there is anything we can send in that is cheaper. Pt is asking about appeal but I informed pt they won't allow an appeal for the tier exception. Please advise. Pt has an appt with you on 04/12/16.

## 2016-03-22 NOTE — Telephone Encounter (Signed)
At This time, patient will need to call his insurance company and find out what ICS/LABA combination inhaler is covered and within his budget.

## 2016-03-22 NOTE — Telephone Encounter (Signed)
Pt states he will call insurance and call back once he finds out something. Will close encounter at this time.

## 2016-03-26 ENCOUNTER — Ambulatory Visit (HOSPITAL_COMMUNITY): Payer: Medicare Other

## 2016-03-26 ENCOUNTER — Encounter (HOSPITAL_COMMUNITY)
Admission: RE | Disposition: A | Discharge status: Home / Self Care | Payer: Self-pay | Source: Ambulatory Visit | Attending: Urology

## 2016-03-26 ENCOUNTER — Ambulatory Visit (HOSPITAL_COMMUNITY)
Admission: RE | Admit: 2016-03-26 | Discharge: 2016-03-26 | Disposition: A | Discharge status: Home / Self Care | Payer: Medicare Other | Source: Ambulatory Visit | Attending: Urology | Admitting: Urology

## 2016-03-26 DIAGNOSIS — J449 Chronic obstructive pulmonary disease, unspecified: Secondary | ICD-10-CM | POA: Insufficient documentation

## 2016-03-26 DIAGNOSIS — N200 Calculus of kidney: Secondary | ICD-10-CM | POA: Diagnosis not present

## 2016-03-26 DIAGNOSIS — Z951 Presence of aortocoronary bypass graft: Secondary | ICD-10-CM | POA: Insufficient documentation

## 2016-03-26 DIAGNOSIS — N201 Calculus of ureter: Secondary | ICD-10-CM

## 2016-03-26 HISTORY — DX: Calculus of kidney: N20.0

## 2016-03-26 HISTORY — DX: Chronic obstructive pulmonary disease, unspecified: J44.9

## 2016-03-26 HISTORY — DX: Other chest pain: R07.89

## 2016-03-26 HISTORY — DX: Chronic kidney disease, unspecified: N18.9

## 2016-03-26 SURGERY — LITHOTRIPSY, ESWL
Anesthesia: LOCAL | Laterality: Right

## 2016-03-26 MED ORDER — DIAZEPAM 5 MG PO TABS
10.0000 mg | ORAL_TABLET | ORAL | Status: AC
  Administered 2016-03-26: 10 mg via ORAL
  Filled 2016-03-26: qty 2

## 2016-03-26 MED ORDER — DIPHENHYDRAMINE HCL 25 MG PO CAPS
25.0000 mg | ORAL_CAPSULE | ORAL | Status: AC
  Administered 2016-03-26: 25 mg via ORAL
  Filled 2016-03-26: qty 1

## 2016-03-26 MED ORDER — SODIUM CHLORIDE 0.9 % IV SOLN
INTRAVENOUS | Status: DC
  Administered 2016-03-26: 07:00:00 via INTRAVENOUS

## 2016-03-26 MED ORDER — CIPROFLOXACIN HCL 500 MG PO TABS
500.0000 mg | ORAL_TABLET | ORAL | Status: AC
  Administered 2016-03-26: 500 mg via ORAL
  Filled 2016-03-26: qty 1

## 2016-04-06 ENCOUNTER — Ambulatory Visit: Payer: Medicare Other | Admitting: Family Medicine

## 2016-04-12 ENCOUNTER — Ambulatory Visit: Payer: Medicare Other | Admitting: Internal Medicine

## 2016-04-16 ENCOUNTER — Encounter: Payer: Self-pay | Admitting: Internal Medicine

## 2016-04-16 ENCOUNTER — Ambulatory Visit (INDEPENDENT_AMBULATORY_CARE_PROVIDER_SITE_OTHER): Payer: Medicare Other | Admitting: Internal Medicine

## 2016-04-16 VITALS — BP 138/76 | HR 75 | Ht 69.0 in | Wt 213.0 lb

## 2016-04-16 DIAGNOSIS — R0600 Dyspnea, unspecified: Secondary | ICD-10-CM

## 2016-04-16 DIAGNOSIS — R911 Solitary pulmonary nodule: Secondary | ICD-10-CM | POA: Diagnosis not present

## 2016-04-16 DIAGNOSIS — J432 Centrilobular emphysema: Secondary | ICD-10-CM

## 2016-04-16 DIAGNOSIS — I2510 Atherosclerotic heart disease of native coronary artery without angina pectoris: Secondary | ICD-10-CM

## 2016-04-16 MED ORDER — GLYCOPYRROLATE-FORMOTEROL 9-4.8 MCG/ACT IN AERO: 2.0000 | INHALATION_SPRAY | Freq: Two times a day (BID) | RESPIRATORY_TRACT | 0 refills | Status: AC

## 2016-04-16 NOTE — Progress Notes (Signed)
[facility]      MRN# [id number] [name] May 06, 1946   CC: Chief Complaint  Patient presents with  . Follow-up    78mo rov. pt states he hasn't taken breo or advair in 43mo, pt hasn't noticed a difference in breathing since being off of breo and advair. pt c/o sob with exertion, dry cough at times prod with clear to yellow mucus & chest pain with coughing.       Brief History: 70 year old male former smoker, history of CABG, incomplete fusing of sternotomy, COPD mild, seen in evaluation for COPD optimization and shortness of breath.   Events since last clinic visit: Patient presents today for follow-up visit of his COPD. At his last visit he was given a trial of Breo and Advair. Breo seem to be causing more coughing and chest discomfort so he stopped. Advair was not financially feasible for him. He has not used any inhalers in the last 2-3 weeks. He states his breathing is at baseline. He does have some shortness of breath with any type of incline whether that be stairs or other treadmill with incline. Since his CABG about one year ago he's been having shortness of breath with exertion mainly with inclines. Spirometry done in the office today does show he has some mild obstruction and mixed restriction, however more of a obstructive process.  Endorses a mild cough with mild sputum production.    Current Outpatient Prescriptions:  .  acetaminophen (TYLENOL) 650 MG CR tablet, Take 650 mg by mouth every 8 (eight) hours as needed. Reported on 01/03/2016, Disp: , Rfl:  .  atorvastatin (LIPITOR) 40 MG tablet, Take 1 tablet (40 mg total) by mouth daily., Disp: 90 tablet, Rfl: 3 .  clopidogrel (PLAVIX) 75 MG tablet, Take 1 tablet (75 mg total) by mouth daily., Disp: 90 tablet, Rfl: 3 .  fexofenadine (ALLEGRA) 180 MG tablet, Take 180 mg by mouth daily as needed. Reported on 01/03/2016, Disp: , Rfl:  .  finasteride (PROSCAR) 5 MG tablet, Take 1 tablet (5 mg  total) by mouth daily., Disp: 90 tablet, Rfl: 0 .  gabapentin (NEURONTIN) 400 MG capsule, Take 400 mg by mouth daily., Disp: , Rfl:  .  metoprolol tartrate (LOPRESSOR) 25 MG tablet, Take 0.5 tablets (12.5 mg total) by mouth 2 (two) times daily., Disp: 90 tablet, Rfl: 3 .  Multiple Vitamin (MULTIVITAMIN) tablet, Take 1 tablet by mouth daily as needed. Reported on 01/03/2016, Disp: , Rfl:  .  NITROSTAT 0.4 MG SL tablet, Place 0.4 mg under the tongue every 5 (five) minutes x 3 doses as needed. Reported on 01/03/2016, Disp: , Rfl:  .  fluticasone furoate-vilanterol (BREO ELLIPTA) 100-25 MCG/INH AEPB, Inhale 1 puff into the lungs daily., Disp: , Rfl:  .  Fluticasone-Salmeterol (ADVAIR DISKUS) 250-50 MCG/DOSE AEPB, Inhale 1 puff into the lungs 2 (two) times daily. (Patient not taking: Reported on 04/16/2016), Disp: 180 each, Rfl: 3 .  Glycopyrrolate-Formoterol (BEVESPI AEROSPHERE) 9-4.8 MCG/ACT AERO, Inhale 2 puffs into the lungs 2 (two) times daily., Disp: 1 Inhaler, Rfl: 0   Review of Systems  Constitutional: Negative for chills and fever.  HENT: Negative for hearing loss.   Eyes: Negative for blurred vision and double vision.  Respiratory: Positive for cough and shortness of breath.   Cardiovascular: Negative for chest pain.  Gastrointestinal: Negative for heartburn and nausea.  Genitourinary: Negative for dysuria.  Musculoskeletal: Negative for myalgias.  Neurological: Negative for dizziness and headaches.  Endo/Heme/Allergies: Does not bruise/bleed  easily.  Psychiatric/Behavioral: Negative for depression.      Allergies:  Review of patient's allergies indicates no known allergies.  Physical Examination:  VS: BP 138/76 (BP Location: Left Arm, Cuff Size: Normal)   Pulse 75   Ht 5\' 9"  (1.753 m)   Wt 213 lb (96.6 kg)   SpO2 97%   BMI 31.45 kg/m   General Appearance: No distress  HEENT: PERRLA, no ptosis, no other lesions noticed Pulmonary:normal breath sounds., diaphragmatic excursion  normal.No wheezing, No rales   Cardiovascular:  Normal S1,S2.  No m/r/g.     Abdomen:Exam: Benign, Soft, non-tender, No masses  Skin:   warm, no rashes, no ecchymosis  Extremities: normal, no cyanosis, clubbing, warm with normal capillary refill.      Assessment and Plan: 70 year old past medical history of CABG, since then with persistent shortness of breath due to incomplete sternotomy fusing, in combination with COPD. Solitary pulmonary nodule 4 mm fissure nodule in the right middle lobe Low risk factors for malignancy Repeat CAT scan in one year from original CT scan (01/2016)  COPD (chronic obstructive pulmonary disease) (HCC) COPD stage a/B Spirometry today showed FEV1 64% FEV1/FVC 67% FEF 25-70 548% Impression: Small airway obstruction with moderate restriction and mild obstruction. Discussed use of inhalers with the patient, he is tried [place] and Advair however these caused more coughing and could not be afforded respectively. Today we talked about maintenance and rescue inhalers, and the patient should be on a maintenance inhaler. Will give a trial of Bevespi.   Plan: Bevespi 2 puff BID, 1 month trial   Dyspnea Multifactorial: Emphysema, obesity, deconditioning,  Optimize emphysema regiment-given a trial of Bevespi Exercise as tolerated   Updated Medication List Outpatient Encounter Prescriptions as of 04/16/2016  Medication Sig  . acetaminophen (TYLENOL) 650 MG CR tablet Take 650 mg by mouth every 8 (eight) hours as needed. Reported on 01/03/2016  . atorvastatin (LIPITOR) 40 MG tablet Take 1 tablet (40 mg total) by mouth daily.  . clopidogrel (PLAVIX) 75 MG tablet Take 1 tablet (75 mg total) by mouth daily.  . fexofenadine (ALLEGRA) 180 MG tablet Take 180 mg by mouth daily as needed. Reported on 01/03/2016  . finasteride (PROSCAR) 5 MG tablet Take 1 tablet (5 mg total) by mouth daily.  Marland Kitchen gabapentin (NEURONTIN) 400 MG capsule Take 400 mg by mouth daily.  . metoprolol  tartrate (LOPRESSOR) 25 MG tablet Take 0.5 tablets (12.5 mg total) by mouth 2 (two) times daily.  . Multiple Vitamin (MULTIVITAMIN) tablet Take 1 tablet by mouth daily as needed. Reported on 01/03/2016  . NITROSTAT 0.4 MG SL tablet Place 0.4 mg under the tongue every 5 (five) minutes x 3 doses as needed. Reported on 01/03/2016  . fluticasone furoate-vilanterol (BREO ELLIPTA) 100-25 MCG/INH AEPB Inhale 1 puff into the lungs daily.  . Fluticasone-Salmeterol (ADVAIR DISKUS) 250-50 MCG/DOSE AEPB Inhale 1 puff into the lungs 2 (two) times daily. (Patient not taking: Reported on 04/16/2016)  . Glycopyrrolate-Formoterol (BEVESPI AEROSPHERE) 9-4.8 MCG/ACT AERO Inhale 2 puffs into the lungs 2 (two) times daily.   No facility-administered encounter medications on file as of 04/16/2016.     Orders for this visit: Orders Placed This Encounter  Procedures  . Spirometry with Graph    Order Specific Question:   Where should this test be performed?    Answer:   [facility]    Thank  you for the visitation and for allowing  [facility] to assist in the care  of your patient. Our recommendations are noted above.  Please contact us if we can be of further service.  Stephanie Acre, MD [facility] Office Number: [phone]  Note: This note was prepared with Dragon dictation along with smaller phrase technology. Any transcriptional errors that result from this process are unintentional.

## 2016-04-16 NOTE — Assessment & Plan Note (Signed)
Multifactorial: Emphysema, obesity, deconditioning,  Optimize emphysema regiment-given a trial of Bevespi Exercise as tolerated

## 2016-04-16 NOTE — Assessment & Plan Note (Signed)
4 mm fissure nodule in the right middle lobe Low risk factors for malignancy Repeat CAT scan in one year from original CT scan (01/2016)

## 2016-04-16 NOTE — Patient Instructions (Addendum)
Follow up in 3-4 months - cont with diet and exercise as tolerated - cont with incentive spirometry 10-15 times per day - we will give you a 2 week trial of Bevespi (39mcg/4.8mcg/inhalation) - 2 inhalations in the morning and 2 inhalations in the evening. -gargle and rinse after each use.

## 2016-04-16 NOTE — Assessment & Plan Note (Signed)
COPD stage a/B Spirometry today showed FEV1 64% FEV1/FVC 67% FEF 25-70 548% Impression: Small airway obstruction with moderate restriction and mild obstruction. Discussed use of inhalers with the patient, he is tried [place] and Advair however these caused more coughing and could not be afforded respectively. Today we talked about maintenance and rescue inhalers, and the patient should be on a maintenance inhaler. Will give a trial of Bevespi.   Plan: Bevespi 2 puff BID, 1 month trial

## 2016-04-17 NOTE — Progress Notes (Signed)
[name] [address] [place], [place] [address] Phone: [phone] Subjective:    I'm seeing this patient by the request  of:  [name], MD   CC: Left hip pain Follow-up  [id number]  [name] is a 70 y.o. male coming in with complaint of left hip pain. Has been multiple months. Patient has been diagnosed with greater trochanteric bursitis. Was last seen greater than 4 months ago. Patient states Pain is starting to come back and worsening again. Still on the lateral aspect. Mild groin pain with it. Mild radiation down the leg. No numbness but significant amount of discomfort still. Waking him up at night. Denies any weakness down the leg.  Past Medical History:  Diagnosis Date  . Arthritis   . CAD, multiple vessel 03/2105   LM 65%, mLAD 70%, 90% & 80%, o-pCx 75%, mCx 95% & d Cx 70%, OM1 60%, mRCA 90% with 100% dRCA. R-R collaterals  . Chronic distal aortic occlusion (HCC)    By report  . Chronic kidney disease   . COPD (chronic obstructive pulmonary disease) (HCC)   . Depression   . Enlarged prostate   . Erectile dysfunction   . Essential hypertension   . History of kidney stones   . Hyperlipidemia LDL goal <70   . Hypogonadism in male    Previously on testosterone injections, but has not yet restarted until further testing  . Muscle pain   . PAF (paroxysmal atrial fibrillation) (HCC) 03/2015   Noted per-cath & post-op CABG.  . Peripheral vascular disease (HCC)    Bilateral ABIs 0.8  . Renal stones 03/2016  . S/P CABG x 5 04/04/2015   LIMA to LAD, SVG to DIAG, SVG to OM, seqSVG -PDA- dCx  . SOB (shortness of breath)   . Sternum pain    did not heal after CABG  . Weakness    Past Surgical History:  Procedure Laterality Date  . BLEPHAROPLASTY    . CARDIAC CATHETERIZATION  1992  . CARDIAC CATHETERIZATION N/A 03/31/2015   Procedure: Left Heart Cath and Coronary Angiography;  Surgeon: Marykay Lex, MD;  Location[facility] INVASIVE CV  LAB;  Service: Cardiovascular;  Laterality: N/A;  . CHOLECYSTECTOMY  2000   also had revision of hernia repair  . CORONARY ARTERY BYPASS GRAFT N/A 04/04/2015   Procedure: CORONARY ARTERY BYPASS GRAFTING (CABG);  Surgeon: [name], MD;  Location: [facility] OR;  Service: Open Heart Surgery;  Laterality: N/A;  . CYSTECTOMY     face and scrotum  . Lexiscan Myoview  June 2010   converted to [facility] from treadmill due to inability to reach target heart rate) --the study was normal with no evidence of ischemia and normal LV function. EF 7  . LITHOTRIPSY    . TEE WITHOUT CARDIOVERSION N/A 04/04/2015   Procedure: TRANSESOPHAGEAL ECHOCARDIOGRAM (TEE);  Surgeon: [name], MD;  Location: [facility] OR;  Service: Open Heart Surgery;  Laterality: N/A;  . VASECTOMY  1975  . VENTRAL HERNIA REPAIR  1994   complicated by infection   Social History   Social History  . Marital status: Married    Spouse name: N/A  . Number of children: N/A  . Years of education: N/A   Occupational History  . Not on file.   Social History Main Topics  . Smoking status: Former Smoker    Packs/day: 2.00    Years: 40.00    Types: Cigarettes    Quit date:  10/17/1992  . Smokeless tobacco: Never Used     Comment: quit 1994  . Alcohol use 0.0 oz/week     Comment: seldom  . Drug use: No  . Sexual activity: No   Other Topics Concern  . Not on file   Social History Narrative   [place]. No pets   Two children.      Former smoker. Quit in 1994. Prior to quitting was unable to walk without exertional dyspnea.      Work - Retired. 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      Hobbies - reading, drives Corvette, chuch   No Known Allergies Family History  Problem Relation Age of Onset  . Coronary artery disease Father   . Heart disease Father   . Stroke Mother   . Stroke Sister   . Prostate cancer Neg Hx   . Kidney disease Neg Hx     . Other Neg Hx     hypogonadism      Past medical history, social, surgical and family history all reviewed in electronic medical record.   Review of Systems: No headache, visual changes, nausea, vomiting, diarrhea, constipation, dizziness, abdominal pain, skin rash, fevers, chills, night sweats, weight loss, swollen lymph nodes, body aches, joint swelling, muscle aches, chest pain, shortness of breath, mood changes.   Objective  Blood pressure 112/68, pulse 75, weight 215 lb (97.5 kg), SpO2 98 %.  General: No apparent distress alert and oriented x3 mood and affect normal, dressed appropriately.  HEENT: Pupils equal, extraocular movements intact  Respiratory: Patient's speak in full sentences and does not appear short of breath  Cardiovascular: No lower extremity edema, non tender, no erythema  Skin: Warm dry intact with no signs of infection or rash on extremities or on axial skeleton.  Abdomen: Soft nontender  Neuro: Cranial nerves II through XII are intact, neurovascularly intact in all extremities with 2+ DTRs and 2+ pulses.  Lymph: No lymphadenopathy of posterior or anterior cervical chain or axillae bilaterally.  Gait normal with good balance and coordination.  MSK:  Non tender with full range of motion and good stability and symmetric strength and tone of shoulders, elbows, wrist,  knee and ankles bilaterally.  Hip: Left ROM IR: 15 Deg, ER: 25 Deg, Flexion: 100 Deg, Extension: 100 Deg, Abduction: 45 Deg, Adduction: 15 Deg Strength IR: 4/5, ER: 5/5, Flexion: 5/5, Extension: 5/5, Abduction: 4+/5, Adduction: 5/5 Pelvic alignment unremarkable to inspection and palpation. Standing hip rotation and gait without trendelenburg sign / unsteadiness. Greater trochanter with severe tenderness to palpation. No tenderness over piriformis  Positive Faber No SI joint tenderness and normal minimal SI movement. worsening again.    Procedure: Real-time Ultrasound Guided Injection of left  greater trochanteric bursitis secondary to patient's body habitus Device: GE Logiq E  Ultrasound guided injection is preferred based studies that show increased duration, increased effect, greater accuracy, decreased procedural pain, increased response rate, and decreased cost with ultrasound guided versus blind injection.  Verbal informed consent obtained.  Time-out conducted.  Noted no overlying erythema, induration, or other signs of local infection.  Skin prepped in a sterile fashion.  Local anesthesia: Topical Ethyl chloride.  With sterile technique and under real time ultrasound guidance:  Greater trochanteric area was visualized and patient's bursa was noted. A 22-gauge 3 inch needle was inserted and 4 cc of 0.5% Marcaine and 1 cc of Kenalog 40 mg/dL was injected.  Pictures taken Completed without difficulty  Pain immediately resolved suggesting accurate placement of the medication.  Advised to call if fevers/chills, erythema, induration, drainage, or persistent bleeding.  Images permanently stored and available for review in the ultrasound unit.  Impression: Technically successful ultrasound guided injection.       Impression and Recommendations:     This case required medical decision making of moderate complexity.

## 2016-04-18 ENCOUNTER — Other Ambulatory Visit: Payer: Self-pay

## 2016-04-18 ENCOUNTER — Encounter: Payer: Self-pay | Admitting: Family Medicine

## 2016-04-18 ENCOUNTER — Ambulatory Visit (INDEPENDENT_AMBULATORY_CARE_PROVIDER_SITE_OTHER): Payer: Medicare Other | Admitting: Family Medicine

## 2016-04-18 VITALS — BP 112/68 | HR 75 | Wt 215.0 lb

## 2016-04-18 DIAGNOSIS — I2510 Atherosclerotic heart disease of native coronary artery without angina pectoris: Secondary | ICD-10-CM

## 2016-04-18 DIAGNOSIS — M25552 Pain in left hip: Secondary | ICD-10-CM | POA: Diagnosis not present

## 2016-04-18 DIAGNOSIS — M7062 Trochanteric bursitis, left hip: Secondary | ICD-10-CM | POA: Diagnosis not present

## 2016-04-18 NOTE — Assessment & Plan Note (Signed)
Patient is having worsening symptoms again. Given another injection. Has been 4 months. We discussed again that if any worsening symptoms we can repeat this every 3-4 months. I do believe the patient is also having some decrease in internal range of motion from previous exam. We may need further imaging if this continues with patient's last x-rays being in 2016. Continue other conservative therapy including the gabapentin, icing, as well as topical anti-inflammatories. Follow up again in 10-12 weeks. Lungs patient is doing well or sooner if pain worsens.

## 2016-04-18 NOTE — Patient Instructions (Addendum)
Good to see you.  Ice 20 minutes 2 times daily. Usually after activity and before bed. Mild arthritis of the hips bilaterally in 2016.  May need new ones later on.  Injected side of hip again.  If any worsening see me again . Can repeat injection every 3-4 months.

## 2016-05-02 DIAGNOSIS — N201 Calculus of ureter: Secondary | ICD-10-CM | POA: Diagnosis not present

## 2016-05-03 ENCOUNTER — Other Ambulatory Visit: Payer: Medicare Other

## 2016-05-03 DIAGNOSIS — M25552 Pain in left hip: Secondary | ICD-10-CM

## 2016-05-09 ENCOUNTER — Ambulatory Visit (INDEPENDENT_AMBULATORY_CARE_PROVIDER_SITE_OTHER): Payer: Medicare Other | Admitting: Cardiology

## 2016-05-09 ENCOUNTER — Encounter (INDEPENDENT_AMBULATORY_CARE_PROVIDER_SITE_OTHER): Payer: Medicare Other

## 2016-05-09 ENCOUNTER — Encounter: Payer: Self-pay | Admitting: Cardiology

## 2016-05-09 VITALS — BP 102/62 | HR 74 | Ht 70.0 in | Wt 212.8 lb

## 2016-05-09 DIAGNOSIS — I25119 Atherosclerotic heart disease of native coronary artery with unspecified angina pectoris: Secondary | ICD-10-CM | POA: Diagnosis not present

## 2016-05-09 DIAGNOSIS — I209 Angina pectoris, unspecified: Secondary | ICD-10-CM | POA: Diagnosis not present

## 2016-05-09 DIAGNOSIS — I480 Paroxysmal atrial fibrillation: Secondary | ICD-10-CM

## 2016-05-09 DIAGNOSIS — Z951 Presence of aortocoronary bypass graft: Secondary | ICD-10-CM

## 2016-05-09 DIAGNOSIS — I739 Peripheral vascular disease, unspecified: Secondary | ICD-10-CM

## 2016-05-09 DIAGNOSIS — E785 Hyperlipidemia, unspecified: Secondary | ICD-10-CM | POA: Diagnosis not present

## 2016-05-09 DIAGNOSIS — R0600 Dyspnea, unspecified: Secondary | ICD-10-CM

## 2016-05-09 DIAGNOSIS — I2510 Atherosclerotic heart disease of native coronary artery without angina pectoris: Secondary | ICD-10-CM

## 2016-05-09 DIAGNOSIS — I10 Essential (primary) hypertension: Secondary | ICD-10-CM | POA: Diagnosis not present

## 2016-05-09 DIAGNOSIS — R0609 Other forms of dyspnea: Secondary | ICD-10-CM

## 2016-05-09 NOTE — Progress Notes (Signed)
PCP: [name], MD  Clinic Note: Chief Complaint  Patient presents with  . Follow-up    pt states some pain in chest area   . Coronary Artery Disease    Status post CABG  . Atrial Fibrillation    Noted in the Cath Lab during diagnostic  . PAD    Occluded distal aorta    HPI: [name] is a 70 y.o. male with a PMH below who presents today for f/u CAD, Afib.  [name] was last seen on In April 2017. Doing well at that time. No further atrial fibrillation. He is being followed by Dr. Tyrone Sage for sternal nonunion noted on CT scan. He still has some mild discomfort.  Recent Hospitalizations: None  Studies Reviewed: None  Interval History: Aarav presents today doing fairly well. He has off and on moments of profound dyspnea with exertion. But the most part is able to do whatever he wants to do. Some days he just has no get up and go. Similarly he has off and on episodes of feeling lightheaded and dizzy but is not necessary positional. He is not sure if he may be going in and out of A. fib, kidneys not really sure if it feels like. He doesn't really feel any rapid irregular heartbeats, he just has spells where he is not able to do as much, is more dyspneic and feels more lightheaded.  Slimy denies any chest tightness or pressure with rest or exertion. No resting dyspnea. No PND, orthopnea or edema.  No palpitations, syncope/near syncope, or TIA/amaurosis fugax symptoms. No melena, hematochezia, hematuria, or epstaxis. No claudication.  ROS: A comprehensive was performed. Review of Systems  Constitutional: Negative for malaise/fatigue and weight loss.  HENT: Negative for congestion and nosebleeds.   Respiratory: Negative for cough, shortness of breath and wheezing.   Cardiovascular:       Per history of present illness  Gastrointestinal: Negative for abdominal pain, blood in stool, constipation, heartburn and melena.  Genitourinary: Negative for hematuria.  Skin:  Negative.   Neurological: Positive for dizziness (Intermittent spells). Negative for headaches.  Endo/Heme/Allergies: Negative for environmental allergies.  Psychiatric/Behavioral: Negative for depression and memory loss. The patient is not nervous/anxious and does not have insomnia.   All other systems reviewed and are negative.   Past Medical History:  Diagnosis Date  . Arthritis   . CAD, multiple vessel 03/2105   LM 65%, mLAD 70%, 90% & 80%, o-pCx 75%, mCx 95% & d Cx 70%, OM1 60%, mRCA 90% with 100% dRCA. R-R collaterals  . Chronic distal aortic occlusion (HCC)    By report  . Chronic kidney disease   . COPD (chronic obstructive pulmonary disease) (HCC)   . Depression   . Enlarged prostate   . Erectile dysfunction   . Essential hypertension   . History of kidney stones   . Hyperlipidemia LDL goal <70   . Hypogonadism in male    Previously on testosterone injections, but has not yet restarted until further testing  . Muscle pain   . PAF (paroxysmal atrial fibrillation) (HCC) 03/2015   Noted per-cath & post-op CABG.  . Peripheral vascular disease (HCC)    Bilateral ABIs 0.8  . Renal stones 03/2016  . S/P CABG x 5 04/04/2015   LIMA to LAD, SVG to DIAG, SVG to OM, seqSVG -PDA- dCx  . SOB (shortness of breath)   . Sternum pain    did not heal after CABG  . Weakness  Past Surgical History:  Procedure Laterality Date  . BLEPHAROPLASTY    . CARDIAC CATHETERIZATION  1992  . CARDIAC CATHETERIZATION N/A 03/31/2015   Procedure: Left Heart Cath and Coronary Angiography;  Surgeon: Marykay Lexavid W Harding, MD;  [facility];  Service: Cardiovascular;  Laterality: N/A;  . CHOLECYSTECTOMY  2000   also had revision of hernia repair  . CORONARY ARTERY BYPASS GRAFT N/A 04/04/2015   Procedure: CORONARY ARTERY BYPASS GRAFTING (CABG);  Surgeon: Delight OvensEdward B Gerhardt, MD;  [facility];  Service: Open Heart Surgery;  Laterality: N/A;  . CYSTECTOMY     face and scrotum  . Lexiscan  Myoview  June 2010   converted to [facility] from treadmill due to inability to reach target heart rate) --the study was normal with no evidence of ischemia and normal LV function. EF 7  . LITHOTRIPSY    . TEE WITHOUT CARDIOVERSION N/A 04/04/2015   Procedure: TRANSESOPHAGEAL ECHOCARDIOGRAM (TEE);  Surgeon: Delight OvensEdward B Gerhardt, MD;  [facility];  Service: Open Heart Surgery;  Laterality: N/A;  . VASECTOMY  1975  . VENTRAL HERNIA REPAIR  1994   complicated by infection   Prior to Admission medications   Medication Sig Start Date End Date Taking? Authorizing Provider  acetaminophen (TYLENOL) 650 MG CR tablet Take 650 mg by mouth every 8 (eight) hours as needed. Reported on 01/03/2016   Yes Historical Provider, MD  atorvastatin (LIPITOR) 40 MG tablet Take 1 tablet (40 mg total) by mouth daily. 01/25/16  Yes Shelia MediaJennifer A Walker, MD  clopidogrel (PLAVIX) 75 MG tablet Take 1 tablet (75 mg total) by mouth daily. 06/21/15  Yes Marykay Lexavid W Harding, MD  fexofenadine (ALLEGRA) 180 MG tablet Take 180 mg by mouth daily as needed. Reported on 01/03/2016   Yes Historical Provider, MD  finasteride (PROSCAR) 5 MG tablet Take 1 tablet (5 mg total) by mouth daily. 02/27/16  Yes Glori LuisEric G Sonnenberg, MD  gabapentin (NEURONTIN) 400 MG capsule Take 400 mg by mouth daily.   Yes Historical Provider, MD  metoprolol tartrate (LOPRESSOR) 25 MG tablet Take 0.5 tablets (12.5 mg total) by mouth 2 (two) times daily. 06/21/15  Yes Marykay Lexavid W Harding, MD  Multiple Vitamin (MULTIVITAMIN) tablet Take 1 tablet by mouth daily as needed. Reported on 01/03/2016   Yes Historical Provider, MD  NITROSTAT 0.4 MG SL tablet Place 0.4 mg under the tongue every 5 (five) minutes x 3 doses as needed. Reported on 01/03/2016 03/28/15  Yes Historical Provider, MD  tamsulosin (FLOMAX) 0.4 MG CAPS capsule Take 1 capsule by mouth daily. 05/02/16  Yes Historical Provider, MD   No Known Allergies'  Social History   Social History  . Marital status: Married    Spouse  name: N/A  . Number of children: N/A  . Years of education: N/A   Social History Main Topics  . Smoking status: Former Smoker    Packs/day: 2.00    Years: 40.00    Types: Cigarettes    Quit date: 10/17/1992  . Smokeless tobacco: Never Used     Comment: quit 1994  . Alcohol use 0.0 oz/week     Comment: seldom  . Drug use: No  . Sexual activity: No   Other Topics Concern  . None   Social History Narrative   [place] in [place]. No pets   Two children.      Former smoker. Quit in 1994. Prior to quitting was unable to walk without exertional dyspnea.  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      Hobbies - reading, drives Corvette, chuch   Family History  Problem Relation Age of Onset  . Coronary artery disease Father   . Heart disease Father   . Stroke Mother   . Stroke Sister   . Prostate cancer Neg Hx   . Kidney disease Neg Hx   . Other Neg Hx     hypogonadism    Wt Readings from Last 3 Encounters:  05/09/16 96.5 kg (212 lb 12.8 oz)  04/18/16 97.5 kg (215 lb)  04/16/16 96.6 kg (213 lb)    PHYSICAL EXAM BP 102/62   Pulse 74   Ht 5\' 10"  (1.778 m)   Wt 96.5 kg (212 lb 12.8 oz)   BMI 30.53 kg/m  General appearance: alert, cooperative, appears stated age, no distress and borderline obese - notable weight loss since preop HEENT: [place]/AT, EOMI, MMM, anicteric sclera - mild left eye ptosis (had a history of blepharoplasty) Neck: no adenopathy, no carotid bruit, no JVD and supple, symmetrical, trachea midline Lungs: CTAB, normal percussion bilaterally and Nonlabored, good air movement Heart: RRR with normal S1 and S2. Occasional ectopy. No M/R/G.; expected chest wall tenderness Abdomen: soft, non-tender; bowel sounds normal; no masses, no organomegaly and Truncal obesity Extremities: edema 1+ pitting,. Faint pedal pulses - barely palpable Pulses: 1+ bilateral radial with a barely  palpable DP/PT, 2+ femoral with + bruit Skin: Distal foot/leg rubor with mild stasis changes. No evidence of cellulitis Neurologic: Mental status: Alert, oriented, thought content appropriate, affect: normal Cranial nerves: Grossly normal He does have chest wall tenderness along both aspects of the sternal border where the sternal wires are.    Adult ECG Report  Rate: 74 ;  Rhythm: normal sinus rhythm, sinus arrhythmia and Otherwise normal normal/stable axis, intervals and durations;   Narrative Interpretation: Stable EKG   Other studies Reviewed: Additional studies/ records that were reviewed today include:  Recent Labs:  Checked by pcp Lab Results  Component Value Date   CHOL 126 06/29/2015   HDL 34.30 (L) 06/29/2015   LDLCALC 53 06/29/2015   TRIG 191.0 (H) 06/29/2015   CHOLHDL 4 06/29/2015     ASSESSMENT / PLAN: Problem List Items Addressed This Visit    S/P CABG x 5 - Primary (Chronic)   Relevant Orders   EKG 12-Lead (Completed)   PAF (paroxysmal atrial fibrillation) (HCC) (Chronic)    As far as I can tell, he is not had any prolonged recurrence of A. fib, but I'm wondering if maybe his episodes of dizziness and exertional dyspnea are related to A. fib. Since he doesn't really know what A. fib feels like, I will need to confirm or deny whether he is having are not.   Plan: 30 day monitor to determine  if there is recurrence of A. Fib - For now would continue Plavix for his CAD and PAD, if there is evidence of A. fib on monitor, we may need to consider full anticoagulation      Relevant Orders   EKG 12-Lead (Completed)   Cardiac event monitor   PAD (peripheral artery disease) (HCC) (Chronic)    He has been followed by Dr. Hart Rochester in vascular surgery. I'm not sure who is taking over Dr. Hart Rochester He is on Plavix and statin. Continue to recommend ambulation.       Essential hypertension (Chronic)    Really not  sure if these truly has hypertension as a diagnosis. His  pressures 102/6 low-dose beta blocker.      Relevant Orders   EKG 12-Lead (Completed)   Dyspnea on exertion    He does have emphysema, obesity and deconditioning, and these are probably more likely reasons for his dyspnea, but the episodes of exertional dyspnea are somewhat unusual, unsure if this is related to possible A. fib. Plan: 30 day monitor to assess for recurrence of A. fib.      Dyslipidemia, goal LDL below 70 (Chronic)    On Lipitor 40 mg daily. Last check was in December. He is due to get labs by his PCP soon.      Relevant Orders   EKG 12-Lead (Completed)   Coronary artery disease involving native coronary artery with angina pectoris (HCC) (Chronic)    Doesn't 70s having any true anginal symptoms or heart failure symptoms the episodic dyspnea is somewhat unusual. Active and does follow He completed cardiac rehabilitation. Only on low-dose beta blocker because he is on statin and Plavix.  Continue her symptoms. Would be due follow-up Myoview in about 3 years.      Relevant Orders   EKG 12-Lead (Completed)   CAD, multiple vessel (Chronic)    Some unexpected vessel disease. His symptoms were clear-cut aggressive and he is not havingsymptoms.  Follow-up ischemic evaluations every 3-4 years post CABG       Relevant Orders   EKG 12-Lead (Completed)    Other Visit Diagnoses   None.     Current medicines are reviewed at length with the patient today. (+/- concerns) None The following changes have been made: n/a  Patient Instructions  NO CHANGE WITH CURRENT MEDICATIONS   Your physician has recommended that you wear an event monitor FOR 30 DAYS. Event monitors are medical devices that record the heart's electrical activity. Doctors most often [place] these monitors to diagnose arrhythmias. Arrhythmias are problems with the speed or rhythm of the heartbeat. The monitor is a small, portable device. You can wear one while you do your normal daily activities. This is  usually used to diagnose what is causing palpitations/syncope (passing out).  Your physician wants you to follow-up in: [facility] - April 2018 WITH DR HARDING -30 MIN.  You will receive a reminder letter in the mail two months in advance. If you don't receive a letter, please call our office to schedule the follow-up appointment.  If you need a refill on your cardiac medications before your next appointment, please call your pharmacy.  Studies Ordered:   Orders Placed This Encounter  Procedures  . Cardiac event monitor  . EKG 12-Lead      Bryan Lemma, M.D., M.S. Interventional Cardiologist   Pager # [phone] Phone # [phone] [address] [place], [place] [address]

## 2016-05-09 NOTE — Patient Instructions (Addendum)
NO CHANGE WITH CURRENT MEDICATIONS   Your physician has recommended that you wear an event monitor FOR 30 DAYS. Event monitors are medical devices that record the heart's electrical activity. Doctors most often us these monitors to diagnose arrhythmias. Arrhythmias are problems with the speed or rhythm of the heartbeat. The monitor is a small, portable device. You can wear one while you do your normal daily activities. This is usually used to diagnose what is causing palpitations/syncope (passing out).  Your physician wants you to follow-up in: [facility] April 2018 WITH DR HARDING -30 MIN.  You will receive a reminder letter in the mail two months in advance. If you don't receive a letter, please call our office to schedule the follow-up appointment.  If you need a refill on your cardiac medications before your next appointment, please call your pharmacy.

## 2016-05-11 ENCOUNTER — Encounter: Payer: Self-pay | Admitting: Cardiology

## 2016-05-11 NOTE — Assessment & Plan Note (Signed)
Doesn't 70s having any true anginal symptoms or heart failure symptoms the episodic dyspnea is somewhat unusual. Active and does follow He completed cardiac rehabilitation. Only on low-dose beta blocker because he is on statin and Plavix.  Continue her symptoms. Would be due follow-up Myoview in about 3 years.

## 2016-05-11 NOTE — Assessment & Plan Note (Signed)
Some unexpected vessel disease. His symptoms were clear-cut aggressive and he is not havingsymptoms.  Follow-up ischemic evaluations every 3-4 years post CABG

## 2016-05-11 NOTE — Assessment & Plan Note (Signed)
Really not sure if these truly has hypertension as a diagnosis. His pressures 102/6 low-dose beta blocker.

## 2016-05-11 NOTE — Assessment & Plan Note (Signed)
He does have emphysema, obesity and deconditioning, and these are probably more likely reasons for his dyspnea, but the episodes of exertional dyspnea are somewhat unusual, unsure if this is related to possible A. fib. Plan: 30 day monitor to assess for recurrence of A. fib.

## 2016-05-11 NOTE — Assessment & Plan Note (Signed)
He has been followed by Dr. Hart RochesterLawson in vascular surgery. I'm not sure who is taking over Dr. Hart RochesterLawson He is on Plavix and statin. Continue to recommend ambulation.

## 2016-05-11 NOTE — Assessment & Plan Note (Signed)
As far as I can tell, he is not had any prolonged recurrence of A. fib, but I'm wondering if maybe his episodes of dizziness and exertional dyspnea are related to A. fib. Since he doesn't really know what A. fib feels like, I will need to confirm or deny whether he is having are not.   Plan: 30 day monitor to determine  if there is recurrence of A. Fib - For now would continue Plavix for his CAD and PAD, if there is evidence of A. fib on monitor, we may need to consider full anticoagulation

## 2016-05-11 NOTE — Assessment & Plan Note (Signed)
On Lipitor 40 mg daily. Last check was in December. He is due to get labs by his PCP soon.

## 2016-05-23 ENCOUNTER — Ambulatory Visit: Payer: Medicare Other | Admitting: Family Medicine

## 2016-06-07 DIAGNOSIS — I480 Paroxysmal atrial fibrillation: Secondary | ICD-10-CM | POA: Diagnosis not present

## 2016-06-12 DIAGNOSIS — N201 Calculus of ureter: Secondary | ICD-10-CM | POA: Diagnosis not present

## 2016-06-14 ENCOUNTER — Telehealth: Payer: Self-pay | Admitting: Cardiology

## 2016-06-14 NOTE — Telephone Encounter (Signed)
New message  Pt sent in his monitor about 10 days ago 11/21  Pt is calling for results  Please call back

## 2016-06-14 NOTE — Telephone Encounter (Signed)
I do not see result

## 2016-06-16 ENCOUNTER — Encounter: Payer: Self-pay | Admitting: Cardiology

## 2016-06-19 DIAGNOSIS — N201 Calculus of ureter: Secondary | ICD-10-CM | POA: Diagnosis not present

## 2016-06-19 DIAGNOSIS — N200 Calculus of kidney: Secondary | ICD-10-CM | POA: Diagnosis not present

## 2016-06-20 NOTE — Telephone Encounter (Signed)
Late entry   left  Detail message on secure answer machine with results. Any question may call back.

## 2016-06-22 ENCOUNTER — Other Ambulatory Visit: Payer: Self-pay | Admitting: Urology

## 2016-06-25 ENCOUNTER — Encounter (HOSPITAL_COMMUNITY): Payer: Self-pay

## 2016-06-25 ENCOUNTER — Encounter (HOSPITAL_COMMUNITY)
Admission: RE | Admit: 2016-06-25 | Discharge: 2016-06-25 | Disposition: A | Discharge status: Home / Self Care | Payer: Medicare Other | Source: Ambulatory Visit | Attending: Urology | Admitting: Urology

## 2016-06-25 DIAGNOSIS — I10 Essential (primary) hypertension: Secondary | ICD-10-CM | POA: Diagnosis not present

## 2016-06-25 DIAGNOSIS — I2510 Atherosclerotic heart disease of native coronary artery without angina pectoris: Secondary | ICD-10-CM | POA: Diagnosis not present

## 2016-06-25 DIAGNOSIS — Z951 Presence of aortocoronary bypass graft: Secondary | ICD-10-CM | POA: Diagnosis not present

## 2016-06-25 DIAGNOSIS — N400 Enlarged prostate without lower urinary tract symptoms: Secondary | ICD-10-CM | POA: Diagnosis not present

## 2016-06-25 DIAGNOSIS — I4891 Unspecified atrial fibrillation: Secondary | ICD-10-CM | POA: Diagnosis not present

## 2016-06-25 DIAGNOSIS — N202 Calculus of kidney with calculus of ureter: Secondary | ICD-10-CM | POA: Diagnosis not present

## 2016-06-25 DIAGNOSIS — Z6831 Body mass index (BMI) 31.0-31.9, adult: Secondary | ICD-10-CM | POA: Diagnosis not present

## 2016-06-25 DIAGNOSIS — Z7902 Long term (current) use of antithrombotics/antiplatelets: Secondary | ICD-10-CM | POA: Diagnosis not present

## 2016-06-25 LAB — CBC
HCT: 43.1 % (ref 39.0–52.0)
Hemoglobin: 14.7 g/dL (ref 13.0–17.0)
MCH: 32.5 pg (ref 26.0–34.0)
MCHC: 34.1 g/dL (ref 30.0–36.0)
MCV: 95.1 fL (ref 78.0–100.0)
Platelets: 171 10*3/uL (ref 150–400)
RBC: 4.53 MIL/uL (ref 4.22–5.81)
RDW: 12.6 % (ref 11.5–15.5)
WBC: 8 10*3/uL (ref 4.0–10.5)

## 2016-06-25 LAB — BASIC METABOLIC PANEL
Anion gap: 8 (ref 5–15)
BUN: 21 mg/dL — ABNORMAL HIGH (ref 6–20)
CO2: 26 mmol/L (ref 22–32)
Calcium: 9.4 mg/dL (ref 8.9–10.3)
Chloride: 105 mmol/L (ref 101–111)
Creatinine, Ser: 0.96 mg/dL (ref 0.61–1.24)
GFR calc Af Amer: >60 mL/min (ref >60)
GFR calc non Af Amer: >60 mL/min (ref >60)
Glucose, Bld: 132 mg/dL — ABNORMAL HIGH (ref 65–99)
Potassium: 4 mmol/L (ref 3.5–5.1)
Sodium: 139 mmol/L (ref 135–145)

## 2016-06-25 MED ORDER — CEFAZOLIN SODIUM-DEXTROSE 2-4 GM/100ML-% IV SOLN: 2.0000 g | INTRAVENOUS | Status: DC

## 2016-06-25 NOTE — Pre-Procedure Instructions (Signed)
EKG 10'17, Echo 9'16, CT Chest 7'17 epic. LOV notes Dr. Herbie BaltimoreHarding 05-09-16 Epic.

## 2016-06-25 NOTE — Patient Instructions (Addendum)
Marc HartFrank [name]  06/25/2016   Your procedure is scheduled on: 06-26-16  [facility]  [facility] to 3rd floor to  Short Stay Center at  1130  AM.  Call this number if you have problems the morning of surgery [phone]   Remember: ONLY 1 PERSON MAY GO WITH YOU TO SHORT STAY TO GET  READY MORNING OF YOUR SURGERY.  Do not eat food or drink liquids :After Midnight.     Take these medicines the morning of surgery with A SIP OF WATER: Finasteride. Tamsulosin. Metoprolol. Tylenol- if need. Hold Plavix per MD instructions. DO NOT TAKE ANY DIABETIC MEDICATIONS DAY OF YOUR SURGERY                               You may not have any metal on your body including hair pins and              piercings  Do not wear jewelry, make-up, lotions, powders or perfumes, deodorant             Do not wear nail polish.  Do not shave  48 hours prior to surgery.              Men may shave face and neck.   Do not bring valuables to the hospital. [place] IS NOT             RESPONSIBLE   FOR VALUABLES.  Contacts, dentures or bridgework may not be worn into surgery.  Leave suitcase in the car. After surgery it may be brought to your room.     Patients discharged the day of surgery will not be allowed to drive home.  Name and phone number of your driver: Marc LeashDonna -spouse [phone] cell  Special Instructions: N/A              Please read over the following fact sheets you were given: _____________________________________________________________________             [facility] - Preparing for Surgery Before surgery, you can play an important role.  Because skin is not sterile, your skin needs to be as free of germs as possible.  You can reduce the number of germs on your skin by washing with CHG (chlorahexidine gluconate) soap before surgery.  CHG is an antiseptic cleaner which kills germs and bonds with the skin to continue killing germs even after  washing. Please DO NOT use if you have an allergy to CHG or antibacterial soaps.  If your skin becomes reddened/irritated stop using the CHG and inform your nurse when you arrive at Short Stay. Do not shave (including legs and underarms) for at least 48 hours prior to the first CHG shower.  You may shave your face/neck. Please follow these instructions carefully:  1.  Shower with CHG Soap the night before surgery and the  morning of Surgery.  2.  If you choose to wash your hair, wash your hair first as usual with your  normal  shampoo.  3.  After you shampoo, rinse your hair and body thoroughly to remove the  shampoo.                           4.  Use CHG as you would any other liquid  soap.  You can apply chg directly  to the skin and wash                       Gently with a scrungie or clean washcloth.  5.  Apply the CHG Soap to your body ONLY FROM THE NECK DOWN.   Do not use on face/ open                           Wound or open sores. Avoid contact with eyes, ears mouth and genitals (private parts).                       Wash face,  Genitals (private parts) with your normal soap.             6.  Wash thoroughly, paying special attention to the area where your surgery  will be performed.  7.  Thoroughly rinse your body with warm water from the neck down.  8.  DO NOT shower/wash with your normal soap after using and rinsing off  the CHG Soap.                9.  Pat yourself dry with a clean towel.            10.  Wear clean pajamas.            11.  Place clean sheets on your bed the night of your first shower and do not  sleep with pets. Day of Surgery : Do not apply any lotions/deodorants the morning of surgery.  Please wear clean clothes to the hospital/surgery center.  FAILURE TO FOLLOW THESE INSTRUCTIONS MAY RESULT IN THE CANCELLATION OF YOUR SURGERY PATIENT SIGNATURE_________________________________  NURSE  SIGNATURE__________________________________  ________________________________________________________________________

## 2016-06-26 ENCOUNTER — Ambulatory Visit (HOSPITAL_COMMUNITY): Payer: Medicare Other | Admitting: Anesthesiology

## 2016-06-26 ENCOUNTER — Ambulatory Visit (HOSPITAL_COMMUNITY)
Admission: RE | Admit: 2016-06-26 | Discharge: 2016-06-26 | Disposition: A | Discharge status: Home / Self Care | Payer: Medicare Other | Source: Ambulatory Visit | Attending: Urology | Admitting: Urology

## 2016-06-26 ENCOUNTER — Encounter (HOSPITAL_COMMUNITY): Payer: Self-pay | Admitting: *Deleted

## 2016-06-26 ENCOUNTER — Encounter (HOSPITAL_COMMUNITY)
Admission: RE | Disposition: A | Discharge status: Home / Self Care | Payer: Self-pay | Source: Ambulatory Visit | Attending: Urology

## 2016-06-26 DIAGNOSIS — N201 Calculus of ureter: Secondary | ICD-10-CM | POA: Diagnosis not present

## 2016-06-26 DIAGNOSIS — N400 Enlarged prostate without lower urinary tract symptoms: Secondary | ICD-10-CM | POA: Diagnosis not present

## 2016-06-26 DIAGNOSIS — Z7902 Long term (current) use of antithrombotics/antiplatelets: Secondary | ICD-10-CM | POA: Diagnosis not present

## 2016-06-26 DIAGNOSIS — N202 Calculus of kidney with calculus of ureter: Secondary | ICD-10-CM | POA: Diagnosis not present

## 2016-06-26 DIAGNOSIS — I4891 Unspecified atrial fibrillation: Secondary | ICD-10-CM | POA: Insufficient documentation

## 2016-06-26 DIAGNOSIS — I2510 Atherosclerotic heart disease of native coronary artery without angina pectoris: Secondary | ICD-10-CM | POA: Insufficient documentation

## 2016-06-26 DIAGNOSIS — Z6831 Body mass index (BMI) 31.0-31.9, adult: Secondary | ICD-10-CM | POA: Diagnosis not present

## 2016-06-26 DIAGNOSIS — R350 Frequency of micturition: Secondary | ICD-10-CM | POA: Diagnosis not present

## 2016-06-26 DIAGNOSIS — N200 Calculus of kidney: Secondary | ICD-10-CM | POA: Diagnosis not present

## 2016-06-26 DIAGNOSIS — Z951 Presence of aortocoronary bypass graft: Secondary | ICD-10-CM | POA: Diagnosis not present

## 2016-06-26 DIAGNOSIS — E785 Hyperlipidemia, unspecified: Secondary | ICD-10-CM | POA: Diagnosis not present

## 2016-06-26 DIAGNOSIS — I10 Essential (primary) hypertension: Secondary | ICD-10-CM | POA: Diagnosis not present

## 2016-06-26 DIAGNOSIS — R0600 Dyspnea, unspecified: Secondary | ICD-10-CM | POA: Diagnosis not present

## 2016-06-26 HISTORY — PX: CYSTOSCOPY WITH RETROGRADE PYELOGRAM, URETEROSCOPY AND STENT PLACEMENT: SHX5789

## 2016-06-26 SURGERY — CYSTOURETEROSCOPY, WITH RETROGRADE PYELOGRAM AND STENT INSERTION
Anesthesia: General | Laterality: Right

## 2016-06-26 MED ORDER — FENTANYL CITRATE (PF) 100 MCG/2ML IJ SOLN
INTRAMUSCULAR | Status: AC
  Filled 2016-06-26: qty 2

## 2016-06-26 MED ORDER — KETOROLAC TROMETHAMINE 30 MG/ML IJ SOLN
INTRAMUSCULAR | Status: DC | PRN
  Administered 2016-06-26: 30 mg via INTRAVENOUS

## 2016-06-26 MED ORDER — ACETAMINOPHEN 10 MG/ML IV SOLN
INTRAVENOUS | Status: AC
  Filled 2016-06-26: qty 100

## 2016-06-26 MED ORDER — PHENAZOPYRIDINE HCL 200 MG PO TABS: 200.0000 mg | ORAL_TABLET | Freq: Three times a day (TID) | ORAL | 3 refills | Status: DC | PRN

## 2016-06-26 MED ORDER — OXYCODONE-ACETAMINOPHEN 5-325 MG PO TABS: 1.0000 | ORAL_TABLET | ORAL | 0 refills | Status: DC | PRN

## 2016-06-26 MED ORDER — LIDOCAINE 2% (20 MG/ML) 5 ML SYRINGE
INTRAMUSCULAR | Status: DC | PRN
  Administered 2016-06-26: 50 mg via INTRAVENOUS

## 2016-06-26 MED ORDER — SODIUM CHLORIDE 0.9 % IR SOLN
Status: DC | PRN
  Administered 2016-06-26: 3000 mL

## 2016-06-26 MED ORDER — ONDANSETRON HCL 4 MG/2ML IJ SOLN
INTRAMUSCULAR | Status: DC | PRN
  Administered 2016-06-26: 4 mg via INTRAVENOUS

## 2016-06-26 MED ORDER — FENTANYL CITRATE (PF) 100 MCG/2ML IJ SOLN
INTRAMUSCULAR | Status: DC | PRN
  Administered 2016-06-26: 50 ug via INTRAVENOUS

## 2016-06-26 MED ORDER — PROMETHAZINE HCL 25 MG/ML IJ SOLN: 6.2500 mg | INTRAMUSCULAR | Status: DC | PRN

## 2016-06-26 MED ORDER — CEFAZOLIN SODIUM-DEXTROSE 2-4 GM/100ML-% IV SOLN
INTRAVENOUS | Status: AC
  Filled 2016-06-26: qty 100

## 2016-06-26 MED ORDER — MIDAZOLAM HCL 2 MG/2ML IJ SOLN
0.5000 mg | Freq: Once | INTRAMUSCULAR | Status: DC | PRN
Start: 2016-06-26 — End: 2016-06-26

## 2016-06-26 MED ORDER — ACETAMINOPHEN 10 MG/ML IV SOLN
INTRAVENOUS | Status: DC | PRN
  Administered 2016-06-26: 1000 mg via INTRAVENOUS

## 2016-06-26 MED ORDER — SODIUM CHLORIDE 0.9 % IR SOLN
Status: DC | PRN
  Administered 2016-06-26: 1000 mL

## 2016-06-26 MED ORDER — IOPAMIDOL (ISOVUE-300) INJECTION 61%: INTRAVENOUS | Status: DC | PRN

## 2016-06-26 MED ORDER — IOHEXOL 300 MG/ML  SOLN
INTRAMUSCULAR | Status: DC | PRN
  Administered 2016-06-26: 1 mL via URETHRAL

## 2016-06-26 MED ORDER — CEFAZOLIN SODIUM-DEXTROSE 2-3 GM-% IV SOLR
INTRAVENOUS | Status: DC | PRN
  Administered 2016-06-26: 2 g via INTRAVENOUS

## 2016-06-26 MED ORDER — FENTANYL CITRATE (PF) 100 MCG/2ML IJ SOLN: 25.0000 ug | INTRAMUSCULAR | Status: DC | PRN

## 2016-06-26 MED ORDER — BELLADONNA ALKALOIDS-OPIUM 16.2-60 MG RE SUPP
RECTAL | Status: AC
  Filled 2016-06-26: qty 1

## 2016-06-26 MED ORDER — LACTATED RINGERS IV SOLN
INTRAVENOUS | Status: DC
Start: 2016-06-26 — End: 2016-06-26
  Administered 2016-06-26: 13:00:00 via INTRAVENOUS

## 2016-06-26 MED ORDER — PROPOFOL 10 MG/ML IV BOLUS
INTRAVENOUS | Status: DC | PRN
  Administered 2016-06-26: 140 mg via INTRAVENOUS

## 2016-06-26 MED ORDER — BELLADONNA ALKALOIDS-OPIUM 16.2-60 MG RE SUPP
RECTAL | Status: DC | PRN
  Administered 2016-06-26: 1 via RECTAL

## 2016-06-26 MED ORDER — MEPERIDINE HCL 50 MG/ML IJ SOLN: 6.2500 mg | INTRAMUSCULAR | Status: DC | PRN

## 2016-06-26 SURGICAL SUPPLY — 16 items
BAG URO CATCHER STRL LF (MISCELLANEOUS) ×2 IMPLANT
BASKET STNLS GEMINI 4WIRE 3FR (BASKET) IMPLANT
BASKET ZERO TIP NITINOL 2.4FR (BASKET) ×1 IMPLANT
BSKT STON RTRVL GEM 120X11 3FR (BASKET)
BSKT STON RTRVL ZERO TP 2.4FR (BASKET) ×1
CATH INTERMIT  6FR 70CM (CATHETERS) ×2 IMPLANT
CATH URET DUAL LUMEN 6-10FR 50 (CATHETERS) ×2 IMPLANT
CLOTH BEACON ORANGE TIMEOUT ST (SAFETY) ×2 IMPLANT
FIBER LASER TRAC TIP (UROLOGICAL SUPPLIES) ×1 IMPLANT
GLOVE BIOGEL M STRL SZ7.5 (GLOVE) ×2 IMPLANT
GOWN STRL REUS W/TWL XL LVL3 (GOWN DISPOSABLE) ×2 IMPLANT
GUIDEWIRE STR DUAL SENSOR (WIRE) ×2 IMPLANT
MANIFOLD NEPTUNE II (INSTRUMENTS) ×2 IMPLANT
PACK CYSTO (CUSTOM PROCEDURE TRAY) ×2 IMPLANT
STENT URET 6FRX24 CONTOUR (STENTS) ×1 IMPLANT
TUBING CONNECTING 10 (TUBING) ×2 IMPLANT

## 2016-06-26 NOTE — Anesthesia Postprocedure Evaluation (Signed)
Anesthesia Post Note  Patient: [name]  Procedure(s) Performed: Procedure(s) (LRB): CYSTOSCOPY WITH RIGHT RETROGRADE PYELOGRAM, RIGHT URETEROSCOPY , HOLMIUM LASER, BASKET EXTRACTION AND STENT PLACEMENT (Right)  Patient location during evaluation: PACU Anesthesia Type: General Level of consciousness: awake and alert, oriented and patient cooperative Pain management: pain level controlled Vital Signs Assessment: post-procedure vital signs reviewed and stable Respiratory status: spontaneous breathing, nonlabored ventilation and respiratory function stable Cardiovascular status: blood pressure returned to baseline and stable Postop Assessment: no signs of nausea or vomiting Anesthetic complications: no    Last Vitals:  Vitals:   06/26/16 1440 06/26/16 1538  BP: 131/75 (!) 99/57  Pulse: 65 83  Resp: 12 16  Temp:      Last Pain:  Vitals:   06/26/16 1440  TempSrc:   PainSc: 0-No pain                 [name],E. Brynlyn Dade

## 2016-06-26 NOTE — Interval H&P Note (Signed)
History and Physical Interval Note:  06/26/2016 1:06 PM  Marc HartFrank Merle  has presented today for surgery, with the diagnosis of right distal ureteral stone  The various methods of treatment have been discussed with the patient and family. After consideration of risks, benefits and other options for treatment, the patient has consented to  Procedure(s): CYSTOSCOPY WITH RIGHT RETROGRADE PYELOGRAM, RIGHT URETEROSCOPY , HOLMIUM LASER, BASKET EXTRACTION AND STENT PLACEMENT (Right) as a surgical intervention .  The patient's history has been reviewed, patient examined, no change in status, stable for surgery.  I have reviewed the patient's chart and labs.  Questions were answered to the patient's satisfaction.     Zuri Lascala I Dannah Ryles Right side marked,  st

## 2016-06-26 NOTE — Anesthesia Procedure Notes (Signed)
Procedure Name: LMA Insertion Performed by: Azekiel Cremer J Pre-anesthesia Checklist: Patient identified, Emergency Drugs available, Suction available, Patient being monitored and Timeout performed Patient Re-evaluated:Patient Re-evaluated prior to inductionOxygen Delivery Method: Circle system utilized Preoxygenation: Pre-oxygenation with 100% oxygen Intubation Type: IV induction Ventilation: Mask ventilation without difficulty LMA: LMA inserted LMA Size: 4.0 Number of attempts: 1 Placement Confirmation: positive ETCO2,  CO2 detector and breath sounds checked- equal and bilateral Tube secured with: Tape Dental Injury: Teeth and Oropharynx as per pre-operative assessment        

## 2016-06-26 NOTE — Progress Notes (Signed)
Pt voided x2 within 30 minutes of arrival to short stay.  Urine is bloody, and penis is trickling blood after urination.  Bleeding stops when urination is complete.  Informed pt and his wife to expect this over the next several days.  Gave pt some pads for his underwear.  Pt up in room, moving around well, no lightheadedness or dizziness, pain is 0/10.  Pt reports burning with urination, but informed pt that is expected.  Pt has stent with no string, he and his wife informed to call office for follow up for stent removal.  They both voiced understanding.  Prescriptions for percocet and pyridium given to pt's wife.  Pt was d/c via wheelchair to lobby, wife is driving.

## 2016-06-26 NOTE — Discharge Instructions (Signed)
DISCHARGE INSTRUCTIONS FOR KIDNEY STONES OR URETERAL STENT  MEDICATIONS:   1. DO NOT RESUME YOUR ASPIRIN, or any other medicines like ibuprofen, motrin, excedrin, advil, aleve, vitamin E, fish oil as these can all cause bleeding x 7 days.  2. Resume all your other meds from home - except do not take any other pain meds that you may have at home.  ACTIVITY 1. No strenuous activity x 1week 2. No driving while on narcotic pain medications 3. Drink plenty of water 4. Continue to walk at home - you can still get blood clots when you are at home, so keep active, but don't over do it. 5. May return to work in 3 days.  BATHING 1. You can shower and we recommend daily showers  2. If you have a string coming from your urethra:  The stent string is attached to your ureteral stent.  Do not pull on this.   SIGNS/SYMPTOMS TO CALL: 1. Please call us if you have a fever greater than 101.5, uncontrolled  nausea/vomiting, uncontrolled pain, dizziness, unable to urinate, bloody urine, chest pain, shortness of breath, leg swelling, leg pain, redness around wound, drainage from wound, or any other concerns or questions.  You can reach us at [phone].  FOLLOW-UP You have an appointment: call [phone] for appointment for stent removal.    You may feel an odd sensation in your back. OK for occasional blood in the urine.

## 2016-06-26 NOTE — H&P (Signed)
}   Post-Operative Visit Report 06/12/2016    Marc HartFrank [name]         MRN: [id number]  PRIMARY CARE:  Marc SignsSean A. Everardo AllEllison, MD  DOB: 1946/03/31, 70 year old Male  REFERRING:  Marc Staron I. Patsi Searsannenbaum, MD  SSN: -[id number]  PROVIDER:  Jethro BolusSigmund Denise [name], M.D.    LOCATION:  [facility] [phone]    CC: I have kidney stones.(Surgery)  HPI: [name] is a 70 year-old male established patient who is here for renal calculi after a surgical intervention.  The problem is on the right side. He had eswl for treatment of his renal calculi. Patient denies stent, ureteroscopy, and percutaneous lithotomy. This procedure was done 03/26/2016. He did pass stone fragments.   He is currently having flank pain. He denies having back pain, groin pain, nausea, vomiting, fever, and chills.   Previous KUB showed a 6mm stone in R lower ureter. Pt has passed some tiny stone "dust".      ALLERGIES: None   MEDICATIONS: Finasteride 5 mg tablet  Metoprolol Succinate 25 mg tablet, extended release 24 hr  Tamsulosin Hcl 0.4 mg capsule, ext release 24 hr 1 capsule PO Daily  Acetaminophen Er 650 mg tablet, extended release  Atorvastatin Calcium 40 mg tablet  Clopidogrel 75 mg tablet  Fexofenadine Hcl 180 mg tablet  Gabapentin 400 mg capsule  Multi-Day Vitamins  Nitroglycerin 0.4 mg tablet, sublingual     REVIEW OF SYSTEMS:    GU Review Male:  Patient reports frequent urination, hard to postpone urination, get up at night to urinate, leakage of urine, stream starts and stops, and erection problems. Patient denies burning/ pain with urination, trouble starting your stream, have to strain to urinate , and penile pain.   Gastrointestinal (Upper):  Patient denies nausea, vomiting, and indigestion/ heartburn.   Gastrointestinal (Lower):  Patient denies diarrhea and constipation.   Constitutional:  Patient reports fatigue. Patient denies fever, night sweats, and weight loss.   Skin:  Patient reports itching. Patient  denies skin rash/ lesion.   Eyes:  Patient reports blurred vision. Patient denies double vision.   Ears/ Nose/ Throat:  Patient denies sore throat and sinus problems.   Hematologic/Lymphatic:  Patient reports easy bruising. Patient denies swollen glands.   Cardiovascular:  Patient denies leg swelling and chest pains.   Respiratory:  Patient reports shortness of breath. Patient denies cough.   Endocrine:  Patient denies excessive thirst.   Musculoskeletal:  Patient reports joint pain. Patient denies back pain.   Neurological:  lightheaded . Patient denies headaches and dizziness.   Psychologic:  Patient denies depression and anxiety.   VITAL [name]:      06/12/2016 02:38 PM    BP 91/58 mmHg    Pulse 153 /min    Temperature 97.8 F / 37 C    GU PHYSICAL EXAMINATION:     Anus and Perineum: No hemorrhoids. No anal stenosis. No rectal fissure, no anal fissure. No edema, no dimple, no perineal tenderness, no anal tenderness.    Scrotum: No lesions. No edema. No cysts. No warts.    Epididymides: Right: no spermatocele, no masses, no cysts, no tenderness, no induration, no enlargement. Left: no spermatocele, no masses, no cysts, no tenderness, no induration, no enlargement.    Testes: No tenderness, no swelling, no enlargement left testes. No tenderness, no swelling, no enlargement right testes. Normal location left testes. Normal location right testes. No mass, no cyst, no varicocele, no hydrocele left testes. No mass,  no cyst, no varicocele, no hydrocele right testes.    Urethral Meatus: Normal size. No lesion, no wart, no discharge, no polyp. Normal location.    Penis: Circumcised, no warts, no cracks. No dorsal Peyronie's plaques, no left corporal Peyronie's plaques, no right corporal Peyronie's plaques, no scarring, no warts. No balanitis, no meatal stenosis.    Prostate: 40 gram or 2+ size. Left lobe normal consistency, right lobe normal consistency. Symmetrical lobes. No prostate nodule. Left lobe  no tenderness, right lobe no tenderness.    Seminal Vesicles: Nonpalpable.    Sphincter Tone: Normal sphincter. No rectal tenderness. No rectal mass.     MULTI-SYSTEM PHYSICAL EXAMINATION:     Constitutional: Well-nourished. No physical deformities. Normally developed. Good grooming.    Neck: Neck symmetrical, not swollen. Normal tracheal position.    Respiratory: No labored breathing, no use of accessory muscles.     Cardiovascular: Normal temperature, normal extremity pulses, no swelling, no varicosities.    Lymphatic: No enlargement of neck, axillae, groin.    Skin: No paleness, no jaundice, no cyanosis. No lesion, no ulcer, no rash.    Neurologic / Psychiatric: Oriented to time, oriented to place, oriented to person. No depression, no anxiety, no agitation.    Gastrointestinal: No mass, no tenderness, no rigidity, non obese abdomen.    Eyes: Normal conjunctivae. Normal eyelids.    Ears, Nose, Mouth, and Throat: Left ear no scars, no lesions, no masses. Right ear no scars, no lesions, no masses. Nose no scars, no lesions, no masses. Normal hearing. Normal lips.    Musculoskeletal: Normal gait and station of head and neck.          PAST DATA REVIEWED:   Source Of History:  Patient  Records Review:  Previous Patient Records  X-Ray Review: KUB: Reviewed Films. Discussed With Patient. appears to show RL ureteral calcul;us-unchanged in location     06/12/16  Urinalysis  Urine Appearance Clear   Urine Color Yellow   Urine Glucose Neg   Urine Bilirubin Neg   Urine Ketones Neg   Urine Specific Gravity 1.025   Urine Blood Neg   Urine pH <=5.0   Urine Protein Neg   Urine Urobilinogen 0.2   Urine Nitrites Neg   Urine Leukocyte Esterase Neg    PROCEDURES:    KUB - 74000  A single view of the abdomen is obtained.          Urinalysis  Dipstick Dipstick Cont'd  Color: Yellow Bilirubin: Neg  Appearance: Clear Ketones: Neg  Specific Gravity: 1.025 Blood: Neg  pH: <=5.0  Protein: Neg  Glucose: Neg Urobilinogen: 0.2   Nitrites: Neg   Leukocyte Esterase: Neg    ASSESSMENT:     ICD-10 Details  1 GU:  Calculus Ureter - N20.1    Notes:  intermittent right flank pain, thought secondary to 6 mm right lower ureteral calculus. The patient and I have reviewed his KUB today. In order to be sure that the patient has a ureteral calculus, we will obtain an IV contrast only CT scan, looking for the ureteral calculus, to be sure it is not the right lower ureter, prior to causing him to have anesthesia for ureteroscopy. He is in agreement with this plan.    PLAN:   Orders    Schedule  X-Rays: 1 Week - C.T. Pelvis With I.V. Contrast - Next avail. Looking to see if stone is within Rt ureter.  Document  Letter(s):  Created for Patient: Clinical Summary  Notes:  Call pt with CT results.   Signed by [name], M.D. on 06/12/16 at 3:30 PM (EST)  The information contained in this medical record document is considered private and confidential patient information. This information can only be used for the medical diagnosis and/or medical services that are being provided by the patient's selected caregivers. This information can only be distributed outside of the patient's care if the patient agrees and [name] waivers of authorization for this information to be sent to an outside source or route.

## 2016-06-26 NOTE — Op Note (Signed)
Pre-operative diagnosis :  Right lower ureteral stone, 6mm  Postoperative diagnosis:  same  Operation: Cystourethroscopy, right retrograde pyelogram with interpretation, right ureteroscopy, with attempted basket extraction of identified 6 mm right ureterovesical junction stone, laser fractionation of right ureterovesical junction stone(200  fiber, laser settings : 0.5/15). Basket extraction of stone fragments. Placement of right double-J catheter, 6 French by 26 cm (no suture).  Surgeon:  Kathie RhodesS. Patsi Searsannenbaum, MD  First assistant: None  Anesthesia:  Gen. LMA  Preparation: After appropriate pre-anesthesia, the patient was brought the operating room, placed on the operating table in dorsal supine position where general LMA anesthesia was introduced. He was replaced in the dorsal lithotomy position with pubis was prepped with Betadine solution and draped in usual fashion. ER band was double checked. It had previously been marked on the right side. The right flank was previously marked as well. The history was reviewed.  Review history:       [name] is a 70 year-old male established patient who is here for renal calculi after a surgical intervention.  The problem is on the right side. He had eswl for treatment of his renal calculi. Patient denies stent, ureteroscopy, and percutaneous lithotomy. This procedure was done 03/26/2016. He did pass stone fragments.   He is currently having flank pain. He denies having back pain, groin pain, nausea, vomiting, fever, and chills.   Previous KUB showed a 6mm stone in R lower ureter. Pt has passed some tiny stone "dust".      Statement of  Likelihood of Success: Excellent. TIME-OUT observed.:  Procedure: Cystourethroscopy was accomplished. The urethra showed no evidence of stricture disease. The prostate was enlarged with bilobar BPH and the bladder neck was elevated. The trigone was in good position. There was clear reflux from the left ureteral orifice.  The right ureteral orifice was in normal position. There appeared to be clear reflux in the right ureteral orifice as well. Right retrograde Polygram showed a stone in the right lower ureter, at the level of the ureterovesical junction, consistent with the CT scan. The remaining ureter appeared mildly dilated. I did not see any further stone disease. A 0.038 guidewire was then passed around the stone, and into the renal pelvis.  The 6.5 JamaicaFrench double bridge ureteroscope was then passed through the right ureteral orifice, and through the right intramural ureter area the multilobulated right ureterovesical junction stone was identified. A 0 tip basket was placed around the stone, and the stone was attempted to be manipulated through the ureterovesical junction, but this was felt to be impossible. Therefore, with the stone trapped in the basket, the 200  laser fiber was passed through the left-sided laser port, and using laser settings of 0.5/15, the stone was fragmented. The largest fragment was then basket extracted. Other tiny fragments were evacuated into the bladder. The largest fragment was taken for evaluation.  Repeat ureteroscopy was accomplished and no other stone fragments were identified within the ureter. Because of edema of the intramural ureter, I elected to place a double-J stent. A 6 French by 24 cm double-J stent was then placed over the guidewire, and coiled in the renal pelvis, and in the bladder. No suture was left on the oval J stent.   The patient was given IV Toradol at the end of the procedure. At the beginning of the procedure, the patient was given IV Tylenol. He was also covered with IV Ancef.  He was awakened, taken to recovery room in good condition.

## 2016-06-26 NOTE — Anesthesia Preprocedure Evaluation (Signed)
Anesthesia Evaluation  Patient identified by MRN, date of birth, ID band Patient awake    Reviewed: Allergy & Precautions, NPO status , Patient's Chart, lab work & pertinent test results, reviewed documented beta blocker date and time   History of Anesthesia Complications Negative for: history of anesthetic complications  Airway Mallampati: I  TM Distance: >3 FB Neck ROM: Full    Dental  (+) Dental Advisory Given   Pulmonary former smoker (quit 1994),    breath sounds clear to auscultation       Cardiovascular hypertension, Pt. on medications and Pt. on home beta blockers + CAD and + CABG  + dysrhythmias Atrial Fibrillation  Rhythm:Regular Rate:Normal     Neuro/Psych negative neurological ROS     GI/Hepatic negative GI ROS, Neg liver ROS,   Endo/Other  Morbid obesity  Renal/GU negative Renal ROS     Musculoskeletal  (+) Arthritis ,   Abdominal (+) + obese,   Peds  Hematology  (+) Blood dyscrasia (plavix), ,   Anesthesia Other Findings   Reproductive/Obstetrics                             Anesthesia Physical Anesthesia Plan  ASA: III  Anesthesia Plan: General   Post-op Pain Management:    Induction: Intravenous  Airway Management Planned: LMA  Additional Equipment:   Intra-op Plan:   Post-operative Plan:   Informed Consent: I have reviewed the patients History and Physical, chart, labs and discussed the procedure including the risks, benefits and alternatives for the proposed anesthesia with the patient or authorized representative who has indicated his/her understanding and acceptance.   Dental advisory given  Plan Discussed with: CRNA and Surgeon  Anesthesia Plan Comments: (Plan routine monitors, GA- LMA OK)        Anesthesia Quick Evaluation

## 2016-06-26 NOTE — Transfer of Care (Signed)
Immediate Anesthesia Transfer of Care Note  Patient: [name]  Procedure(s) Performed: Procedure(s): CYSTOSCOPY WITH RIGHT RETROGRADE PYELOGRAM, RIGHT URETEROSCOPY , HOLMIUM LASER, BASKET EXTRACTION AND STENT PLACEMENT (Right)  Patient Location: PACU  Anesthesia Type:General  Level of Consciousness: awake, alert  and oriented  Airway & Oxygen Therapy: Patient Spontanous Breathing and Patient connected to face mask oxygen  Post-op Assessment: Report given to RN and Post -op Vital signs reviewed and stable  Post vital signs: Reviewed and stable  Last Vitals:  Vitals:   06/26/16 1133  BP: 99/68  Pulse: 77  Resp: 16  Temp: 36.7 C    Last Pain:  Vitals:   06/26/16 1133  TempSrc: Oral      Patients Stated Pain Goal: 4 (06/26/16 1149)  Complications: No apparent anesthesia complications

## 2016-07-04 DIAGNOSIS — N200 Calculus of kidney: Secondary | ICD-10-CM | POA: Diagnosis not present

## 2016-07-17 ENCOUNTER — Other Ambulatory Visit: Payer: Self-pay

## 2016-07-17 NOTE — Telephone Encounter (Signed)
I do not see that we filled this last, entered under historical provider

## 2016-07-17 NOTE — Progress Notes (Signed)
[name] D.O. [facility] [address] [place], [place] [address] Phone: [phone] Subjective:    I'm seeing this patient by the request  of:  [name], MD   CC: Left hip pain Follow-up  BJY:NWGNFAOZHYHPI:Subjective  Marc HartFrank [name] is a 71 y.o. male coming in with complaint of left hip pain. Has been multiple months.Been greater than 3 months at this time. Patient unfortunately didn't have a CABG procedure done October 25. Has been starting to increase activity with cardiac rehabilitation. Patient states  Pain seems to be more bilateral. Patient states that it also seems to be going across his back. An states that when he walks he has some radiation going down both legs. Denies any weakness. Patient states that he is having difficulty even with walking greater than 300 feet. Patient states in he needs to start be more active since his heart surgery but is finding difficult secondary to Patient's last x-rays of his) 2016. These were independently visualized by me showing only mild osteophytic changes.  Past Medical History:  Diagnosis Date  . Arthritis   . CAD, multiple vessel 03/2105   LM 65%, mLAD 70%, 90% & 80%, o-pCx 75%, mCx 95% & d Cx 70%, OM1 60%, mRCA 90% with 100% dRCA. R-R collaterals  . Chronic distal aortic occlusion (HCC)    By report  . Chronic kidney disease   . COPD (chronic obstructive pulmonary disease) (HCC)   . Depression   . Dysrhythmia    hx. Atrial Fibrillation- Dr. Herbie BaltimoreHarding, cardiology  . Enlarged prostate   . Erectile dysfunction   . Essential hypertension   . History of kidney stones    multiple lithotripsies  . Hyperlipidemia LDL goal <70   . Hypogonadism in male    Previously on testosterone injections, but has not yet restarted until further testing  . Muscle pain   . PAF (paroxysmal atrial fibrillation) (HCC) 03/2015   Noted per-cath & post-op CABG.  . Peripheral vascular disease (HCC)    Bilateral ABIs 0.8  . Renal stones 03/2016  . S/P CABG  x 5 04/04/2015   LIMA to LAD, SVG to DIAG, SVG to OM, seqSVG -PDA- dCx  . SOB (shortness of breath)   . 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 Weakness    weakness bilateral legs, s/p vein harvesting for CABPG   Past Surgical History:  Procedure Laterality Date  . BLEPHAROPLASTY    . CARDIAC CATHETERIZATION  1992  . CARDIAC CATHETERIZATION N/A 03/31/2015   Procedure: Left Heart Cath and Coronary Angiography;  Surgeon: Marykay Lexavid W Harding, MD;  [facility];  Service: Cardiovascular;  Laterality: N/A;  . CHOLECYSTECTOMY  2000   also had revision of hernia repair  . CORONARY ARTERY BYPASS GRAFT N/A 04/04/2015   Procedure: CORONARY ARTERY BYPASS GRAFTING (CABG);  Surgeon: Delight OvensEdward B Gerhardt, MD;  [facility];  Service: Open Heart Surgery;  Laterality: N/A;  . CYSTECTOMY     face and scrotum  . CYSTOSCOPY WITH RETROGRADE PYELOGRAM, URETEROSCOPY AND STENT PLACEMENT Right 06/26/2016   Procedure: CYSTOSCOPY WITH RIGHT RETROGRADE PYELOGRAM, RIGHT URETEROSCOPY , HOLMIUM LASER, BASKET EXTRACTION AND STENT PLACEMENT;  Surgeon: Jethro BolusSigmund Tannenbaum, MD;  Location: [facility] ORS;  Service: Urology;  Laterality: Right;  . Eugenie BirksLexiscan Myoview  June 2010   [facility] due to inability to reach target heart rate) --the study was normal with no evidence of ischemia and normal LV  function. EF 7  . LITHOTRIPSY    . TEE WITHOUT CARDIOVERSION N/A 04/04/2015   Procedure: TRANSESOPHAGEAL ECHOCARDIOGRAM (TEE);  Surgeon: [name], MD;  Location: [facility];  Service: Open Heart Surgery;  Laterality: N/A;  . VASECTOMY  1975  . VENTRAL HERNIA REPAIR  1994   complicated by infection   Social History   Social History  . Marital status: Married    Spouse name: N/A  . Number of children: N/A  . Years of education: N/A   Occupational History  . Not on file.   Social History Main Topics  . Smoking status: Former Smoker    Packs/day: 2.00     Years: 40.00    Types: Cigarettes    Quit date: 10/17/1992  . Smokeless tobacco: Never Used     Comment: quit 1994  . Alcohol use 0.0 oz/week     Comment: seldom  . Drug use: No  . Sexual activity: No   Other Topics Concern  . Not on file   Social History Narrative   Lives in [place] with wife. No pets   Two children.      Former smoker. Quit in 1994. Prior to quitting was unable to walk without exertional dyspnea.      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      Hobbies - reading, drives Corvette, chuch   No Known Allergies Family History  Problem Relation Age of Onset  . Coronary artery disease Father   . Heart disease Father   . Stroke Mother   . Stroke Sister   . Prostate cancer Neg Hx   . Kidney disease Neg Hx   . Other Neg Hx     hypogonadism      Past medical history, social, surgical and family history all reviewed in electronic medical record.   Review of Systems: No headache, visual changes, nausea, vomiting, diarrhea, constipation, dizziness, abdominal pain, skin rash, fevers, chills, night sweats, weight loss, swollen lymph nodes, body aches, joint swelling, muscle aches, chest pain, shortness of breath, mood changes.    Objective  There were no vitals taken for this visit.  Systems examined below as of 07/18/16 General: NAD A&O x3 mood, affect normal  HEENT: Pupils equal, extraocular movements intact no nystagmus Respiratory: not short of breath at rest or with speaking Cardiovascular: No lower extremity edema, non tender Skin: Warm dry intact with no signs of infection or rash on extremities or on axial skeleton. Abdomen: Soft nontender, no masses Neuro: Cranial nerves  intact, neurovascularly intact in all extremities with 2+ DTRs and 2+ pulses. Lymph: No lymphadenopathy appreciated today  Gait normal with good balance and coordination.  MSK: Non tender with full range  of motion and good stability and symmetric strength and tone of shoulders, elbows, wrist,  knee and ankles bilaterally.  Arthritic changes of multiple joints Hip: Left ROM IR: 15 Deg, ER: 25 Deg, Flexion: 100 Deg, Extension: 100 Deg, Abduction: 45 Deg, Adduction: 15 Deg Strength IR: 4/5, ER: 5/5, Flexion: 5/5, Extension: 5/5, Abduction: 4+/5, Adduction: 5/5 Pelvic alignment unremarkable to inspection and palpation. Standing hip rotation and gait without trendelenburg sign / unsteadiness. Mild discomfort over the greater trochanter. No tenderness over piriformis  Positive Faber bilaterally Discomfort in the paraspinal musculature of the lumbar region bilaterally patient also has pain over the sacroiliac joints bilaterally    Procedure note 97110; 15 minutes spent for Therapeutic exercises  as stated in above notes.  This included exercises focusing on stretching, strengthening, with significant focus on eccentric aspects. Low back exercises that included:  Pelvic tilt/bracing instruction to focus on control of the pelvic girdle and lower abdominal muscles  Glute strengthening exercises, focusing on proper firing of the glutes without engaging the low back muscles Proper stretching techniques for maximum relief for the hamstrings, hip flexors, low back and some rotation where tolerated    Proper technique shown and discussed handout in great detail with ATC.  All questions were discussed and answered.      Impression and Recommendations:     This case required medical decision making of moderate complexity.

## 2016-07-18 ENCOUNTER — Encounter: Payer: Self-pay | Admitting: Family Medicine

## 2016-07-18 ENCOUNTER — Ambulatory Visit (INDEPENDENT_AMBULATORY_CARE_PROVIDER_SITE_OTHER): Payer: Medicare Other | Admitting: Family Medicine

## 2016-07-18 ENCOUNTER — Ambulatory Visit (INDEPENDENT_AMBULATORY_CARE_PROVIDER_SITE_OTHER)
Admission: RE | Admit: 2016-07-18 | Discharge: 2016-07-18 | Disposition: A | Discharge status: Home / Self Care | Payer: Medicare Other | Source: Ambulatory Visit | Attending: Family Medicine | Admitting: Family Medicine

## 2016-07-18 VITALS — BP 110/72 | HR 73 | Ht 69.0 in | Wt 219.0 lb

## 2016-07-18 DIAGNOSIS — M7062 Trochanteric bursitis, left hip: Secondary | ICD-10-CM | POA: Diagnosis not present

## 2016-07-18 DIAGNOSIS — M5416 Radiculopathy, lumbar region: Secondary | ICD-10-CM | POA: Diagnosis not present

## 2016-07-18 DIAGNOSIS — M7061 Trochanteric bursitis, right hip: Secondary | ICD-10-CM | POA: Diagnosis not present

## 2016-07-18 DIAGNOSIS — M5136 Other intervertebral disc degeneration, lumbar region: Secondary | ICD-10-CM | POA: Diagnosis not present

## 2016-07-18 MED ORDER — GABAPENTIN 100 MG PO CAPS: 100.0000 mg | ORAL_CAPSULE | Freq: Two times a day (BID) | ORAL | 3 refills | Status: DC

## 2016-07-18 MED ORDER — PREDNISONE 20 MG PO TABS: 20.0000 mg | ORAL_TABLET | Freq: Every day | ORAL | 0 refills | Status: DC

## 2016-07-18 NOTE — Assessment & Plan Note (Signed)
Concern patient is going to have more of a degenerative disc disease of the lumbar spine. We discussed icing regimen and home exercises. Patient will get x-rays today. Low dose of prednisone given. Avoid anti-inflammatories secondary to patient's recent heart issues. Discussed icing regimen. Work with Event organiserathletic trainer to learn home exercises. Follow-up again in 2 weeks. At that time we'll either consider further imaging of the back or consider injections in the greater trochanteric area.

## 2016-07-18 NOTE — Patient Instructions (Addendum)
Good to see you  Gustavus Bryantce is your friend. We will get xray of your back today  Gabapentin 100mg  in AM, 100mg  in PM and 400mg  at night  Prednisone 20 mg daily for 5 days.  Ice 20 minutes 2 times daily. Usually after activity and before bed. Exercises 3 times a week.  See me again in 2 weeks, if not a lot better we will try injection in the side of the hip or we will consider looking at the back in closer detail.

## 2016-07-18 NOTE — Telephone Encounter (Signed)
Please determine what he takes this medication for. Thanks.

## 2016-07-19 ENCOUNTER — Other Ambulatory Visit: Payer: Self-pay | Admitting: *Deleted

## 2016-07-19 MED ORDER — GABAPENTIN 400 MG PO CAPS: 400.0000 mg | ORAL_CAPSULE | Freq: Every evening | ORAL | 0 refills | Status: DC

## 2016-07-19 NOTE — Telephone Encounter (Signed)
Refill for gabapentin 400mg  sent to pharmacy.

## 2016-07-19 NOTE — Telephone Encounter (Signed)
Noted and agree. 

## 2016-07-19 NOTE — Telephone Encounter (Signed)
FYI: Patient is taking for muscle relaxation, he states this is managed by Frazier Richardsr.[name] and the dosing was just recently changed. Informed patient that since another provider is managing this he will need to fill this. Informed him that we have no record of quantity and new dosing schedule. Informed patient to let us know if he has any issues getting refills from him.

## 2016-07-24 ENCOUNTER — Telehealth: Payer: Self-pay | Admitting: Family Medicine

## 2016-07-24 NOTE — Telephone Encounter (Signed)
[facility] called and is requesting a 90 day refill on pt's gabapentin (NEURONTIN) 400 MG capsule. Please advise, thank you!  Pharmacy - [facility] [place], [place] - [id number]

## 2016-07-24 NOTE — Telephone Encounter (Signed)
This is being prescribed by Dr.Zachary Katrinka BlazingSmith

## 2016-07-30 ENCOUNTER — Ambulatory Visit (INDEPENDENT_AMBULATORY_CARE_PROVIDER_SITE_OTHER): Payer: Medicare Other | Admitting: Family Medicine

## 2016-07-30 ENCOUNTER — Encounter: Payer: Self-pay | Admitting: Family Medicine

## 2016-07-30 DIAGNOSIS — I2510 Atherosclerotic heart disease of native coronary artery without angina pectoris: Secondary | ICD-10-CM

## 2016-07-30 DIAGNOSIS — N200 Calculus of kidney: Secondary | ICD-10-CM | POA: Diagnosis not present

## 2016-07-30 DIAGNOSIS — M7062 Trochanteric bursitis, left hip: Secondary | ICD-10-CM | POA: Diagnosis not present

## 2016-07-30 DIAGNOSIS — J432 Centrilobular emphysema: Secondary | ICD-10-CM | POA: Diagnosis not present

## 2016-07-30 DIAGNOSIS — M954 Acquired deformity of chest and rib: Secondary | ICD-10-CM

## 2016-07-30 NOTE — Patient Instructions (Addendum)
Nice to see you. I would suggest continued follow-up with her pulmonologist and cardiologist for your COPD and coronary artery disease. If you have trouble breathing, chest pain, or any new or changing symptoms please seek medical attention immediately.

## 2016-07-30 NOTE — Assessment & Plan Note (Signed)
Doing well status post stenting and treatment for kidney stone. No hematuria. He'll follow-up with urology as scheduled.

## 2016-07-30 NOTE — Assessment & Plan Note (Signed)
Related to malunion of the sternum following CABG. Is unchanged. Occasionally gets a pinching sensation. Has follow-up with his surgeon early in February.

## 2016-07-30 NOTE — Assessment & Plan Note (Signed)
Suspect patient's COPD is likely contributing to his intermittent dyspnea. Discussed providing him with a rescue inhaler today though he has declined this and opted to follow-up with pulmonology. He notes he will contact them for an appointment. Given return precautions.

## 2016-07-30 NOTE — Assessment & Plan Note (Signed)
Continues to have intermittent issues with this though overall improved after injection and increasing his gabapentin dose. Also exercises have been helpful. He'll continue to follow with sports medicine for this.

## 2016-07-30 NOTE — Progress Notes (Signed)
Pre visit review using our clinic review tool, if applicable. No additional management support is needed unless otherwise documented below in the visit note. 

## 2016-07-30 NOTE — Assessment & Plan Note (Addendum)
Patient overall doing well. No anginal symptoms. I suspect his intermittent rare dyspnea is multifactorial with regards to his COPD and deconditioning and obesity. These symptoms are stable and unchanged from the last time he saw his cardiologist. Discussed following up with his cardiologist for this and monitoring for change in symptoms. Given return precautions.

## 2016-07-30 NOTE — Progress Notes (Signed)
  Marc AlarEric Kynlei Piontek, MD Phone: [phone]  [name] is a 71 y.o. male who presents today for follow-up.  Patient recently treated for kidney stone with lithotripsy and then laser treatment. This was followed by stent. He notes no hematuria. He notes he has recovered well from this. Sees urology in follow-up soon.  Following with sports medicine for bilateral hip pain. For greater trochanter bursitis. Has undergone injections with good benefit. Also increased his gabapentin at night. Doing exercises. Notes laying on his side hurts. Also discomfort in the lateral aspect of his greater trochanter with walking. Has follow-up with Dr. Katrinka BlazingSmith later this week.  Patient is status post CABG. Notes he is overall doing well. He notes no anginal symptoms. Did have malunion of his sternotomy and occasionally gets a pinching sensation. Also intermittently feels a bruising sensation where his vein harvest was in his chest. He gets no exertional chest pain. Occasionally gets some shortness of breath that lasts for 1 minute at most and then it resolves on its own. He is able to exert himself most of time with no issues. This has been stable and has been followed by cardiology and he is seeing pulmonology. He notes no cough or wheezing. They tried to place him on an inhaler though his insurance would not cover them. He notes no orthopnea or PND. He is hesitant to use an inhaler.  PMH: Former smoker   ROS see history of present illness  Objective  Physical Exam Vitals:   07/30/16 1334  BP: 118/70  Pulse: 71  Temp: 97.9 F (36.6 C)    BP Readings from Last 3 Encounters:  07/30/16 118/70  07/18/16 110/72  06/26/16 (!) 99/57   Wt Readings from Last 3 Encounters:  07/30/16 218 lb (98.9 kg)  07/18/16 219 lb (99.3 kg)  06/26/16 215 lb (97.5 kg)    Physical Exam  Constitutional: No distress.  Cardiovascular: Normal rate, regular rhythm and normal heart sounds.   Pulmonary/Chest: Effort normal and  breath sounds normal. He exhibits tenderness (mild discomfort on palpation of left medial pectoral muscle).  Musculoskeletal: He exhibits no edema.  Mild tenderness on bilateral greater trochanters at hips  Neurological: He is alert. Gait normal.  Skin: He is not diaphoretic.     Assessment/Plan: Please see individual problem list.  Nephrolithiasis Doing well status post stenting and treatment for kidney stone. No hematuria. He'll follow-up with urology as scheduled.  Greater trochanteric bursitis of left hip Continues to have intermittent issues with this though overall improved after injection and increasing his gabapentin dose. Also exercises have been helpful. He'll continue to follow with sports medicine for this.  CAD, multiple vessel Patient overall doing well. No anginal symptoms. I suspect his intermittent rare dyspnea is multifactorial with regards to his COPD and deconditioning and obesity. These symptoms are stable and unchanged from the last time he saw his cardiologist. Discussed following up with his cardiologist for this and monitoring for change in symptoms. Given return precautions.  COPD (chronic obstructive pulmonary disease) (HCC) Suspect patient's COPD is likely contributing to his intermittent dyspnea. Discussed providing him with a rescue inhaler today though he has declined this and opted to follow-up with pulmonology. He notes he will contact them for an appointment. Given return precautions.  Chest wall deformity Related to malunion of the sternum following CABG. Is unchanged. Occasionally gets a pinching sensation. Has follow-up with his surgeon early in February.   Marc AlarEric Tashanna Dolin, MD [facility]

## 2016-07-31 ENCOUNTER — Encounter: Payer: Self-pay | Admitting: Family Medicine

## 2016-07-31 DIAGNOSIS — H524 Presbyopia: Secondary | ICD-10-CM | POA: Diagnosis not present

## 2016-07-31 DIAGNOSIS — H43393 Other vitreous opacities, bilateral: Secondary | ICD-10-CM | POA: Diagnosis not present

## 2016-07-31 DIAGNOSIS — H02834 Dermatochalasis of left upper eyelid: Secondary | ICD-10-CM | POA: Diagnosis not present

## 2016-07-31 DIAGNOSIS — H04123 Dry eye syndrome of bilateral lacrimal glands: Secondary | ICD-10-CM | POA: Diagnosis not present

## 2016-07-31 DIAGNOSIS — H2513 Age-related nuclear cataract, bilateral: Secondary | ICD-10-CM | POA: Diagnosis not present

## 2016-08-02 ENCOUNTER — Ambulatory Visit: Payer: Medicare Other | Admitting: Family Medicine

## 2016-08-03 ENCOUNTER — Other Ambulatory Visit: Payer: Self-pay

## 2016-08-03 MED ORDER — METOPROLOL TARTRATE 25 MG PO TABS: 12.5000 mg | ORAL_TABLET | Freq: Two times a day (BID) | ORAL | 3 refills | Status: DC

## 2016-08-03 MED ORDER — CLOPIDOGREL BISULFATE 75 MG PO TABS: 75.0000 mg | ORAL_TABLET | Freq: Every day | ORAL | 3 refills | Status: DC

## 2016-08-03 NOTE — Telephone Encounter (Signed)
Sent to pharmacy 

## 2016-08-03 NOTE — Telephone Encounter (Signed)
Last filled 06/21/15 bu DR.harding

## 2016-08-13 NOTE — Progress Notes (Signed)
[name] [address] [place], [place] [address] Phone: [phone] Subjective:    I'm seeing this patient by the request  of:  [name], MD   CC: Left hip pain Follow-up  FAO:[id number]  [name] is a 71 y.o. male coming in with complaint of left hip pain. Has been multiple months.Been greater than 3 months at this time. Patient unfortunately didn't have a CABG procedure done October 25. Has been starting to increase activity with cardiac rehabilitation Patient is having worsening bilateral hip pain. Has been seen for greater trochanteric but there is a possibility of patient having lumbar radiculopathy with the amount of arthritis has been seen previously. We discussed with patient. States that the pain is severe enough that is waking her up at night. Denies any weakness though. Patient states though that the pain is starting to stop him from activities as well.  Past Medical History:  Diagnosis Date  . Arthritis   . CAD, multiple vessel 03/2105   LM 65%, mLAD 70%, 90% & 80%, o-pCx 75%, mCx 95% & d Cx 70%, OM1 60%, mRCA 90% with 100% dRCA. R-R collaterals  . Chronic distal aortic occlusion (HCC)    By report  . Chronic kidney disease   . COPD (chronic obstructive pulmonary disease) (HCC)   . Depression   . Dysrhythmia    hx. Atrial Fibrillation- Dr. [name], cardiology  . Enlarged prostate   . Erectile dysfunction   . Essential hypertension   . History of kidney stones    multiple lithotripsies  . Hyperlipidemia LDL goal <70   . Hypogonadism in male    Previously on testosterone injections, but has not yet restarted until further testing  . Muscle pain   . PAF (paroxysmal atrial fibrillation) (HCC) 03/2015   Noted per-cath & post-op CABG.  . Peripheral vascular disease (HCC)    Bilateral ABIs 0.8  . Renal stones 03/2016  . S/P CABG x 5 04/04/2015   LIMA to LAD, SVG to DIAG, SVG to OM, seqSVG -PDA- dCx  . SOB (shortness of breath)     . 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 Weakness    weakness bilateral legs, s/p vein harvesting for CABPG   Past Surgical History:  Procedure Laterality Date  . BLEPHAROPLASTY    . CARDIAC CATHETERIZATION  1992  . CARDIAC CATHETERIZATION N/A 03/31/2015   Procedure: Left Heart Cath and Coronary Angiography;  Surgeon: Marykay Lex, MD;  Location: [facility] INVASIVE CV LAB;  Service: Cardiovascular;  Laterality: N/A;  . CHOLECYSTECTOMY  2000   also had revision of hernia repair  . CORONARY ARTERY BYPASS GRAFT N/A 04/04/2015   Procedure: CORONARY ARTERY BYPASS GRAFTING (CABG);  Surgeon: [name], MD;  Location: [facility] OR;  Service: Open Heart Surgery;  Laterality: N/A;  . CYSTECTOMY     face and scrotum  . CYSTOSCOPY WITH RETROGRADE PYELOGRAM, URETEROSCOPY AND STENT PLACEMENT Right 06/26/2016   Procedure: CYSTOSCOPY WITH RIGHT RETROGRADE PYELOGRAM, RIGHT URETEROSCOPY , HOLMIUM LASER, BASKET EXTRACTION AND STENT PLACEMENT;  Surgeon: Jethro Bolus, MD;  Location: [facility] ORS;  Service: Urology;  Laterality: Right;  . Eugenie Birks Myoview  June 2010   converted to [facility] from treadmill due to inability to reach target heart rate) --the study was normal with no evidence of ischemia and normal LV function. EF 7  . LITHOTRIPSY    . TEE WITHOUT CARDIOVERSION N/A 04/04/2015   Procedure: TRANSESOPHAGEAL  ECHOCARDIOGRAM (TEE);  Surgeon: Delight OvensEdward B Gerhardt, MD;  [facility];  Service: Open Heart Surgery;  Laterality: N/A;  . VASECTOMY  1975  . VENTRAL HERNIA REPAIR  1994   complicated by infection   Social History   Social History  . Marital status: Married    Spouse name: N/A  . Number of children: N/A  . Years of education: N/A   Occupational History  . Not on file.   Social History Main Topics  . Smoking status: Former Smoker    Packs/day: 2.00    Years: 40.00    Types: Cigarettes    Quit date: 10/17/1992  . Smokeless tobacco: Never Used      Comment: quit 1994  . Alcohol use 0.0 oz/week     Comment: seldom  . Drug use: No  . Sexual activity: No   Other Topics Concern  . Not on file   Social History Narrative   [place]ives in [place]. No pets   Two children.      Former smoker. Quit in 1994. Prior to quitting was unable to walk without exertional dyspnea.      Work - Retired. 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      Hobbies - reading, drives Corvette, chuch   No Known Allergies Family History  Problem Relation Age of Onset  . Coronary artery disease Father   . Heart disease Father   . Stroke Mother   . Stroke Sister   . Prostate cancer Neg Hx   . Kidney disease Neg Hx   . Other Neg Hx     hypogonadism      Past medical history, social, surgical and family history all reviewed in electronic medical record.   Review of Systems: No headache, visual changes, nausea, vomiting, diarrhea, constipation, dizziness, abdominal pain, skin rash, fevers, chills, night sweats, weight loss, swollen lymph nodes, chest pain, shortness of breath, mood changes.     Objective  Blood pressure 116/68, pulse 71, height 5\' 9"  (1.753 m), weight 218 lb (98.9 kg), SpO2 98 %.  Systems examined below as of 08/14/16 General: NAD A&O x3 mood, affect normal  HEENT: Pupils equal, extraocular movements intact no nystagmus Respiratory: not short of breath at rest or with speaking Cardiovascular: No lower extremity edema, non tender Skin: Warm dry intact with no signs of infection or rash on extremities or on axial skeleton. Abdomen: Soft nontender, no masses Neuro: Cranial nerves  intact, neurovascularly intact in all extremities with 2+ DTRs and 2+ pulses. Lymph: No lymphadenopathy appreciated today  Gait Antalgic gait which is worse. MSK: Non tender with full range of motion and good stability and symmetric strength and tone of shoulders, elbows, wrist,  knee and ankles  bilaterally.  Arthritic changes of multiple joints Hip: Bilateral ROM IR: 25 Deg, ER: 35 Deg, Flexion: 120 Deg, Extension: 100 Deg, Abduction: 45 Deg, Adduction: 45 Deg Strength IR: 5/5, ER: 5/5, Flexion: 5/5, Extension: 5/5, Abduction: 5/5, Adduction: 5/5 Worsening pain noted over the greater trochanteric areas bilaterally. Patient does have limited range of motion of the back for flexion of 30 extension of 5. Mild positive straight leg test left greater then right.   Procedure: Real-time Ultrasound Guided Injection of left  greater trochanteric bursitis secondary to patient's body habitus Device: GE Logiq Q7  Ultrasound guided injection is preferred based studies that show increased duration, increased effect, greater accuracy, decreased procedural pain, increased response  rate, and decreased cost with ultrasound guided versus blind injection.  Verbal informed consent obtained.  Time-out conducted.  Noted no overlying erythema, induration, or other signs of local infection.  Skin prepped in a sterile fashion.  Local anesthesia: Topical Ethyl chloride.  With sterile technique and under real time ultrasound guidance:  Greater trochanteric area was visualized and patient's bursa was noted. A 22-gauge 3 inch needle was inserted and 4 cc of 0.5% Marcaine and 1 cc of Kenalog 40 mg/dL was injected. Pictures taken Completed without difficulty  Pain immediately resolved suggesting accurate placement of the medication.  Advised to call if fevers/chills, erythema, induration, drainage, or persistent bleeding.  Images permanently stored and available for review in the ultrasound unit.  Impression: Technically successful ultrasound guided injection.   Procedure: Real-time Ultrasound Guided Injection of right greater trochanteric bursitis secondary to patient's body habitus Device: GE Logiq Q7 Ultrasound guided injection is preferred based studies that show increased duration, increased effect,  greater accuracy, decreased procedural pain, increased response rate, and decreased cost with ultrasound guided versus blind injection.  Verbal informed consent obtained.  Time-out conducted.  Noted no overlying erythema, induration, or other signs of local infection.  Skin prepped in a sterile fashion.  Local anesthesia: Topical Ethyl chloride.  With sterile technique and under real time ultrasound guidance:  Greater trochanteric area was visualized and patient's bursa was noted. A 22-gauge 3 inch needle was inserted and 4 cc of 0.5% Marcaine and 1 cc of Kenalog 40 mg/dL was injected. Pictures taken Completed without difficulty  Pain immediately resolved suggesting accurate placement of the medication.  Advised to call if fevers/chills, erythema, induration, drainage, or persistent bleeding.  Images permanently stored and available for review in the ultrasound unit.  Impression: Technically successful ultrasound guided injection.      Impression and Recommendations:     This case required medical decision making of moderate complexity.

## 2016-08-14 ENCOUNTER — Ambulatory Visit: Payer: Self-pay

## 2016-08-14 ENCOUNTER — Encounter: Payer: Self-pay | Admitting: Family Medicine

## 2016-08-14 ENCOUNTER — Ambulatory Visit (INDEPENDENT_AMBULATORY_CARE_PROVIDER_SITE_OTHER): Payer: Medicare Other | Admitting: Family Medicine

## 2016-08-14 VITALS — BP 116/68 | HR 71 | Ht 69.0 in | Wt 218.0 lb

## 2016-08-14 DIAGNOSIS — M25552 Pain in left hip: Principal | ICD-10-CM

## 2016-08-14 DIAGNOSIS — M5416 Radiculopathy, lumbar region: Secondary | ICD-10-CM

## 2016-08-14 DIAGNOSIS — M25551 Pain in right hip: Secondary | ICD-10-CM | POA: Diagnosis not present

## 2016-08-14 DIAGNOSIS — M7062 Trochanteric bursitis, left hip: Secondary | ICD-10-CM

## 2016-08-14 DIAGNOSIS — M7061 Trochanteric bursitis, right hip: Secondary | ICD-10-CM | POA: Diagnosis not present

## 2016-08-14 DIAGNOSIS — I2510 Atherosclerotic heart disease of native coronary artery without angina pectoris: Secondary | ICD-10-CM | POA: Diagnosis not present

## 2016-08-14 NOTE — Patient Instructions (Signed)
Go to see you  We injected both hips today and I hope it helps Continue everything else you are doing  I can repeat every 3 months if needed Otherwise if no improvement write me and we will get MRI of your back.

## 2016-08-14 NOTE — Assessment & Plan Note (Signed)
Injected today. Worsening symptoms. Patient did have near complete resolution of pain after injection. Hopefully this will be beneficial. Lumbar radiculopathy still within the differential. Discussed with positive straight leg test that if any worsening symptoms advance imaging including an MRI will be necessary.

## 2016-08-14 NOTE — Assessment & Plan Note (Signed)
Also improvement.  Continue with HEP If no improvement will look at back.

## 2016-08-22 ENCOUNTER — Other Ambulatory Visit: Payer: Self-pay | Admitting: Cardiothoracic Surgery

## 2016-08-22 DIAGNOSIS — R351 Nocturia: Secondary | ICD-10-CM | POA: Diagnosis not present

## 2016-08-22 DIAGNOSIS — R350 Frequency of micturition: Secondary | ICD-10-CM | POA: Diagnosis not present

## 2016-08-22 DIAGNOSIS — I25119 Atherosclerotic heart disease of native coronary artery with unspecified angina pectoris: Secondary | ICD-10-CM

## 2016-08-22 DIAGNOSIS — N401 Enlarged prostate with lower urinary tract symptoms: Secondary | ICD-10-CM | POA: Diagnosis not present

## 2016-08-22 DIAGNOSIS — N200 Calculus of kidney: Secondary | ICD-10-CM | POA: Diagnosis not present

## 2016-08-23 ENCOUNTER — Ambulatory Visit (INDEPENDENT_AMBULATORY_CARE_PROVIDER_SITE_OTHER): Payer: Medicare Other | Admitting: Cardiothoracic Surgery

## 2016-08-23 ENCOUNTER — Ambulatory Visit
Admission: RE | Admit: 2016-08-23 | Discharge: 2016-08-23 | Disposition: A | Discharge status: Home / Self Care | Payer: Medicare Other | Source: Ambulatory Visit | Attending: Cardiothoracic Surgery | Admitting: Cardiothoracic Surgery

## 2016-08-23 ENCOUNTER — Encounter: Payer: Self-pay | Admitting: Cardiothoracic Surgery

## 2016-08-23 VITALS — BP 116/75 | HR 73 | Resp 16 | Ht 69.0 in | Wt 216.0 lb

## 2016-08-23 DIAGNOSIS — I2510 Atherosclerotic heart disease of native coronary artery without angina pectoris: Secondary | ICD-10-CM

## 2016-08-23 DIAGNOSIS — Z951 Presence of aortocoronary bypass graft: Secondary | ICD-10-CM | POA: Diagnosis not present

## 2016-08-23 DIAGNOSIS — I25119 Atherosclerotic heart disease of native coronary artery with unspecified angina pectoris: Secondary | ICD-10-CM

## 2016-08-23 DIAGNOSIS — R072 Precordial pain: Secondary | ICD-10-CM | POA: Diagnosis not present

## 2016-08-23 DIAGNOSIS — M9689 Other intraoperative and postprocedural complications and disorders of the musculoskeletal system: Secondary | ICD-10-CM

## 2016-08-23 NOTE — Progress Notes (Signed)
[address]       [place], [id number]             [phone]      Essie HartFrank [facility] Record #[id number] Date of Birth: 1946-06-01  Referring: Marykay LexHarding, David W, MD Primary Care: [name], MD  Chief Complaint:   POST OP FOLLOW UP 9/21/2016SURGICAL PROCEDURE: Coronary artery bypass grafting x5 with the left internal mammary to the left anterior descending coronary artery, reverse saphenous vein graft to the diagonal coronary artery, reverse saphenous vein graft to intramyocardial obtuse marginal, and sequential reverse saphenous vein graft to the posterior descending and distal circumflex with right greater saphenous endovein harvesting thigh and calf, and left thigh greater saphenous vein endoscopic harvest.  History of Present Illness:     Patient  From a cardiac standpoint the patient has done well since this bypass surgery, returned to near-normal activities. He has noted shortness of breath, his preop PFTs showed an FEV1 of 60% of normal. He notes some claudication in his hips and thighs with ambulation.  He currently has no symptoms from his sternum.   Past Medical History:  Diagnosis Date  . Arthritis   . CAD, multiple vessel 03/2105   LM 65%, mLAD 70%, 90% & 80%, o-pCx 75%, mCx 95% & d Cx 70%, OM1 60%, mRCA 90% with 100% dRCA. R-R collaterals  . Chronic distal aortic occlusion (HCC)    By report  . Chronic kidney disease   . COPD (chronic obstructive pulmonary disease) (HCC)   . Depression   . Dysrhythmia    hx. Atrial Fibrillation- Dr. Herbie BaltimoreHarding, cardiology  . Enlarged prostate   . Erectile dysfunction   . Essential hypertension   . History of kidney stones    multiple lithotripsies  . Hyperlipidemia LDL goal <70   . Hypogonadism in male    Previously on testosterone injections, but has not yet restarted until further testing  . Muscle pain   . PAF (paroxysmal atrial fibrillation) (HCC) 03/2015   Noted per-cath & post-op  CABG.  . Peripheral vascular disease (HCC)    Bilateral ABIs 0.8  . Renal stones 03/2016  . S/P CABG x 5 04/04/2015   LIMA to LAD, SVG to DIAG, SVG to OM, seqSVG -PDA- dCx  . SOB (shortness of breath)   . 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 Weakness    weakness bilateral legs, s/p vein harvesting for CABPG     History  Smoking Status  . Former Smoker  . Packs/day: 2.00  . Years: 40.00  . Types: Cigarettes  . Quit date: 10/17/1992  Smokeless Tobacco  . Never Used    Comment: quit 1994    History  Alcohol Use  . 0.0 oz/week    Comment: seldom     No Known Allergies  Current Outpatient Prescriptions  Medication Sig Dispense Refill  . acetaminophen (TYLENOL) 650 MG CR tablet Take 650 mg by mouth every 8 (eight) hours as needed for pain. Reported on 01/03/2016    . atorvastatin (LIPITOR) 40 MG tablet Take 1 tablet (40 mg total) by mouth daily. (Patient taking differently: Take 40 mg by mouth every evening. ) 90 tablet 3  . clopidogrel (PLAVIX) 75 MG tablet Take 1 tablet (75 mg total) by mouth daily. 90 tablet 3  . finasteride (PROSCAR) 5 MG tablet Take 1 tablet (5 mg total) by mouth daily. 90 tablet 0  .  gabapentin (NEURONTIN) 100 MG capsule Take 1 capsule (100 mg total) by mouth 2 (two) times daily. 60 capsule 3  . gabapentin (NEURONTIN) 400 MG capsule Take 1 capsule (400 mg total) by mouth every evening. 90 capsule 0  . metoprolol tartrate (LOPRESSOR) 25 MG tablet Take 0.5 tablets (12.5 mg total) by mouth 2 (two) times daily. 90 tablet 3  . NITROSTAT 0.4 MG SL tablet Place 0.4 mg under the tongue every 5 (five) minutes as needed for chest pain. Reported on 01/03/2016    . tamsulosin (FLOMAX) 0.4 MG CAPS capsule Take 0.4 mg by mouth daily.      No current facility-administered medications for this visit.        Physical Exam: BP 116/75 (BP Location: Left Arm, Patient Position: Sitting, Cuff Size: Large)   Pulse 73   Resp 16    Ht 5\' 9"  (1.753 m)   Wt 216 lb (98 kg)   SpO2 95% Comment: RA  BMI 31.90 kg/m   General appearance: alert, cooperative and no distress Neurologic: intact Heart: regular rate and rhythm, S1, S2 normal, no murmur, click, rub or gallop Lungs: clear to auscultation bilaterally Abdomen: soft, non-tender; bowel sounds normal; no masses,  no organomegaly Extremities: extremities normal, atraumatic, no cyanosis or edema Wound: sternal incision, intact, there is no discernible movement of the sternum on exam.   Diagnostic Studies & Laboratory data:     Recent Radiology Findings:  Ct Chest W Contrast  Result Date: 01/23/2016 CLINICAL DATA:  Sternal pain for 1 month. Occasional dyspnea on exertion. EXAM: CT CHEST WITH CONTRAST TECHNIQUE: Multidetector CT imaging of the chest was performed during intravenous contrast administration. CONTRAST:  75 cc Isovue 370 intravenously. COMPARISON:  Chest radiograph 05/16/2015 FINDINGS: Mediastinum/Lymph Nodes: The thyroid gland is somewhat heterogeneous in appearance with subtle 6 mm hypoattenuated nodule in the right thyroid lobe. The heart is mildly enlarged. Heavy calcified atherosclerotic disease of the coronary arteries and the visualized portion of the aorta are noted. No masses, pathologically enlarged lymph nodes, or other significant abnormality. Patient is status post CABG. There is a chronic nonunion of the sternum at the median sternotomy without evidence of erosive bony changes or focal fluid collection at the surgical line. The left sternoclavicular joint is widened to 10 mm, for comparison the right sternoclavicular joint measures 5 mm. Foci of gas also noted within the left sternoclavicular joint. There are no associated osteolytic changes. Lungs/Pleura: There is moderate to severe upper lobe predominant centrilobular emphysema. There is a 4 mm perifissural ground-glass nodule in the right middle lobe. Upper abdomen: There is a partially visualized 6 mm  stone within the proximal right ureter causing mild hydroureter upstream without significant hydronephrosis. Bilateral nephrolithiasis is noted. There are sub cm left renal cysts. Musculoskeletal: No chest wall mass or suspicious bone lesions identified. IMPRESSION: Widening of the left sternoclavicular joint, which contains foci of gas, without associated osteolytic changes. This may represent osteoarthritis with prominent vacuum phenomenon, however discitis may have a similar appearance. Status post CABG with chronic nonunion of the sternum at the median sternotomy line, without evidence of erosive bony changes or wall abscess formation. Enlarged heart. Severe coronary artery and aortic calcific atherosclerotic disease. Moderate to severe emphysema, with 4 mm nonspecific ground-glass nodule in the right middle lobe. No follow-up needed if patient is low-risk. Non-contrast chest CT can be considered in 12 months if patient is high-risk. This recommendation follows the consensus statement: Guidelines for Management of Incidental Pulmonary Nodules Detected on  CT Images:From the Fleischner Society 2017; published online before print (10.1148/radiol.[id number]). Partially visualized 6 mm stone within the proximal right ureter, causing mild right hydroureter, which may be partially obstructing. Bilateral nephrolithiasis. These results will be called to the ordering clinician or representative by the Radiologist Assistant, and communication documented in the PACS or zVision Dashboard. Electronically Signed   By: Ted Mcalpine M.D.   On: 01/23/2016 15:59   US Thyroid  Result Date: 02/07/2016 CLINICAL DATA:  Thyroid nodules seen on CT scan EXAM: THYROID ULTRASOUND TECHNIQUE: Ultrasound examination of the thyroid gland and adjacent soft tissues was performed. COMPARISON:  Chest CT - 01/23/2016 FINDINGS: There is mild diffuse heterogeneity of thyroid parenchymal echotexture. Right thyroid lobe Measurements: Normal  in size measuring 5.1 x 1.7 x 2.0 cm. Right, mid - 0.7 cm - mixed echogenic, solid - this nodule likely correlates with the nodule seen on preceding chest CT. Left thyroid lobe Measurements: Normal in size measuring 5.1 x 1.7 x 1.9 cm. Left, mid, anterior - 0.7 cm - mixed echogenic, solid Left, inferior - 0.6 cm - mixed echogenic, solid Isthmus Thickness: Normal in size measures 0.3 cm in diameter No discrete nodule or mass identified within the thyroid isthmus. Lymphadenopathy None visualized. IMPRESSION: Findings suggestive of multi nodular goiter. None of the discretely measured up punctate (sub cm) nodules currently meet imaging criteria to recommend percutaneous sampling. This recommendation follows the consensus statement: Management of Thyroid Nodules Detected at [place]: Society of Radiologists in Ultrasound Consensus Conference Statement. Radiology 2005; [id number]. Electronically Signed   By: Simonne Come M.D.   On: 02/07/2016 23:40    Recent Lab Findings: Lab Results  Component Value Date   WBC 8.0 06/25/2016   HGB 14.7 06/25/2016   HCT 43.1 06/25/2016   PLT 171 06/25/2016   GLUCOSE 132 (H) 06/25/2016   CHOL 126 06/29/2015   TRIG 191.0 (H) 06/29/2015   HDL 34.30 (L) 06/29/2015   LDLCALC 53 06/29/2015   ALT 22 01/11/2016   AST 18 01/11/2016   NA 139 06/25/2016   K 4.0 06/25/2016   CL 105 06/25/2016   CREATININE 0.96 06/25/2016   BUN 21 (H) 06/25/2016   CO2 26 06/25/2016   TSH 2.32 11/18/2015   INR 1.52 (H) 04/04/2015   HGBA1C 5.9 12/23/2015   PFT's: pre op FEV1 1.92 61% Interpretation: The FVC, FEV1, FEV1/FVC ratio and FEF25-75% are reduced indicating airway obstruction. The slow vital capacity is reduced. Conclusions: Pulmonary Function Diagnosis: Moderate Obstructive Airways Disease  Assessment / Plan:      Status post coronary artery bypass grafting,Sternum is stable on exam - I plan to see the patient back as needed   Moderate to severe emphysema on ct scan/  Moderate  Obstructive Airways Disease BY PFT'S   I discussed with the patient and his wife the CT findings of nonunion of the sternum following coronary artery bypass grafting without major instability or fractured wires. The current nonunion should not interfere with obtaining pulmonary function studies or any treatment of his kidney stones, which are his 2 major concerns. I discussed with him rewiring/ plating of the sternum, Especially if he has increasing discomfort , currently is not interested in any reoperation on the sternum , if he has increasing discomfort he will call otherwise I will see him back in 6 months .  [name] MD      [address] [place] [place] [address] Office [phone]   Beeper [phone]  08/23/2016 12:37 PM

## 2016-09-10 ENCOUNTER — Ambulatory Visit (INDEPENDENT_AMBULATORY_CARE_PROVIDER_SITE_OTHER): Payer: Medicare Other | Admitting: Pulmonary Disease

## 2016-09-10 ENCOUNTER — Encounter: Payer: Self-pay | Admitting: Pulmonary Disease

## 2016-09-10 ENCOUNTER — Other Ambulatory Visit: Payer: Self-pay | Admitting: Family Medicine

## 2016-09-10 VITALS — BP 120/72 | HR 81 | Ht 70.0 in | Wt 217.0 lb

## 2016-09-10 DIAGNOSIS — Z87891 Personal history of nicotine dependence: Secondary | ICD-10-CM

## 2016-09-10 DIAGNOSIS — J432 Centrilobular emphysema: Secondary | ICD-10-CM | POA: Diagnosis not present

## 2016-09-10 DIAGNOSIS — I2510 Atherosclerotic heart disease of native coronary artery without angina pectoris: Secondary | ICD-10-CM

## 2016-09-10 DIAGNOSIS — R0600 Dyspnea, unspecified: Secondary | ICD-10-CM

## 2016-09-10 DIAGNOSIS — E669 Obesity, unspecified: Secondary | ICD-10-CM

## 2016-09-10 DIAGNOSIS — R0609 Other forms of dyspnea: Secondary | ICD-10-CM | POA: Diagnosis not present

## 2016-09-10 DIAGNOSIS — J449 Chronic obstructive pulmonary disease, unspecified: Secondary | ICD-10-CM

## 2016-09-10 DIAGNOSIS — N400 Enlarged prostate without lower urinary tract symptoms: Secondary | ICD-10-CM

## 2016-09-10 MED ORDER — UMECLIDINIUM-VILANTEROL 62.5-25 MCG/INH IN AEPB: 1.0000 | INHALATION_SPRAY | Freq: Every day | RESPIRATORY_TRACT | 0 refills | Status: AC

## 2016-09-10 NOTE — Patient Instructions (Addendum)
Trial on and off Anoro inhaler - one week on, one week off, repeat  Follow up in 4-6 weeks to assess response to Anoro inhaler and we will decide whether to continue it or try something else

## 2016-09-10 NOTE — Progress Notes (Signed)
PULMONARY OFFICE FOLLOW UP NOTE  PT IDENTIFICATION: 71 y.o. M former smoker with mild-mod COPD, previously seen be Dr Dema SeverinMungal  PROBLEMS:  Former smoker - quit 1994 COPD - mild to moderate by PFTs CAD - S/P CABG Tyrone Sage([facility]) 03/2015 Incidental finding of lung nodule on CT chest 01/23/16 - very small and likely benign  DATA: Spirometry 04/01/15: FEV1 1.92 liters (61% pred), FVC 2.97 L (70% pred) Spirometry 04/16/16: FEV1 1.99 L (64% pred), 2.95 L (70% pred) CT chest 01/23/16: Moderate to severe emphysema, with 4 mm nonspecific ground-glass nodule in the right middle lobe (low likelihood of malignancy)  INTERVAL HISTORY: No major events  SUBJ: This is a routine reevaluation. Patient has no new complaints. He continues to have class II dyspnea. He thinks that this is perhaps slightly worse over the last year. There is little day-to-day variation in his level of dyspnea. There is no seasonal component to his respiratory problems. He has been tried on bronchodilator therapy in the past and was unable to discern any benefit. Denies CP, fever, purulent sputum, hemoptysis, LE edema and calf tenderness   OBJ: Vitals:   09/10/16 1422  BP: 120/72  Pulse: 81  SpO2: 97%  Weight: 217 lb (98.4 kg)  Height: 5\' 10"  (1.778 m)   Pleasant, well-developed well-nourished, no apparent distress.  HEENT are all within normal limits.  Neck is supple without adenopathy or jugular venous distention. Chest examination reveals mildly diminished breath sounds without wheezes or other adventitious sounds.  Cardiac exam reveals a regular rate and rhythm with no murmurs.  Abdomen is soft, nontender, with normal bowel sounds.  Extremities are without clubbing cyanosis or edema.  Neurologic exam is grossly intact.   DATA: Chest x-ray from 08/23/2016 reveals no acute findings. There is mild hyperlucency in both upper lobes suggestive of emphysema.  IMPRESSION: COPD, moderate (HCC)  Centrilobular emphysema  (HCC)  DOE (dyspnea on exertion)  Former smoker  Mild obesity    PLAN: We discussed the possible contribution that his mild to moderate obesity is making to his class II exertional dyspnea. We discussed strategies of weight loss through dietary changes.  Trial on and off of on Anoro inhaler-1 week on, one-week off, repeat to assess whether he has a response to bronchodilator therapy.  Follow-up in 4-6 weeks to assess response to Anoro and we will decide whether to continue it or try something else  He would likely be a good candidate for pulmonary rehabilitation and this should be considered in the future   Billy Fischeravid Henya Aguallo, MD PCCM service Mobile [phone] Pager [phone] 09/12/2016

## 2016-09-17 ENCOUNTER — Other Ambulatory Visit: Payer: Self-pay | Admitting: *Deleted

## 2016-09-17 MED ORDER — GABAPENTIN 100 MG PO CAPS: 100.0000 mg | ORAL_CAPSULE | Freq: Two times a day (BID) | ORAL | 1 refills | Status: DC

## 2016-10-02 ENCOUNTER — Other Ambulatory Visit: Payer: Self-pay

## 2016-10-02 ENCOUNTER — Other Ambulatory Visit: Payer: Self-pay | Admitting: Family Medicine

## 2016-10-02 ENCOUNTER — Telehealth: Payer: Self-pay | Admitting: Family Medicine

## 2016-10-02 NOTE — Telephone Encounter (Signed)
Pt called back and stated it is the 400mg  gabapentin that was denied.  He last picked it up in January.

## 2016-10-02 NOTE — Telephone Encounter (Signed)
Pt called regarding his gabapentin refill. He states the pharmacy requested the refill but was denied. He states it was the 100, but I explained to him the 100 was filled earlier this month and the 400 was filled in January.  Can you please give him a call

## 2016-10-02 NOTE — Telephone Encounter (Signed)
Now patient is asking if new scripts can just be sent to the [facility] instead of [facility].  He states he is frustrated with [facility].  Please see all notes.

## 2016-10-03 ENCOUNTER — Other Ambulatory Visit: Payer: Self-pay

## 2016-10-03 MED ORDER — GABAPENTIN 400 MG PO CAPS: 400.0000 mg | ORAL_CAPSULE | Freq: Every evening | ORAL | 1 refills | Status: DC

## 2016-10-03 NOTE — Telephone Encounter (Signed)
Gabapentin 400 mg has been refilled to [facility]

## 2016-10-04 NOTE — Telephone Encounter (Signed)
Pt aware.

## 2016-10-16 DIAGNOSIS — N200 Calculus of kidney: Secondary | ICD-10-CM | POA: Diagnosis not present

## 2016-10-16 DIAGNOSIS — E291 Testicular hypofunction: Secondary | ICD-10-CM | POA: Diagnosis not present

## 2016-10-16 DIAGNOSIS — N401 Enlarged prostate with lower urinary tract symptoms: Secondary | ICD-10-CM | POA: Diagnosis not present

## 2016-10-16 DIAGNOSIS — R351 Nocturia: Secondary | ICD-10-CM | POA: Diagnosis not present

## 2016-10-16 DIAGNOSIS — R350 Frequency of micturition: Secondary | ICD-10-CM | POA: Diagnosis not present

## 2016-10-22 ENCOUNTER — Encounter: Payer: Self-pay | Admitting: Pulmonary Disease

## 2016-10-22 ENCOUNTER — Ambulatory Visit (INDEPENDENT_AMBULATORY_CARE_PROVIDER_SITE_OTHER): Payer: Medicare Other | Admitting: Pulmonary Disease

## 2016-10-22 VITALS — BP 124/78 | HR 91 | Wt 217.0 lb

## 2016-10-22 DIAGNOSIS — J449 Chronic obstructive pulmonary disease, unspecified: Secondary | ICD-10-CM

## 2016-10-22 DIAGNOSIS — E669 Obesity, unspecified: Secondary | ICD-10-CM

## 2016-10-22 DIAGNOSIS — I2510 Atherosclerotic heart disease of native coronary artery without angina pectoris: Secondary | ICD-10-CM

## 2016-10-22 DIAGNOSIS — J432 Centrilobular emphysema: Secondary | ICD-10-CM

## 2016-10-22 DIAGNOSIS — R0609 Other forms of dyspnea: Secondary | ICD-10-CM

## 2016-10-22 DIAGNOSIS — R0600 Dyspnea, unspecified: Secondary | ICD-10-CM

## 2016-10-22 DIAGNOSIS — Z87891 Personal history of nicotine dependence: Secondary | ICD-10-CM

## 2016-10-22 NOTE — Progress Notes (Signed)
PULMONARY OFFICE FOLLOW UP NOTE  PT IDENTIFICATION: 71 y.o. M former smoker with mild-mod COPD, previously seen be Dr Dema Severin  PROBLEMS:  Former smoker - quit 1994 COPD - mild to moderate by PFTs CAD - S/P CABG Tyrone Sage) 03/2015 Incidental finding of lung nodule on CT chest 01/23/16 - very small and likely benign  DATA: Spirometry 04/01/15: FEV1 1.92 liters (61% pred), FVC 2.97 L (70% pred) Spirometry 04/16/16: FEV1 1.99 L (64% pred), 2.95 L (70% pred) CT chest 01/23/16: Moderate to severe emphysema, with 4 mm nonspecific ground-glass nodule in the right middle lobe (low likelihood of malignancy)  INTERVAL HISTORY: No major events  SUBJ: This is a routine reevaluation. Patient has no new complaints. Anoro did not make any difference in his shortness of breath or other respiratory symptoms. He continues to have mild to moderate exertional dyspnea.Denies CP, fever, purulent sputum, hemoptysis, LE edema and calf tenderness.   OBJ: Vitals:   10/22/16 1602 10/22/16 1603  BP:  124/78  Pulse:  91  SpO2:  98%  Weight: 98.4 kg (217 lb)    NAD HEENT WNL  No JVD Breath sounds mildly diminished, no wheezes  Regular with no murmurs  Abdomen is soft, nontender, with normal bowel sounds.  Extremities are without clubbing cyanosis or edema.    DATA: No new labs or chest x-ray  IMPRESSION: Former smoker  Moderate COPD (chronic obstructive pulmonary disease) (HCC)  Centrilobular emphysema (HCC)  DOE (dyspnea on exertion)  Mild obesity  He has not had any significant favorable response to multiple controller medicines including Breo and Anoro. Therefore, and is likely that his airway obstruction is fixed with little reversibility  PLAN: -Albuterol inhaler - use as needed for increased shortness of breath, cough, wheezing or chest tightness  -Continue to work on weight loss - we discussed strategies area mostly, he needs to avoid snack foods and sweets  -Follow up as  needed   Billy Fischer, MD PCCM service Mobile [phone] Pager [phone] 10/22/2016

## 2016-10-22 NOTE — Patient Instructions (Signed)
Albuterol inhaler - use as needed for increased shortness of breath, cough, wheezing or chest tightness  Continue to work on weight loss  Follow up as needed

## 2016-10-23 ENCOUNTER — Encounter: Payer: Self-pay | Admitting: Cardiology

## 2016-10-23 ENCOUNTER — Ambulatory Visit (INDEPENDENT_AMBULATORY_CARE_PROVIDER_SITE_OTHER): Payer: Medicare Other | Admitting: Cardiology

## 2016-10-23 ENCOUNTER — Telehealth: Payer: Self-pay | Admitting: Pulmonary Disease

## 2016-10-23 VITALS — BP 102/54 | HR 77 | Ht 69.0 in | Wt 218.4 lb

## 2016-10-23 DIAGNOSIS — I209 Angina pectoris, unspecified: Secondary | ICD-10-CM

## 2016-10-23 DIAGNOSIS — I2510 Atherosclerotic heart disease of native coronary artery without angina pectoris: Secondary | ICD-10-CM | POA: Diagnosis not present

## 2016-10-23 DIAGNOSIS — R0600 Dyspnea, unspecified: Secondary | ICD-10-CM

## 2016-10-23 DIAGNOSIS — I480 Paroxysmal atrial fibrillation: Secondary | ICD-10-CM

## 2016-10-23 DIAGNOSIS — R5382 Chronic fatigue, unspecified: Secondary | ICD-10-CM | POA: Diagnosis not present

## 2016-10-23 DIAGNOSIS — E785 Hyperlipidemia, unspecified: Secondary | ICD-10-CM

## 2016-10-23 DIAGNOSIS — I25119 Atherosclerotic heart disease of native coronary artery with unspecified angina pectoris: Secondary | ICD-10-CM

## 2016-10-23 DIAGNOSIS — I70211 Atherosclerosis of native arteries of extremities with intermittent claudication, right leg: Secondary | ICD-10-CM | POA: Diagnosis not present

## 2016-10-23 DIAGNOSIS — R0609 Other forms of dyspnea: Secondary | ICD-10-CM

## 2016-10-23 DIAGNOSIS — I10 Essential (primary) hypertension: Secondary | ICD-10-CM | POA: Diagnosis not present

## 2016-10-23 DIAGNOSIS — I739 Peripheral vascular disease, unspecified: Secondary | ICD-10-CM | POA: Diagnosis not present

## 2016-10-23 MED ORDER — ALBUTEROL SULFATE HFA 108 (90 BASE) MCG/ACT IN AERS: 2.0000 | INHALATION_SPRAY | RESPIRATORY_TRACT | 6 refills | Status: DC | PRN

## 2016-10-23 NOTE — Patient Instructions (Addendum)
SCHEDULE [place] OFFICE Your physician has requested that you have an ankle brachial index (ABI). During this test an ultrasound and blood pressure cuff are used to evaluate the arteries that supply the arms and legs with blood. Allow thirty minutes for this exam. There are no restrictions or special instructions.  Your physician has requested that you have a lower extremity arterial duplex. This test is an ultrasound of the arteries in the legs. It looks at arterial blood flow in the legs. Allow one hour for Lower and Upper Arterial scans. There are no restrictions or special instructions  Your physician has requested that you have an abdominal aorta duplex. During this test, an ultrasound is used to evaluate the aorta. Allow 30 minutes for this exam. Do not eat after midnight the day before and avoid carbonated beverages   PLEASE TAKE LABSLIP TO PRIMARY TO HAVE DONE WITH THEIRS   Your physician wants you to follow-up in 6 MONTHS WITH DR HARDING.You will receive a reminder letter in the mail two months in advance. If you don't receive a letter, please call our office to schedule the follow-up appointment.  If you need a refill on your cardiac medications before your next appointment, please call your pharmacy.

## 2016-10-23 NOTE — Telephone Encounter (Signed)
RX for Albuterol sent. Nothing further needed. LM informing pt.

## 2016-10-23 NOTE — Progress Notes (Signed)
PCP: [name], MD  Clinic Note: Chief Complaint  Patient presents with  . Shortness of Breath    pt states walking up hills   . Coronary Artery Disease  . Atrial Fibrillation    HPI: [name] is a 71 y.o. male with a PMH below who presents today for Six-month follow-up of CAD-CABG and PAD along with A. fib. - Has some sternal nonunion following CABG.  [name] was last seen on October 2017 - He was mostly noticing some exertional dyspnea and fatigue. In the interim, he has been reassessed as far as his nonunion and the plan is to watch it. He also has been shown to have significant hypogonadism and is being started on testosterone therapy.  Recent Hospitalizations: None  Studies Reviewed:   November 2017: 30 day event monitor showed PVCs, but no evidence of recurrent A. fib  Interval History: [name] returns today overall doing fairly well from cardiac standpoint, he just feels tired and fatigued with no energy at all. As long as walking on flat ground, he denies any dyspnea or chest tightness/pressure, however if he is walking here in [place] where there is some mild pill, he will get short of breath. He also notes that his legs get tired and fatigued with walking from his hips down to his knees. This usually goes Away at rest. He's been told he has very low testosterone levels, and is now starting back on replacement therapy. He really states that it's more fatigue and shortness of breath. He also has gained quite a bit of weight recently. No PND, orthopnea or edema. No resting or exertional chest tightness/pressure. No resting dyspnea. No rapid irregular heartbeats or palpitations to suggest recurrence of A. fib. No syncope/near-syncope or TIAs amaurosis fugax.  No melena, hematochezia, hematuria, or epstaxis.  He just saw Dr. Sung Amabile from Pulmonary medicine - he's been started on some inhalers. Told to work on weight loss  ROS: A comprehensive was  performed. Review of Systems  Constitutional: Positive for malaise/fatigue.  HENT: Positive for congestion.   Respiratory: Positive for shortness of breath. Negative for cough.   Cardiovascular:       Per history of present illness  Musculoskeletal: Positive for joint pain. Negative for falls.  Neurological: Positive for dizziness (A little bit dizzy today. I). Negative for focal weakness and weakness.  Endo/Heme/Allergies: Positive for environmental allergies.  Psychiatric/Behavioral: Negative for depression and memory loss. The patient is not nervous/anxious.   All other systems reviewed and are negative.   Past Medical History:  Diagnosis Date  . Arthritis   . CAD, multiple vessel 03/2105   LM 65%, mLAD 70%, 90% & 80%, o-pCx 75%, mCx 95% & d Cx 70%, OM1 60%, mRCA 90% with 100% dRCA. R-R collaterals  . Chronic distal aortic occlusion (HCC)    By report  . Chronic kidney disease   . COPD (chronic obstructive pulmonary disease) (HCC)   . Depression   . Enlarged prostate   . Erectile dysfunction   . Essential hypertension   . History of kidney stones    multiple lithotripsies  . Hyperlipidemia LDL goal <70   . Hypogonadism in male    Previously on testosterone injections, but has not yet restarted until further testing  . Muscle pain   . PAF (paroxysmal atrial fibrillation) (HCC) 03/2015   Noted per-cath & post-op CABG.  Dr. [name], cardiology  . Peripheral vascular disease (HCC)    Bilateral ABIs 0.8  . Renal stones  03/2016  . S/P CABG x 5 04/04/2015   LIMA to LAD, SVG to DIAG, SVG to OM, seqSVG -PDA- dCx  . SOB (shortness of breath)   . 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 Weakness    weakness bilateral legs, s/p vein harvesting for CABPG    Past Surgical History:  Procedure Laterality Date  . BLEPHAROPLASTY    . CARDIAC CATHETERIZATION  1992  . CARDIAC CATHETERIZATION N/A 03/31/2015   Procedure: Left Heart Cath and  Coronary Angiography;  Surgeon: Marykay Lex, MD;  Location: [facility] INVASIVE CV LAB;  Service: Cardiovascular;  Laterality: N/A;  . CHOLECYSTECTOMY  2000   also had revision of hernia repair  . CORONARY ARTERY BYPASS GRAFT N/A 04/04/2015   Procedure: CORONARY ARTERY BYPASS GRAFTING (CABG);  Surgeon: [name], MD;  Location[facility] OR;  Service: Open Heart Surgery;  Laterality: N/A;  . CYSTECTOMY     face and scrotum  . CYSTOSCOPY WITH RETROGRADE PYELOGRAM, URETEROSCOPY AND STENT PLACEMENT Right 06/26/2016   Procedure: CYSTOSCOPY WITH RIGHT RETROGRADE PYELOGRAM, RIGHT URETEROSCOPY , HOLMIUM LASER, BASKET EXTRACTION AND STENT PLACEMENT;  Surgeon: Jethro Bolus, MD;  Location: [facility] ORS;  Service: Urology;  Laterality: Right;  . Eugenie Birks Myoview  June 2010   converted to [facility] from treadmill due to inability to reach target heart rate) --the study was normal with no evidence of ischemia and normal LV function. EF 7  . LITHOTRIPSY    . TEE WITHOUT CARDIOVERSION N/A 04/04/2015   Procedure: TRANSESOPHAGEAL ECHOCARDIOGRAM (TEE);  Surgeon: [name], MD;  Location: [facility] OR;  Service: Open Heart Surgery;  Laterality: N/A;  . VASECTOMY  1975  . VENTRAL HERNIA REPAIR  1994   complicated by infection    Current Meds  Medication Sig  . acetaminophen (TYLENOL) 650 MG CR tablet Take 650 mg by mouth every 8 (eight) hours as needed for pain. Reported on 01/03/2016  . atorvastatin (LIPITOR) 40 MG tablet Take 1 tablet (40 mg total) by mouth daily. (Patient taking differently: Take 40 mg by mouth every evening. )  . clopidogrel (PLAVIX) 75 MG tablet Take 1 tablet (75 mg total) by mouth daily.  . finasteride (PROSCAR) 5 MG tablet TAKE 1 TABLET EVERY DAY  . gabapentin (NEURONTIN) 100 MG capsule Take 1 capsule (100 mg total) by mouth 2 (two) times daily.  Marland Kitchen gabapentin (NEURONTIN) 400 MG capsule Take 1 capsule (400 mg total) by mouth every evening.  . metoprolol tartrate (LOPRESSOR) 25 MG tablet Take  0.5 tablets (12.5 mg total) by mouth 2 (two) times daily.  Marland Kitchen NITROSTAT 0.4 MG SL tablet Place 0.4 mg under the tongue every 5 (five) minutes as needed for chest pain. Reported on 01/03/2016  . tamsulosin (FLOMAX) 0.4 MG CAPS capsule Take 0.4 mg by mouth daily.     No Known Allergies  Social History   Social History  . Marital status: Married    Spouse name: N/A  . Number of children: N/A  . Years of education: N/A   Social History Main Topics  . Smoking status: Former Smoker    Packs/day: 2.00    Years: 40.00    Types: Cigarettes    Quit date: 10/17/1992  . Smokeless tobacco: Never Used     Comment: quit 1994  . Alcohol use 0.0 oz/week     Comment: seldom  . Drug use: No  . Sexual activity: No   Other Topics Concern  .  None   Social History Narrative   Lives in [place] with wife. No pets   Two children.      Former smoker. Quit in 1994. Prior to quitting was unable to walk without exertional dyspnea.      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      Hobbies - reading, drives Corvette, chuch    family history includes Coronary artery disease in his father; Heart disease in his father; Stroke in his mother and sister.  Wt Readings from Last 3 Encounters:  10/23/16 218 lb 6.4 oz (99.1 kg)  10/22/16 217 lb (98.4 kg)  09/10/16 217 lb (98.4 kg)  was 212 in Oct 2017  PHYSICAL EXAM BP (!) 102/54   Pulse 77   Ht  (1.753 m)   Wt 218 lb 6.4 oz (99.1 kg)   BMI 32.25 kg/m  General appearance: alert, cooperative, appears stated age, no distress and Mildly obese Neck: no adenopathy, no carotid bruit and no JVD Lungs: clear to auscultation bilaterally, normal percussion bilaterally and non-labored Heart: regular rate and rhythm, S1& S2 normal, no murmur, click, rub or gallop; Nondisplaced PMI. Mild chest wall tenderness Abdomen: soft, non-tender; bowel sounds normal; no masses,  no organomegaly;  truncal obesity Extremities: extremities normal, atraumatic, no cyanosis, and edema trace, 1+ Pulses: 2+ and symmetric; -- diminished pedal pulses bilaterally Skin: mobility and turgor normal, no evidence of bleeding or bruising and no lesions noted  Neurologic: Mental status: Alert, oriented, thought content appropriate   Adult ECG Report  Rate: 77 ;  Rhythm: normal sinus rhythm and normal axis, intervals & durations;   Narrative Interpretation: Essentially normal EKG   Other studies Reviewed: Additional studies/ records that were reviewed today include:  Recent Labs:  Lab Results  Component Value Date   CHOL 126 06/29/2015   HDL 34.30 (L) 06/29/2015   LDLCALC 53 06/29/2015   TRIG 191.0 (H) 06/29/2015   CHOLHDL 4 06/29/2015    ASSESSMENT / PLAN: Problem List Items Addressed This Visit    Atherosclerosis of native artery of extremity (HCC)   Relevant Orders   EKG 12-Lead (Completed)   CAD, multiple vessel (Chronic)    Multivessel CAD status post CABG. Only able tolerate low-dose beta blocker along with statin and Plavix.      Relevant Orders   EKG 12-Lead (Completed)   Lipid panel   Comprehensive metabolic panel   VAS [place] ABI WITH/WO TBI   VAS [place] LOWER EXTREMITY ARTERIAL DUPLEX   AAA Duplex   Chronic fatigue    Quite likely related to low testosterone level. At this point it is probably the best option to simply treated so we can get back to being active.      Coronary artery disease involving native coronary artery with angina pectoris (HCC) (Chronic)    No active anginal symptoms. He does have some exertional dyspnea, especially related to his weight gain and low testosterone related fatigue.  He is only able to tolerate very low-dose beta blocker, and with his fatigue I'm reluctant to push anymore. He also has pretty low blood pressures. He is stable on Plavix and statin. Plavix as much for his CAD as it is for his reported PAD.  For now we'll plan to bend follow-up  Myoview in 2020, unless his symptoms of dyspnea worsened.      Relevant Orders   EKG 12-Lead (Completed)   VAS [place] ABI  WITH/WO TBI   VAS [place] LOWER EXTREMITY ARTERIAL DUPLEX   AAA Duplex   Dyslipidemia, goal LDL below 70 (Chronic)    By the last check ICD, he appeared to be a goal LDL on Lipitor but his HDL remains low which is the main risk factor for him. I have not seen labs since December 2016 however.  we will go ahead and recheck lipid panel and comprehensive metabolic panel.      Relevant Orders   Lipid panel   Dyspnea on exertion    Probably multifactorial with his weight gain, emphysema and deconditioning and diastolic dysfunction. Also exacerbated by low testosterone levels. It does not look like A. fib is playing a role. He has relatively preserved EF on several evaluations. He seems euvolemic, and is borderline hypotensive therefore we would not really don't treat diastolic dysfunction      Relevant Orders   Comprehensive metabolic panel   Essential hypertension - Primary (Chronic)    Actually borderline hypotensive. Unable to titrate medications further.      Relevant Orders   EKG 12-Lead (Completed)   PAD (peripheral artery disease) (HCC) (Chronic)    He had been followed by Dr. Hart Rochester for years and has had abnormal ABIs in the past. He has not followed up with anyone since and has not had any recent evaluation. Continues to be on Plavix and statin. He is walking, but is now noticing that his legs ache when he walks. Certainly could be related to low testosterone, but he needs to be evaluated. Plan: We will check abdominal aortic duplex as well as lower arterial duplex is with ABIs. I recall there being a potential diagnosis of Leriche syndrome which would mean a totally occluded aorta.      Relevant Orders   Lipid panel   VAS [place] ABI WITH/WO TBI   VAS [place] LOWER EXTREMITY ARTERIAL DUPLEX   AAA Duplex   PAF (paroxysmal atrial fibrillation) (HCC) (Chronic)    Real-time  was noted was when he had multivessel disease pre-CABG and some postop CABG none since then. Nothing on 30 event monitor. No symptoms to suggest recurrence. At this point I think continue low-dose beta blocker for rate control is warranted and I would not put on for the coagulation besides Plavix.      Relevant Orders   EKG 12-Lead (Completed)   Comprehensive metabolic panel      Current medicines are reviewed at length with the patient today. (+/- concerns) Okay to use testosterone The following changes have been made:   Patient Instructions  SCHEDULE [place] OFFICE Your physician has requested that you have an ankle brachial index (ABI). During this test an ultrasound and blood pressure cuff are used to evaluate the arteries that supply the arms and legs with blood. Allow thirty minutes for this exam. There are no restrictions or special instructions.  Your physician has requested that you have a lower extremity arterial duplex. This test is an ultrasound of the arteries in the legs. It looks at arterial blood flow in the legs. Allow one hour for Lower and Upper Arterial scans. There are no restrictions or special instructions  Your physician has requested that you have an abdominal aorta duplex. During this test, an ultrasound is used to evaluate the aorta. Allow 30 minutes for this exam. Do not eat after midnight the day before and avoid carbonated beverages   PLEASE TAKE LABSLIP TO PRIMARY TO HAVE DONE WITH THEIRS   Your physician wants you  to follow-up in 6 MONTHS WITH DR HARDING.You will receive a reminder letter in the mail two months in advance. If you don't receive a letter, please call our office to schedule the follow-up appointment.  If you need a refill on your cardiac medications before your next appointment, please call your pharmacy.     Studies Ordered:   Orders Placed This Encounter  Procedures  . Lipid panel  . Comprehensive metabolic panel  . EKG 12-Lead       Bryan Lemma, M.D., M.S. Interventional Cardiologist   Pager # [phone] Phone # [phone] [address] [place], [place] [address]

## 2016-10-23 NOTE — Telephone Encounter (Signed)
Pt calling stating pt called his pharmacy this morning [facility] [address] ([address] [facility] [employer]) to see if his albuterol was called in And it was not  Please call patient back once we have done that

## 2016-10-24 DIAGNOSIS — R350 Frequency of micturition: Secondary | ICD-10-CM | POA: Diagnosis not present

## 2016-10-24 DIAGNOSIS — E291 Testicular hypofunction: Secondary | ICD-10-CM | POA: Diagnosis not present

## 2016-10-24 DIAGNOSIS — N401 Enlarged prostate with lower urinary tract symptoms: Secondary | ICD-10-CM | POA: Diagnosis not present

## 2016-10-24 DIAGNOSIS — R351 Nocturia: Secondary | ICD-10-CM | POA: Diagnosis not present

## 2016-10-25 ENCOUNTER — Encounter: Payer: Self-pay | Admitting: Cardiology

## 2016-10-25 NOTE — Assessment & Plan Note (Signed)
Multivessel CAD status post CABG. Only able tolerate low-dose beta blocker along with statin and Plavix.

## 2016-10-25 NOTE — Assessment & Plan Note (Signed)
Quite likely related to low testosterone level. At this point it is probably the best option to simply treated so we can get back to being active.

## 2016-10-25 NOTE — Assessment & Plan Note (Signed)
Probably multifactorial with his weight gain, emphysema and deconditioning and diastolic dysfunction. Also exacerbated by low testosterone levels. It does not look like A. fib is playing a role. He has relatively preserved EF on several evaluations. He seems euvolemic, and is borderline hypotensive therefore we would not really don't treat diastolic dysfunction

## 2016-10-25 NOTE — Assessment & Plan Note (Signed)
Real-time was noted was when he had multivessel disease pre-CABG and some postop CABG none since then. Nothing on 30 event monitor. No symptoms to suggest recurrence. At this point I think continue low-dose beta blocker for rate control is warranted and I would not put on for the coagulation besides Plavix.

## 2016-10-25 NOTE — Assessment & Plan Note (Signed)
He had been followed by Dr. Hart Rochester for years and has had abnormal ABIs in the past. He has not followed up with anyone since and has not had any recent evaluation. Continues to be on Plavix and statin. He is walking, but is now noticing that his legs ache when he walks. Certainly could be related to low testosterone, but he needs to be evaluated. Plan: We will check abdominal aortic duplex as well as lower arterial duplex is with ABIs. I recall there being a potential diagnosis of Leriche syndrome which would mean a totally occluded aorta.

## 2016-10-25 NOTE — Assessment & Plan Note (Signed)
No active anginal symptoms. He does have some exertional dyspnea, especially related to his weight gain and low testosterone related fatigue.  He is only able to tolerate very low-dose beta blocker, and with his fatigue I'm reluctant to push anymore. He also has pretty low blood pressures. He is stable on Plavix and statin. Plavix as much for his CAD as it is for his reported PAD.  For now we'll plan to bend follow-up Myoview in 2020, unless his symptoms of dyspnea worsened.

## 2016-10-25 NOTE — Assessment & Plan Note (Addendum)
By the last check ICD, he appeared to be a goal LDL on Lipitor but his HDL remains low which is the main risk factor for him. I have not seen labs since December 2016 however.  we will go ahead and recheck lipid panel and comprehensive metabolic panel.

## 2016-10-25 NOTE — Assessment & Plan Note (Signed)
Actually borderline hypotensive. Unable to titrate medications further.

## 2016-10-26 ENCOUNTER — Telehealth: Payer: Self-pay | Admitting: Cardiology

## 2016-10-26 NOTE — Telephone Encounter (Signed)
New Message     Request for surgical clearance:  1. What type of surgery is being performed? Urolift for prostate   2. When is this surgery scheduled? Not yet   3. Are there any medications that need to be held prior to surgery and how long? Plavix , how long should they hold it   4. Name of physician performing surgery? Dr Patsi Sears  5. What is your office phone and fax number?  Fax [phone]  Office [phone] x [phone]

## 2016-10-29 ENCOUNTER — Encounter: Payer: Self-pay | Admitting: Family Medicine

## 2016-10-29 ENCOUNTER — Ambulatory Visit (INDEPENDENT_AMBULATORY_CARE_PROVIDER_SITE_OTHER): Payer: Medicare Other | Admitting: Family Medicine

## 2016-10-29 VITALS — BP 116/70 | HR 79 | Temp 97.8°F | Wt 214.2 lb

## 2016-10-29 DIAGNOSIS — N138 Other obstructive and reflux uropathy: Secondary | ICD-10-CM | POA: Diagnosis not present

## 2016-10-29 DIAGNOSIS — I70211 Atherosclerosis of native arteries of extremities with intermittent claudication, right leg: Secondary | ICD-10-CM

## 2016-10-29 DIAGNOSIS — I10 Essential (primary) hypertension: Secondary | ICD-10-CM

## 2016-10-29 DIAGNOSIS — N401 Enlarged prostate with lower urinary tract symptoms: Secondary | ICD-10-CM | POA: Diagnosis not present

## 2016-10-29 DIAGNOSIS — E042 Nontoxic multinodular goiter: Secondary | ICD-10-CM | POA: Diagnosis not present

## 2016-10-29 LAB — TSH: TSH: 1.93 u[IU]/mL (ref 0.35–4.50)

## 2016-10-29 NOTE — Progress Notes (Signed)
Pre visit review using our clinic review tool, if applicable. No additional management support is needed unless otherwise documented below in the visit note. 

## 2016-10-29 NOTE — Progress Notes (Signed)
  [name], MD Phone: [phone]  [name] is a 71 y.o. male who presents today for f/u.  HYPERTENSION  Disease Monitoring  Home BP Monitoring not checking consistently. Does note sometimes it is low. Chest pain- no    Dyspnea- mild with walking up hills. Was evaluated last week by cardiology for this. Notes this is stable. Medications  Compliance-  taking metoprolol. Lightheadedness-  does note some lightheadedness at times when he goes to stand up too quickly. Has not passed out or had any falls.  Edema- minimal  Multinodular goiter: Does note some dry skin. Does note he is always cold. No weight changes. No heat intolerance. Prior TSH normal.  BPH: He is set to have a procedure done to help open up his prostate to help alleviate some of his symptoms. He at times feels as though he does not fully empty. Notes nocturia 2-3 times. Currently on Flomax. Does note some frequency during the day though other days he is fine. He's also been placed on testosterone supplementation. He has not started this yet.   PMH: Former smoker   ROS see history of present illness  Objective  Physical Exam Vitals:   10/29/16 1333  BP: 116/70  Pulse: 79  Temp: 97.8 F (36.6 C)   Laying blood pressure 122/68 pulse 78 Sitting blood pressure 110/70 pulse 84 Standing blood pressure 102/68 pulse 87  BP Readings from Last 3 Encounters:  10/29/16 116/70  10/23/16 (!) 102/54  10/22/16 124/78   Wt Readings from Last 3 Encounters:  10/29/16 214 lb 3.2 oz (97.2 kg)  10/23/16 218 lb 6.4 oz (99.1 kg)  10/22/16 217 lb (98.4 kg)    Physical Exam  Constitutional: No distress.  Cardiovascular: Normal rate, regular rhythm and normal heart sounds.   Pulmonary/Chest: Effort normal and breath sounds normal.  Musculoskeletal: He exhibits no edema.  Neurological: He is alert. Gait normal.  Skin: Skin is warm and dry. He is not diaphoretic.     Assessment/Plan: Please see individual problem  list.  Essential hypertension Borderline hypotensive today. Also borderline orthostatic. We'll send a message to his cardiologist to see if we can decrease the dose of his metoprolol or if we should just stand pat and monitor. Did discuss his Flomax with the patient. This could be contributing. He will discuss with his urologist. Also advised on staying well-hydrated. Advised to rise slowly from seated positions. He'll continue to monitor.  Multinodular goiter Check TSH today.  BPH with obstruction/lower urinary tract symptoms Does have some symptoms. Somewhat improved with Flomax. He is following with urology for this and plans to have a procedure to help with this.   Orders Placed This Encounter  Procedures  . TSH     [name], MD [facility]

## 2016-10-29 NOTE — Patient Instructions (Addendum)
Nice to see you. We will obtain lab work today. I will send a message to your cardiologist regarding her lightheadedness. I would advise staying hydrated. If you develop worsening lightheadedness please let us know.

## 2016-10-30 LAB — COMPREHENSIVE METABOLIC PANEL
ALT: 17 U/L (ref 9–46)
AST: 15 U/L (ref 10–35)
Albumin: 3.8 g/dL (ref 3.6–5.1)
Alkaline Phosphatase: 64 U/L (ref 40–115)
BUN: 26 mg/dL — ABNORMAL HIGH (ref 7–25)
CO2: 23 mmol/L (ref 20–31)
Calcium: 9.1 mg/dL (ref 8.6–10.3)
Chloride: 105 mmol/L (ref 98–110)
Creat: 1.21 mg/dL — ABNORMAL HIGH (ref 0.70–1.18)
Glucose, Bld: 152 mg/dL — ABNORMAL HIGH (ref 65–99)
Potassium: 4.4 mmol/L (ref 3.5–5.3)
Sodium: 139 mmol/L (ref 135–146)
Total Bilirubin: 0.5 mg/dL (ref 0.2–1.2)
Total Protein: 6.4 g/dL (ref 6.1–8.1)

## 2016-10-30 LAB — LIPID PANEL
Cholesterol: 155 mg/dL (ref <200)
HDL: 36 mg/dL — ABNORMAL LOW (ref >40)
LDL Cholesterol: 63 mg/dL (ref <100)
Total CHOL/HDL Ratio: 4.3 Ratio (ref <5.0)
Triglycerides: 282 mg/dL — ABNORMAL HIGH (ref <150)
VLDL: 56 mg/dL — ABNORMAL HIGH (ref <30)

## 2016-10-30 NOTE — Telephone Encounter (Signed)
Low risk patient for low risk surgery. OK to hold plavix 5-7 d pre-op & restart 1-2 days post-op (day 1 restarting take 300 mg).  Bryan Lemma, MD

## 2016-10-30 NOTE — Telephone Encounter (Signed)
Patient aware. States will see urologist tomorrow

## 2016-10-30 NOTE — Assessment & Plan Note (Signed)
Does have some symptoms. Somewhat improved with Flomax. He is following with urology for this and plans to have a procedure to help with this.

## 2016-10-30 NOTE — Assessment & Plan Note (Addendum)
Borderline hypotensive today. Also borderline orthostatic. We'll send a message to his cardiologist to see if we can decrease the dose of his metoprolol or if we should just stand pat and monitor. Did discuss his Flomax with the patient. This could be contributing. He will discuss with his urologist. Also advised on staying well-hydrated. Advised to rise slowly from seated positions. He'll continue to monitor.

## 2016-10-30 NOTE — Telephone Encounter (Signed)
Called [name] at [facility] to inform her of the surgical clearance instructions per Dr. [name]:   Low risk patient for low risk surgery. OK to hold plavix 5-7 d pre-op & restart 1-2 days post-op (day 1 restarting take 300 mg).  Bryan Lemma, MD  She verbalized her understanding. The instructions have been faxed as well.

## 2016-10-30 NOTE — Assessment & Plan Note (Signed)
Check TSH today

## 2016-10-31 ENCOUNTER — Other Ambulatory Visit: Payer: Self-pay | Admitting: Urology

## 2016-10-31 DIAGNOSIS — E291 Testicular hypofunction: Secondary | ICD-10-CM | POA: Diagnosis not present

## 2016-11-02 ENCOUNTER — Other Ambulatory Visit: Payer: Self-pay | Admitting: Family Medicine

## 2016-11-02 ENCOUNTER — Encounter: Payer: Self-pay | Admitting: Cardiology

## 2016-11-02 DIAGNOSIS — R7309 Other abnormal glucose: Secondary | ICD-10-CM

## 2016-11-05 DIAGNOSIS — E291 Testicular hypofunction: Secondary | ICD-10-CM | POA: Diagnosis not present

## 2016-11-06 NOTE — Patient Instructions (Signed)
[name]  11/06/2016   Your procedure is scheduled on: 11-12-16  Report to [facility] Main  Entrance Take [facility] Elevators to 3rd floor to Short Stay Center at 0600 AM.   Call this number if you have problems the morning of surgery [phone]    Remember: ONLY 1 PERSON MAY GO WITH YOU TO SHORT STAY TO GET  READY MORNING OF YOUR SURGERY.  Do not eat food or drink liquids :After Midnight.    Take these medicines the morning of surgery with A SIP OF WATER: Metoprolol (Lopressor), Gabapentin (Neurontin), Tamulosin (Flomax), Finasteride  (Proscar)                               You may not have any metal on your body including hair pins and              piercings  Do not wear jewelry, make-up, lotions, powders or perfumes, deodorant             Men may shave face and neck.   Do not bring valuables to the hospital. [place] IS NOT             RESPONSIBLE   FOR VALUABLES.  Contacts, dentures or bridgework may not be worn into surgery.       Patients discharged the day of surgery will not be allowed to drive home.  Name and phone number of your driver:  Special Instructions: N/A              Please read over the following fact sheets you were given: _____________________________________________________________________   [facility] - Preparing for Surgery Before surgery, you can play an important role.  Because skin is not sterile, your skin needs to be as free of germs as possible.  You can reduce the number of germs on your skin by washing with CHG (chlorahexidine gluconate) soap before surgery.  CHG is an antiseptic cleaner which kills germs and bonds with the skin to continue killing germs even after washing. Please DO NOT use if you have an allergy to CHG or antibacterial soaps.  If your skin becomes reddened/irritated stop using the CHG and inform your nurse when you arrive at Short Stay. Do not shave (including legs and underarms) for at least 48 hours  prior to the first CHG shower.  You may shave your face/neck. Please follow these instructions carefully:  1.  Shower with CHG Soap the night before surgery and the  morning of Surgery.  2.  If you choose to wash your hair, wash your hair first as usual with your  normal  shampoo.  3.  After you shampoo, rinse your hair and body thoroughly to remove the  shampoo.                           4.  Use CHG as you would any other liquid soap.  You can apply chg directly  to the skin and wash                       Gently with a scrungie or clean washcloth.  5.  Apply the CHG Soap to your body ONLY FROM THE NECK DOWN.   Do not use on face/ open  Wound or open sores. Avoid contact with eyes, ears mouth and genitals (private parts).                       Wash face,  Genitals (private parts) with your normal soap.             6.  Wash thoroughly, paying special attention to the area where your surgery  will be performed.  7.  Thoroughly rinse your body with warm water from the neck down.  8.  DO NOT shower/wash with your normal soap after using and rinsing off  the CHG Soap.                9.  Pat yourself dry with a clean towel.            10.  Wear clean pajamas.            11.  Place clean sheets on your bed the night of your first shower and do not  sleep with pets. Day of Surgery : Do not apply any lotions/deodorants the morning of surgery.  Please wear clean clothes to the hospital/surgery center.  FAILURE TO FOLLOW THESE INSTRUCTIONS MAY RESULT IN THE CANCELLATION OF YOUR SURGERY PATIENT SIGNATURE_________________________________  NURSE SIGNATURE__________________________________  ________________________________________________________________________

## 2016-11-06 NOTE — Progress Notes (Signed)
10-24-16 (EPIC) EKG 08-23-16 (EPIC) CXR 04-04-15  (EPIC) ECHO

## 2016-11-07 ENCOUNTER — Encounter (HOSPITAL_COMMUNITY)
Admission: RE | Admit: 2016-11-07 | Discharge: 2016-11-07 | Disposition: A | Discharge status: Home / Self Care | Payer: Medicare Other | Source: Ambulatory Visit | Attending: Urology | Admitting: Urology

## 2016-11-07 ENCOUNTER — Encounter (HOSPITAL_COMMUNITY): Payer: Self-pay

## 2016-11-07 DIAGNOSIS — I10 Essential (primary) hypertension: Secondary | ICD-10-CM | POA: Diagnosis not present

## 2016-11-07 DIAGNOSIS — E291 Testicular hypofunction: Secondary | ICD-10-CM | POA: Diagnosis not present

## 2016-11-07 DIAGNOSIS — Z01812 Encounter for preprocedural laboratory examination: Secondary | ICD-10-CM | POA: Diagnosis not present

## 2016-11-07 DIAGNOSIS — I480 Paroxysmal atrial fibrillation: Secondary | ICD-10-CM | POA: Diagnosis not present

## 2016-11-07 DIAGNOSIS — Z951 Presence of aortocoronary bypass graft: Secondary | ICD-10-CM | POA: Insufficient documentation

## 2016-11-07 DIAGNOSIS — N400 Enlarged prostate without lower urinary tract symptoms: Secondary | ICD-10-CM | POA: Diagnosis not present

## 2016-11-07 DIAGNOSIS — E785 Hyperlipidemia, unspecified: Secondary | ICD-10-CM | POA: Insufficient documentation

## 2016-11-07 DIAGNOSIS — I25119 Atherosclerotic heart disease of native coronary artery with unspecified angina pectoris: Secondary | ICD-10-CM | POA: Insufficient documentation

## 2016-11-07 LAB — CBC
HCT: 39.3 % (ref 39.0–52.0)
Hemoglobin: 13.4 g/dL (ref 13.0–17.0)
MCH: 32.8 pg (ref 26.0–34.0)
MCHC: 34.1 g/dL (ref 30.0–36.0)
MCV: 96.3 fL (ref 78.0–100.0)
Platelets: 195 10*3/uL (ref 150–400)
RBC: 4.08 MIL/uL — ABNORMAL LOW (ref 4.22–5.81)
RDW: 12.9 % (ref 11.5–15.5)
WBC: 9.4 10*3/uL (ref 4.0–10.5)

## 2016-11-07 NOTE — Progress Notes (Signed)
Pt indicated that he discontinued his Plavix as instructed on 4-22 or 11-05-16. He couldn't recall the exact date.

## 2016-11-11 NOTE — H&P (Signed)
Office Visit Report     10/24/2016   --------------------------------------------------------------------------------   [name]  MRN: [id number]  PRIMARY CARE:  [name] All, MD  DOB: Sep 02, 1945, 71 year old Male  REFERRING:  [name] I. Marc Sears, MD   PROVIDER:  Jethro Bolus, M.D.    LOCATION:  [facility] [phone]   --------------------------------------------------------------------------------   CC: I have a decreased testosterone level.  HPI: [name] is a 71 year-old male established patient who is here for a decreased testosterone level.  He has had the symptoms for 3 years. His symptoms have gotten worse over the last year. He does become fatigued easily. He has gained weight. He is having trouble losing weight.   His sex drive has not decreased. He does have problems with erections. He does have trouble reaching climax. He does not have normal ejaculate volume.   He has not had injuries to the testicles or scrotum. He has not had scrotal surgery.   He was treated for hypogonadism while living in [facility] in 2015 (unknown T levels). Stopped b/c he was concerned with possible cardiac side effects, but now c/o severe fatigue, and would like to re-start Rx.   F/u to review labs.     CC: I have an enlarged prostate (follow-up).  HPI: He is currently on flomax and proscar for the symptoms due to the enlarged prostate gland. He is not on new medications for symptoms of prostate enlargement.   He does have an abnormal sensation when needing to urinate. He is not having problems getting his urine stream started. He does not have a good size and strength to his urinary stream. He is having problems with emptying his bladder well. He does dribble at the end of urination.   Failing medication. He is a good candidate for Urolift or Thulium laser prostatectomy. He had a flow rate that showed peak of 7 cc/s, with residual of 24 cc. voided  302cc.     AUA Symptom Score: 50% of the time he has the sensation of not emptying his bladder completely when finished urinating. Almost always he has to urinate again fewer than two hours after he has finished urinating. 50% of the time he has to start and stop again several times when he urinates. 50% of the time he finds it difficult to postpone urination. 50% of the time he has a weak urinary stream. Less than 20% of the time he has to push or strain to begin urination. He has to get up to urinate 3 times from the time he goes to bed until the time he gets up in the morning.   Calculated AUA Symptom Score: 21    IIEF-5 Score: The patient's confidence that he can get an erection is low. The patient's erections were hard enough for penetration a few times. The patient was able to maintain his erection after he had penetrated his partner a few times. The patient found sexual intercourse satisfactory sometimes.     QOL Score: He would feel mixed if he had to live with his urinary condition the way it is now for the rest of his life.   Calculated QOL Symptom Score: 3    ALLERGIES: None   MEDICATIONS: Finasteride 5 mg tablet  Metoprolol Succinate 25 mg tablet, extended release 24 hr  Tamsulosin Hcl 0.4 mg capsule, ext release 24 hr 1 capsule PO Daily  Tamsulosin Hcl 0.4 mg capsule, ext release 24 hr 1 capsule PO Daily  Acetaminophen Er 650 mg tablet, extended release  Atorvastatin Calcium 40 mg tablet  Clopidogrel 75 mg tablet  Gabapentin 400 mg capsule  Nitroglycerin 0.4 mg tablet, sublingual     GU PSH: Complex Uroflow - 10/16/2016 Cysto Remove Stent FB Sim - 07/04/2016 Cystoscopy - 10/16/2016 ESWL - 03/26/2016 Locm 300-399Mg /Ml Iodine,1Ml - 06/19/2016 Ureteroscopic laser litho, Right - 06/26/2016    NON-GU PSH: Coronary Artery Bypass Grafting, quadruple    GU PMH: BPH w/LUTS - 10/16/2016, - 08/22/2016, - 02/14/2016 Kidney Stone - 10/16/2016, - 08/22/2016, - 02/14/2016 Testicular  hypofunction - 10/16/2016, - 10/16/2016 Nocturia - 08/22/2016 Urinary Frequency - 08/22/2016 Calculus Ureter - 06/12/2016, - 05/02/2016, - 03/07/2016, - 02/14/2016    NON-GU PMH: Anxiety Arthritis Asthma Heart disease, unspecified Hypercholesterolemia Unspecified atrial fibrillation    FAMILY HISTORY: pneumonia - Father Strokes - Mother   SOCIAL HISTORY: Marital Status: Married Current Smoking Status: Patient does not smoke anymore. Has not smoked since 02/13/1993. Smoked 2 packs per day.  Light Drinker.  Does not drink caffeine.    REVIEW OF SYSTEMS:    GU Review Male:   Patient reports frequent urination, hard to postpone urination, get up at night to urinate, and erection problems. Patient denies burning/ pain with urination, leakage of urine, stream starts and stops, trouble starting your stream, have to strain to urinate , and penile pain.  Gastrointestinal (Upper):   Patient denies nausea, vomiting, and indigestion/ heartburn.  Gastrointestinal (Lower):   Patient denies diarrhea and constipation.  Constitutional:   Patient denies fever, night sweats, weight loss, and fatigue.  Skin:   Patient denies skin rash/ lesion and itching.  Eyes:   Patient reports blurred vision. Patient denies double vision.  Ears/ Nose/ Throat:   Patient denies sore throat and sinus problems.  Hematologic/Lymphatic:   Patient denies swollen glands and easy bruising.  Cardiovascular:   Patient reports leg swelling. Patient denies chest pains.  Respiratory:   Patient reports shortness of breath. Patient denies cough.  Endocrine:   Patient denies excessive thirst.  Musculoskeletal:   Patient denies back pain and joint pain.  Neurological:   Lightheaded. Patient denies headaches and dizziness.  Psychologic:   Patient denies depression and anxiety.   VITAL SIGNS:      10/24/2016 08:38 AM  BP 77/50 mmHg  Pulse 80 /min  Temperature 97.9 F / 37 C   GU PHYSICAL EXAMINATION:    Anus and Perineum: No hemorrhoids.  No anal stenosis. No rectal fissure, no anal fissure. No edema, no dimple, no perineal tenderness, no anal tenderness.  Scrotum: No lesions. No edema. No cysts. No warts.  Epididymides: Right: no spermatocele, no masses, no cysts, no tenderness, no induration, no enlargement. Left: no spermatocele, no masses, no cysts, no tenderness, no induration, no enlargement.  Testes: No tenderness, no swelling, no enlargement left testes. No tenderness, no swelling, no enlargement right testes. Normal location left testes. Normal location right testes. No mass, no cyst, no varicocele, no hydrocele left testes. No mass, no cyst, no varicocele, no hydrocele right testes.  Urethral Meatus: Normal size. No lesion, no wart, no discharge, no polyp. Normal location.  Penis: Circumcised, no warts, no cracks. No dorsal Peyronie's plaques, no left corporal Peyronie's plaques, no right corporal Peyronie's plaques, no scarring, no warts. No balanitis, no meatal stenosis.  Prostate: Prostate 4 + size. Left lobe normal consistency, right lobe normal consistency. Symmetrical lobes. No prostate nodule. Left lobe no tenderness, right lobe no tenderness.   Seminal Vesicles: Nonpalpable.  Sphincter Tone: Normal sphincter. No rectal tenderness. No rectal mass.    MULTI-SYSTEM PHYSICAL EXAMINATION:    Constitutional: Well-nourished. No physical deformities. Normally developed. Good grooming.  Neck: Neck symmetrical, not swollen. Normal tracheal position.  Respiratory: No labored breathing, no use of accessory muscles.   Cardiovascular: Normal temperature, normal extremity pulses, no swelling, no varicosities.  Lymphatic: No enlargement of neck, axillae, groin.  Skin: No paleness, no jaundice, no cyanosis. No lesion, no ulcer, no rash.  Neurologic / Psychiatric: Oriented to time, oriented to place, oriented to person. No depression, no anxiety, no agitation.  Gastrointestinal: No mass, no tenderness, no rigidity, non obese  abdomen.  Eyes: Normal conjunctivae. Normal eyelids.  Musculoskeletal: Spine, ribs, pelvis no bilateral tenderness. Normal gait and station of head and neck. Left posterior back bruising.      PAST DATA REVIEWED:  Source Of History:  Patient  Lab Test Review:   PSA, Total Testosterone, Estradiol  Records Review:   Previous Patient Records   10/16/16  PSA  Total PSA 0.24 ng/dl    16/10/96  Hormones  Testosterone, Total 175.5 pg/dL    PROCEDURES:          Urinalysis Dipstick Dipstick Cont'd  Color: Yellow Bilirubin: Neg  Appearance: Clear Ketones: Neg  Specific Gravity: 1.025 Blood: Neg  pH: <=5.0 Protein: Trace  Glucose: Neg Urobilinogen: 0.2    Nitrites: Neg    Leukocyte Esterase: Neg    ASSESSMENT:      ICD-10 Details  1 GU:   Testicular hypofunction - E29.1   2   BPH w/LUTS - N40.1   3   Nocturia - R35.1   4   Urinary Frequency - R35.0   5 NON-GU:   Unspecified atrial fibrillation - I48.91           Notes:   71 year old male, failing medical therapy for BPH. He is currently on maximal medical therapy, with finasteride and tamsulosin. He has a prostate symptom score of 21, with symptoms of straining to urinate, nocturia, urgency frequency. Cystoscopy shows no median lobe, and bilobar BPH. He has trabeculation, but no cellules. There is no bladder stone or bladder tumor. His peak flow rate is 7 cc/s.   He is a reasonable candidate for a Urolift or a thulium laser procedure. We have discussed both procedures today, and he has selected Urolift because it is the least invasive, with the least chance of bleeding, and can have other prostate surgery done in the future if necessary. He is taking Plavix, and should have medical clearance prior to surgery ( ? can stop Plavix prior to surgery- Dr. [name])   In addition, the patient notes that he was treated with testosterone while living in [place] in 2015. He stopped the medication because of concerns for cardiac disease, but  now is not concerned with that. He complains of severe fatigue, lack of exercise ability, decreased libido. We will check testosterone again today, and begin T therapy with peak and trough levels.     PLAN:            Medications New Meds: Testosterone Cypionate 200 mg/ml vial 1 ml IM Q2WK   #10  1 Refill(s)            Orders Labs Total Testosterone  Lab Notes: Peak (4-7 days after injection), Trough (10-14 days after injection)          Schedule Labs: 2 Weeks - Total Testosterone    3 Weeks - Total  Testosterone  Return Visit/Planned Activity: 1 Week - Nurse Visit             Note: Testosterone injection  Return Visit/Planned Activity: Next Available Appointment - Schedule Surgery             Note: plan for Urolift          Document Letter(s):  Created for Patient: Clinical Summary         Notes:   1. Dr. [name], Cardiology: can anticoagulant be stopped for surgery. If not , still ok for Urolift  2. 2nd T level, and begin T injection therapy with peak and trough levels/ Hgb/ Hct.     Signed by Jethro Bolus, M.D. on 10/24/16 at 9:41 AM (EDT)     The information contained in this medical record document is considered private and confidential patient information. This information can only be used for the medical diagnosis and/or medical services that are being provided by the patient's selected caregivers. This information can only be distributed outside of the patient's care if the patient agrees and signs waivers of authorization for this information to be sent to an outside source or route.

## 2016-11-12 ENCOUNTER — Encounter (HOSPITAL_COMMUNITY): Payer: Self-pay | Admitting: *Deleted

## 2016-11-12 ENCOUNTER — Ambulatory Visit (HOSPITAL_COMMUNITY): Payer: Medicare Other | Admitting: Anesthesiology

## 2016-11-12 ENCOUNTER — Encounter (HOSPITAL_COMMUNITY)
Admission: RE | Disposition: A | Discharge status: Home / Self Care | Payer: Self-pay | Source: Ambulatory Visit | Attending: Urology

## 2016-11-12 ENCOUNTER — Ambulatory Visit (HOSPITAL_COMMUNITY)
Admission: RE | Admit: 2016-11-12 | Discharge: 2016-11-12 | Disposition: A | Discharge status: Home / Self Care | Payer: Medicare Other | Source: Ambulatory Visit | Attending: Urology | Admitting: Urology

## 2016-11-12 DIAGNOSIS — I739 Peripheral vascular disease, unspecified: Secondary | ICD-10-CM | POA: Insufficient documentation

## 2016-11-12 DIAGNOSIS — I519 Heart disease, unspecified: Secondary | ICD-10-CM | POA: Insufficient documentation

## 2016-11-12 DIAGNOSIS — R351 Nocturia: Secondary | ICD-10-CM | POA: Diagnosis not present

## 2016-11-12 DIAGNOSIS — R350 Frequency of micturition: Secondary | ICD-10-CM | POA: Diagnosis not present

## 2016-11-12 DIAGNOSIS — Z79899 Other long term (current) drug therapy: Secondary | ICD-10-CM | POA: Diagnosis not present

## 2016-11-12 DIAGNOSIS — Z6831 Body mass index (BMI) 31.0-31.9, adult: Secondary | ICD-10-CM | POA: Diagnosis not present

## 2016-11-12 DIAGNOSIS — D759 Disease of blood and blood-forming organs, unspecified: Secondary | ICD-10-CM | POA: Insufficient documentation

## 2016-11-12 DIAGNOSIS — F419 Anxiety disorder, unspecified: Secondary | ICD-10-CM | POA: Diagnosis not present

## 2016-11-12 DIAGNOSIS — I25810 Atherosclerosis of coronary artery bypass graft(s) without angina pectoris: Secondary | ICD-10-CM | POA: Diagnosis not present

## 2016-11-12 DIAGNOSIS — E291 Testicular hypofunction: Secondary | ICD-10-CM | POA: Insufficient documentation

## 2016-11-12 DIAGNOSIS — J45909 Unspecified asthma, uncomplicated: Secondary | ICD-10-CM | POA: Diagnosis not present

## 2016-11-12 DIAGNOSIS — N400 Enlarged prostate without lower urinary tract symptoms: Secondary | ICD-10-CM | POA: Diagnosis not present

## 2016-11-12 DIAGNOSIS — Z87442 Personal history of urinary calculi: Secondary | ICD-10-CM | POA: Diagnosis not present

## 2016-11-12 DIAGNOSIS — J449 Chronic obstructive pulmonary disease, unspecified: Secondary | ICD-10-CM | POA: Diagnosis not present

## 2016-11-12 DIAGNOSIS — M1990 Unspecified osteoarthritis, unspecified site: Secondary | ICD-10-CM | POA: Diagnosis not present

## 2016-11-12 DIAGNOSIS — N401 Enlarged prostate with lower urinary tract symptoms: Secondary | ICD-10-CM | POA: Insufficient documentation

## 2016-11-12 DIAGNOSIS — I480 Paroxysmal atrial fibrillation: Secondary | ICD-10-CM | POA: Diagnosis not present

## 2016-11-12 DIAGNOSIS — I2510 Atherosclerotic heart disease of native coronary artery without angina pectoris: Secondary | ICD-10-CM | POA: Diagnosis not present

## 2016-11-12 DIAGNOSIS — I4891 Unspecified atrial fibrillation: Secondary | ICD-10-CM | POA: Insufficient documentation

## 2016-11-12 DIAGNOSIS — Z951 Presence of aortocoronary bypass graft: Secondary | ICD-10-CM | POA: Insufficient documentation

## 2016-11-12 DIAGNOSIS — I10 Essential (primary) hypertension: Secondary | ICD-10-CM | POA: Diagnosis not present

## 2016-11-12 DIAGNOSIS — E7800 Pure hypercholesterolemia, unspecified: Secondary | ICD-10-CM | POA: Insufficient documentation

## 2016-11-12 HISTORY — PX: CYSTOSCOPY WITH INSERTION OF UROLIFT: SHX6678

## 2016-11-12 SURGERY — CYSTOSCOPY WITH INSERTION OF UROLIFT
Anesthesia: General

## 2016-11-12 MED ORDER — ONDANSETRON HCL 4 MG/2ML IJ SOLN
INTRAMUSCULAR | Status: DC | PRN
  Administered 2016-11-12: 4 mg via INTRAVENOUS

## 2016-11-12 MED ORDER — CEFAZOLIN SODIUM-DEXTROSE 2-4 GM/100ML-% IV SOLN
2.0000 g | INTRAVENOUS | Status: AC
  Administered 2016-11-12: 2 g via INTRAVENOUS
  Filled 2016-11-12: qty 100

## 2016-11-12 MED ORDER — TRAMADOL-ACETAMINOPHEN 37.5-325 MG PO TABS: 1.0000 | ORAL_TABLET | Freq: Four times a day (QID) | ORAL | 0 refills | Status: DC | PRN

## 2016-11-12 MED ORDER — PROMETHAZINE HCL 25 MG/ML IJ SOLN: 6.2500 mg | INTRAMUSCULAR | Status: DC | PRN

## 2016-11-12 MED ORDER — ONDANSETRON HCL 4 MG/2ML IJ SOLN
INTRAMUSCULAR | Status: AC
  Filled 2016-11-12: qty 2

## 2016-11-12 MED ORDER — FENTANYL CITRATE (PF) 100 MCG/2ML IJ SOLN: 25.0000 ug | INTRAMUSCULAR | Status: DC | PRN

## 2016-11-12 MED ORDER — FENTANYL CITRATE (PF) 100 MCG/2ML IJ SOLN
INTRAMUSCULAR | Status: AC
  Filled 2016-11-12: qty 2

## 2016-11-12 MED ORDER — FENTANYL CITRATE (PF) 100 MCG/2ML IJ SOLN
INTRAMUSCULAR | Status: DC | PRN
Start: 2016-11-12 — End: 2016-11-12
  Administered 2016-11-12: 25 ug via INTRAVENOUS
  Administered 2016-11-12: 50 ug via INTRAVENOUS
  Administered 2016-11-12: 25 ug via INTRAVENOUS

## 2016-11-12 MED ORDER — TRIMETHOPRIM 100 MG PO TABS: 100.0000 mg | ORAL_TABLET | ORAL | 0 refills | Status: DC

## 2016-11-12 MED ORDER — PHENAZOPYRIDINE HCL 200 MG PO TABS: 200.0000 mg | ORAL_TABLET | Freq: Three times a day (TID) | ORAL | 0 refills | Status: DC | PRN

## 2016-11-12 MED ORDER — MEPERIDINE HCL 50 MG/ML IJ SOLN: 6.2500 mg | INTRAMUSCULAR | Status: DC | PRN

## 2016-11-12 MED ORDER — STERILE WATER FOR IRRIGATION IR SOLN
Status: DC | PRN
  Administered 2016-11-12: 3000 mL

## 2016-11-12 MED ORDER — PROPOFOL 10 MG/ML IV BOLUS
INTRAVENOUS | Status: DC | PRN
  Administered 2016-11-12: 140 mg via INTRAVENOUS

## 2016-11-12 MED ORDER — LIDOCAINE 2% (20 MG/ML) 5 ML SYRINGE
INTRAMUSCULAR | Status: AC
  Filled 2016-11-12: qty 5

## 2016-11-12 MED ORDER — LIDOCAINE HCL (CARDIAC) 20 MG/ML IV SOLN
INTRAVENOUS | Status: DC | PRN
  Administered 2016-11-12: 50 mg via INTRAVENOUS

## 2016-11-12 MED ORDER — LACTATED RINGERS IV SOLN
INTRAVENOUS | Status: DC
  Administered 2016-11-12: 07:00:00 via INTRAVENOUS

## 2016-11-12 MED ORDER — MIDAZOLAM HCL 2 MG/2ML IJ SOLN: 0.5000 mg | Freq: Once | INTRAMUSCULAR | Status: DC | PRN

## 2016-11-12 MED ORDER — PROPOFOL 10 MG/ML IV BOLUS
INTRAVENOUS | Status: AC
  Filled 2016-11-12: qty 20

## 2016-11-12 SURGICAL SUPPLY — 13 items
BAG URINE DRAINAGE (UROLOGICAL SUPPLIES) ×2 IMPLANT
BAG URO CATCHER STRL LF (MISCELLANEOUS) ×2 IMPLANT
CATH FOLEY 2WAY SLVR  5CC 14FR (CATHETERS) ×1
CATH FOLEY 2WAY SLVR  5CC 16FR (CATHETERS)
CATH FOLEY 2WAY SLVR 5CC 14FR (CATHETERS) IMPLANT
CATH FOLEY 2WAY SLVR 5CC 16FR (CATHETERS) ×1 IMPLANT
COVER SURGICAL LIGHT HANDLE (MISCELLANEOUS) ×1 IMPLANT
GLOVE BIO SURGEON STRL SZ7.5 (GLOVE) ×2 IMPLANT
GOWN STRL REUS W/TWL XL LVL3 (GOWN DISPOSABLE) ×4 IMPLANT
MANIFOLD NEPTUNE II (INSTRUMENTS) ×2 IMPLANT
PACK CYSTO (CUSTOM PROCEDURE TRAY) ×2 IMPLANT
SYSTEM UROLIFT (Male Continence) ×8 IMPLANT
TUBING CONNECTING 10 (TUBING) ×2 IMPLANT

## 2016-11-12 NOTE — Anesthesia Procedure Notes (Signed)
Procedure Name: LMA Insertion Date/Time: 11/12/2016 8:08 AM Performed by: Thornell Mule Pre-anesthesia Checklist: Patient identified, Emergency Drugs available, Suction available and Patient being monitored Patient Re-evaluated:Patient Re-evaluated prior to inductionOxygen Delivery Method: Circle system utilized Preoxygenation: Pre-oxygenation with 100% oxygen Intubation Type: IV induction LMA: LMA inserted LMA Size: 4.0 Number of attempts: 2 Placement Confirmation: positive ETCO2 Tube secured with: Tape Dental Injury: Teeth and Oropharynx as per pre-operative assessment

## 2016-11-12 NOTE — Transfer of Care (Signed)
Immediate Anesthesia Transfer of Care Note  Patient: [name]  Procedure(s) Performed: Procedure(s): CYSTOSCOPY WITH INSERTION OF UROLIFT x4 (N/A)  Patient Location: PACU  Anesthesia Type:General  Level of Consciousness:  sedated, patient cooperative and responds to stimulation  Airway & Oxygen Therapy:Patient Spontanous Breathing and Patient connected to face mask oxgen  Post-op Assessment:  Report given to PACU RN and Post -op Vital signs reviewed and stable  Post vital signs:  Reviewed and stable  Last Vitals:  Vitals:   11/12/16 0616  BP: 137/73  Pulse: 77  Resp: 18  Temp: 36.6 C    Complications: No apparent anesthesia complications

## 2016-11-12 NOTE — Discharge Instructions (Addendum)
Benign Prostatic Hyperplasia °Benign prostatic hyperplasia is when the prostate gland is bigger than normal (enlarged). The prostate is a gland that produces the fluid that goes into semen. It is near the opening to the bladder and it surrounds the tube that drains urine out of the body (urethra). Benign prostatic hyperplasia is common among older men and it typically causes problems with urinating. °The prostate grows slowly as you age. As the prostate grows, it can pinch the urethra. This causes the bladder to work too hard to pass urine, which leads to a thickened bladder wall. The bladder may eventually become weak and unable to empty completely. °What are the causes? °The exact cause of this condition is not known. It may be related to changes in hormones as the body ages. °What increases the risk? °You are more likely to develop this condition if: °· You have a family history of the condition. °· You are age 40 or older. °· You have a history of erectile dysfunction. °· You do not exercise. °· You have certain medical conditions, including: °¨ Type 2 diabetes. °¨ Obesity. °¨ Heart and circulatory disease. °What are the signs or symptoms? °Symptoms of this condition include: °· Weak or interrupted urine stream. °· Dribbling or leaking urine. °· Feeling like the bladder has not emptied completely. °· Difficulty starting urination. °· Getting up frequently at night to urinate. °· Urinating more often (8 or more times a day). °· Accidental loss of urine (urinary incontinence). °· Pain during urination or ejaculation. °· Urine with an unusual smell or color. °The size of the prostate does not always determine the severity of the symptoms. For example, a man with a large prostate may experience minor symptoms, or a man with a smaller prostate may experience a severe blockage. °How is this diagnosed? °This condition may be diagnosed based on: °· Your medical history and symptoms. °· A physical exam. This usually  includes a digital rectal exam. During this exam, your health care provider places a gloved, lubricated finger into the rectum to feel the size of the prostate. °· A blood test. This test checks for high levels of a protein that is produced by the prostate (prostate specific antigen, PSA). °· Tests to examine how well the urethra and bladder are functioning (urodynamic tests). °· Cystoscopy. For this test, a small, tube-shaped instrument (cystoscope) is used to look inside the urethra and bladder. The cystoscope is placed into the urinary tract through the opening at the tip of the penis. °· Urine tests. °· Ultrasound. °How is this treated? °Treatment for this condition depends on how severe your symptoms are. Treatment may include: °· Active surveillance or "watchful waiting." If your symptoms are mild, your health care provider may delay treatment and ask you to keep track of your symptoms. You will have regular checkups to examine the size of your prostate, discuss symptoms, and determine whether treatment is needed. °· Medicines. These may be used to: °¨ Stop prostate growth. °¨ Shrink the prostate. °¨ Relieve symptoms. °· Lifestyle changes, including: °¨ Pelvic floor muscle exercises. The pelvic floor muscles are a group of muscles that relax when you urinate. °¨ Bladder training. This involves exercises that train the bladder to hold more urine for longer periods. °¨ Reducing the amount of liquid that you drink. This is especially important before sleeping and before long periods of time spent in public. °¨ Reducing the amount of caffeine and alcohol that you drink. °¨ Treating or preventing constipation. °·   Surgery to reduce the size of the prostate or widen the urethra. This is typically done if your symptoms are severe or there are serious complications from the enlarged prostate. Follow these instructions at home: Medicines   Take over-the-counter and prescription medicines as told by your health care  provider.  Avoid certain medicines, such as decongestants, antihistamines, and some prescription medicines as told by your health care provider. Ask your health care provider which medicines you should avoid. General instructions   Monitor your symptoms for any changes. Tell your health care provider about any changes.  Give yourself time when you urinate.  Avoid certain beverages that can irritate the bladder, such as:  Alcohol.  Caffeinated drinks like coffee, tea, and cola.  Avoid drinking large amounts of liquid before bed or before going out in public.  Do pelvic floor muscle or bladder training exercises as told by your health care provider.  Keep all follow-up visits as told by your health care provider. This is important. Contact a health care provider if:  Your develop new or worse symptoms.  You have trouble getting or maintaining an erection.  You have a fever.  You have pain or burning during urination.  You have blood in your urine. Get help right away if:  You have severe pain when urinating.  You cannot urinate.  You have severe pain in your abdomen.  You are dizzy.  You faint.  You have severe back pain.  Your urine is dark red and difficult to see through.  You have large blood clots in your urine.  You have severe pain after an erection.  You have chest pain, dizziness, or nausea during sexual activity. Summary  The prostate is a gland that produces the fluid that goes into semen. It is near the opening to the bladder and it surrounds the tube that drains urine out of the body (urethra).  Benign prostatic hyperplasia is common among older men and it typically causes problems with urinating.  If your symptoms are mild, your health care provider may delay treatment and ask you to keep track of your symptoms. You will have regular checkups to examine the size of your prostate, discuss symptoms, and determine whether treatment is needed.  If  directed, you may need to avoid certain medicines, such as decongestants, antihistamines, and some prescription medicines.  Contact your health care provider if you develop new or worse symptoms. This information is not intended to replace advice given to you by your health care provider. Make sure you discuss any questions you have with your health care provider. Document Released: 07/02/2005 Document Revised: 05/21/2016 Document Reviewed: 05/21/2016 Elsevier Interactive Patient Education  2017 [employer]    Cystoscopy, Care After Refer to this sheet in the next few weeks. These instructions provide you with information about caring for yourself after your procedure. Your health care provider may also give you more specific instructions. Your treatment has been planned according to current medical practices, but problems sometimes occur. Call your health care provider if you have any problems or questions after your procedure. What can I expect after the procedure? After the procedure, it is common to have:  Mild pain when you urinate. Pain should stop within a few minutes after you urinate. This may last for up to 1 week.  A small amount of blood in your urine for several days.  Feeling like you need to urinate but producing only a small amount of urine. Follow these instructions at  home:   Medicines   Take over-the-counter and prescription medicines only as told by your health care provider.  If you were prescribed an antibiotic medicine, take it as told by your health care provider. Do not stop taking the antibiotic even if you start to feel better. General instructions    Return to your normal activities as told by your health care provider. Ask your health care provider what activities are safe for you.  Do not drive for 24 hours if you received a sedative.  Watch for any blood in your urine. If the amount of blood in your urine increases, call your health care  provider.  Follow instructions from your health care provider about eating or drinking restrictions.  If a tissue sample was removed for testing (biopsy) during your procedure, it is your responsibility to get your test results. Ask your health care provider or the department performing the test when your results will be ready.  Drink enough fluid to keep your urine clear or pale yellow.  Keep all follow-up visits as told by your health care provider. This is important. Contact a health care provider if:  You have pain that gets worse or does not get better with medicine, especially pain when you urinate.  You have difficulty urinating. Get help right away if:  You have more blood in your urine.  You have blood clots in your urine.  You have abdominal pain.  You have a fever or chills.  You are unable to urinate. This information is not intended to replace advice given to you by your health care provider. Make sure you discuss any questions you have with your health care provider. Document Released: 01/19/2005 Document Revised: 12/08/2015 Document Reviewed: 05/19/2015 Elsevier Interactive Patient Education  2017 [employer]    Indwelling Urinary Catheter Care, Adult Take good care of your catheter to keep it working and to prevent problems. How to wear your catheter Attach your catheter to your leg with tape (adhesive tape) or a leg strap. Make sure it is not too tight. If you use tape, remove any bits of tape that are already on the catheter. How to wear a drainage bag You should have:  A large overnight bag.  A small leg bag. Overnight Bag  You may wear the overnight bag at any time. Always keep the bag below the level of your bladder but off the floor. When you sleep, put a clean plastic bag in a wastebasket. Then hang the bag inside the wastebasket. Leg Bag  Never wear the leg bag at night. Always wear the leg bag below your knee. Keep the leg bag secure with a leg  strap or tape. How to care for your skin  Clean the skin around the catheter at least once every day.  Shower every day. Do not take baths.  Put creams, lotions, or ointments on your genital area only as told by your doctor.  Do not use powders, sprays, or lotions on your genital area. How to clean your catheter and your skin 1. Wash your hands with soap and water. 2. Wet a washcloth in warm water and gentle (mild) soap. 3. Use the washcloth to clean the skin where the catheter enters your body. Clean downward and wipe away from the catheter in small circles. Do not wipe toward the catheter. 4. Pat the area dry with a clean towel. Make sure to clean off all soap. How to care for your drainage bags Empty your drainage  bag when it is ?- full or at least 2-3 times a day. Replace your drainage bag once a month or sooner if it starts to smell bad or look dirty. Do not clean your drainage bag unless told by your doctor. Emptying a drainage bag   Supplies Needed  Rubbing alcohol.  Gauze pad or cotton ball.  Tape or a leg strap. Steps 1. Wash your hands with soap and water. 2. Separate (detach) the bag from your leg. 3. Hold the bag over the toilet or a clean container. Keep the bag below your hips and bladder. This stops pee (urine) from going back into the tube. 4. Open the pour spout at the bottom of the bag. 5. Empty the pee into the toilet or container. Do not let the pour spout touch any surface. 6. Put rubbing alcohol on a gauze pad or cotton ball. 7. Use the gauze pad or cotton ball to clean the pour spout. 8. Close the pour spout. 9. Attach the bag to your leg with tape or a leg strap. 10. Wash your hands. Changing a drainage bag  Supplies Needed  Alcohol wipes.  A clean drainage bag.  Adhesive tape or a leg strap. Steps 1. Wash your hands with soap and water. 2. Separate the dirty bag from your leg. 3. Pinch the rubber catheter with your fingers so that pee does not  spill out. 4. Separate the catheter tube from the drainage tube where these tubes connect (at the connection valve). Do not let the tubes touch any surface. 5. Clean the end of the catheter tube with an alcohol wipe. Use a different alcohol wipe to clean the end of the drainage tube. 6. Connect the catheter tube to the drainage tube of the clean bag. 7. Attach the new bag to the leg with adhesive tape or a leg strap. 8. Wash your hands. How to prevent infection and other problems  Never pull on your catheter or try to remove it. Pulling can damage tissue in your body.  Always wash your hands before and after touching your catheter.  If a leg strap gets wet, replace it with a dry one.  Drink enough fluids to keep your pee clear or pale yellow, or as told by your doctor.  Do not let the drainage bag or tubing touch the floor.  Wear cotton underwear.  If you are male, wipe from front to back after you poop (have a bowel movement).  Check on the catheter often to make sure it works and the tubing is not twisted. Get help if:  Your pee is cloudy.  Your pee smells unusually bad.  Your pee is not draining into the bag.  Your tube gets clogged.  Your catheter starts to leak.  Your bladder feels full. Get help right away if:  You have redness, swelling, or pain where the catheter enters your body.  You have fluid, pus, or a bad smell coming from the area where the catheter enters your body.  The area where the catheter enters your body feels warm.  You have a fever.  You have pain in your:  Stomach (abdomen).  Legs.  Lower back.  Bladder.  You see blood fill the catheter.  Your pee is pink or red.  You feel sick to your stomach (nauseous).  You throw up (vomit).  You have chills.  Your catheter gets pulled out. This information is not intended to replace advice given to you by your health care  provider. Make sure you discuss any questions you have with your  health care provider. Document Released: 10/27/2012 Document Revised: 05/30/2016 Document Reviewed: 12/15/2013 Elsevier Interactive Patient Education  2017 [employer]   General Anesthesia, Adult, Care After These instructions provide you with information about caring for yourself after your procedure. Your health care provider may also give you more specific instructions. Your treatment has been planned according to current medical practices, but problems sometimes occur. Call your health care provider if you have any problems or questions after your procedure. What can I expect after the procedure? After the procedure, it is common to have:  Vomiting.  A sore throat.  Mental slowness. It is common to feel:  Nauseous.  Cold or shivery.  Sleepy.  Tired.  Sore or achy, even in parts of your body where you did not have surgery. Follow these instructions at home: For at least 24 hours after the procedure:   Do not:  Participate in activities where you could fall or become injured.  Drive.  Use heavy machinery.  Drink alcohol.  Take sleeping pills or medicines that cause drowsiness.  Make important decisions or sign legal documents.  Take care of children on your own.  Rest. Eating and drinking   If you vomit, drink water, juice, or soup when you can drink without vomiting.  Drink enough fluid to keep your urine clear or pale yellow.  Make sure you have little or no nausea before eating solid foods.  Follow the diet recommended by your health care provider. General instructions   Have a responsible adult stay with you until you are awake and alert.  Return to your normal activities as told by your health care provider. Ask your health care provider what activities are safe for you.  Take over-the-counter and prescription medicines only as told by your health care provider.  If you smoke, do not smoke without supervision.  Keep all follow-up visits as  told by your health care provider. This is important. Contact a health care provider if:  You continue to have nausea or vomiting at home, and medicines are not helpful.  You cannot drink fluids or start eating again.  You cannot urinate after 8-12 hours.  You develop a skin rash.  You have fever.  You have increasing redness at the site of your procedure. Get help right away if:  You have difficulty breathing.  You have chest pain.  You have unexpected bleeding.  You feel that you are having a life-threatening or urgent problem. This information is not intended to replace advice given to you by your health care provider. Make sure you discuss any questions you have with your health care provider. Document Released: 10/08/2000 Document Revised: 12/05/2015 Document Reviewed: 06/16/2015 Elsevier Interactive Patient Education  2017 [employer].

## 2016-11-12 NOTE — Anesthesia Preprocedure Evaluation (Addendum)
Anesthesia Evaluation  Patient identified by MRN, date of birth, ID band Patient awake    Reviewed: Allergy & Precautions, NPO status , Patient's Chart, lab work & pertinent test results, reviewed documented beta blocker date and time   History of Anesthesia Complications Negative for: history of anesthetic complications  Airway Mallampati: II  TM Distance: >3 FB Neck ROM: Limited    Dental  (+) Dental Advisory Given   Pulmonary COPD, former smoker,    breath sounds clear to auscultation       Cardiovascular hypertension, Pt. on medications and Pt. on home beta blockers (-) angina+ CAD, + CABG and + Peripheral Vascular Disease  Cardiac stents: 2016 CABG x5.  + dysrhythmias Atrial Fibrillation  Rhythm:Regular Rate:Normal  '16 ECHO: EF 55-60%, valves OK   Neuro/Psych Depression negative neurological ROS     GI/Hepatic negative GI ROS, Neg liver ROS,   Endo/Other  Morbid obesity  Renal/GU Renal InsufficiencyRenal disease (creat 1.21)     Musculoskeletal  (+) Arthritis , Osteoarthritis,    Abdominal (+) + obese,   Peds  Hematology  (+) Blood dyscrasia (plavix), ,   Anesthesia Other Findings   Reproductive/Obstetrics                            Anesthesia Physical Anesthesia Plan  ASA: III  Anesthesia Plan: General   Post-op Pain Management:    Induction: Intravenous  Airway Management Planned: LMA  Additional Equipment:   Intra-op Plan:   Post-operative Plan:   Informed Consent: I have reviewed the patients History and Physical, chart, labs and discussed the procedure including the risks, benefits and alternatives for the proposed anesthesia with the patient or authorized representative who has indicated his/her understanding and acceptance.   Dental advisory given  Plan Discussed with: CRNA and Surgeon  Anesthesia Plan Comments: (Plan routine monitors, GA- LMA OK)         Anesthesia Quick Evaluation

## 2016-11-12 NOTE — Op Note (Signed)
Pre-operative diagnosis :   BPH  Postoperative diagnosis:  Same  Operation:  Cystoscopy, insertion of UROLIFT implants x 4  Surgeon:  S. Patsi Sears, MD  First assistant:  none  Anesthesia: General LMA  Preparation:  After appropriate preanesthesia, the patient was brought the operating room, placed on the operating table in the dorsal supine position, where general LMA anesthesia was introduced. He was then replaced in the dorsal lithotomy position with the pubis was prepped with Betadine solution and draped in the usual fashion.  The armband was double checked. The history was double checked.  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 He is currently on maximal medical therapy, with finasteride and tamsulosin. He has a prostate symptom score of 21, with symptoms of straining to urinate, nocturia, urgency frequency. Cystoscopy shows no median lobe, and bilobar BPH. He has trabeculation, but no cellules. There is no bladder stone or bladder tumor. His peak flow rate is 7 cc/s.   He is a reasonable candidate for a Urolift or a thulium laser procedure. We have discussed both procedures today, and he has selected Urolift because it is the least invasive, with the least chance of bleeding, and can have other prostate surgery done in the future if necessary. He is taking Plavix, and should have medical clearance prior to surgery ( ? can stop Plavix prior to surgery- Dr. [name])    Statement of  Likelihood of Success: Excellent. TIME-OUT observed.:  Procedure:  Cystourethroscopy, was, showing the previously identified short prostatic fossa, with bilobar BPH and elevated bladder neck. The bladder base was identified, and clear reflux was seen from both orifices, as well as a  urethral diverticulum, which appeared to be a Hutch diverticulum on the left side, just to the left of the left ureteral orifice and trigone. No bladder stone or tumor was identified.  4 separate UROLIFT prosthetic sutures were then placed in the prostatic fossa without difficulty. Minimal bleeding was noted. Cystoscopy showed excellent placement.  A 14 French Foley catheter was then placed without difficulty. 10 mL of sterile water was then used to inflate the balloon. The Foley catheter was left to straight drainage. The patient received IV Toradol, and was awakened and taken to recovery room in good condition.

## 2016-11-12 NOTE — Anesthesia Postprocedure Evaluation (Signed)
Anesthesia Post Note  Patient: [name]  Procedure(s) Performed: Procedure(s) (LRB): CYSTOSCOPY WITH INSERTION OF UROLIFT x4 (N/A)  Patient location during evaluation: PACU Anesthesia Type: General Level of consciousness: awake and alert, oriented and patient cooperative Pain management: pain level controlled Vital Signs Assessment: post-procedure vital signs reviewed and stable Respiratory status: spontaneous breathing, nonlabored ventilation and respiratory function stable Cardiovascular status: blood pressure returned to baseline and stable Postop Assessment: no signs of nausea or vomiting Anesthetic complications: no       Last Vitals:  Vitals:   11/12/16 0915 11/12/16 0930  BP: 140/79 (!) 142/86  Pulse: 78 79  Resp: 13 14  Temp: 36.4 C 36.5 C    Last Pain:  Vitals:   11/12/16 0915  TempSrc:   PainSc: 0-No pain                 Hubbert Landrigan,E. Merville Hijazi

## 2016-11-12 NOTE — Interval H&P Note (Signed)
History and Physical Interval Note:  11/12/2016 7:27 AM  [name]  has presented today for surgery, with the diagnosis of BENIGN PROSTATE HYPERPLASIA  The various methods of treatment have been discussed with the patient and family. After consideration of risks, benefits and other options for treatment, the patient has consented to  Procedure(s): CYSTOSCOPY WITH INSERTION OF UROLIFT (N/A) as a surgical intervention .  The patient's history has been reviewed, patient examined, no change in status, stable for surgery.  I have reviewed the patient's chart and labs.  Questions were answered to the patient's satisfaction.     [name] I Rodger Giangregorio  Goals:   71 year old male, failing medical therapy for BPH. He is currently on maximal medical therapy, with finasteride and tamsulosin. He has a prostate symptom score of 21, with symptoms of straining to urinate, nocturia, urgency frequency. Cystoscopy shows no median lobe, and bilobar BPH. He has trabeculation, but no cellules. There is no bladder stone or bladder tumor. His peak flow rate is 7 cc/s. Goal is to reduce his outflow obstruction with UROLIFT, in order to alleviate his LUTS. He will be continued on his tamsulosin and finasteride until he is voiding well. H ewill have foley catheter post op to allow for edema, to be removed at his op visit Thursday.  LIKLIHOOD OF SUCCESS:  Very good  ALTERNATE RX: Thulium laser . Discussed in office. Also TURP, as discussed in office. Urolift is less anesthesia, less invasive, with less side effects. He can have other therapies if necessary.  DISABILITY: Foley cath for 3 dys, then voiding trial. Anticipate post op edema for up to 3 weeks.

## 2016-11-13 ENCOUNTER — Encounter (HOSPITAL_COMMUNITY): Payer: Self-pay | Admitting: Urology

## 2016-11-14 ENCOUNTER — Other Ambulatory Visit: Payer: Self-pay | Admitting: Cardiology

## 2016-11-14 DIAGNOSIS — I739 Peripheral vascular disease, unspecified: Secondary | ICD-10-CM

## 2016-11-15 DIAGNOSIS — E291 Testicular hypofunction: Secondary | ICD-10-CM | POA: Diagnosis not present

## 2016-11-15 DIAGNOSIS — R351 Nocturia: Secondary | ICD-10-CM | POA: Diagnosis not present

## 2016-11-15 DIAGNOSIS — N401 Enlarged prostate with lower urinary tract symptoms: Secondary | ICD-10-CM | POA: Diagnosis not present

## 2016-11-16 ENCOUNTER — Ambulatory Visit: Payer: Medicare Other

## 2016-11-19 ENCOUNTER — Ambulatory Visit (INDEPENDENT_AMBULATORY_CARE_PROVIDER_SITE_OTHER): Payer: Medicare Other

## 2016-11-19 VITALS — BP 110/62 | HR 81 | Temp 97.9°F | Resp 12 | Ht 69.0 in | Wt 216.0 lb

## 2016-11-19 DIAGNOSIS — Z23 Encounter for immunization: Secondary | ICD-10-CM

## 2016-11-19 DIAGNOSIS — Z0000 Encounter for general adult medical examination without abnormal findings: Secondary | ICD-10-CM

## 2016-11-19 NOTE — Patient Instructions (Addendum)
  [name] , Thank you for taking time to come for your Medicare Wellness Visit. I appreciate your ongoing commitment to your health goals. Please review the following plan we discussed and let me know if I can assist you in the future.   Follow up with Dr. Birdie SonsSonnenberg as needed.    Stay hydrated and drink plenty of water.  Follow up with your Cardiologist in regards to the metoprolol medication change.  Have a great day!  These are the goals we discussed: Goals    . Healthy Lifestyle          Stay hydrated and drink plenty of fluids/water. Low carb foods.  Lean meats and vegetables. Stay active. [facility] regimen [facility] the [facility].       This is a list of the screening recommended for you and due dates:  Health Maintenance  Topic Date Due  . Complete foot exam   06[birth year]/1954  . Urine Protein Check  06/30/1954  . Tetanus Vaccine  07/01/1963  . Hemoglobin A1C  06/23/2016  . Flu Shot  02/13/2017  . Eye exam for diabetics  06/26/2017  . Colon Cancer Screening  12/17/2022  .  Hepatitis C: One time screening is recommended by Center for Disease Control  (CDC) for  adults born from [birth year] through 1967.   Completed  . Pneumonia vaccines  Completed

## 2016-11-19 NOTE — Progress Notes (Signed)
Subjective:   [name] is a 71 y.o. male who presents for Medicare Annual/Subsequent preventive examination.  Review of Systems:  No ROS.  Medicare Wellness Visit.  Cardiac Risk Factors include: advanced age (>52men, >9 women);male gender;hypertension     Objective:    Vitals: BP 110/62 (BP Location: Left Arm, Patient Position: Sitting, Cuff Size: Normal)   Pulse 81   Temp 97.9 F (36.6 C) (Oral)   Resp 12   Ht 5\' 9"  (1.753 m)   Wt 216 lb (98 kg)   SpO2 96%   BMI 31.90 kg/m   Body mass index is 31.9 kg/m.  Tobacco History  Smoking Status  . Former Smoker  . Packs/day: 2.00  . Years: 40.00  . Types: Cigarettes  . Quit date: 10/17/1992  Smokeless Tobacco  . Never Used    Comment: quit 1994     Counseling given: Not Answered   Past Medical History:  Diagnosis Date  . Arthritis   . CAD, multiple vessel 03/2105   LM 65%, mLAD 70%, 90% & 80%, o-pCx 75%, mCx 95% & d Cx 70%, OM1 60%, mRCA 90% with 100% dRCA. R-R collaterals  . Chronic distal aortic occlusion (HCC)    By report  . Chronic kidney disease   . COPD (chronic obstructive pulmonary disease) (HCC)   . Depression   . Enlarged prostate   . Erectile dysfunction   . Essential hypertension   . History of kidney stones    multiple lithotripsies  . Hyperlipidemia LDL goal <70   . Hypogonadism in male    Previously on testosterone injections, but has not yet restarted until further testing  . Muscle pain   . PAF (paroxysmal atrial fibrillation) (HCC) 03/2015   Noted per-cath & post-op CABG.  Dr. [name], cardiology  . Peripheral vascular disease (HCC)    Bilateral ABIs 0.8  . Renal stones 03/2016  . S/P CABG x 5 04/04/2015   LIMA to LAD, SVG to DIAG, SVG to OM, seqSVG -PDA- dCx  . SOB (shortness of breath)   . 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 Weakness    weakness bilateral legs, s/p vein harvesting for CABPG   Past Surgical History:  Procedure  Laterality Date  . BLEPHAROPLASTY    . CARDIAC CATHETERIZATION  1992  . CARDIAC CATHETERIZATION N/A 03/31/2015   Procedure: Left Heart Cath and Coronary Angiography;  Surgeon: Marykay Lex, MD;  Location: [facility] INVASIVE CV LAB;  Service: Cardiovascular;  Laterality: N/A;  . CHOLECYSTECTOMY  2000   also had revision of hernia repair  . CORONARY ARTERY BYPASS GRAFT N/A 04/04/2015   Procedure: CORONARY ARTERY BYPASS GRAFTING (CABG);  Surgeon: [name], MD;  Location: [facility] OR;  Service: Open Heart Surgery;  Laterality: N/A;  . CYSTECTOMY     face and scrotum  . CYSTOSCOPY WITH INSERTION OF UROLIFT N/A 11/12/2016   Procedure: CYSTOSCOPY WITH INSERTION OF UROLIFT x4;  Surgeon: Jethro Bolus, MD;  Location: [facility] ORS;  Service: Urology;  Laterality: N/A;  . CYSTOSCOPY WITH RETROGRADE PYELOGRAM, URETEROSCOPY AND STENT PLACEMENT Right 06/26/2016   Procedure: CYSTOSCOPY WITH RIGHT RETROGRADE PYELOGRAM, RIGHT URETEROSCOPY , HOLMIUM LASER, BASKET EXTRACTION AND STENT PLACEMENT;  Surgeon: Jethro Bolus, MD;  Location: [facility] ORS;  Service: Urology;  Laterality: Right;  . Eugenie Birks Myoview  June 2010   converted to [facility] from treadmill due to inability to reach target heart rate) --the study was normal with no evidence  of ischemia and normal LV function. EF 7  . LITHOTRIPSY    . TEE WITHOUT CARDIOVERSION N/A 04/04/2015   Procedure: TRANSESOPHAGEAL ECHOCARDIOGRAM (TEE);  Surgeon: [name], MD;  Location[facility] OR;  Service: Open Heart Surgery;  Laterality: N/A;  . VASECTOMY  1975  . VENTRAL HERNIA REPAIR  1994   complicated by infection   Family History  Problem Relation Age of Onset  . Coronary artery disease Father   . Heart disease Father   . Stroke Mother   . Hearing loss Mother   . Stroke Sister   . Hearing loss Maternal Grandmother   . Prostate cancer Neg Hx   . Kidney disease Neg Hx   . Other Neg Hx     hypogonadism   History  Sexual Activity  . Sexual activity: No     Outpatient Encounter Prescriptions as of 11/19/2016  Medication Sig  . acetaminophen (TYLENOL) 650 MG CR tablet Take 650 mg by mouth daily. Reported on 01/03/2016  . albuterol (PROVENTIL HFA;VENTOLIN HFA) 108 (90 Base) MCG/ACT inhaler Inhale 2 puffs into the lungs every 4 (four) hours as needed for wheezing or shortness of breath.  Marland Kitchen atorvastatin (LIPITOR) 40 MG tablet Take 1 tablet (40 mg total) by mouth daily. (Patient taking differently: Take 40 mg by mouth daily at 6 PM. )  . clopidogrel (PLAVIX) 75 MG tablet Take 1 tablet (75 mg total) by mouth daily.  . diphenhydramine-acetaminophen (TYLENOL PM) 25-500 MG TABS tablet Take 1 tablet by mouth at bedtime as needed (sleep).  . finasteride (PROSCAR) 5 MG tablet TAKE 1 TABLET EVERY DAY  . gabapentin (NEURONTIN) 100 MG capsule Take 1 capsule (100 mg total) by mouth 2 (two) times daily. (Patient taking differently: Take 100 mg by mouth 2 (two) times daily. In the morning and midday)  . gabapentin (NEURONTIN) 400 MG capsule Take 1 capsule (400 mg total) by mouth every evening. (Patient taking differently: Take 400 mg by mouth at bedtime. )  . metoprolol tartrate (LOPRESSOR) 25 MG tablet Take 0.5 tablets (12.5 mg total) by mouth 2 (two) times daily. (Patient taking differently: Take 12.5 mg by mouth daily. )  . NITROSTAT 0.4 MG SL tablet Place 0.4 mg under the tongue every 5 (five) minutes as needed for chest pain. Reported on 01/03/2016  . Omega-3 Fatty Acids (FISH OIL) 1000 MG CAPS Take 1,000 mg by mouth 3 (three) times daily.  . tamsulosin (FLOMAX) 0.4 MG CAPS capsule Take 0.4 mg by mouth daily.   Marland Kitchen testosterone cypionate (DEPOTESTOSTERONE CYPIONATE) 200 MG/ML injection Inject 100 mg into the muscle every 14 (fourteen) days.   . traMADol-acetaminophen (ULTRACET) 37.5-325 MG tablet Take 1 tablet by mouth every 6 (six) hours as needed for moderate pain.  Marland Kitchen trimethoprim (TRIMPEX) 100 MG tablet Take 1 tablet (100 mg total) by mouth 1 day or 1 dose.  .  [DISCONTINUED] phenazopyridine (PYRIDIUM) 200 MG tablet Take 1 tablet (200 mg total) by mouth 3 (three) times daily as needed for pain.   No facility-administered encounter medications on file as of 11/19/2016.     Activities of Daily Living In your present state of health, do you have any difficulty performing the following activities: 11/19/2016 11/07/2016  Hearing? N N  Vision? N N  Difficulty concentrating or making decisions? N N  Walking or climbing stairs? Y Y  Dressing or bathing? N N  Doing errands, shopping? N N  Preparing Food and eating ? N -  Using the Toilet? N -  In the past six months, have you accidently leaked urine? Y -  Do you have problems with loss of bowel control? N -  Managing your Medications? N -  Managing your Finances? N -  Housekeeping or managing your Housekeeping? N -  Some recent data might be hidden    Patient Care Team: Glori LuisSonnenberg, Eric G, MD as PCP - General (Family Medicine)   Assessment:    This is a routine wellness examination for [name]. The goal of the wellness visit is to assist the patient how to close the gaps in care and create a preventative care plan for the patient.   Osteoporosis risk reviewed.  Medications reviewed; taking without issues or barriers.  Safety issues reviewed; smoke detectors in the home. No firearms in the home.  Wears seatbelts when driving or riding with others. Patient does wear sunscreen or protective clothing when in direct sunlight. No violence in the home.  Depression- PHQ 2 &9 complete.  No signs/symptoms or verbal communication regarding little pleasure in doing things, feeling down, depressed or hopeless. No changes in sleeping, energy, eating, concentrating.  No thoughts of self harm or harm towards others.  Time spent on this topic is 8 minutes.   Patient is alert, normal appearance, oriented to person/place/and time. Correctly identified the president of the [place], recall of 3/3 words, and performing  simple calculations.  Patient displays appropriate judgement and can read correct time from watch face.  No new identified risk were noted.  No failures at ADL's or IADL's.   BMI- discussed the importance of a healthy diet, water intake and exercise. Educational material provided.   HTN- followed by PCP.  Dental- every six months.  Dr. Marian SorrowGaylia Smith [facility]  Eye- Visual acuity not assessed per patient preference since they have regular follow up with the ophthalmologist.  Wears corrective lenses.  Sleep patterns- Sleeps 6 hours at night.  Wakes feeling rested.  Pnuemovax 23 administered L deltoid, tolerated well. Educational material provided.  TDAP vaccine deferred per patient preference.  Follow up with insurance.  Educational material provided.  Patient Concerns: None at this time. Follow up with PCP as needed.  Exercise Activities and Dietary recommendations Current Exercise Habits: Home exercise routine, Type of exercise: walking, Time (Minutes): 20, Frequency (Times/Week): 5, Weekly Exercise (Minutes/Week): 100, Intensity: Mild  Goals    . Healthy Lifestyle          Stay hydrated and drink plenty of fluids/water. Low carb foods.  Lean meats and vegetables. Stay active. Maint[facility] regimen at the [facility].      Fall Risk Fall Risk  11/19/2016 02/10/2016 01/11/2016 12/27/2015 12/23/2015  Falls in the past year? No No No No No   Depression Screen PHQ 2/9 Scores 11/19/2016 02/10/2016 01/11/2016 12/27/2015  PHQ - 2 Score 0 0 0 0  PHQ- 9 Score 0 - - -  Exception Documentation - - - -    Cognitive Function MMSE - Mini Mental State Exam 11/17/2015  Orientation to time 5  Orientation to Place 5  Registration 3  Attention/ Calculation 5  Recall 3  Language- name 2 objects 2  Language- repeat 1  Language- follow 3 step command 3  Language- read & follow direction 1  Write a sentence 1  Copy design 1  Total score 30        Immunization History  Administered Date(s)  Administered  . Influenza, High Dose Seasonal PF 03/22/2015, 03/05/2016  . Influenza-Unspecified 02/15/2014  . Pneumococcal Conjugate-13 12/17/2014  .  Pneumococcal Polysaccharide-23 11/19/2016   Screening Tests Health Maintenance  Topic Date Due  . FOOT EXAM  06/30/1954  . URINE MICROALBUMIN  06/30/1954  . TETANUS/TDAP  07/01/1963  . HEMOGLOBIN A1C  06/23/2016  . INFLUENZA VACCINE  02/13/2017  . OPHTHALMOLOGY EXAM  06/26/2017  . COLONOSCOPY  12/17/2022  . Hepatitis C Screening  Completed  . PNA vac Low Risk Adult  Completed      Plan:    End of life planning; Advance aging; Advanced directives discussed. Copy of current HCPOA/Living Will on file.    I have personally reviewed and noted the following in the patient's chart:   . Medical and social history . Use of alcohol, tobacco or illicit drugs  . Current medications and supplements . Functional ability and status . Nutritional status . Physical activity . Advanced directives . List of other physicians . Hospitalizations, surgeries, and ER visits in previous 12 months . Vitals . Screenings to include cognitive, depression, and falls . Referrals and appointments  In addition, I have reviewed and discussed with patient certain preventive protocols, quality metrics, and best practice recommendations. A written personalized care plan for preventive services as well as general preventive health recommendations were provided to patient.     Ashok Pall, LPN  07/21/1094

## 2016-11-19 NOTE — Progress Notes (Signed)
Care was provided under my supervision. I agree with the management as indicated in the note.  Naszir Cott DO  

## 2016-11-22 ENCOUNTER — Encounter (HOSPITAL_COMMUNITY): Payer: Self-pay | Admitting: Urology

## 2016-11-29 ENCOUNTER — Other Ambulatory Visit: Payer: Self-pay | Admitting: Cardiology

## 2016-11-29 ENCOUNTER — Other Ambulatory Visit: Payer: Medicare Other

## 2016-11-29 ENCOUNTER — Ambulatory Visit: Payer: Medicare Other

## 2016-11-29 DIAGNOSIS — I779 Disorder of arteries and arterioles, unspecified: Secondary | ICD-10-CM

## 2016-11-29 DIAGNOSIS — I2510 Atherosclerotic heart disease of native coronary artery without angina pectoris: Secondary | ICD-10-CM

## 2016-11-29 DIAGNOSIS — I739 Peripheral vascular disease, unspecified: Secondary | ICD-10-CM

## 2016-11-29 DIAGNOSIS — E291 Testicular hypofunction: Secondary | ICD-10-CM | POA: Diagnosis not present

## 2016-11-29 DIAGNOSIS — I25119 Atherosclerotic heart disease of native coronary artery with unspecified angina pectoris: Secondary | ICD-10-CM

## 2016-11-29 LAB — VAS US LOWER EXTREMITY ARTERIAL DUPLEX
LEFT PERO DIST DIA: 5 cm/s
LEFT PERO DIST SYS: 25 cm/s
LEFT PERO MID SYS: -13 cm/s
LEFT POPLITEAL PROX DYS VEL: 3 cm/s
LEFT SFA DIST DYS VEL: -3 cm/s
LEFT SFA MID VEL: 0 cm/s
LEFT SFA PROX DYS VEL: 0 cm/s
Left ant tibial mid sys: 0 cm/s
Left ant tibial mid sys: 37 cm/s
Left popliteal dist sys PSV: -22 cm/s
Left popliteal prox sys PSV: 31 cm/s
Left super femoral dist sys PSV: -35 cm/s
Left super femoral mid sys PSV: -48 cm/s
Left super femoral prox sys PSV: -52 cm/s
RIGHT ANT MID TIBIAL SYS PSV: -20 cm/s
RIGHT PERO MID SYS: -16 cm/s
RIGHT POPLITEAL DIST EDV: -3 cm/s
RIGHT POPLITEAL PROX EDV: 0 cm/s
RIGHT POST TIB MID DIA: 0 cm/s
RIGHT POST TIB MID SYS: 40 cm/s
RIGHT SUPER FEMORAL DIST EDV: 0 cm/s
RIGHT SUPER FEMORAL MID EDV: 0 cm/s
RIGHT SUPER FEMORAL PROX EDV: 0 cm/s
Right popliteal dist sys PSV: -36 cm/s
Right popliteal prox sys PSV: 34 cm/s
Right super femoral dist sys PSV: -50 cm/s
Right super femoral mid sys PSV: -72 cm/s
Right super femoral prox sys PSV: 52 cm/s

## 2016-12-04 ENCOUNTER — Other Ambulatory Visit: Payer: Self-pay | Admitting: Family Medicine

## 2016-12-04 DIAGNOSIS — Q251 Coarctation of aorta: Secondary | ICD-10-CM

## 2016-12-05 ENCOUNTER — Other Ambulatory Visit: Payer: Self-pay | Admitting: Family Medicine

## 2016-12-05 DIAGNOSIS — N400 Enlarged prostate without lower urinary tract symptoms: Secondary | ICD-10-CM

## 2016-12-13 DIAGNOSIS — E291 Testicular hypofunction: Secondary | ICD-10-CM | POA: Diagnosis not present

## 2016-12-25 DIAGNOSIS — N401 Enlarged prostate with lower urinary tract symptoms: Secondary | ICD-10-CM | POA: Diagnosis not present

## 2016-12-25 DIAGNOSIS — R351 Nocturia: Secondary | ICD-10-CM | POA: Diagnosis not present

## 2016-12-25 DIAGNOSIS — E291 Testicular hypofunction: Secondary | ICD-10-CM | POA: Diagnosis not present

## 2016-12-27 ENCOUNTER — Ambulatory Visit (INDEPENDENT_AMBULATORY_CARE_PROVIDER_SITE_OTHER): Payer: Medicare Other | Admitting: Family Medicine

## 2016-12-27 ENCOUNTER — Ambulatory Visit: Payer: Self-pay

## 2016-12-27 ENCOUNTER — Encounter: Payer: Self-pay | Admitting: Family Medicine

## 2016-12-27 VITALS — BP 110/68 | HR 64 | Ht 69.0 in | Wt 221.0 lb

## 2016-12-27 DIAGNOSIS — M25551 Pain in right hip: Secondary | ICD-10-CM | POA: Diagnosis not present

## 2016-12-27 DIAGNOSIS — M25552 Pain in left hip: Principal | ICD-10-CM

## 2016-12-27 DIAGNOSIS — M7062 Trochanteric bursitis, left hip: Secondary | ICD-10-CM

## 2016-12-27 DIAGNOSIS — M7061 Trochanteric bursitis, right hip: Secondary | ICD-10-CM | POA: Diagnosis not present

## 2016-12-27 DIAGNOSIS — I779 Disorder of arteries and arterioles, unspecified: Secondary | ICD-10-CM | POA: Diagnosis not present

## 2016-12-27 NOTE — Assessment & Plan Note (Signed)
Repeat injection again today secondary to worsening symptoms. Same management as patient's right side. Continue same medications.

## 2016-12-27 NOTE — Patient Instructions (Signed)
Good to see you  Exercises 3 times a week.  Injected both hips again today  I hoe it helps If pain comes back in 2 weeks write us and then we will move forward with a CT myelogram.  If the injections help we can repeat every 10 weeks

## 2016-12-27 NOTE — Progress Notes (Signed)
[name] [address] [place], [place] [address] Phone: [phone] Subjective:    I'm seeing this patient by the request  of:  Glori Luis, MD   CC: Bilateral hip pain follow-up  [id number]  [name] is a 71 y.o. male coming in with complaint of left hip pain. Has been multiple months.Been greater than 3 months at this time. Patient unfortunately didn't have a CABG procedure done October 25. Has been starting to increase activity with cardiac rehabilitation Patient was last seen 5 months ago. Found them greater trochanteric bursitis. Patient was given injections at that time and states that he is 80% better. Last 3 weeks that unfortunately the pain is starting to come back. Starting affect some daily activities. Waking him up at night.  Past Medical History:  Diagnosis Date  . Arthritis   . CAD, multiple vessel 03/2105   LM 65%, mLAD 70%, 90% & 80%, o-pCx 75%, mCx 95% & d Cx 70%, OM1 60%, mRCA 90% with 100% dRCA. R-R collaterals  . Chronic distal aortic occlusion (HCC)    By report  . Chronic kidney disease   . COPD (chronic obstructive pulmonary disease) (HCC)   . Depression   . Enlarged prostate   . Erectile dysfunction   . Essential hypertension   . History of kidney stones    multiple lithotripsies  . Hyperlipidemia LDL goal <70   . Hypogonadism in male    Previously on testosterone injections, but has not yet restarted until further testing  . Muscle pain   . PAF (paroxysmal atrial fibrillation) (HCC) 03/2015   Noted per-cath & post-op CABG.  Dr. [name], cardiology  . Peripheral vascular disease (HCC)    Bilateral ABIs 0.8  . Renal stones 03/2016  . S/P CABG x 5 04/04/2015   LIMA to LAD, SVG to DIAG, SVG to OM, seqSVG -PDA- dCx  . SOB (shortness of breath)   . 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 Weakness    weakness bilateral legs, s/p vein harvesting for CABPG   Past  Surgical History:  Procedure Laterality Date  . BLEPHAROPLASTY    . CARDIAC CATHETERIZATION  1992  . CARDIAC CATHETERIZATION N/A 03/31/2015   Procedure: Left Heart Cath and Coronary Angiography;  Surgeon: Marykay Lex, MD;  Location: [facility] INVASIVE CV LAB;  Service: Cardiovascular;  Laterality: N/A;  . CHOLECYSTECTOMY  2000   also had revision of hernia repair  . CORONARY ARTERY BYPASS GRAFT N/A 04/04/2015   Procedure: CORONARY ARTERY BYPASS GRAFTING (CABG);  Surgeon: [name], MD;  Location: [facility] OR;  Service: Open Heart Surgery;  Laterality: N/A;  . CYSTECTOMY     face and scrotum  . CYSTOSCOPY WITH INSERTION OF UROLIFT N/A 11/12/2016   Procedure: CYSTOSCOPY WITH INSERTION OF UROLIFT x4;  Surgeon: Jethro Bolus, MD;  Location: [facility] ORS;  Service: Urology;  Laterality: N/A;  . CYSTOSCOPY WITH RETROGRADE PYELOGRAM, URETEROSCOPY AND STENT PLACEMENT Right 06/26/2016   Procedure: CYSTOSCOPY WITH RIGHT RETROGRADE PYELOGRAM, RIGHT URETEROSCOPY , HOLMIUM LASER, BASKET EXTRACTION AND STENT PLACEMENT;  Surgeon: Jethro Bolus, MD;  Location: [facility] ORS;  Service: Urology;  Laterality: Right;  . Eugenie Birks Myoview  June 2010   converted to [facility] from treadmill due to inability to reach target heart rate) --the study was normal with no evidence of ischemia and normal LV function. EF 7  . LITHOTRIPSY    . TEE WITHOUT CARDIOVERSION N/A 04/04/2015  Procedure: TRANSESOPHAGEAL ECHOCARDIOGRAM (TEE);  Surgeon: Delight OvensEdward B Gerhardt, MD;  [facility];  Service: Open Heart Surgery;  Laterality: N/A;  . VASECTOMY  1975  . VENTRAL HERNIA REPAIR  1994   complicated by infection   Social History   Social History  . Marital status: Married    Spouse name: N/A  . Number of children: N/A  . Years of education: N/A   Occupational History  . Not on file.   Social History Main Topics  . Smoking status: Former Smoker    Packs/day: 2.00    Years: 40.00    Types: Cigarettes    Quit date: 10/17/1992    . Smokeless tobacco: Never Used     Comment: quit 1994  . Alcohol use 0.0 oz/week     Comment: seldom  . Drug use: No  . Sexual activity: No   Other Topics Concern  . Not on file   Social History Narrative   [place] in [place]. No pets   Two children.      Former smoker. Quit in 1994. Prior to quitting was unable to walk without exertional dyspnea.      Work - Retired. 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      Hobbies - reading, drives Corvette, chuch   No Known Allergies Family History  Problem Relation Age of Onset  . Coronary artery disease Father   . Heart disease Father   . Stroke Mother   . Hearing loss Mother   . Stroke Sister   . Hearing loss Maternal Grandmother   . Prostate cancer Neg Hx   . Kidney disease Neg Hx   . Other Neg Hx        hypogonadism      Past medical history, social, surgical and family history all reviewed in electronic medical record.   Review of Systems: No headache, visual changes, nausea, vomiting, diarrhea, constipation, dizziness, abdominal pain, skin rash, fevers, chills, night sweats, weight loss, swollen lymph nodes, body aches, joint swelling, chest pain, shortness of breath, mood changes.  Positive muscle aches   Objective  Blood pressure 110/68, pulse 64, height 5\' 9"  (1.753 m), weight 221 lb (100.2 kg).  Systems examined below as of 12/27/16 General: NAD A&O x3 mood, affect normal  HEENT: Pupils equal, extraocular movements intact no nystagmus Respiratory: not short of breath at rest or with speaking Cardiovascular: No lower extremity edema, non tender Skin: Warm dry intact with no signs of infection or rash on extremities or on axial skeleton. Abdomen: Soft nontender, no masses Neuro: Cranial nerves  intact, neurovascularly intact in all extremities with 2+ DTRs and 2+ pulses. Lymph: No lymphadenopathy appreciated today    Gait Antalgic gait still  present MSK: Non tender with full range of motion and good stability and symmetric strength and tone of shoulders, elbows, wrist,  knee and ankles bilaterally.  Arthritic changes of multiple joints Hip: Bilateral ROM IR: 25 Deg, ER: 35 Deg, Flexion: 120 Deg, Extension: 100 Deg, Abduction: 45 Deg, Adduction: 45 Deg Strength IR: 5/5, ER: 5/5, Flexion: 5/5, Extension: 5/5, Abduction: 4/5 but symmetric, Adduction: 5/5 Severe tenderness to palpation and appears palmar musculature of the lateral hip especially the greater trochanter area.   Procedure: Real-time Ultrasound Guided Injection of right greater trochanteric bursitis secondary to patient's body habitus Device: GE Logiq Q7 Ultrasound guided injection is preferred based studies that show increased duration, increased effect, greater accuracy,  decreased procedural pain, increased response rate, and decreased cost with ultrasound guided versus blind injection.  Verbal informed consent obtained.  Time-out conducted.  Noted no overlying erythema, induration, or other signs of local infection.  Skin prepped in a sterile fashion.  Local anesthesia: Topical Ethyl chloride.  With sterile technique and under real time ultrasound guidance:  Greater trochanteric area was visualized and patient's bursa was noted. A 22-gauge 3 inch needle was inserted and 4 cc of 0.5% Marcaine and 1 cc of Kenalog 40 mg/dL was injected. Pictures taken Completed without difficulty  Pain immediately resolved suggesting accurate placement of the medication.  Advised to call if fevers/chills, erythema, induration, drainage, or persistent bleeding.  Images permanently stored and available for review in the ultrasound unit.  Impression: Technically successful ultrasound guided injection.   Procedure: Real-time Ultrasound Guided Injection of left  greater trochanteric bursitis secondary to patient's body habitus Device: GE Logiq Q7  Ultrasound guided injection is preferred  based studies that show increased duration, increased effect, greater accuracy, decreased procedural pain, increased response rate, and decreased cost with ultrasound guided versus blind injection.  Verbal informed consent obtained.  Time-out conducted.  Noted no overlying erythema, induration, or other signs of local infection.  Skin prepped in a sterile fashion.  Local anesthesia: Topical Ethyl chloride.  With sterile technique and under real time ultrasound guidance:  Greater trochanteric area was visualized and patient's bursa was noted. A 22-gauge 3 inch needle was inserted and 4 cc of 0.5% Marcaine and 1 cc of Kenalog 40 mg/dL was injected. Pictures taken Completed without difficulty  Pain immediately resolved suggesting accurate placement of the medication.  Advised to call if fevers/chills, erythema, induration, drainage, or persistent bleeding.  Images permanently stored and available for review in the ultrasound unit.  Impression: Technically successful ultrasound guided injection.     Impression and Recommendations:     This case required medical decision making of moderate complexity.

## 2016-12-27 NOTE — Progress Notes (Signed)
Us/

## 2016-12-27 NOTE — Assessment & Plan Note (Signed)
Patient was given another injection today secondary to worsening symptoms. Tolerated the procedure well. We did bilateral injections. Patient seems to be doing well overall. We discussed the icing regimen. Topical anti-inflammatories. Follow-up again in 4-6 weeks.

## 2016-12-29 ENCOUNTER — Encounter: Payer: Self-pay | Admitting: Emergency Medicine

## 2016-12-29 ENCOUNTER — Observation Stay
Admission: EM | Admit: 2016-12-29 | Discharge: 2016-12-30 | Disposition: A | Discharge status: Home / Self Care | Payer: Medicare Other | Attending: Internal Medicine | Admitting: Internal Medicine

## 2016-12-29 DIAGNOSIS — I2510 Atherosclerotic heart disease of native coronary artery without angina pectoris: Secondary | ICD-10-CM | POA: Insufficient documentation

## 2016-12-29 DIAGNOSIS — Z66 Do not resuscitate: Secondary | ICD-10-CM | POA: Insufficient documentation

## 2016-12-29 DIAGNOSIS — R531 Weakness: Principal | ICD-10-CM | POA: Insufficient documentation

## 2016-12-29 DIAGNOSIS — J9811 Atelectasis: Secondary | ICD-10-CM | POA: Diagnosis not present

## 2016-12-29 DIAGNOSIS — E039 Hypothyroidism, unspecified: Secondary | ICD-10-CM | POA: Diagnosis not present

## 2016-12-29 DIAGNOSIS — J449 Chronic obstructive pulmonary disease, unspecified: Secondary | ICD-10-CM | POA: Diagnosis not present

## 2016-12-29 DIAGNOSIS — N400 Enlarged prostate without lower urinary tract symptoms: Secondary | ICD-10-CM | POA: Diagnosis not present

## 2016-12-29 DIAGNOSIS — E785 Hyperlipidemia, unspecified: Secondary | ICD-10-CM | POA: Insufficient documentation

## 2016-12-29 DIAGNOSIS — R55 Syncope and collapse: Secondary | ICD-10-CM | POA: Diagnosis present

## 2016-12-29 DIAGNOSIS — I493 Ventricular premature depolarization: Secondary | ICD-10-CM | POA: Insufficient documentation

## 2016-12-29 DIAGNOSIS — Z7982 Long term (current) use of aspirin: Secondary | ICD-10-CM | POA: Insufficient documentation

## 2016-12-29 DIAGNOSIS — E291 Testicular hypofunction: Secondary | ICD-10-CM | POA: Insufficient documentation

## 2016-12-29 DIAGNOSIS — Z951 Presence of aortocoronary bypass graft: Secondary | ICD-10-CM | POA: Insufficient documentation

## 2016-12-29 DIAGNOSIS — I129 Hypertensive chronic kidney disease with stage 1 through stage 4 chronic kidney disease, or unspecified chronic kidney disease: Secondary | ICD-10-CM | POA: Insufficient documentation

## 2016-12-29 DIAGNOSIS — I480 Paroxysmal atrial fibrillation: Secondary | ICD-10-CM | POA: Diagnosis not present

## 2016-12-29 DIAGNOSIS — F329 Major depressive disorder, single episode, unspecified: Secondary | ICD-10-CM | POA: Insufficient documentation

## 2016-12-29 DIAGNOSIS — N189 Chronic kidney disease, unspecified: Secondary | ICD-10-CM | POA: Diagnosis not present

## 2016-12-29 DIAGNOSIS — M1990 Unspecified osteoarthritis, unspecified site: Secondary | ICD-10-CM | POA: Insufficient documentation

## 2016-12-29 DIAGNOSIS — Z7902 Long term (current) use of antithrombotics/antiplatelets: Secondary | ICD-10-CM | POA: Diagnosis not present

## 2016-12-29 DIAGNOSIS — Z87891 Personal history of nicotine dependence: Secondary | ICD-10-CM | POA: Diagnosis not present

## 2016-12-29 DIAGNOSIS — R42 Dizziness and giddiness: Secondary | ICD-10-CM | POA: Diagnosis not present

## 2016-12-29 DIAGNOSIS — I739 Peripheral vascular disease, unspecified: Secondary | ICD-10-CM | POA: Insufficient documentation

## 2016-12-29 DIAGNOSIS — I639 Cerebral infarction, unspecified: Secondary | ICD-10-CM

## 2016-12-29 LAB — CBC
HCT: 41.5 % (ref 40.0–52.0)
Hemoglobin: 14.1 g/dL (ref 13.0–18.0)
MCH: 32.3 pg (ref 26.0–34.0)
MCHC: 34 g/dL (ref 32.0–36.0)
MCV: 95 fL (ref 80.0–100.0)
Platelets: 194 10*3/uL (ref 150–440)
RBC: 4.37 MIL/uL — ABNORMAL LOW (ref 4.40–5.90)
RDW: 12.3 % (ref 11.5–14.5)
WBC: 11.2 10*3/uL — ABNORMAL HIGH (ref 3.8–10.6)

## 2016-12-29 LAB — COMPREHENSIVE METABOLIC PANEL
ALT: 19 U/L (ref 17–63)
AST: 21 U/L (ref 15–41)
Albumin: 3.5 g/dL (ref 3.5–5.0)
Alkaline Phosphatase: 55 U/L (ref 38–126)
Anion gap: 6 (ref 5–15)
BUN: 22 mg/dL — ABNORMAL HIGH (ref 6–20)
CO2: 25 mmol/L (ref 22–32)
Calcium: 8.6 mg/dL — ABNORMAL LOW (ref 8.9–10.3)
Chloride: 108 mmol/L (ref 101–111)
Creatinine, Ser: 1.2 mg/dL (ref 0.61–1.24)
GFR calc Af Amer: >60 mL/min (ref >60)
GFR calc non Af Amer: 59 mL/min — ABNORMAL LOW (ref >60)
Glucose, Bld: 172 mg/dL — ABNORMAL HIGH (ref 65–99)
Potassium: 3.9 mmol/L (ref 3.5–5.1)
Sodium: 139 mmol/L (ref 135–145)
Total Bilirubin: 0.5 mg/dL (ref 0.3–1.2)
Total Protein: 6.3 g/dL — ABNORMAL LOW (ref 6.5–8.1)

## 2016-12-29 LAB — TROPONIN I: Troponin I: <0.03 ng/mL (ref <0.03)

## 2016-12-29 NOTE — ED Notes (Signed)
Per EMS pt. Was orthostatic in field.

## 2016-12-29 NOTE — ED Notes (Signed)
Pt. Reports sudden onset of weakness and dizziness that started around 21:00 this evening.  Pt. States hx of 5 by-pass surgeries.

## 2016-12-29 NOTE — ED Triage Notes (Signed)
Pt. Here via EMS from home who reports sudden onset of weakness and dizziness that started about one hour ago.

## 2016-12-29 NOTE — ED Provider Notes (Signed)
[facility] Emergency Department Provider Note   ____________________________________________   First MD Initiated Contact with Patient 12/29/16 2342     (approximate)  I have reviewed the triage vital signs and the nursing notes.   HISTORY  Chief Complaint Weakness (Pt. states sundden onset of weakness and dizziness.)    HPI [name] is a 71 y.o. male brought to the ED from home via EMS with a chief complaint of generalized weakness, dizziness and near syncope.Patient has a history of CAD status post CABG, hypertension, PAD, paroxysmal atrial fibrillation who was in his office when he felt generally weak and dizzy like the room was spinning around him. Subsequently he slumped into his chair and felt like he was going to pass out. Wife states she came into the room and found patient looking pale and clammy. She checked his blood pressure which was 130 diastolic which was unusually high for patient. Denies slurred speech, confusion, extremity weakness. Denies chest pain but states he generally felt uncomfortable and nauseous at the time. Denies recent fever, chills, shortness of breath, abdominal pain, vomiting, diarrhea. Denies recent travel or trauma. EMS reports patient was very orthostatic, dropping his blood pressure 30 points when they stood him up.   Past Medical History:  Diagnosis Date  . Arthritis   . CAD, multiple vessel 03/2105   LM 65%, mLAD 70%, 90% & 80%, o-pCx 75%, mCx 95% & d Cx 70%, OM1 60%, mRCA 90% with 100% dRCA. R-R collaterals  . Chronic distal aortic occlusion (HCC)    By report  . Chronic kidney disease   . COPD (chronic obstructive pulmonary disease) (HCC)   . Depression   . Enlarged prostate   . Erectile dysfunction   . Essential hypertension   . History of kidney stones    multiple lithotripsies  . Hyperlipidemia LDL goal <70   . Hypogonadism in male    Previously on testosterone injections, but has not yet restarted until  further testing  . Muscle pain   . PAF (paroxysmal atrial fibrillation) (HCC) 03/2015   Noted per-cath & post-op CABG.  Dr. [name], cardiology  . Peripheral vascular disease (HCC)    Bilateral ABIs 0.8  . Renal stones 03/2016  . S/P CABG x 5 04/04/2015   LIMA to LAD, SVG to DIAG, SVG to OM, seqSVG -PDA- dCx  . SOB (shortness of breath)   . 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 Weakness    weakness bilateral legs, s/p vein harvesting for CABPG    Patient Active Problem List   Diagnosis Date Noted  . Syncope 12/30/2016  . Lumbar radiculopathy 07/18/2016  . Dyspnea on exertion 02/07/2016  . Solitary pulmonary nodule 01/25/2016  . Nephrolithiasis 01/25/2016  . Multinodular goiter 01/25/2016  . Chest wall deformity 01/11/2016  . Neck swelling 01/11/2016  . COPD (chronic obstructive pulmonary disease) (HCC) 01/11/2016  . Greater trochanteric bursitis of left hip 12/28/2015  . Lightheadedness 12/27/2015  . Hoarseness of voice 11/18/2015  . Hypogonadism in male 11/18/2015  . Greater trochanteric bursitis of right hip 07/15/2015  . Left hip pain 07/06/2015  . Chronic fatigue 06/29/2015  . Insomnia 06/29/2015  . Urinary frequency 06/13/2015  . Nocturia 06/13/2015  . Absolute anemia 05/18/2015  . Dyslipidemia, goal LDL below 70 04/20/2015  . CAD, multiple vessel   . Coronary artery disease involving native coronary artery with angina pectoris (HCC)   . S/P CABG x 5 04/04/2015  .  PAF (paroxysmal atrial fibrillation) (HCC) 03/17/2015  . BPH with obstruction/lower urinary tract symptoms 03/10/2015  . Hypogonadism male 03/04/2015  . Essential hypertension 12/02/2014  . PAD (peripheral artery disease) (HCC) 12/02/2014  . Atherosclerosis of native artery of extremity (HCC) 05/27/2008    Past Surgical History:  Procedure Laterality Date  . BLEPHAROPLASTY    . CARDIAC CATHETERIZATION  1992  . CARDIAC CATHETERIZATION N/A 03/31/2015    Procedure: Left Heart Cath and Coronary Angiography;  Surgeon: Marykay Lexavid W Harding, MD;  [facility];  Service: Cardiovascular;  Laterality: N/A;  . CHOLECYSTECTOMY  2000   also had revision of hernia repair  . CORONARY ARTERY BYPASS GRAFT N/A 04/04/2015   Procedure: CORONARY ARTERY BYPASS GRAFTING (CABG);  Surgeon: Delight OvensEdward B Gerhardt, MD;  [facility];  Service: Open Heart Surgery;  Laterality: N/A;  . CYSTECTOMY     face and scrotum  . CYSTOSCOPY WITH INSERTION OF UROLIFT N/A 11/12/2016   Procedure: CYSTOSCOPY WITH INSERTION OF UROLIFT x4;  Surgeon: Jethro Bolusannenbaum, Sigmund, MD;  Location: [facility] ORS;  Service: Urology;  Laterality: N/A;  . CYSTOSCOPY WITH RETROGRADE PYELOGRAM, URETEROSCOPY AND STENT PLACEMENT Right 06/26/2016   Procedure: CYSTOSCOPY WITH RIGHT RETROGRADE PYELOGRAM, RIGHT URETEROSCOPY , HOLMIUM LASER, BASKET EXTRACTION AND STENT PLACEMENT;  Surgeon: Jethro BolusSigmund Tannenbaum, MD;  Location: [facility] ORS;  Service: Urology;  Laterality: Right;  . Eugenie BirksLexiscan Myoview  June 2010   [facility] inability to reach target heart rate) --the study was normal with no evidence of ischemia and normal LV function. EF 7  . LITHOTRIPSY    . TEE WITHOUT CARDIOVERSION N/A 04/04/2015   Procedure: TRANSESOPHAGEAL ECHOCARDIOGRAM (TEE);  Surgeon: Delight OvensEdward B Gerhardt, MD;  [facility];  Service: Open Heart Surgery;  Laterality: N/A;  . VASECTOMY  1975  . VENTRAL HERNIA REPAIR  1994   complicated by infection    Prior to Admission medications   Medication Sig Start Date End Date Taking? Authorizing Provider  acetaminophen (TYLENOL) 650 MG CR tablet Take 650 mg by mouth daily. Reported on 01/03/2016   Yes [provider]  albuterol (PROVENTIL HFA;VENTOLIN HFA) 108 (90 Base) MCG/ACT inhaler Inhale 2 puffs into the lungs every 4 (four) hours as needed for wheezing or shortness of breath. 10/23/16  Yes Merwyn KatosSimonds, David B, MD  atorvastatin (LIPITOR) 40 MG tablet Take 1  tablet (40 mg total) by mouth daily. Patient taking differently: Take 40 mg by mouth daily at 6 PM.  01/25/16  Yes Shelia MediaWalker, Jennifer A, MD  clopidogrel (PLAVIX) 75 MG tablet Take 1 tablet (75 mg total) by mouth daily. 08/03/16  Yes Glori LuisSonnenberg, Eric G, MD  diphenhydramine-acetaminophen (TYLENOL PM) 25-500 MG TABS tablet Take 1 tablet by mouth at bedtime as needed (sleep).   Yes [provider]  finasteride (PROSCAR) 5 MG tablet TAKE 1 TABLET EVERY DAY 12/06/16  Yes Glori LuisSonnenberg, Eric G, MD  gabapentin (NEURONTIN) 100 MG capsule Take 1 capsule (100 mg total) by mouth 2 (two) times daily. Patient taking differently: Take 100 mg by mouth 2 (two) times daily. In the morning and midday 09/17/16  Yes Antoine PrimasSmith, Zachary M, DO  gabapentin (NEURONTIN) 400 MG capsule Take 1 capsule (400 mg total) by mouth every evening. Patient taking differently: Take 400 mg by mouth at bedtime.  10/03/16  Yes Judi SaaSmith, Zachary M, DO  metoprolol tartrate (LOPRESSOR) 25 MG tablet Take 0.5 tablets (12.5 mg total) by mouth 2 (two) times daily. Patient taking differently: Take 12.5 mg by  mouth daily.  08/03/16  Yes Glori Luis, MD  NITROSTAT 0.4 MG SL tablet Place 0.4 mg under the tongue every 5 (five) minutes as needed for chest pain. Reported on 01/03/2016 03/28/15  Yes [provider]  Omega-3 Fatty Acids (FISH OIL) 1000 MG CAPS Take 1,000 mg by mouth 3 (three) times daily.   Yes [provider]  tamsulosin (FLOMAX) 0.4 MG CAPS capsule Take 0.4 mg by mouth daily.  05/02/16  Yes [provider]  testosterone cypionate (DEPOTESTOSTERONE CYPIONATE) 200 MG/ML injection Inject 100 mg into the muscle every 14 (fourteen) days.  10/24/16  Yes [provider]  traMADol-acetaminophen (ULTRACET) 37.5-325 MG tablet Take 1 tablet by mouth every 6 (six) hours as needed for moderate pain. 11/12/16  Yes Jethro Bolus, MD  trimethoprim (TRIMPEX) 100 MG tablet Take 1 tablet (100 mg total) by mouth 1 day or 1  dose. 11/12/16   Jethro Bolus, MD    Allergies Patient has no known allergies.  Family History  Problem Relation Age of Onset  . Coronary artery disease Father   . Heart disease Father   . Stroke Mother   . Hearing loss Mother   . Stroke Sister   . Hearing loss Maternal Grandmother   . Prostate cancer Neg Hx   . Kidney disease Neg Hx   . Other Neg Hx        hypogonadism    Social History Social History  Substance Use Topics  . Smoking status: Former Smoker    Packs/day: 2.00    Years: 40.00    Types: Cigarettes    Quit date: 10/17/1992  . Smokeless tobacco: Never Used     Comment: quit 1994  . Alcohol use 0.0 oz/week     Comment: seldom    Review of Systems  Constitutional: Positive for generalized weakness. No fever/chills. Eyes: No visual changes. ENT: No sore throat. Cardiovascular: Denies chest pain. Respiratory: Denies shortness of breath. Gastrointestinal: No abdominal pain.  No nausea, no vomiting.  No diarrhea.  No constipation. Genitourinary: Negative for dysuria. Musculoskeletal: Negative for back pain. Skin: Negative for rash. Neurological: Positive for lightheadedness and dizziness. Negative for headaches, focal weakness or numbness.   ____________________________________________   PHYSICAL EXAM:  VITAL SIGNS: ED Triage Vitals  Enc Vitals Group     BP 12/29/16 2230 (!) 153/68     Pulse Rate 12/29/16 2230 82     Resp 12/29/16 2230 13     Temp 12/29/16 2230 97.6 F (36.4 C)     Temp Source 12/29/16 2230 Oral     SpO2 12/29/16 2225 97 %     Weight 12/29/16 2231 215 lb (97.5 kg)     Height 12/29/16 2231 5\' 9"  (1.753 m)     Head Circumference --      Peak Flow --      Pain Score --      Pain Loc --      Pain Edu? --      Excl. in GC? --     Constitutional: Alert and oriented. Well appearing and in no acute distress. Eyes: Conjunctivae are normal. PERRL. EOMI. Head: Atraumatic. Nose: No congestion/rhinnorhea. Mouth/Throat: Mucous  membranes are moist.  Oropharynx non-erythematous. Neck: No stridor.  No carotid bruits. Cardiovascular: Normal rate, regular rhythm. Grossly normal heart sounds.  Good peripheral circulation. Respiratory: Normal respiratory effort.  No retractions. Lungs CTAB. Gastrointestinal: Soft and nontender. No distention. No abdominal bruits. No CVA tenderness. Musculoskeletal: No lower extremity tenderness  nor edema.  No joint effusions. Neurologic:  Normal speech and language. No gross focal neurologic deficits are appreciated.  Skin:  Skin is warm, dry and intact. No rash noted. Psychiatric: Mood and affect are normal. Speech and behavior are normal.  ____________________________________________   LABS (all labs ordered are listed, but only abnormal results are displayed)  Labs Reviewed  CBC - Abnormal; Notable for the following:       Result Value   WBC 11.2 (*)    RBC 4.37 (*)    All other components within normal limits  URINALYSIS, COMPLETE (UACMP) WITH MICROSCOPIC - Abnormal; Notable for the following:    Color, Urine YELLOW (*)    APPearance CLEAR (*)    All other components within normal limits  COMPREHENSIVE METABOLIC PANEL - Abnormal; Notable for the following:    Glucose, Bld 172 (*)    BUN 22 (*)    Calcium 8.6 (*)    Total Protein 6.3 (*)    GFR calc non Af Amer 59 (*)    All other components within normal limits  TROPONIN I  TSH  HEMOGLOBIN A1C  CBG MONITORING, ED   ____________________________________________  EKG  ED ECG REPORT I, Dantre Yearwood J, the attending physician, personally viewed and interpreted this ECG.   Date: 12/29/2016  EKG Time: 2225  Rate: 84  Rhythm: normal EKG, normal sinus rhythm  Axis: Normal  Intervals:none  ST&T Change: Nonspecific  ____________________________________________  RADIOLOGY  Dg Chest Port 1 View  Result Date: 12/30/2016 CLINICAL DATA:  Sudden onset of weakness and dizziness EXAM: PORTABLE CHEST 1 VIEW COMPARISON:   08/23/2016 FINDINGS: Post sternotomy changes. Streaky atelectasis or scarring at the bases. No pleural effusion. Stable borderline cardiomegaly with mild atherosclerosis. No pneumothorax. IMPRESSION: Streaky bibasilar atelectasis or scar.  No edema or infiltrate. Electronically Signed   By: Jasmine Pang M.D.   On: 12/30/2016 01:12    ____________________________________________   PROCEDURES  Procedure(s) performed: None  Procedures  Critical Care performed: No  ____________________________________________   INITIAL IMPRESSION / ASSESSMENT AND PLAN / ED COURSE  Pertinent labs & imaging results that were available during my care of the patient were reviewed by me and considered in my medical decision making (see chart for details).  71 year old male with an extensive cardiac history who presents with generalized weakness and near-syncope. Received IV fluids per EMS and currently feels better. Initial EKG and troponin are remarkable. Will administer baby aspirin and discuss with hospitalist evaluate patient in the emergency department for admission.      ____________________________________________   FINAL CLINICAL IMPRESSION(S) / ED DIAGNOSES  Final diagnoses:  Generalized weakness  Near syncope      NEW MEDICATIONS STARTED DURING THIS VISIT:  Current Discharge Medication List       Note:  This document was prepared using Dragon voice recognition software and may include unintentional dictation errors.    Irean Hong, MD 12/30/16 [phone]

## 2016-12-30 ENCOUNTER — Observation Stay: Payer: Medicare Other

## 2016-12-30 ENCOUNTER — Observation Stay (HOSPITAL_BASED_OUTPATIENT_CLINIC_OR_DEPARTMENT_OTHER)
Admit: 2016-12-30 | Discharge: 2016-12-30 | Disposition: A | Discharge status: Home / Self Care | Payer: Medicare Other | Attending: Internal Medicine | Admitting: Internal Medicine

## 2016-12-30 ENCOUNTER — Emergency Department: Payer: Medicare Other

## 2016-12-30 ENCOUNTER — Telehealth: Payer: Self-pay | Admitting: Physician Assistant

## 2016-12-30 DIAGNOSIS — R42 Dizziness and giddiness: Secondary | ICD-10-CM | POA: Diagnosis not present

## 2016-12-30 DIAGNOSIS — I2510 Atherosclerotic heart disease of native coronary artery without angina pectoris: Secondary | ICD-10-CM | POA: Diagnosis not present

## 2016-12-30 DIAGNOSIS — J9811 Atelectasis: Secondary | ICD-10-CM | POA: Diagnosis not present

## 2016-12-30 DIAGNOSIS — I25118 Atherosclerotic heart disease of native coronary artery with other forms of angina pectoris: Secondary | ICD-10-CM

## 2016-12-30 DIAGNOSIS — Z951 Presence of aortocoronary bypass graft: Secondary | ICD-10-CM | POA: Diagnosis not present

## 2016-12-30 DIAGNOSIS — R0602 Shortness of breath: Secondary | ICD-10-CM

## 2016-12-30 DIAGNOSIS — I63233 Cerebral infarction due to unspecified occlusion or stenosis of bilateral carotid arteries: Secondary | ICD-10-CM | POA: Diagnosis not present

## 2016-12-30 DIAGNOSIS — R55 Syncope and collapse: Secondary | ICD-10-CM | POA: Diagnosis not present

## 2016-12-30 DIAGNOSIS — R531 Weakness: Secondary | ICD-10-CM | POA: Diagnosis not present

## 2016-12-30 DIAGNOSIS — J432 Centrilobular emphysema: Secondary | ICD-10-CM

## 2016-12-30 DIAGNOSIS — R0981 Nasal congestion: Secondary | ICD-10-CM | POA: Diagnosis not present

## 2016-12-30 DIAGNOSIS — I639 Cerebral infarction, unspecified: Secondary | ICD-10-CM | POA: Diagnosis not present

## 2016-12-30 DIAGNOSIS — I5030 Unspecified diastolic (congestive) heart failure: Secondary | ICD-10-CM

## 2016-12-30 HISTORY — PX: TRANSTHORACIC ECHOCARDIOGRAM: SHX275

## 2016-12-30 LAB — URINALYSIS, COMPLETE (UACMP) WITH MICROSCOPIC
Bacteria, UA: NONE SEEN
Bilirubin Urine: NEGATIVE
Glucose, UA: NEGATIVE mg/dL
Hgb urine dipstick: NEGATIVE
Ketones, ur: NEGATIVE mg/dL
Leukocytes, UA: NEGATIVE
Nitrite: NEGATIVE
Protein, ur: NEGATIVE mg/dL
Specific Gravity, Urine: 1.021 (ref 1.005–1.030)
Squamous Epithelial / LPF: NONE SEEN
pH: 5 (ref 5.0–8.0)

## 2016-12-30 LAB — TROPONIN I: Troponin I: <0.03 ng/mL (ref <0.03)

## 2016-12-30 LAB — ECHOCARDIOGRAM COMPLETE
Height: 69 in
Weight: 3492.8 oz

## 2016-12-30 LAB — TSH: TSH: 2.753 u[IU]/mL (ref 0.350–4.500)

## 2016-12-30 MED ORDER — TRIMETHOPRIM 100 MG PO TABS: 100.0000 mg | ORAL_TABLET | ORAL | Status: DC

## 2016-12-30 MED ORDER — AMOXICILLIN-POT CLAVULANATE 875-125 MG PO TABS
1.0000 | ORAL_TABLET | Freq: Two times a day (BID) | ORAL | Status: DC
  Administered 2016-12-30: 1 via ORAL
  Filled 2016-12-30: qty 1

## 2016-12-30 MED ORDER — TRAMADOL-ACETAMINOPHEN 37.5-325 MG PO TABS: 1.0000 | ORAL_TABLET | Freq: Four times a day (QID) | ORAL | Status: DC | PRN

## 2016-12-30 MED ORDER — SODIUM CHLORIDE 0.9 % IV BOLUS (SEPSIS)
500.0000 mL | Freq: Once | INTRAVENOUS | Status: AC
  Administered 2016-12-30: 500 mL via INTRAVENOUS

## 2016-12-30 MED ORDER — AMOXICILLIN-POT CLAVULANATE 875-125 MG PO TABS: 1.0000 | ORAL_TABLET | Freq: Two times a day (BID) | ORAL | 0 refills | Status: DC

## 2016-12-30 MED ORDER — FLUTICASONE PROPIONATE 50 MCG/ACT NA SUSP: 2.0000 | Freq: Every day | NASAL | 2 refills | Status: DC

## 2016-12-30 MED ORDER — TAMSULOSIN HCL 0.4 MG PO CAPS
0.4000 mg | ORAL_CAPSULE | Freq: Every day | ORAL | Status: DC
  Administered 2016-12-30: 0.4 mg via ORAL
  Filled 2016-12-30: qty 1

## 2016-12-30 MED ORDER — GABAPENTIN 100 MG PO CAPS
100.0000 mg | ORAL_CAPSULE | Freq: Two times a day (BID) | ORAL | Status: DC
  Administered 2016-12-30: 100 mg via ORAL
  Filled 2016-12-30: qty 1

## 2016-12-30 MED ORDER — ACETAMINOPHEN 325 MG PO TABS: 650.0000 mg | ORAL_TABLET | Freq: Four times a day (QID) | ORAL | Status: DC | PRN

## 2016-12-30 MED ORDER — ASPIRIN 81 MG PO CHEW
324.0000 mg | CHEWABLE_TABLET | Freq: Once | ORAL | Status: AC
  Administered 2016-12-30: 324 mg via ORAL
  Filled 2016-12-30: qty 4

## 2016-12-30 MED ORDER — ASPIRIN EC 81 MG PO TBEC
81.0000 mg | DELAYED_RELEASE_TABLET | Freq: Every day | ORAL | Status: DC
  Administered 2016-12-30: 81 mg via ORAL
  Filled 2016-12-30: qty 1

## 2016-12-30 MED ORDER — ONDANSETRON HCL 4 MG PO TABS: 4.0000 mg | ORAL_TABLET | Freq: Four times a day (QID) | ORAL | Status: DC | PRN

## 2016-12-30 MED ORDER — DOCUSATE SODIUM 100 MG PO CAPS
100.0000 mg | ORAL_CAPSULE | Freq: Two times a day (BID) | ORAL | Status: DC
  Administered 2016-12-30: 100 mg via ORAL
  Filled 2016-12-30: qty 1

## 2016-12-30 MED ORDER — DIPHENHYDRAMINE-APAP (SLEEP) 25-500 MG PO TABS: 1.0000 | ORAL_TABLET | Freq: Every evening | ORAL | Status: DC | PRN

## 2016-12-30 MED ORDER — CLOPIDOGREL BISULFATE 75 MG PO TABS
75.0000 mg | ORAL_TABLET | Freq: Every day | ORAL | Status: DC
  Administered 2016-12-30: 75 mg via ORAL
  Filled 2016-12-30: qty 1

## 2016-12-30 MED ORDER — ACETAMINOPHEN 325 MG PO TABS
650.0000 mg | ORAL_TABLET | Freq: Every day | ORAL | Status: DC
  Administered 2016-12-30: 650 mg via ORAL
  Filled 2016-12-30: qty 2

## 2016-12-30 MED ORDER — ATORVASTATIN CALCIUM 20 MG PO TABS
40.0000 mg | ORAL_TABLET | Freq: Every day | ORAL | Status: DC
Start: 2016-12-30 — End: 2016-12-30

## 2016-12-30 MED ORDER — OMEGA-3-ACID ETHYL ESTERS 1 G PO CAPS
1.0000 g | ORAL_CAPSULE | Freq: Two times a day (BID) | ORAL | Status: DC
  Administered 2016-12-30: 1 g via ORAL
  Filled 2016-12-30: qty 1

## 2016-12-30 MED ORDER — SODIUM CHLORIDE 0.9 % IV SOLN
3.0000 g | Freq: Four times a day (QID) | INTRAVENOUS | Status: DC
  Filled 2016-12-30 (×4): qty 3

## 2016-12-30 MED ORDER — ACETAMINOPHEN 650 MG RE SUPP: 650.0000 mg | Freq: Four times a day (QID) | RECTAL | Status: DC | PRN

## 2016-12-30 MED ORDER — NITROGLYCERIN 0.4 MG SL SUBL: 0.4000 mg | SUBLINGUAL_TABLET | SUBLINGUAL | Status: DC | PRN

## 2016-12-30 MED ORDER — GABAPENTIN 400 MG PO CAPS: 400.0000 mg | ORAL_CAPSULE | Freq: Every day | ORAL | Status: DC

## 2016-12-30 MED ORDER — DIPHENHYDRAMINE HCL 25 MG PO CAPS: 25.0000 mg | ORAL_CAPSULE | Freq: Every evening | ORAL | Status: DC | PRN

## 2016-12-30 MED ORDER — FINASTERIDE 5 MG PO TABS
5.0000 mg | ORAL_TABLET | Freq: Every day | ORAL | Status: DC
  Administered 2016-12-30: 5 mg via ORAL
  Filled 2016-12-30: qty 1

## 2016-12-30 MED ORDER — ENOXAPARIN SODIUM 40 MG/0.4ML ~~LOC~~ SOLN: 40.0000 mg | SUBCUTANEOUS | Status: DC

## 2016-12-30 MED ORDER — ONDANSETRON HCL 4 MG/2ML IJ SOLN: 4.0000 mg | Freq: Four times a day (QID) | INTRAMUSCULAR | Status: DC | PRN

## 2016-12-30 MED ORDER — AMOXICILLIN-POT CLAVULANATE 875-125 MG PO TABS: 1.0000 | ORAL_TABLET | Freq: Two times a day (BID) | ORAL | Status: DC

## 2016-12-30 MED ORDER — ACETAMINOPHEN 500 MG PO TABS: 500.0000 mg | ORAL_TABLET | Freq: Every evening | ORAL | Status: DC | PRN

## 2016-12-30 MED ORDER — METOPROLOL TARTRATE 25 MG PO TABS
12.5000 mg | ORAL_TABLET | Freq: Every day | ORAL | Status: DC
  Administered 2016-12-30: 12.5 mg via ORAL
  Filled 2016-12-30: qty 1

## 2016-12-30 MED ORDER — FLUTICASONE PROPIONATE 50 MCG/ACT NA SUSP
2.0000 | Freq: Every day | NASAL | Status: DC
  Administered 2016-12-30: 2 via NASAL
  Filled 2016-12-30: qty 16

## 2016-12-30 NOTE — Discharge Instructions (Addendum)
[employer] [facility] [facility] [place] [facility]  DIET:  Cardiac diet  DISCHARGE CONDITION:  Stable  ACTIVITY:  Activity as tolerated  OXYGEN:  Home Oxygen: No.   Oxygen Delivery: room air  DISCHARGE LOCATION:  home    ADDITIONAL DISCHARGE INSTRUCTION: please wear ted hose all times   If you experience worsening of your admission symptoms, develop shortness of breath, life threatening emergency, suicidal or homicidal thoughts you must seek medical attention immediately by calling 911 or calling your MD immediately  if symptoms less severe.  You Must read complete instructions/literature along with all the possible adverse reactions/side effects for all the Medicines you take and that have been prescribed to you. Take any new Medicines after you have completely understood and accpet all the possible adverse reactions/side effects.   Please note  You were cared for by a hospitalist during your hospital stay. If you have any questions about your discharge medications or the care you received while you were in the hospital after you are discharged, you can call the unit and asked to speak with the hospitalist on call if the hospitalist that took care of you is not available. Once you are discharged, your primary care physician will handle any further medical issues. Please note that NO REFILLS for any discharge medications will be authorized once you are discharged, as it is imperative that you return to your primary care physician (or establish a relationship with a primary care physician if you do not have one) for your aftercare needs so that they can reassess your need for medications and monitor your lab values.

## 2016-12-30 NOTE — Progress Notes (Signed)
OK to discharged per Dr. Mariah MillingGollan and Dr. Allena KatzPatel / Discharge instructions explained to pt and pts wife/ verbalized an understanding/ iv and tele removed/ transported off unit via wheelchair.

## 2016-12-30 NOTE — H&P (Signed)
[name] is an 71 y.o. male.   Chief Complaint: Fainting HPI: The patient with past medical history of coronary artery disease status post CABG, COPD and hypertension presents to the emergency department complaining of an episode of syncope. The patient states that he was reclining on the couch when he sat up and felt copious nasal drainage. He tried to blow his nose and became acutely dizzy. He was able skin indicates his wife he is feeling bad. His wife took his blood pressure which showed systolic greater than 655. He tried to adjust himself in preparation to stand and became very lightheaded. After this his recollection of events is unclear but he was reported found by EMS to be barely conscious. He was also found to be orthostatic. In the emergency department the patient denied palpitations or chest pain. He also denies shortness of breath but admits to dyspnea on exertion. This is a chronic problem. Due to his risk factors and recent syncope emergency department staff called the hospitalist service for admission.  Past Medical History:  Diagnosis Date  . Arthritis   . CAD, multiple vessel 03/2105   LM 65%, mLAD 70%, 90% & 80%, o-pCx 75%, mCx 95% & d Cx 70%, OM1 60%, mRCA 90% with 100% dRCA. R-R collaterals  . Chronic distal aortic occlusion (HCC)    By report  . Chronic kidney disease   . COPD (chronic obstructive pulmonary disease) ([place])   . Depression   . Enlarged prostate   . Erectile dysfunction   . Essential hypertension   . History of kidney stones    multiple lithotripsies  . Hyperlipidemia LDL goal <70   . Hypogonadism in male    Previously on testosterone injections, but has not yet restarted until further testing  . Muscle pain   . PAF (paroxysmal atrial fibrillation) ([place]) 03/2015   Noted per-cath & post-op CABG.  Dr. Ellyn Hack, cardiology  . Peripheral vascular disease (HCC)    Bilateral ABIs 0.8  . Renal stones 03/2016  . S/P CABG x 5 04/04/2015   LIMA to LAD, SVG to  DIAG, SVG to OM, seqSVG -PDA- dCx  . SOB (shortness of breath)   . 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 Weakness    weakness bilateral legs, s/p vein harvesting for CABPG    Past Surgical History:  Procedure Laterality Date  . BLEPHAROPLASTY    . CARDIAC CATHETERIZATION  1992  . CARDIAC CATHETERIZATION N/A 03/31/2015   Procedure: Left Heart Cath and Coronary Angiography;  Surgeon: Leonie Man, MD;  Location: [facility];  Service: Cardiovascular;  Laterality: N/A;  . CHOLECYSTECTOMY  2000   also had revision of hernia repair  . CORONARY ARTERY BYPASS GRAFT N/A 04/04/2015   Procedure: CORONARY ARTERY BYPASS GRAFTING (CABG);  Surgeon: Grace Isaac, MD;  Location: [facility];  Service: Open Heart Surgery;  Laterality: N/A;  . CYSTECTOMY     face and scrotum  . CYSTOSCOPY WITH INSERTION OF UROLIFT N/A 11/12/2016   Procedure: CYSTOSCOPY WITH INSERTION OF UROLIFT x4;  Surgeon: Carolan Clines, MD;  Location: [facility];  Service: Urology;  Laterality: N/A;  . CYSTOSCOPY WITH RETROGRADE PYELOGRAM, URETEROSCOPY AND STENT PLACEMENT Right 06/26/2016   Procedure: CYSTOSCOPY WITH RIGHT RETROGRADE PYELOGRAM, RIGHT URETEROSCOPY , HOLMIUM LASER, BASKET EXTRACTION AND STENT PLACEMENT;  Surgeon: Carolan Clines, MD;  Location: [facility];  Service: Urology;  Laterality: Right;  Carlton Adam Myoview  June 2010  converted to Lexiscan from treadmill due to inability to reach target heart rate) --the study was normal with no evidence of ischemia and normal LV function. EF 7  . LITHOTRIPSY    . TEE WITHOUT CARDIOVERSION N/A 04/04/2015   Procedure: TRANSESOPHAGEAL ECHOCARDIOGRAM (TEE);  Surgeon: Grace Isaac, MD;  Location: [facility];  Service: Open Heart Surgery;  Laterality: N/A;  . [place]  . VENTRAL HERNIA REPAIR  5885   complicated by infection    Family History  Problem Relation Age of Onset  . Coronary artery disease Father   . Heart  disease Father   . Stroke Mother   . Hearing loss Mother   . Stroke Sister   . Hearing loss Maternal Grandmother   . Prostate cancer Neg Hx   . Kidney disease Neg Hx   . Other Neg Hx        hypogonadism   Social History:  reports that he quit smoking about 24 years ago. His smoking use included Cigarettes. He has a 80.00 pack-year smoking history. He has never used smokeless tobacco. He reports that he drinks alcohol. He reports that he does not use drugs.  Allergies: No Known Allergies  Medications Prior to Admission  Medication Sig Dispense Refill  . acetaminophen (TYLENOL) 650 MG CR tablet Take 650 mg by mouth daily. Reported on 01/03/2016    . albuterol (PROVENTIL HFA;VENTOLIN HFA) 108 (90 Base) MCG/ACT inhaler Inhale 2 puffs into the lungs every 4 (four) hours as needed for wheezing or shortness of breath. 1 Inhaler 6  . atorvastatin (LIPITOR) 40 MG tablet Take 1 tablet (40 mg total) by mouth daily. (Patient taking differently: Take 40 mg by mouth daily at 6 PM. ) 90 tablet 3  . clopidogrel (PLAVIX) 75 MG tablet Take 1 tablet (75 mg total) by mouth daily. 90 tablet 3  . diphenhydramine-acetaminophen (TYLENOL PM) 25-500 MG TABS tablet Take 1 tablet by mouth at bedtime as needed (sleep).    . finasteride (PROSCAR) 5 MG tablet TAKE 1 TABLET EVERY DAY 90 tablet 0  . gabapentin (NEURONTIN) 100 MG capsule Take 1 capsule (100 mg total) by mouth 2 (two) times daily. (Patient taking differently: Take 100 mg by mouth 2 (two) times daily. In the morning and midday) 180 capsule 1  . gabapentin (NEURONTIN) 400 MG capsule Take 1 capsule (400 mg total) by mouth every evening. (Patient taking differently: Take 400 mg by mouth at bedtime. ) 90 capsule 1  . metoprolol tartrate (LOPRESSOR) 25 MG tablet Take 0.5 tablets (12.5 mg total) by mouth 2 (two) times daily. (Patient taking differently: Take 12.5 mg by mouth daily. ) 90 tablet 3  . NITROSTAT 0.4 MG SL tablet Place 0.4 mg under the tongue every 5  (five) minutes as needed for chest pain. Reported on 01/03/2016    . Omega-3 Fatty Acids (FISH OIL) 1000 MG CAPS Take 1,000 mg by mouth 3 (three) times daily.    . tamsulosin (FLOMAX) 0.4 MG CAPS capsule Take 0.4 mg by mouth daily.     Marland Kitchen testosterone cypionate (DEPOTESTOSTERONE CYPIONATE) 200 MG/ML injection Inject 100 mg into the muscle every 14 (fourteen) days.     . traMADol-acetaminophen (ULTRACET) 37.5-325 MG tablet Take 1 tablet by mouth every 6 (six) hours as needed for moderate pain. 30 tablet 0  . trimethoprim (TRIMPEX) 100 MG tablet Take 1 tablet (100 mg total) by mouth 1 day or 1 dose. 30 tablet 0    Results for orders placed  or performed during the hospital encounter of 12/29/16 (from the past 48 hour(s))  CBC     Status: Abnormal   Collection Time: 12/29/16 10:33 PM  Result Value Ref Range   WBC 11.2 (H) 3.8 - 10.6 K/uL   RBC 4.37 (L) 4.40 - 5.90 MIL/uL   Hemoglobin 14.1 13.0 - 18.0 g/dL   HCT 41.5 40.0 - 52.0 %   MCV 95.0 80.0 - 100.0 fL   MCH 32.3 26.0 - 34.0 pg   MCHC 34.0 32.0 - 36.0 g/dL   RDW 12.3 11.5 - 14.5 %   Platelets 194 150 - 440 K/uL  Urinalysis, Complete w Microscopic     Status: Abnormal   Collection Time: 12/29/16 10:33 PM  Result Value Ref Range   Color, Urine YELLOW (A) YELLOW   APPearance CLEAR (A) CLEAR   Specific Gravity, Urine 1.021 1.005 - 1.030   pH 5.0 5.0 - 8.0   Glucose, UA NEGATIVE NEGATIVE mg/dL   Hgb urine dipstick NEGATIVE NEGATIVE   Bilirubin Urine NEGATIVE NEGATIVE   Ketones, ur NEGATIVE NEGATIVE mg/dL   Protein, ur NEGATIVE NEGATIVE mg/dL   Nitrite NEGATIVE NEGATIVE   Leukocytes, UA NEGATIVE NEGATIVE   RBC / HPF 0-5 0 - 5 RBC/hpf   WBC, UA 0-5 0 - 5 WBC/hpf   Bacteria, UA NONE SEEN NONE SEEN   Squamous Epithelial / LPF NONE SEEN NONE SEEN   Mucous PRESENT   Comprehensive metabolic panel     Status: Abnormal   Collection Time: 12/29/16 10:33 PM  Result Value Ref Range   Sodium 139 135 - 145 mmol/L   Potassium 3.9 3.5 - 5.1  mmol/L   Chloride 108 101 - 111 mmol/L   CO2 25 22 - 32 mmol/L   Glucose, Bld 172 (H) 65 - 99 mg/dL   BUN 22 (H) 6 - 20 mg/dL   Creatinine, Ser 1.20 0.61 - 1.24 mg/dL   Calcium 8.6 (L) 8.9 - 10.3 mg/dL   Total Protein 6.3 (L) 6.5 - 8.1 g/dL   Albumin 3.5 3.5 - 5.0 g/dL   AST 21 15 - 41 U/L   ALT 19 17 - 63 U/L   Alkaline Phosphatase 55 38 - 126 U/L   Total Bilirubin 0.5 0.3 - 1.2 mg/dL   GFR calc non Af Amer 59 (L) >60 mL/min   GFR calc Af Amer >60 >60 mL/min    Comment: (NOTE) The eGFR has been calculated using the CKD EPI equation. This calculation has not been validated in all clinical situations. eGFR's persistently <60 mL/min signify possible Chronic Kidney Disease.    Anion gap 6 5 - 15  Troponin I     Status: None   Collection Time: 12/29/16 10:33 PM  Result Value Ref Range   Troponin I <0.03 <0.03 ng/mL  TSH     Status: None   Collection Time: 12/29/16 10:33 PM  Result Value Ref Range   TSH 2.753 0.350 - 4.500 uIU/mL    Comment: Performed by a 3rd Generation assay with a functional sensitivity of <=0.01 uIU/mL.   Dg Chest Port 1 View  Result Date: 12/30/2016 CLINICAL DATA:  Sudden onset of weakness and dizziness EXAM: PORTABLE CHEST 1 VIEW COMPARISON:  08/23/2016 FINDINGS: Post sternotomy changes. Streaky atelectasis or scarring at the bases. No pleural effusion. Stable borderline cardiomegaly with mild atherosclerosis. No pneumothorax. IMPRESSION: Streaky bibasilar atelectasis or scar.  No edema or infiltrate. Electronically Signed   By: Donavan Foil M.D.   On: 12/30/2016  01:12    Review of Systems  Constitutional: Negative for chills and fever.  HENT: Negative for sore throat and tinnitus.        Facial pressure  Eyes: Negative for blurred vision and redness.  Respiratory: Negative for cough and shortness of breath.   Cardiovascular: Negative for chest pain, palpitations, orthopnea and PND.  Gastrointestinal: Negative for abdominal pain, diarrhea, nausea and  vomiting.  Genitourinary: Negative for dysuria, frequency and urgency.  Musculoskeletal: Negative for joint pain and myalgias.  Skin: Negative for rash.       No lesions  Neurological: Positive for dizziness. Negative for speech change, focal weakness and weakness.  Endo/Heme/Allergies: Does not bruise/bleed easily.       No temperature intolerance  Psychiatric/Behavioral: Negative for depression and suicidal ideas.    Blood pressure (!) 149/68, pulse 63, temperature 98 F (36.7 C), temperature source Oral, resp. rate 16, height '5\' 9"'  (1.753 m), weight 97.5 kg (215 lb), SpO2 99 %. Physical Exam  Nursing note and vitals reviewed. Constitutional: He is oriented to person, place, and time. He appears well-developed and well-nourished. No distress.  HENT:  Head: Normocephalic and atraumatic.  Mouth/Throat: Oropharynx is clear and moist.  Eyes: Conjunctivae and EOM are normal. Pupils are equal, round, and reactive to light. No scleral icterus.  Neck: Normal range of motion. Neck supple. No JVD present. No tracheal deviation present. No thyromegaly present.  Cardiovascular: Normal rate, regular rhythm and normal heart sounds.  Exam reveals no gallop and no friction rub.   No murmur heard. Respiratory: Effort normal and breath sounds normal. No respiratory distress.  GI: Soft. Bowel sounds are normal. He exhibits no distension. There is no tenderness.  Genitourinary:  Genitourinary Comments: Deferred  Musculoskeletal: Normal range of motion. He exhibits no edema.  Lymphadenopathy:    He has no cervical adenopathy.  Neurological: He is alert and oriented to person, place, and time. No cranial nerve deficit.  Skin: Skin is warm and dry. No rash noted. No erythema.  Psychiatric: He has a normal mood and affect. His behavior is normal. Judgment and thought content normal.     Assessment/Plan This is a 71 year old male admitted for syncope. 1. Syncope: Rule out cardiac etiology. Monitor  telemetry. Obtain echocardiogram. Cardiology consult ordered. 2. Coronary artery disease: Stable; continue aspirin and Plavix. Nitroglycerin as needed. No episodes of chest pain with current symptoms. 3. Sinus congestion: Associated with tenderness; drainage and rapid pressure reduction and sinuses possible etiology of dizziness. WBC barely elevated warrant antibiotics. Unasyn then transition to Augmentin 4. BPH: Continue tamsulosin and finasteride 5. Atrial fibrillation: Currently normal sinus rhythm; continue metoprolol 6. DVT prophylaxis: Lovenox 7. GI prophylaxis: None The patient is a full code. Time spent on admission orders and patient care approximately 45 minutes  Harrie Foreman, MD 12/30/2016, 6:20 AM

## 2016-12-30 NOTE — Progress Notes (Signed)
*  PRELIMINARY RESULTS* Echocardiogram 2D Echocardiogram has been performed.  Tynslee Bowlds S Yakov Bergen 12/30/2016, 10:14 AM

## 2016-12-30 NOTE — Progress Notes (Signed)
Pt arrived from ED alert and oriented. No SOB, no c/o pain. VSS. Skin and telemetry verified with Susa RaringLisa RN. No concerns offered at this time.

## 2016-12-30 NOTE — Care Management Obs Status (Signed)
MEDICARE OBSERVATION STATUS NOTIFICATION   Patient Details  Name: [name] MRN: [id number] Date of Birth: Jun 19, 1946   Medicare Observation Status Notification Given:  No (Discharge order in less than 24 hours)    Caren MacadamMichelle Dimitrios Balestrieri, RN 12/30/2016, 12:07 PM

## 2016-12-30 NOTE — Consult Note (Addendum)
Cardiology Consultation Note  Patient ID: [name], MRN: [id number], DOB/AGE: [birth year] 71 y.o. Admit date: 12/29/2016   Date of Consult: 12/30/2016 Primary Physician: Glori Luis, MD Primary Cardiologist: [name]  Chief Complaint: Dizzy, spinning, nausea Reason for Consult: lightheaded, dizzy Physician requesting consult: Eliane Decree   HPI: 71 y.o. 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  He was sitting resting comfortably in his recliner, sat up quickly, had acute onset of dizziness, nausea. Felt like the room was spinning. Reported having significant nasal drainage (not a new issue).  Wife helped him, checked his blood pressure reporting systolic of 200. He was very red "all over" per the family. Reports the dizziness was not orthostasis, felt different, this felt like the room was turning. Symptoms persisted and they called 911. Notes indicating he was mildly orthostatic with a drop in pressures of 30 points at pressures were not low, 150s with standing. Wife denies any loss of consciousness She is concerned about other issues such as chronic fatigue, chronic shortness of breath, trace ankle swelling. Daughter concerned about diet, conditioning  This morning, he is comfortable with no recurrent symptoms Telemetry reviewed showing no arrhythmia Cardiac enzymes negative  Echocardiogram unchanged, normal LV function    Past Medical History:  Diagnosis Date  . Arthritis   . CAD, multiple vessel 03/2105   LM 65%, mLAD 70%, 90% & 80%, o-pCx 75%, mCx 95% & d Cx 70%, OM1 60%, mRCA 90% with 100% dRCA. R-R collaterals  . Chronic distal aortic occlusion (HCC)    By report  . Chronic kidney disease   . COPD (chronic obstructive pulmonary disease) (HCC)   . Depression   . Enlarged prostate    . Erectile dysfunction   . Essential hypertension   . History of kidney stones    multiple lithotripsies  . Hyperlipidemia LDL goal <70   . Hypogonadism in male    Previously on testosterone injections, but has not yet restarted until further testing  . Muscle pain   . PAF (paroxysmal atrial fibrillation) (HCC) 03/2015   Noted per-cath & post-op CABG.  Dr. [name], cardiology  . Peripheral vascular disease (HCC)    Bilateral ABIs 0.8  . Renal stones 03/2016  . S/P CABG x 5 04/04/2015   LIMA to LAD, SVG to DIAG, SVG to OM, seqSVG -PDA- dCx  . SOB (shortness of breath)   . 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 Weakness    weakness bilateral legs, s/p vein harvesting for CABPG      Most Recent Cardiac Studies: Echocardiogram performed today showing normal LV function, no significant valve disease, unchanged from previous echo , reviewed personally by myself    Surgical History:  Past Surgical History:  Procedure Laterality Date  . BLEPHAROPLASTY    . CARDIAC CATHETERIZATION  1992  . CARDIAC CATHETERIZATION N/A 03/31/2015   Procedure: Left Heart Cath and Coronary Angiography;  Surgeon: Marykay Lex, MD;  Location: [facility] INVASIVE CV LAB;  Service: Cardiovascular;  Laterality: N/A;  . CHOLECYSTECTOMY  2000   also had revision of hernia repair  . CORONARY ARTERY BYPASS GRAFT N/A 04/04/2015   Procedure: CORONARY ARTERY BYPASS GRAFTING (CABG);  Surgeon: [name], MD;  Location: [facility] OR;  Service: Open Heart Surgery;  Laterality: N/A;  . CYSTECTOMY  face and scrotum  . CYSTOSCOPY WITH INSERTION OF UROLIFT N/A 11/12/2016   Procedure: CYSTOSCOPY WITH INSERTION OF UROLIFT x4;  Surgeon: Jethro Bolusannenbaum, Sigmund, MD;  Location: [facility] ORS;  Service: Urology;  Laterality: N/A;  . CYSTOSCOPY WITH RETROGRADE PYELOGRAM, URETEROSCOPY AND STENT PLACEMENT Right 06/26/2016   Procedure: CYSTOSCOPY WITH RIGHT RETROGRADE PYELOGRAM, RIGHT URETEROSCOPY , HOLMIUM  LASER, BASKET EXTRACTION AND STENT PLACEMENT;  Surgeon: Jethro BolusSigmund Tannenbaum, MD;  Location: [facility] ORS;  Service: Urology;  Laterality: Right;  . Eugenie BirksLexiscan Myoview  June 2010   [facility]mill due to inability to reach target heart rate) --the study was normal with no evidence of ischemia and normal LV function. EF 7  . LITHOTRIPSY    . TEE WITHOUT CARDIOVERSION N/A 04/04/2015   Procedure: TRANSESOPHAGEAL ECHOCARDIOGRAM (TEE);  Surgeon: Delight OvensEdward B Gerhardt, MD;  [facility];  Service: Open Heart Surgery;  Laterality: N/A;  . VASECTOMY  1975  . VENTRAL HERNIA REPAIR  1994   complicated by infection     Home Meds: Prior to Admission medications   Medication Sig Start Date End Date Taking? Authorizing Provider  acetaminophen (TYLENOL) 650 MG CR tablet Take 650 mg by mouth daily. Reported on 01/03/2016   Yes [provider]  albuterol (PROVENTIL HFA;VENTOLIN HFA) 108 (90 Base) MCG/ACT inhaler Inhale 2 puffs into the lungs every 4 (four) hours as needed for wheezing or shortness of breath. 10/23/16  Yes Merwyn KatosSimonds, David B, MD  atorvastatin (LIPITOR) 40 MG tablet Take 1 tablet (40 mg total) by mouth daily. Patient taking differently: Take 40 mg by mouth daily at 6 PM.  01/25/16  Yes Shelia MediaWalker, Jennifer A, MD  clopidogrel (PLAVIX) 75 MG tablet Take 1 tablet (75 mg total) by mouth daily. 08/03/16  Yes Glori LuisSonnenberg, Eric G, MD  diphenhydramine-acetaminophen (TYLENOL PM) 25-500 MG TABS tablet Take 1 tablet by mouth at bedtime as needed (sleep).   Yes [provider]  finasteride (PROSCAR) 5 MG tablet TAKE 1 TABLET EVERY DAY 12/06/16  Yes Glori LuisSonnenberg, Eric G, MD  gabapentin (NEURONTIN) 100 MG capsule Take 1 capsule (100 mg total) by mouth 2 (two) times daily. Patient taking differently: Take 100 mg by mouth 2 (two) times daily. In the morning and midday 09/17/16  Yes Antoine PrimasSmith, Zachary M, DO  gabapentin (NEURONTIN) 400 MG capsule Take 1 capsule (400 mg total) by mouth every  evening. Patient taking differently: Take 400 mg by mouth at bedtime.  10/03/16  Yes Judi SaaSmith, Zachary M, DO  metoprolol tartrate (LOPRESSOR) 25 MG tablet Take 0.5 tablets (12.5 mg total) by mouth 2 (two) times daily. Patient taking differently: Take 12.5 mg by mouth daily.  08/03/16  Yes Glori LuisSonnenberg, Eric G, MD  NITROSTAT 0.4 MG SL tablet Place 0.4 mg under the tongue every 5 (five) minutes as needed for chest pain. Reported on 01/03/2016 03/28/15  Yes [provider]  Omega-3 Fatty Acids (FISH OIL) 1000 MG CAPS Take 1,000 mg by mouth 3 (three) times daily.   Yes [provider]  tamsulosin (FLOMAX) 0.4 MG CAPS capsule Take 0.4 mg by mouth daily.  05/02/16  Yes [provider]  testosterone cypionate (DEPOTESTOSTERONE CYPIONATE) 200 MG/ML injection Inject 100 mg into the muscle every 14 (fourteen) days.  10/24/16  Yes [provider]  traMADol-acetaminophen (ULTRACET) 37.5-325 MG tablet Take 1 tablet by mouth every 6 (six) hours as needed for moderate pain. 11/12/16  Yes Jethro Bolusannenbaum, Sigmund, MD  amoxicillin-clavulanate (AUGMENTIN) 875-125 MG tablet Take 1 tablet by mouth every 12 (  twelve) hours. 12/30/16 01/02/17  Auburn Bilberry, MD  fluticasone (FLONASE) 50 MCG/ACT nasal spray Place 2 sprays into both nostrils daily. 12/30/16   Auburn Bilberry, MD  trimethoprim (TRIMPEX) 100 MG tablet Take 1 tablet (100 mg total) by mouth 1 day or 1 dose. 11/12/16   Jethro Bolus, MD    Inpatient Medications:  . acetaminophen  650 mg Oral Daily  . amoxicillin-clavulanate  1 tablet Oral Q12H  . aspirin EC  81 mg Oral Daily  . atorvastatin  40 mg Oral q1800  . clopidogrel  75 mg Oral Daily  . docusate sodium  100 mg Oral BID  . enoxaparin (LOVENOX) injection  40 mg Subcutaneous Q24H  . finasteride  5 mg Oral Daily  . fluticasone  2 spray Each Nare Daily  . gabapentin  100 mg Oral BID  . gabapentin  400 mg Oral QHS  . metoprolol tartrate  12.5 mg Oral Daily  . omega-3 acid ethyl  esters  1 g Oral BID  . tamsulosin  0.4 mg Oral Daily     Allergies: No Known Allergies  Social History   Social History  . Marital status: Married    Spouse name: N/A  . Number of children: N/A  . Years of education: N/A   Occupational History  . Not on file.   Social History Main Topics  . Smoking status: Former Smoker    Packs/day: 2.00    Years: 40.00    Types: Cigarettes    Quit date: 10/17/1992  . Smokeless tobacco: Never Used     Comment: quit 1994  . Alcohol use 0.0 oz/week     Comment: seldom  . Drug use: No  . Sexual activity: No   Other Topics Concern  . Not on file   Social History Narrative   Lives in [place] with wife. No pets   Two children.      Former smoker. Quit in 1994. Prior to quitting was unable to walk without exertional dyspnea.      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      Hobbies - reading, drives Corvette, chuch     Family History  Problem Relation Age of Onset  . Coronary artery disease Father   . Heart disease Father   . Stroke Mother   . Hearing loss Mother   . Stroke Sister   . Hearing loss Maternal Grandmother   . Prostate cancer Neg Hx   . Kidney disease Neg Hx   . Other Neg Hx        hypogonadism     Review of Systems: Review of Systems  Constitutional: Positive for malaise/fatigue.  Respiratory: Negative.   Cardiovascular: Negative.   Gastrointestinal: Positive for nausea.  Musculoskeletal: Negative.   Neurological: Positive for dizziness.       Spinning  Psychiatric/Behavioral: Negative.   All other systems reviewed and are negative.   Labs: CBC  Recent Labs  12/29/16 2233  WBC 11.2*  HGB 14.1  HCT 41.5  MCV 95.0  PLT 194   Basic Metabolic Panel  Recent Labs  12/29/16 2233  NA 139  K 3.9  CL 108  CO2 25  GLUCOSE 172*  BUN 22*  CREATININE 1.20  CALCIUM 8.6*   Liver Function Tests  Recent Labs   12/29/16 2233  AST 21  ALT 19  ALKPHOS 55  BILITOT 0.5  PROT 6.3*  ALBUMIN 3.5  No results for input(s): LIPASE, AMYLASE in the last 72 hours. Cardiac Enzymes  Recent Labs  12/29/16 2233  TROPONINI <0.03   BNP Invalid input(s): POCBNP D-Dimer No results for input(s): DDIMER in the last 72 hours. Hemoglobin A1C No results for input(s): HGBA1C in the last 72 hours. Fasting Lipid Panel No results for input(s): CHOL, HDL, LDLCALC, TRIG, CHOLHDL, LDLDIRECT in the last 72 hours. Thyroid Function Tests  Recent Labs  12/29/16 2233  TSH 2.753    Radiology/Studies:  Dg Chest Port 1 View  Result Date: 12/30/2016 CLINICAL DATA:  Sudden onset of weakness and dizziness EXAM: PORTABLE CHEST 1 VIEW COMPARISON:  08/23/2016 FINDINGS: Post sternotomy changes. Streaky atelectasis or scarring at the bases. No pleural effusion. Stable borderline cardiomegaly with mild atherosclerosis. No pneumothorax. IMPRESSION: Streaky bibasilar atelectasis or scar.  No edema or infiltrate. Electronically Signed   By: Jasmine Pang M.D.   On: 12/30/2016 01:12    EKG: Personally reviewed by myself showing normal sinus rhythm no significant ST or T-wave changes  EKG lab work, chest x-ray, echocardiogram reviewed independently by myself  Weights: Filed Weights   12/29/16 2231 12/30/16 0328  Weight: 215 lb (97.5 kg) 218 lb 4.8 oz (99 kg)     Physical Exam: Blood pressure (!) 149/68, pulse 63, temperature 98 F (36.7 C), temperature source Oral, resp. rate 18, height 5\' 9"  (1.753 m), weight 218 lb 4.8 oz (99 kg), SpO2 95 %. Body mass index is 32.24 kg/m. GEN: Well nourished, well developed, in no acute distress.  HEENT: Grossly normal.  Neck: Supple, no JVD, carotid bruits, or masses. Cardiac: RRR, no murmurs, rubs, or gallops. No clubbing, cyanosis, edema.  Radials/DP/PT 2+ and equal bilaterally.  Respiratory:  Respirations regular and unlabored, clear to auscultation bilaterally. GI: Soft,  nontender, nondistended, BS + x 4. MS: no deformity or atrophy. Skin: warm and dry, no rash. Neuro:  Strength and sensation are intact. Psych: AAOx3.  Normal affect.    Assessment and Plan:   --Spinning/ nausea Concern for vertigo given symptoms presented after he changed position in his recliner,  Occurred during sitting position,  Described as room spinning associated with nausea High blood pressure likely in response to severe symptoms He reported it was different than normal lightheadedness from orthostasis cardiac enzymes negative, one more pending this morning. EKG unchanged, echo unchanged If follow-up troponin today is normal, no further cardiac workup needed Unable to exclude TIA or arrhythmia Could consider CT scan head, carotid For recurrent symptoms potentially could repeat event monitor  ----CAD, CABG EKG, echo unchanged Repeat troponin this morning pending. If this is normal, no further cardiac workup needed. Would follow up with Dr. [name] in [place].  ----COPD Likely contributing to his chronic shortness of breath On inhalers  ----Fatigue Long discussion with him, likely from general deconditioning. Needs exercise program on a regular basis Discussed this patient and family at the bedside     Total encounter time more than 80 minutes  Greater than 50% was spent in counseling and coordination of care with the patient   Signed, Dossie Arbour, MD, Ph.D [facility] 12/30/2016

## 2016-12-30 NOTE — Telephone Encounter (Signed)
Wife wanted Dr Herbie BaltimoreHarding to know that Mr [name] was [facility] w/ a CVA, but is doing better and may be d/c'd today.   Theodore DemarkBarrett, Ambrielle Kington, Cordelia Poche-C 12/30/2016 12:16 PM Beeper [phone]

## 2016-12-30 NOTE — ED Notes (Signed)
Pt. States having nasal drainage throughout day.

## 2016-12-30 NOTE — Progress Notes (Signed)
Pharmacy Antibiotic Note  [name] is a 71 y.o. male admitted on 12/29/2016 with sinusitis.  Pharmacy has been consulted for Unasyn dosing.  Plan: Unasyn 3 grams q 6 hours ordered.  Height: 5\' 9"  (175.3 cm) Weight: 218 lb 4.8 oz (99 kg) IBW/kg (Calculated) : 70.7  Temp (24hrs), Avg:97.8 F (36.6 C), Min:97.6 F (36.4 C), Max:98 F (36.7 C)   Recent Labs Lab 12/29/16 2233  WBC 11.2*  CREATININE 1.20    Estimated Creatinine Clearance: 64.5 mL/min (by C-G formula based on SCr of 1.2 mg/dL).    No Known Allergies  Antimicrobials this admission: Unasyn 6/17 >>    >>   Dose adjustments this admission:   Microbiology results: No micro   Thank you for allowing pharmacy to be a part of this patient'[name] care.  [name] 12/30/2016 6:48 AM

## 2016-12-30 NOTE — ED Notes (Signed)
Pt transport to 253 

## 2016-12-31 ENCOUNTER — Telehealth: Payer: Self-pay | Admitting: *Deleted

## 2016-12-31 LAB — HEMOGLOBIN A1C
Hgb A1c MFr Bld: 6.2 % — ABNORMAL HIGH (ref 4.8–5.6)
Mean Plasma Glucose: 131 mg/dL

## 2016-12-31 NOTE — Discharge Summary (Signed)
[facility], [place] y.o., DOB [birth year], MRN [id number]. Admission date: 12/29/2016 Discharge Date 12/31/2016 Primary MD Glori Luis, MD Admitting Physician Arnaldo Natal, MD  Admission Diagnosis  Generalized weakness [R53.1] Near syncope [R55]  Discharge Diagnosis   Active Problems: Near Syncope Coronary artery disease Possible sinusitis BPH Atrial fibrillation Coronary artery disease Chronic kidney disease stage unknown     Hospital Course patient is a 71 year old with history of coronary artery disease, CABG, COPD, essential hypertension, postoperative atrial fibrillation, hypothyroidism who was resting comfortably in his recliner, sat up quickly and had acute onset of dizziness nausea felt like the room was spinning. According to his wife patient became cyanotic. Patient was noted to have orthostatic hypotension in the emergency room and admitted for further evaluation. Patient was seen in consultation by cardiology and underwent a CT scan of the head which showed no stroke. Carotid Doppler showed plaque. He had a echocardiogram which showed some diastolic dysfunction. Patient was recommended to use TED hose for his orthostatic hypotension if it continues to be a problem will need medications. Currently is doing much better and is stable for discharge.            Consults  cardiology  Significant Tests:  See full reports for all details     Ct Head Wo Contrast  Result Date: 12/30/2016 CLINICAL DATA:  CVA.  Dizziness hypertension EXAM: CT HEAD WITHOUT CONTRAST TECHNIQUE: Contiguous axial images were obtained from the base of the skull through the vertex without intravenous contrast. COMPARISON:  12/17/2009 FINDINGS: Brain: Mild atrophy. Negative for acute infarct. Negative for acute hemorrhage or mass lesion. Vascular: Atherosclerotic calcification. Negative for hyperdense vessel Skull: Negative Sinuses/Orbits:  Negative Other: None IMPRESSION: No acute intracranial abnormality.  Generalized atrophy. Electronically Signed   By: Marlan Palau M.D.   On: 12/30/2016 14:41   US Carotid Bilateral  Result Date: 12/31/2016 CLINICAL DATA:  CVA. EXAM: BILATERAL CAROTID DUPLEX ULTRASOUND TECHNIQUE: Wallace Cullens scale imaging, color Doppler and duplex ultrasound were performed of bilateral carotid and vertebral arteries in the neck. COMPARISON:  CT 06/04/ 2011. FINDINGS: Criteria: Quantification of carotid stenosis is based on velocity parameters that correlate the residual internal carotid diameter with NASCET-based stenosis levels, using the diameter of the distal internal carotid lumen as the denominator for stenosis measurement. The following velocity measurements were obtained: RIGHT ICA:  66/15 Cm/sec CCA:  89/12 cm/sec SYSTOLIC ICA/CCA RATIO:  0.7 DIASTOLIC ICA/CCA RATIO:  1.2 ECA:  70 cm/sec LEFT ICA:  87/11 cm/sec CCA:  86/13 cm/sec SYSTOLIC ICA/CCA RATIO:  1.0 DIASTOLIC ICA/CCA RATIO:  0.8 ECA:104 cm/sec RIGHT CAROTID ARTERY: Mild scattered plaque right common carotid, carotid bifurcation, proximal ICA. No flow limiting stenosis. RIGHT VERTEBRAL ARTERY:  Patent with antegrade flow. LEFT CAROTID ARTERY: Mild scattered plaque left common carotid, carotid bifurcation, proximal ICA. No flow limiting stenosis. LEFT VERTEBRAL ARTERY:  Patent with antegrade flow. IMPRESSION: 1. Mild scattered plaque both common carotids, carotid bifurcations, proximal ICAs. No flow limiting stenosis. Degree of stenosis less than 50% bilaterally. 2.  Vertebrals are patent with antegrade flow. Electronically Signed   By: Maisie Fus  Register   On: 12/31/2016 06:15   [place] Limited Joint Space Structures Low Right  Result Date: 12/31/2016 Procedure: Real-time Ultrasound Guided Injection of right greater trochanteric bursitis secondary to patient's body habitus Device: GE Logiq Q7 Ultrasound guided injection is preferred based studies that show increased  duration, increased effect, greater accuracy, decreased procedural pain, increased response  rate, and decreased cost with ultrasound guided versus blind injection. Verbal informed consent obtained. Time-out conducted. Noted no overlying erythema, induration, or other signs of local infection. Skin prepped in a sterile fashion. Local anesthesia: Topical Ethyl chloride. With sterile technique and under real time ultrasound guidance:  Greater trochanteric area was visualized and patient's bursa was noted. A 22-gauge 3 inch needle was inserted and 4 cc of 0.5% Marcaine and 1 cc of Kenalog 40 mg/dL was injected. Pictures taken Completed without difficulty Pain immediately resolved suggesting accurate placement of the medication. Advised to call if fevers/chills, erythema, induration, drainage, or persistent bleeding. Images permanently stored and available for review in the ultrasound unit. Impression: Technically successful ultrasound guided injection.   Procedure: Real-time Ultrasound Guided Injection of left  greater trochanteric bursitis secondary to patient's body habitus Device: GE Logiq Q7 Ultrasound guided injection is preferred based studies that show increased duration, increased effect, greater accuracy, decreased procedural pain, increased response rate, and decreased cost with ultrasound guided versus blind injection. Verbal informed consent obtained. Time-out conducted. Noted no overlying erythema, induration, or other signs of local infection. Skin prepped in a sterile fashion. Local anesthesia: Topical Ethyl chloride. With sterile technique and under real time ultrasound guidance:  Greater trochanteric area was visualized and patient's bursa was noted. A 22-gauge 3 inch needle was inserted and 4 cc of 0.5% Marcaine and 1 cc of Kenalog 40 mg/dL was injected. Pictures taken Completed without difficulty Pain immediately resolved suggesting accurate placement of the medication. Advised to call if  fevers/chills, erythema, induration, drainage, or persistent bleeding. Images permanently stored and available for review in the ultrasound unit. Impression: Technically successful ultrasound guided injection.  Dg Chest Port 1 View  Result Date: 12/30/2016 CLINICAL DATA:  Sudden onset of weakness and dizziness EXAM: PORTABLE CHEST 1 VIEW COMPARISON:  08/23/2016 FINDINGS: Post sternotomy changes. Streaky atelectasis or scarring at the bases. No pleural effusion. Stable borderline cardiomegaly with mild atherosclerosis. No pneumothorax. IMPRESSION: Streaky bibasilar atelectasis or scar.  No edema or infiltrate. Electronically Signed   By: Jasmine PangKim  Fujinaga M.D.   On: 12/30/2016 01:12       Today   Subjective:   [name]  feeling much better much to go home  Objective:   Blood pressure (!) 143/62, pulse 75, temperature 98 F (36.7 C), temperature source Oral, resp. rate 18, height 5\' 9"  (1.753 m), weight 218 lb 4.8 oz (99 kg), SpO2 95 %.  .  Intake/Output Summary (Last 24 hours) at 12/31/16 1212 Last data filed at 12/30/16 1330  Gross per 24 hour  Intake              480 ml  Output                0 ml  Net              480 ml    Exam VITAL SIGNS: Blood pressure (!) 143/62, pulse 75, temperature 98 F (36.7 C), temperature source Oral, resp. rate 18, height 5\' 9"  (1.753 m), weight 218 lb 4.8 oz (99 kg), SpO2 95 %.  GENERAL:  71 y.o.-year-old patient lying in the bed with no acute distress.  EYES: Pupils equal, round, reactive to light and accommodation. No scleral icterus. Extraocular muscles intact.  HEENT: Head atraumatic, normocephalic. Oropharynx and nasopharynx clear.  NECK:  Supple, no jugular venous distention. No thyroid enlargement, no tenderness.  LUNGS: Normal breath sounds bilaterally, no wheezing, rales,rhonchi or crepitation. No use of accessory muscles  of respiration.  CARDIOVASCULAR: S1, S2 normal. No murmurs, rubs, or gallops.  ABDOMEN: Soft, nontender, nondistended.  Bowel sounds present. No organomegaly or mass.  EXTREMITIES: No pedal edema, cyanosis, or clubbing.  NEUROLOGIC: Cranial nerves II through XII are intact. Muscle strength 5/5 in all extremities. Sensation intact. Gait not checked.  PSYCHIATRIC: The patient is alert and oriented x 3.  SKIN: No obvious rash, lesion, or ulcer.   Data Review     CBC w Diff: Lab Results  Component Value Date   WBC 11.2 (H) 12/29/2016   HGB 14.1 12/29/2016   HGB 13.9 03/02/2015   HCT 41.5 12/29/2016   HCT 41.6 03/02/2015   PLT 194 12/29/2016   PLT 158 03/02/2015   LYMPHOPCT 38 11/18/2015   MONOPCT 16 11/18/2015   EOSPCT 3 11/18/2015   BASOPCT 1 11/18/2015   CMP: Lab Results  Component Value Date   NA 139 12/29/2016   K 3.9 12/29/2016   CL 108 12/29/2016   CO2 25 12/29/2016   BUN 22 (H) 12/29/2016   CREATININE 1.20 12/29/2016   CREATININE 1.21 (H) 10/30/2016   PROT 6.3 (L) 12/29/2016   ALBUMIN 3.5 12/29/2016   BILITOT 0.5 12/29/2016   ALKPHOS 55 12/29/2016   AST 21 12/29/2016   ALT 19 12/29/2016  .  Micro Results No results found for this or any previous visit (from the past 240 hour(s)).   Code Status History    Date Active Date Inactive Code Status Order ID Comments User Context   12/30/2016  3:27 AM 12/30/2016 10:42 AM Full Code [id number]  Arnaldo Natal, MD ED   04/04/2015  2:48 PM 04/11/2015  6:18 PM Full Code [id number]  Harriet Pho, PA-C Inpatient   03/31/2015  9:27 AM 04/04/2015  2:48 PM Full Code [id number]  Marykay Lex, MD Inpatient    Advance Directive Documentation     Most Recent Value  Type of Advance Directive  Healthcare Power of Attorney, Living will  Pre-existing out of facility DNR order (yellow form or pink MOST form)  -  "MOST" Form in Place?  -          Follow-up Information    Glori Luis, MD Follow up in 1 week(s).   Specialty:  Family Medicine Contact information: [address] [place] [address] [phone]         primary cardioligst Follow up in 1 week(s).           Discharge Medications   Allergies as of 12/30/2016   No Known Allergies     Medication List    TAKE these medications   acetaminophen 650 MG CR tablet Commonly known as:  TYLENOL Take 650 mg by mouth daily. Reported on 01/03/2016   albuterol 108 (90 Base) MCG/ACT inhaler Commonly known as:  PROVENTIL HFA;VENTOLIN HFA Inhale 2 puffs into the lungs every 4 (four) hours as needed for wheezing or shortness of breath.   amoxicillin-clavulanate 875-125 MG tablet Commonly known as:  AUGMENTIN Take 1 tablet by mouth every 12 (twelve) hours.   atorvastatin 40 MG tablet Commonly known as:  LIPITOR Take 1 tablet (40 mg total) by mouth daily. What changed:  when to take this   clopidogrel 75 MG tablet Commonly known as:  PLAVIX Take 1 tablet (75 mg total) by mouth daily.   diphenhydramine-acetaminophen 25-500 MG Tabs tablet Commonly known as:  TYLENOL PM Take 1 tablet by mouth at bedtime as needed (sleep).   finasteride 5  MG tablet Commonly known as:  PROSCAR TAKE 1 TABLET EVERY DAY   Fish Oil 1000 MG Caps Take 1,000 mg by mouth 3 (three) times daily.   fluticasone 50 MCG/ACT nasal spray Commonly known as:  FLONASE Place 2 sprays into both nostrils daily.   gabapentin 100 MG capsule Commonly known as:  NEURONTIN Take 1 capsule (100 mg total) by mouth 2 (two) times daily. What changed:  additional instructions   gabapentin 400 MG capsule Commonly known as:  NEURONTIN Take 1 capsule (400 mg total) by mouth every evening. What changed:  when to take this   metoprolol tartrate 25 MG tablet Commonly known as:  LOPRESSOR Take 0.5 tablets (12.5 mg total) by mouth 2 (two) times daily. What changed:  when to take this   NITROSTAT 0.4 MG SL tablet Generic drug:  nitroGLYCERIN Place 0.4 mg under the tongue every 5 (five) minutes as needed for chest pain. Reported on 01/03/2016   tamsulosin 0.4 MG Caps capsule Commonly  known as:  FLOMAX Take 0.4 mg by mouth daily.   testosterone cypionate 200 MG/ML injection Commonly known as:  DEPOTESTOSTERONE CYPIONATE Inject 100 mg into the muscle every 14 (fourteen) days.   traMADol-acetaminophen 37.5-325 MG tablet Commonly known as:  ULTRACET Take 1 tablet by mouth every 6 (six) hours as needed for moderate pain.   trimethoprim 100 MG tablet Commonly known as:  TRIMPEX Take 1 tablet (100 mg total) by mouth 1 day or 1 dose.          Total Time in preparing paper work, data evaluation and todays exam - 35 minutes  Auburn Bilberry M.D on 12/31/2016 at 12:12 PM  [facility]   Office  [phone]

## 2016-12-31 NOTE — Telephone Encounter (Signed)
Thanks - will look for d/c summary. Marc Lemmaavid Jaquasia Doscher, MD

## 2016-12-31 NOTE — Telephone Encounter (Signed)
Transition Care Management Follow-up Telephone Call  How have you been since you were released from the hospital?  Patient that he has felt fine since leaving the hospital.  Patient stated on Saturday he went to stand up that he became very dizzy and his BP was very elevated when this happened the second time he called EMS.   Do you understand why you were in the hospital? Yes, near syncope with elevated BP.   Do you understand the discharge instrcutions? yes  Items Reviewed:  Medications reviewed: Yes, patient stated his Nitro Stat is expired needs new script.  Allergies reviewed: yes  Dietary changes reviewed: Yes  Referrals reviewed:yes   Functional Questionnaire:  Activities of Daily Living (ADLs):   He states they are independent in the following: Independent in all ADLs. States they require assistance with the following:  NO assistance needed at this time   Any transportation issues/concerns?:No   Any patient concerns? Yes, patient is concerned about carotid US results and the next steps in finding why he became so dizzy " that the room was actually spinning"   Confirmed importance and date/time of follow-up visits scheduled:Yes   Confirmed with patient if condition begins to worsen call PCP or go to the ER.  Patient was given the Call-a-Nurse line [phone]: Yes

## 2016-12-31 NOTE — Telephone Encounter (Signed)
Patient stated that he was discharged from [facility] on 12/30/16. Patient requested to have Dr. Birdie SonsSonnenberg review his [facility] of test[facility].  Pt contact [phone]  Pt has been scheduled

## 2017-01-01 ENCOUNTER — Other Ambulatory Visit: Payer: Self-pay | Admitting: Cardiology

## 2017-01-01 DIAGNOSIS — I200 Unstable angina: Secondary | ICD-10-CM

## 2017-01-02 ENCOUNTER — Ambulatory Visit (INDEPENDENT_AMBULATORY_CARE_PROVIDER_SITE_OTHER): Payer: Medicare Other | Admitting: Family Medicine

## 2017-01-02 ENCOUNTER — Encounter: Payer: Self-pay | Admitting: Family Medicine

## 2017-01-02 DIAGNOSIS — I70211 Atherosclerosis of native arteries of extremities with intermittent claudication, right leg: Secondary | ICD-10-CM

## 2017-01-02 DIAGNOSIS — I951 Orthostatic hypotension: Secondary | ICD-10-CM | POA: Diagnosis not present

## 2017-01-02 DIAGNOSIS — I739 Peripheral vascular disease, unspecified: Secondary | ICD-10-CM | POA: Diagnosis not present

## 2017-01-02 DIAGNOSIS — H539 Unspecified visual disturbance: Secondary | ICD-10-CM | POA: Insufficient documentation

## 2017-01-02 NOTE — Assessment & Plan Note (Signed)
Patient reports intermittent vision changes. Vision checked today and there is a slight difference between the left eye and the right eye. Encouraged him to schedule a follow-up appointment with his optometrist.

## 2017-01-02 NOTE — Assessment & Plan Note (Signed)
Suspect symptoms related to orthostatic hypotension given orthostasis in the emergency room and here today.  potentially could be related to vertigo and BPPV the Dix-Hallpike negative today. his workup in the hospital was reassuring. We'll continue compression stockings. He'll follow up with cardiology as scheduled tomorrow. He may need to come off of his metoprolol versus start on medication to help with his orthostasis. He is given return precautions.

## 2017-01-02 NOTE — Patient Instructions (Signed)
Nice to see you. I believe you're symptoms are likely related to orthostatic hypotension. Potentially they could be related to vertigo as well. Please see your cardiologist tomorrow as scheduled to determine if you need any additional treatment or medication changes regarding your blood pressure. I would advise you to see your ophthalmologist as well. If you develop any persistent recurrent symptoms or any new symptoms such as chest pain or shortness of breath please seek medical attention immediately.

## 2017-01-02 NOTE — Progress Notes (Signed)
  Marc AlarEric Sonnenberg, MD Phone: [phone]  [name] is a 71 y.o. male who presents today for hospital follow-up.  Patient was hospitalized from 12/29/16-12/31/16 for presyncope and lightheadedness. Patient was sitting in his recliner and then sat up and noted the room started to spin and he felt nauseous and clammy. His wife reports he looked white. He noted no chest pain, shortness breath, or palpitations. She checked his blood pressure and they were 200s over 130s initially and then came down to 187/98. Blood sugar was 155. He was found to be orthostatic in the emergency room. He was evaluated in the hospital by cardiology and the hospitalist. It was recommended that he use compression stockings for orthostatic hypotension and follow-up with cardiology. He had an echo that was reassuring. Carotid Doppler showed a degree of plaque. He had a CT scan of his head which was unremarkable for stroke. He noted minimal lightheadedness last night though has otherwise had no issues with this. Does note mild intermittent vision changes over a number of months that are worse certain times of the day. He has seen his eye doctor about 4-5 months ago. He has an appointment with cardiology tomorrow. Patient also reports he never scheduled follow-up with vascular surgery. Discharge summary reviewed and medications reviewed.  PMH: Former smoker   ROS see history of present illness  Objective  Physical Exam Vitals:   01/02/17 1335  BP: 124/80  Pulse: 84  Temp: 98.1 F (36.7 C)   Laying blood pressure 136/70 pulse 84 Sitting blood pressure 140/80 pulse 88  Standing blood pressure 116/62 pulse 92  BP Readings from Last 3 Encounters:  01/02/17 124/80  12/30/16 (!) 143/62  12/27/16 110/68   Wt Readings from Last 3 Encounters:  01/02/17 219 lb (99.3 kg)  12/30/16 218 lb 4.8 oz (99 kg)  12/27/16 221 lb (100.2 kg)    Physical Exam  Constitutional: No distress.  HENT:  Head: Normocephalic and  atraumatic.  Normal TMs bilaterally  Eyes: Conjunctivae are normal. Pupils are equal, round, and reactive to light.  Cardiovascular: Normal rate, regular rhythm and normal heart sounds.   Pulmonary/Chest: Effort normal and breath sounds normal.  Musculoskeletal: He exhibits no edema.  Neurological: He is alert. Gait normal.  Negative Dix-Hallpike  Skin: Skin is warm and dry. He is not diaphoretic.     Assessment/Plan: Please see individual problem list.  Orthostatic hypotension Suspect symptoms related to orthostatic hypotension given orthostasis in the emergency room and here today.  potentially could be related to vertigo and BPPV the Dix-Hallpike negative today. his workup in the hospital was reassuring. We'll continue compression stockings. He'll follow up with cardiology as scheduled tomorrow. He may need to come off of his metoprolol versus start on medication to help with his orthostasis. He is given return precautions.  PAD (peripheral artery disease) Patient with PAD. Also has stenosis of aorta. He had been referred to see vascular surgery locally though he prefers to stay in [place]. Discussed having him ask his cardiologist tomorrow when he sees them if there is somebody in his office or if we need to refer to vascular surgery in [place].  Vision changes Patient reports intermittent vision changes. Vision checked today and there is a slight difference between the left eye and the right eye. Encouraged him to schedule a follow-up appointment with his optometrist.   Marc AlarEric Sonnenberg, MD [facility] [place]

## 2017-01-02 NOTE — Assessment & Plan Note (Signed)
Patient with PAD. Also has stenosis of aorta. He had been referred to see vascular surgery locally though he prefers to stay in [place]. Discussed having him ask his cardiologist tomorrow when he sees them if there is somebody in his office or if we need to refer to vascular surgery in [place].

## 2017-01-02 NOTE — Assessment & Plan Note (Deleted)
Patient with history of atherosclerosis was lower extremities and peripheral artery disease

## 2017-01-03 ENCOUNTER — Encounter: Payer: Self-pay | Admitting: Cardiology

## 2017-01-03 ENCOUNTER — Ambulatory Visit (INDEPENDENT_AMBULATORY_CARE_PROVIDER_SITE_OTHER): Payer: Medicare Other | Admitting: Cardiology

## 2017-01-03 VITALS — BP 150/82 | HR 72 | Ht 69.0 in | Wt 217.0 lb

## 2017-01-03 DIAGNOSIS — R55 Syncope and collapse: Secondary | ICD-10-CM

## 2017-01-03 DIAGNOSIS — I480 Paroxysmal atrial fibrillation: Secondary | ICD-10-CM | POA: Diagnosis not present

## 2017-01-03 DIAGNOSIS — I25119 Atherosclerotic heart disease of native coronary artery with unspecified angina pectoris: Secondary | ICD-10-CM

## 2017-01-03 DIAGNOSIS — E785 Hyperlipidemia, unspecified: Secondary | ICD-10-CM

## 2017-01-03 DIAGNOSIS — I779 Disorder of arteries and arterioles, unspecified: Secondary | ICD-10-CM | POA: Diagnosis not present

## 2017-01-03 DIAGNOSIS — I739 Peripheral vascular disease, unspecified: Secondary | ICD-10-CM | POA: Diagnosis not present

## 2017-01-03 DIAGNOSIS — I2510 Atherosclerotic heart disease of native coronary artery without angina pectoris: Secondary | ICD-10-CM | POA: Diagnosis not present

## 2017-01-03 DIAGNOSIS — I209 Angina pectoris, unspecified: Secondary | ICD-10-CM

## 2017-01-03 NOTE — Patient Instructions (Addendum)
No change with medications    MAY TAKE BLOOD PRESSURE TWICE A WEEK TO JUST KEEP A CHECK ON READINGS.    Your physician wants you to follow-up in 6 months with DR HARDING. You will receive a reminder letter in the mail two months in advance. If you don't receive a letter, please call our office to schedule the follow-up appointment.

## 2017-01-03 NOTE — Progress Notes (Signed)
PCP: Glori LuisSonnenberg, Eric G, MD  Clinic Note: Chief Complaint  Patient presents with  . Follow-up    pt c/o pre syncope  . Coronary Artery Disease  . PAD    Occluded distal aorta    HPI: [name] is a 71 y.o. male with a PMH below who presents today for hospital f/u - near syncope. He has a history of CAD noticed for progressive angina and referred for CABG for multivessel disease. He also has long-standing known occlusion of the distal aorta. He had atrial fibrillation in the setting of his progressive angina and pre-/peri-CABG timeframe. He has had some sternal wound nonunion that is being managed expectantly.  [name] was last seen on 10/23/2016  Recent Hospitalizations:   12/29/2016 admitted for generalized weakness - acute onset of dizziness and nausea. Apparently became cyanotic no evidence of stroke. Echo done was diastolic dysfunction. --> sitting up from recliner - nauseated, weak, diaphoretic.  BP > 200 mmHg --> was noted to be orthostatic in the emergency room;  according to Dr. Mariah MillingGollan, the symptoms seemed more like vertigo with dizziness.  Studies Personally Reviewed - (if available, images/films reviewed: From Epic Chart or Care Everywhere)  Transthoracic echo 12/30/2016: EF 60-65%. GR 1 DD. Mildly dilated ascending aorta. Otherwise essentially normal with no valve lesions.  Carotid Dopplers 12/30/2016: Patent vertebrals. Mild bilateral carotid plaque, <50% stenosis bilaterally   Aortoiliac Dopplers: Unable to visualize distal aorta -> blunted bilateral common iliac waveforms.  Lower extremity arterial Dopplers 11/29/2016: RABI 0.82, LABI 0.81.  Bilateral inflow disease noted in both femoral arteries. Less than 50% stenosis noted bilaterally.- no focal stenosis with three-vessel runoff bilaterally. Left TBI reduced.   Interval History: [name] returns today not really sure what exactly happened when he came in during that episode of his not had any further episodes,  but does note his blood pressures been a little bit higher since. He has not had any resting or exertional chest tightness or pressure. He has not had any claudication symptoms. We talked about referral to a vascular surgeon which his PCP is done for local surgeon in [place]. The interesting feature of his episodes of his blood pressure was really high when checked shortly after the episode, but this symptom really sounds like it was orthostatic. I suspect he may have been quite stressed. He is not had any further symptoms.  He denies any chest tightness pressure with rest or exertion. No PND, orthopnea or edema. He is not had any rapid irregular heartbeats or palpitations. No TIA or amaurosis fugax of this.  No melena, hematochezia, hematuria, or epstaxis. No claudication - more OA related hip pain.    ROS: A comprehensive was performed. Review of Systems  Constitutional: Negative for malaise/fatigue.  HENT: Negative for congestion and nosebleeds.   Respiratory: Negative for cough and shortness of breath.   Genitourinary: Positive for frequency (Constant frequent urination - despite Urolift.).  Musculoskeletal: Positive for joint pain (Hip pain). Negative for falls and myalgias.  Neurological: Positive for dizziness (See history of present illness). Negative for focal weakness and loss of consciousness.  Endo/Heme/Allergies: Negative for environmental allergies.  Psychiatric/Behavioral: Negative for memory loss. The patient is not nervous/anxious and does not have insomnia.   All other systems reviewed and are negative.   I have reviewed and (if needed) personally updated the patient's problem list, medications, allergies, past medical and surgical history, social and family history.   Past Medical History:  Diagnosis Date  . Arthritis   .  CAD, multiple vessel 03/2105   LM 65%, mLAD 70%, 90% & 80%, o-pCx 75%, mCx 95% & d Cx 70%, OM1 60%, mRCA 90% with 100% dRCA. R-R collaterals  .  Chronic distal aortic occlusion (HCC)    By report  . Chronic kidney disease   . COPD (chronic obstructive pulmonary disease) (HCC)   . Depression   . Enlarged prostate   . Erectile dysfunction   . Essential hypertension   . History of kidney stones    multiple lithotripsies  . Hyperlipidemia LDL goal <70   . Hypogonadism in male    Previously on testosterone injections, but has not yet restarted until further testing  . Muscle pain   . PAF (paroxysmal atrial fibrillation) (HCC) 03/2015   Noted per-cath & post-op CABG.  Dr. [name], cardiology  . Peripheral vascular disease (HCC)    Bilateral ABIs 0.8  . Renal stones 03/2016  . S/P CABG x 5 04/04/2015   LIMA to LAD, SVG to DIAG, SVG to OM, seqSVG -PDA- dCx  . SOB (shortness of breath)   . 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 Weakness    weakness bilateral legs, s/p vein harvesting for CABPG    Past Surgical History:  Procedure Laterality Date  . BLEPHAROPLASTY    . CARDIAC CATHETERIZATION  1992  . CARDIAC CATHETERIZATION N/A 03/31/2015   Procedure: Left Heart Cath and Coronary Angiography;  Surgeon: Marykay Lex, MD;  Location: [facility] INVASIVE CV LAB: LM 65%, mLAD 70%, 90% & 80%, o-pCx 75%, mCx 95% & d Cx 70%, OM1 60%, mRCA 90% with 100% dRCA. R-R collaterals --> CABG referral. Also noted to be in Afib.  . Carotid Dopplers  12/30/2016   Patent vertebrals. Mild bilateral carotid plaque, <50% stenosis bilaterally.  . CHOLECYSTECTOMY  2000   also had revision of hernia repair  . CORONARY ARTERY BYPASS GRAFT N/A 04/04/2015   Procedure: CORONARY ARTERY BYPASS GRAFTING (CABG);  Surgeon: [name], MD;  Location: [facility] OR;  Service: Open Heart Surgery;  Laterality: N/A;  . CYSTECTOMY     face and scrotum  . CYSTOSCOPY WITH INSERTION OF UROLIFT N/A 11/12/2016   Procedure: CYSTOSCOPY WITH INSERTION OF UROLIFT x4;  Surgeon: Jethro Bolus, MD;  Location: [facility] ORS;  Service: Urology;   Laterality: N/A;  . CYSTOSCOPY WITH RETROGRADE PYELOGRAM, URETEROSCOPY AND STENT PLACEMENT Right 06/26/2016   Procedure: CYSTOSCOPY WITH RIGHT RETROGRADE PYELOGRAM, RIGHT URETEROSCOPY , HOLMIUM LASER, BASKET EXTRACTION AND STENT PLACEMENT;  Surgeon: Jethro Bolus, MD;  Location: [facility] ORS;  Service: Urology;  Laterality: Right;  . Eugenie Birks Myoview  June 2010   converted to [facility] from treadmill due to inability to reach target heart rate) --the study was normal with no evidence of ischemia and normal LV function. EF 7  . LITHOTRIPSY    . TEE WITHOUT CARDIOVERSION N/A 04/04/2015   Procedure: TRANSESOPHAGEAL ECHOCARDIOGRAM (TEE);  Surgeon: [name], MD;  Location: [facility] OR;  Service: Open Heart Surgery;  Laterality: N/A;  . TRANSTHORACIC ECHOCARDIOGRAM  12/30/2016   EF 60-65%. GR 1 DD. Mildly dilated ascending aorta. Otherwise essentially normal with no valve lesions.  Marland Kitchen VASECTOMY  1975  . VENTRAL HERNIA REPAIR  1994   complicated by infection    Current Meds  Medication Sig  . acetaminophen (TYLENOL) 650 MG CR tablet Take 650 mg by mouth every 8 (eight) hours as needed for pain. Reported on 01/03/2016  . albuterol (PROVENTIL HFA;VENTOLIN HFA) 108 (  90 Base) MCG/ACT inhaler Inhale 2 puffs into the lungs every 4 (four) hours as needed for wheezing or shortness of breath.  Marland Kitchen atorvastatin (LIPITOR) 40 MG tablet Take 1 tablet (40 mg total) by mouth daily. (Patient taking differently: Take 40 mg by mouth daily at 6 PM. )  . clopidogrel (PLAVIX) 75 MG tablet Take 1 tablet (75 mg total) by mouth daily.  . diphenhydramine-acetaminophen (TYLENOL PM) 25-500 MG TABS tablet Take 1 tablet by mouth at bedtime as needed (sleep).  . finasteride (PROSCAR) 5 MG tablet TAKE 1 TABLET EVERY DAY  . fluticasone (FLONASE) 50 MCG/ACT nasal spray Place 2 sprays into both nostrils daily.  Marland Kitchen gabapentin (NEURONTIN) 100 MG capsule Take 1 capsule (100 mg total) by mouth 2 (two) times daily. (Patient taking  differently: Take 100 mg by mouth 2 (two) times daily. In the morning and midday)  . gabapentin (NEURONTIN) 400 MG capsule Take 1 capsule (400 mg total) by mouth every evening. (Patient taking differently: Take 400 mg by mouth at bedtime. )  . metoprolol tartrate (LOPRESSOR) 25 MG tablet Take 0.5 tablets (12.5 mg total) by mouth 2 (two) times daily. (Patient taking differently: Take 12.5 mg by mouth daily. )  . NITROSTAT 0.4 MG SL tablet PLACE 1 TABLET (0.4 MG TOTAL) UNDER THE TONGUE EVERY 5 (FIVE) MINUTES AS NEEDED FOR CHEST PAIN.  Marland Kitchen Omega-3 Fatty Acids (FISH OIL) 1000 MG CAPS Take 1,000 mg by mouth 3 (three) times daily.  . tamsulosin (FLOMAX) 0.4 MG CAPS capsule Take 0.4 mg by mouth daily.   Marland Kitchen testosterone cypionate (DEPOTESTOSTERONE CYPIONATE) 200 MG/ML injection Inject 100 mg into the muscle every 14 (fourteen) days.   . traMADol-acetaminophen (ULTRACET) 37.5-325 MG tablet Take 1 tablet by mouth every 6 (six) hours as needed for moderate pain.    No Known Allergies  Social History   Social History  . Marital status: Married    Spouse name: N/A  . Number of children: 2  . Years of education: 73   Social History Main Topics  . Smoking status: Former Smoker    Packs/day: 2.00    Years: 40.00    Types: Cigarettes    Quit date: 10/17/1992  . Smokeless tobacco: Never Used     Comment: quit 1994  . Alcohol use 0.6 oz/week    1 Standard drinks or equivalent per week     Comment: seldom - vodka or wine  . Drug use: No  . Sexual activity: No   Other Topics Concern  . None   Social History Narrative   Lives in [place] with wife. No pets   Two children.      Former smoker. Quit in 1994. Prior to quitting was unable to walk without exertional dyspnea.      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      Hobbies - reading, drives Corvette, chuch    family history includes Coronary artery disease in his  father; Hearing loss in his maternal grandmother and mother; Heart disease in his father; Stroke in his mother and sister.  Wt Readings from Last 3 Encounters:  01/03/17 217 lb (98.4 kg)  01/02/17 219 lb (99.3 kg)  12/30/16 218 lb 4.8 oz (99 kg)    PHYSICAL EXAM BP (!) 150/82 (BP Location: Right Arm, Patient Position: Sitting, Cuff Size: Normal)   Pulse 72   Ht 5\' 9"  (1.753 m)   Wt 217 lb (  98.4 kg)   BMI 32.05 kg/m  General appearance: alert, cooperative, appears stated age, no distress. Mildly obese (all truncal) HEENT: /AT, EOMI, MMM, anicteric sclera Neck: no adenopathy, no carotid bruit and no JVD Lungs: clear to auscultation bilaterally, normal percussion bilaterally and non-labored Heart: regular rate and rhythm, S1 &S2 normal, no murmur, click, rub or gallop; nondisplaced PMI Abdomen: soft, non-tender; bowel sounds normal; no masses,  no organomegaly; truncal obesity. HJR Extremities: extremities normal, atraumatic, no cyanosis, or edema  Pulses: 2+ and symmetric; - mildly diminished bilateral pedal pulses. Skin: mobility and turgor normal, no evidence of bleeding or bruising and no lesions noted - no significant bruising Neurologic: Mental status: Alert & oriented x 3, thought content appropriate; non-focal exam.  Pleasant mood & affect.   Adult ECG Report  Rate: 72 ;  Rhythm: normal sinus rhythm and short PR (106).  Otherwise normal axis, intervals & durations.;   Narrative Interpretation: otherwise normal  Other studies Reviewed: Additional studies/ records that were reviewed today include:  Recent Labs:  Lab Results  Component Value Date   CHOL 155 10/30/2016   HDL 36 (L) 10/30/2016   LDLCALC 63 10/30/2016   TRIG 282 (H) 10/30/2016   CHOLHDL 4.3 10/30/2016    ASSESSMENT / PLAN: Problem List Items Addressed This Visit    CAD, multiple vessel (Chronic)    Multivessel CAD status post CABG. Only on low-dose beta blocker as well as statin and Plavix. No anginal  symptoms. He does have fatigue which I think is probably more related to deconditioning as he is not very active. I think he gained some weight postoperatively.      Coronary artery disease involving native coronary artery with angina pectoris (HCC) (Chronic)   Relevant Orders   EKG 12-Lead (Completed)   Dyslipidemia, goal LDL below 70 (Chronic)    Cholesterol looks much better. HDL is still a little low, but better. Triglycerides are elevated. Continue to monitor for now would probably recheck in about 6 months. We talked about dietary adjustments and reasons for high triglycerides.      Near syncope    Strange episode. I'm not really sure what to think of it. I suspect that he was potentially dehydrated and orthostatic, but he was hypertensive when they checked him. Everything would suggest orthostasis, but according to Dr. Windell Hummingbird note, this symptom that he understood was more consistent with vertigo. He did note dizziness with change of position and still had a little bit of that. At this point, I'm not sure what additional evaluation we should do. I'm sending her to allow his blood pressures to be a little bit elevated with permissive hypertension for little while. He is taking Neurontin which could feel somewhat dizzy.  For now continue low-dose Lopressor, but no other hypertensive agents.      PAD (peripheral artery disease) (HCC) (Chronic)    He basically has occlusion of his distal aorta by previous report. Abdominal aortic Dopplers did not help, but the peripheral Doppler suggests poor inflow. He has been referred to vascular surgery in [place] which is reasonable. I gave her the option of seeing Dr. Kirke Corin as a vascular cardiologist in [place], but I think for his condition he probably would need to see a surgeon in case there is an acute exacerbation.  Thankfully, he is not currently having any claudication symptoms. I think the hip pain is probably not claudication.       Relevant Orders   EKG 12-Lead (Completed)  PAF (paroxysmal atrial fibrillation) (HCC) (Chronic)    As far as I can tell, no further exacerbations or recurrences. Simply continue beta blocker and Plavix.      Relevant Orders   EKG 12-Lead (Completed)      Current medicines are reviewed at length with the patient today. (+/- concerns) n/a The following changes have been made: n/a  Patient Instructions  No change with medications    MAY TAKE BLOOD PRESSURE TWICE A WEEK TO JUST KEEP A CHECK ON READINGS.    Your physician wants you to follow-up in 6 months with DR Gelena Klosinski. You will receive a reminder letter in the mail two months in advance. If you don't receive a letter, please call our office to schedule the follow-up appointment.    Studies Ordered:   Orders Placed This Encounter  Procedures  . EKG 12-Lead      Bryan Lemma, M.D., M.S. Interventional Cardiologist   Pager # [phone] Phone # [phone] [address] [name], [place] [address]

## 2017-01-05 ENCOUNTER — Encounter: Payer: Self-pay | Admitting: Cardiology

## 2017-01-05 NOTE — Assessment & Plan Note (Signed)
He basically has occlusion of his distal aorta by previous report. Abdominal aortic Dopplers did not help, but the peripheral Doppler suggests poor inflow. He has been referred to vascular surgery in [place] which is reasonable. I gave her the option of seeing Dr. Kirke CorinArida as a vascular cardiologist in [place], but I think for his condition he probably would need to see a surgeon in case there is an acute exacerbation.  Thankfully, he is not currently having any claudication symptoms. I think the hip pain is probably not claudication.

## 2017-01-05 NOTE — Assessment & Plan Note (Signed)
As far as I can tell, no further exacerbations or recurrences. Simply continue beta blocker and Plavix.

## 2017-01-05 NOTE — Assessment & Plan Note (Signed)
Strange episode. I'm not really sure what to think of it. I suspect that he was potentially dehydrated and orthostatic, but he was hypertensive when they checked him. Everything would suggest orthostasis, but according to Dr. Windell HummingbirdGollan's note, this symptom that he understood was more consistent with vertigo. He did note dizziness with change of position and still had a little bit of that. At this point, I'm not sure what additional evaluation we should do. I'm sending her to allow his blood pressures to be a little bit elevated with permissive hypertension for little while. He is taking Neurontin which could feel somewhat dizzy.  For now continue low-dose Lopressor, but no other hypertensive agents.

## 2017-01-05 NOTE — Assessment & Plan Note (Signed)
Multivessel CAD status post CABG. Only on low-dose beta blocker as well as statin and Plavix. No anginal symptoms. He does have fatigue which I think is probably more related to deconditioning as he is not very active. I think he gained some weight postoperatively.

## 2017-01-05 NOTE — Assessment & Plan Note (Signed)
Cholesterol looks much better. HDL is still a little low, but better. Triglycerides are elevated. Continue to monitor for now would probably recheck in about 6 months. We talked about dietary adjustments and reasons for high triglycerides.

## 2017-01-07 ENCOUNTER — Encounter: Payer: Self-pay | Admitting: Family Medicine

## 2017-01-09 DIAGNOSIS — E291 Testicular hypofunction: Secondary | ICD-10-CM | POA: Diagnosis not present

## 2017-01-22 DIAGNOSIS — E291 Testicular hypofunction: Secondary | ICD-10-CM | POA: Diagnosis not present

## 2017-01-28 ENCOUNTER — Ambulatory Visit: Payer: Medicare Other | Admitting: Family Medicine

## 2017-02-05 ENCOUNTER — Ambulatory Visit: Payer: Medicare Other | Admitting: Family Medicine

## 2017-02-05 DIAGNOSIS — E291 Testicular hypofunction: Secondary | ICD-10-CM | POA: Diagnosis not present

## 2017-02-06 ENCOUNTER — Telehealth: Payer: Self-pay | Admitting: Family Medicine

## 2017-02-06 NOTE — Telephone Encounter (Signed)
Patient already has referral

## 2017-02-06 NOTE — Telephone Encounter (Signed)
Pt is ready to go see the vascular and vein specialist. Referral needed please and thank you!  Call pt @ [phone]. Or wife [phone].

## 2017-02-11 ENCOUNTER — Ambulatory Visit: Payer: Medicare Other | Admitting: Family Medicine

## 2017-02-21 DIAGNOSIS — E291 Testicular hypofunction: Secondary | ICD-10-CM | POA: Diagnosis not present

## 2017-02-27 ENCOUNTER — Other Ambulatory Visit: Payer: Self-pay | Admitting: Family Medicine

## 2017-02-27 DIAGNOSIS — N400 Enlarged prostate without lower urinary tract symptoms: Secondary | ICD-10-CM

## 2017-02-28 DIAGNOSIS — N401 Enlarged prostate with lower urinary tract symptoms: Secondary | ICD-10-CM | POA: Diagnosis not present

## 2017-02-28 DIAGNOSIS — R351 Nocturia: Secondary | ICD-10-CM | POA: Diagnosis not present

## 2017-02-28 DIAGNOSIS — E291 Testicular hypofunction: Secondary | ICD-10-CM | POA: Diagnosis not present

## 2017-03-07 DIAGNOSIS — E291 Testicular hypofunction: Secondary | ICD-10-CM | POA: Diagnosis not present

## 2017-03-08 ENCOUNTER — Encounter: Payer: Self-pay | Admitting: Family Medicine

## 2017-03-11 ENCOUNTER — Encounter: Payer: Self-pay | Admitting: Cardiology

## 2017-03-13 ENCOUNTER — Encounter: Payer: Self-pay | Admitting: Cardiology

## 2017-03-14 ENCOUNTER — Telehealth: Payer: Self-pay | Admitting: Cardiology

## 2017-03-14 NOTE — Telephone Encounter (Signed)
Pt says he need an appointment asap. He is supposed to be going on a cruise and he is  having dizzy spells. If he needs to cancel his cruise,he needs to give them notice asap.That way he can cancel his cruise in time and he he will  Hopefully get his refund.

## 2017-03-14 NOTE — Telephone Encounter (Signed)
Spoke to patient. Schedule appointment for Sept 5 at 10 am. Patient verbalized understanding.

## 2017-03-19 ENCOUNTER — Ambulatory Visit (INDEPENDENT_AMBULATORY_CARE_PROVIDER_SITE_OTHER): Payer: Medicare Other | Admitting: Vascular Surgery

## 2017-03-19 ENCOUNTER — Encounter (INDEPENDENT_AMBULATORY_CARE_PROVIDER_SITE_OTHER): Payer: Self-pay | Admitting: Vascular Surgery

## 2017-03-19 VITALS — BP 120/78 | HR 80 | Resp 18 | Ht 69.0 in | Wt 213.0 lb

## 2017-03-19 DIAGNOSIS — I779 Disorder of arteries and arterioles, unspecified: Secondary | ICD-10-CM

## 2017-03-19 DIAGNOSIS — I739 Peripheral vascular disease, unspecified: Secondary | ICD-10-CM | POA: Diagnosis not present

## 2017-03-19 DIAGNOSIS — I10 Essential (primary) hypertension: Secondary | ICD-10-CM | POA: Diagnosis not present

## 2017-03-19 DIAGNOSIS — Z951 Presence of aortocoronary bypass graft: Secondary | ICD-10-CM

## 2017-03-19 DIAGNOSIS — E785 Hyperlipidemia, unspecified: Secondary | ICD-10-CM

## 2017-03-19 NOTE — Assessment & Plan Note (Signed)
blood pressure control important in reducing the progression of atherosclerotic disease. On appropriate oral medications.  

## 2017-03-19 NOTE — Assessment & Plan Note (Signed)
He had noninvasive studies performed about 3 months ago at a cardiologist's office which and review his studies from 2 years ago by Dr. Hart RochesterLawson were similar. He has a distal aortic stenosis or occlusion by noninvasive studies with ABIs of 0.8 range bilaterally. Symptoms are mild. No current intervention necessary. I would plan to follow this with ABIs on an annual basis unless he developed lifestyle limiting symptoms. Continue Plavix and statin agent

## 2017-03-19 NOTE — Patient Instructions (Signed)

## 2017-03-19 NOTE — Assessment & Plan Note (Signed)
No current angina 

## 2017-03-19 NOTE — Assessment & Plan Note (Signed)
lipid control important in reducing the progression of atherosclerotic disease. Continue statin therapy  

## 2017-03-19 NOTE — Progress Notes (Signed)
Patient ID: [name], male   DOB: 04-10-1946, 71 y.o.   MRN: [id number]  Chief Complaint  Patient presents with  . New Patient (Initial Visit)    Aortic stenosis    HPI [name] is a 71 y.o. male.  I am asked to see the patient by Dr. Caryl Bis for evaluation of aortic occlusion.  The patient reports pain in his legs with activity at figure is beads or with inclines or steps. He says he can actually walk pretty well on level ground by large. This has been going on for many years. He was first diagnosed with a distal aortic occlusion over 25 years ago by Dr. Kellie Simmering in [place]. Apparently Dr. Kellie Simmering has now retired, and he needs to find a vascular surgeon locally. He denies ulceration or infection. He denies fever or chills. He had noninvasive studies performed about 3 months ago at a cardiologist's office which and review his studies from 2 years ago by Dr. Kellie Simmering were similar. He has a distal aortic stenosis or occlusion by noninvasive studies with ABIs of 0.8 range bilaterally.   Past Medical History:  Diagnosis Date  . Arthritis   . CAD, multiple vessel 03/2105   LM 65%, mLAD 70%, 90% & 80%, o-pCx 75%, mCx 95% & d Cx 70%, OM1 60%, mRCA 90% with 100% dRCA. R-R collaterals  . Chronic distal aortic occlusion (HCC)    By report  . Chronic kidney disease   . COPD (chronic obstructive pulmonary disease) ([place])   . Depression   . Enlarged prostate   . Erectile dysfunction   . Essential hypertension   . History of kidney stones    multiple lithotripsies  . Hyperlipidemia LDL goal <70   . Hypogonadism in male    Previously on testosterone injections, but has not yet restarted until further testing  . Muscle pain   . PAF (paroxysmal atrial fibrillation) ([place]) 03/2015   Noted per-cath & post-op CABG.  Dr. Ellyn Hack, cardiology  . Peripheral vascular disease (HCC)    Bilateral ABIs 0.8  . Renal stones 03/2016  . S/P CABG x 5 04/04/2015   LIMA to LAD, SVG to DIAG, SVG to OM,  seqSVG -PDA- dCx  . SOB (shortness of breath)   . 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 Weakness    weakness bilateral legs, s/p vein harvesting for CABPG    Past Surgical History:  Procedure Laterality Date  . BLEPHAROPLASTY    . CARDIAC CATHETERIZATION  1992  . CARDIAC CATHETERIZATION N/A 03/31/2015   Procedure: Left Heart Cath and Coronary Angiography;  Surgeon: Leonie Man, MD;  Location: [facility] LAB: LM 65%, mLAD 70%, 90% & 80%, o-pCx 75%, mCx 95% & d Cx 70%, OM1 60%, mRCA 90% with 100% dRCA. R-R collaterals --> CABG referral. Also noted to be in Afib.  . Carotid Dopplers  12/30/2016   Patent vertebrals. Mild bilateral carotid plaque, <50% stenosis bilaterally.  . CHOLECYSTECTOMY  2000   also had revision of hernia repair  . CORONARY ARTERY BYPASS GRAFT N/A 04/04/2015   Procedure: CORONARY ARTERY BYPASS GRAFTING (CABG);  Surgeon: Grace Isaac, MD;  Location: [facility];  Service: Open Heart Surgery;  Laterality: N/A;  . CYSTECTOMY     face and scrotum  . CYSTOSCOPY WITH INSERTION OF UROLIFT N/A 11/12/2016   Procedure: CYSTOSCOPY WITH INSERTION OF UROLIFT x4;  Surgeon: Carolan Clines, MD;  Location: [facility];  Service:  Urology;  Laterality: N/A;  . CYSTOSCOPY WITH RETROGRADE PYELOGRAM, URETEROSCOPY AND STENT PLACEMENT Right 06/26/2016   Procedure: CYSTOSCOPY WITH RIGHT RETROGRADE PYELOGRAM, RIGHT URETEROSCOPY , HOLMIUM LASER, BASKET EXTRACTION AND STENT PLACEMENT;  Surgeon: Carolan Clines, MD;  [facility];  Service: Urology;  Laterality: Right;  . Carlton Adam Myoview  June 2010   converted to [facility] from treadmill due to inability to reach target heart rate) --the study was normal with no evidence of ischemia and normal LV function. EF 7  . LITHOTRIPSY    . TEE WITHOUT CARDIOVERSION N/A 04/04/2015   Procedure: TRANSESOPHAGEAL ECHOCARDIOGRAM (TEE);  Surgeon: Grace Isaac, MD;  Location: [facility];  Service: Open Heart  Surgery;  Laterality: N/A;  . TRANSTHORACIC ECHOCARDIOGRAM  12/30/2016   EF 60-65%. GR 1 DD. Mildly dilated ascending aorta. Otherwise essentially normal with no valve lesions.  Marland Kitchen VASECTOMY  1975  . VENTRAL HERNIA REPAIR  4098   complicated by infection    Family History  Problem Relation Age of Onset  . Coronary artery disease Father   . Heart disease Father   . Stroke Mother   . Hearing loss Mother   . Stroke Sister   . Hearing loss Maternal Grandmother   . Prostate cancer Neg Hx   . Kidney disease Neg Hx   . Other Neg Hx        hypogonadism     Social History Social History  Substance Use Topics  . Smoking status: Former Smoker    Packs/day: 2.00    Years: 40.00    Types: Cigarettes    Quit date: 10/17/1992  . Smokeless tobacco: Never Used     Comment: quit 1994  . Alcohol use 0.6 oz/week    1 Standard drinks or equivalent per week     Comment: seldom - vodka or wine  No IVDU  No Known Allergies  Current Outpatient Prescriptions  Medication Sig Dispense Refill  . acetaminophen (TYLENOL) 650 MG CR tablet Take 650 mg by mouth every 8 (eight) hours as needed for pain. Reported on 01/03/2016    . albuterol (PROVENTIL HFA;VENTOLIN HFA) 108 (90 Base) MCG/ACT inhaler Inhale 2 puffs into the lungs every 4 (four) hours as needed for wheezing or shortness of breath. 1 Inhaler 6  . atorvastatin (LIPITOR) 40 MG tablet Take 1 tablet (40 mg total) by mouth daily. (Patient taking differently: Take 40 mg by mouth daily at 6 PM. ) 90 tablet 3  . clopidogrel (PLAVIX) 75 MG tablet Take 1 tablet (75 mg total) by mouth daily. 90 tablet 3  . diphenhydramine-acetaminophen (TYLENOL PM) 25-500 MG TABS tablet Take 1 tablet by mouth at bedtime as needed (sleep).    . finasteride (PROSCAR) 5 MG tablet TAKE 1 TABLET EVERY DAY 90 tablet 0  . fluticasone (FLONASE) 50 MCG/ACT nasal spray Place 2 sprays into both nostrils daily. 16 g 2  . gabapentin (NEURONTIN) 100 MG capsule Take 1 capsule (100 mg  total) by mouth 2 (two) times daily. (Patient taking differently: Take 100 mg by mouth 2 (two) times daily. In the morning and midday) 180 capsule 1  . gabapentin (NEURONTIN) 400 MG capsule Take 1 capsule (400 mg total) by mouth every evening. (Patient taking differently: Take 400 mg by mouth at bedtime. ) 90 capsule 1  . metoprolol tartrate (LOPRESSOR) 25 MG tablet Take 0.5 tablets (12.5 mg total) by mouth 2 (two) times daily. (Patient taking differently: Take 12.5 mg by mouth daily. ) 90 tablet 3  .  NITROSTAT 0.4 MG SL tablet PLACE 1 TABLET (0.4 MG TOTAL) UNDER THE TONGUE EVERY 5 (FIVE) MINUTES AS NEEDED FOR CHEST PAIN. 25 tablet 5  . tamsulosin (FLOMAX) 0.4 MG CAPS capsule Take 0.4 mg by mouth daily.     Marland Kitchen testosterone cypionate (DEPOTESTOSTERONE CYPIONATE) 200 MG/ML injection Inject 100 mg into the muscle every 14 (fourteen) days.     . traMADol-acetaminophen (ULTRACET) 37.5-325 MG tablet Take 1 tablet by mouth every 6 (six) hours as needed for moderate pain. 30 tablet 0  . triamcinolone (KENALOG) 0.025 % cream APPLY A SMALL DAB TO PENILE LESION ONCE DAILY  1   No current facility-administered medications for this visit.       REVIEW OF SYSTEMS (Negative unless checked)  Constitutional: []Weight loss  []Fever  []Chills Cardiac: []Chest pain   []Chest pressure   [x]Palpitations   []Shortness of breath when laying flat   []Shortness of breath at rest   [x]Shortness of breath with exertion. Vascular:  [x]Pain in legs with walking   []Pain in legs at rest   []Pain in legs when laying flat   [x]Claudication   []Pain in feet when walking  []Pain in feet at rest  []Pain in feet when laying flat   []History of DVT   []Phlebitis   []Swelling in legs   []Varicose veins   []Non-healing ulcers Pulmonary:   []Uses home oxygen   []Productive cough   []Hemoptysis   []Wheeze  [x]COPD   []Asthma Neurologic:  []Dizziness  []Blackouts   []Seizures   []History of stroke   []History of TIA  []Aphasia    []Temporary blindness   []Dysphagia   []Weakness or numbness in arms   []Weakness or numbness in legs Musculoskeletal:  [x]Arthritis   []Joint swelling   []Joint pain   []Low back pain Hematologic:  []Easy bruising  []Easy bleeding   []Hypercoagulable state   []Anemic  []Hepatitis Gastrointestinal:  []Blood in stool   []Vomiting blood  []Gastroesophageal reflux/heartburn   []Abdominal pain Genitourinary:  []Chronic kidney disease   []Difficult urination  []Frequent urination  []Burning with urination   []Hematuria Skin:  []Rashes   []Ulcers   []Wounds Psychological:  []History of anxiety   [] History of major depression.    Physical Exam BP 120/78 (BP Location: Right Arm)   Pulse 80   Resp 18   Ht 5' 9" (1.753 m)   Wt 96.6 kg (213 lb)   BMI 31.45 kg/m  Gen:  WD/WN, NAD Head: [place]/AT, No temporalis wasting.  Ear/Nose/Throat: Hearing grossly intact, nares w/o erythema or drainage, oropharynx w/o Erythema/Exudate Eyes: Conjunctiva clear, sclera non-icteric  Neck: trachea midline.  No JVD.  Pulmonary:  Good air movement, respirations not labored, no use of accessory muscles Cardiac: RRR, normal S1, S2 Vascular:  Vessel Right Left  Radial Palpable Palpable                          PT Palpable Palpable  DP Palpable Palpable   Gastrointestinal: soft, non-tender/non-distended.  Musculoskeletal: M/S 5/5 throughout.  Extremities without ischemic changes.  No deformity or atrophy.  Neurologic: Sensation grossly intact in extremities.  Symmetrical.  Speech is fluent. Motor exam as listed above. Psychiatric: Judgment intact, Mood & affect appropriate for pt's clinical situation. Dermatologic: No rashes or ulcers noted.  No cellulitis or open wounds.  Radiology No results found.  Labs Recent Results (from the past 2160 hour(s))  CBC     Status:  Abnormal   Collection Time: 12/29/16 10:33 PM  Result Value Ref Range   WBC 11.2 (H) 3.8 - 10.6 K/uL   RBC 4.37 (L) 4.40 - 5.90 MIL/uL    Hemoglobin 14.1 13.0 - 18.0 g/dL   HCT 41.5 40.0 - 52.0 %   MCV 95.0 80.0 - 100.0 fL   MCH 32.3 26.0 - 34.0 pg   MCHC 34.0 32.0 - 36.0 g/dL   RDW 12.3 11.5 - 14.5 %   Platelets 194 150 - 440 K/uL  Urinalysis, Complete w Microscopic     Status: Abnormal   Collection Time: 12/29/16 10:33 PM  Result Value Ref Range   Color, Urine YELLOW (A) YELLOW   APPearance CLEAR (A) CLEAR   Specific Gravity, Urine 1.021 1.005 - 1.030   pH 5.0 5.0 - 8.0   Glucose, UA NEGATIVE NEGATIVE mg/dL   Hgb urine dipstick NEGATIVE NEGATIVE   Bilirubin Urine NEGATIVE NEGATIVE   Ketones, ur NEGATIVE NEGATIVE mg/dL   Protein, ur NEGATIVE NEGATIVE mg/dL   Nitrite NEGATIVE NEGATIVE   Leukocytes, UA NEGATIVE NEGATIVE   RBC / HPF 0-5 0 - 5 RBC/hpf   WBC, UA 0-5 0 - 5 WBC/hpf   Bacteria, UA NONE SEEN NONE SEEN   Squamous Epithelial / LPF NONE SEEN NONE SEEN   Mucus PRESENT   Comprehensive metabolic panel     Status: Abnormal   Collection Time: 12/29/16 10:33 PM  Result Value Ref Range   Sodium 139 135 - 145 mmol/L   Potassium 3.9 3.5 - 5.1 mmol/L   Chloride 108 101 - 111 mmol/L   CO2 25 22 - 32 mmol/L   Glucose, Bld 172 (H) 65 - 99 mg/dL   BUN 22 (H) 6 - 20 mg/dL   Creatinine, Ser 1.20 0.61 - 1.24 mg/dL   Calcium 8.6 (L) 8.9 - 10.3 mg/dL   Total Protein 6.3 (L) 6.5 - 8.1 g/dL   Albumin 3.5 3.5 - 5.0 g/dL   AST 21 15 - 41 U/L   ALT 19 17 - 63 U/L   Alkaline Phosphatase 55 38 - 126 U/L   Total Bilirubin 0.5 0.3 - 1.2 mg/dL   GFR calc non Af Amer 59 (L) >60 mL/min   GFR calc Af Amer >60 >60 mL/min    Comment: (NOTE) The eGFR has been calculated using the CKD EPI equation. This calculation has not been validated in all clinical situations. eGFR's persistently <60 mL/min signify possible Chronic Kidney Disease.    Anion gap 6 5 - 15  Troponin I     Status: None   Collection Time: 12/29/16 10:33 PM  Result Value Ref Range   Troponin I <0.03 <0.03 ng/mL  TSH     Status: None   Collection Time: 12/29/16  10:33 PM  Result Value Ref Range   TSH 2.753 0.350 - 4.500 uIU/mL    Comment: Performed by a 3rd Generation assay with a functional sensitivity of <=0.01 uIU/mL.  Hemoglobin A1c     Status: Abnormal   Collection Time: 12/29/16 10:33 PM  Result Value Ref Range   Hgb A1c MFr Bld 6.2 (H) 4.8 - 5.6 %    Comment: (NOTE)         Pre-diabetes: 5.7 - 6.4         Diabetes: >6.4         Glycemic control for adults with diabetes: <7.0    Mean Plasma Glucose 131 mg/dL    Comment: (NOTE) Performed At: [facility]  [place], [place] [id number] Lindon Romp MD HF:[id number]   ECHOCARDIOGRAM COMPLETE     Status: None   Collection Time: 12/30/16 10:14 AM  Result Value Ref Range   Weight 3,492.8 oz   Height 69 in   BP 149/68 mmHg  Troponin I     Status: None   Collection Time: 12/30/16  3:06 PM  Result Value Ref Range   Troponin I <0.03 <0.03 ng/mL    Assessment/Plan:  S/P CABG x 5 No current angina  Dyslipidemia, goal LDL below 70 lipid control important in reducing the progression of atherosclerotic disease. Continue statin therapy   PAD (peripheral artery disease) He had noninvasive studies performed about 3 months ago at a cardiologist's office which and review his studies from 2 years ago by Dr. Kellie Simmering were similar. He has a distal aortic stenosis or occlusion by noninvasive studies with ABIs of 0.8 range bilaterally. Symptoms are mild. No current intervention necessary. I would plan to follow this with ABIs on an annual basis unless he developed lifestyle limiting symptoms. Continue Plavix and statin agent  Essential hypertension blood pressure control important in reducing the progression of atherosclerotic disease. On appropriate oral medications.       Leotis Pain 03/19/2017, 1:54 PM   This note was created with Dragon medical transcription system.  Any errors from dictation are unintentional.

## 2017-03-20 ENCOUNTER — Encounter: Payer: Self-pay | Admitting: Cardiology

## 2017-03-20 ENCOUNTER — Ambulatory Visit (INDEPENDENT_AMBULATORY_CARE_PROVIDER_SITE_OTHER): Payer: Medicare Other | Admitting: Cardiology

## 2017-03-20 ENCOUNTER — Ambulatory Visit (INDEPENDENT_AMBULATORY_CARE_PROVIDER_SITE_OTHER): Payer: Medicare Other | Admitting: Family Medicine

## 2017-03-20 ENCOUNTER — Encounter: Payer: Self-pay | Admitting: Family Medicine

## 2017-03-20 ENCOUNTER — Ambulatory Visit: Payer: Self-pay

## 2017-03-20 VITALS — BP 112/68 | HR 64 | Ht 69.0 in | Wt 214.0 lb

## 2017-03-20 VITALS — BP 110/62 | HR 68 | Wt 215.0 lb

## 2017-03-20 DIAGNOSIS — I951 Orthostatic hypotension: Secondary | ICD-10-CM | POA: Diagnosis not present

## 2017-03-20 DIAGNOSIS — I209 Angina pectoris, unspecified: Secondary | ICD-10-CM

## 2017-03-20 DIAGNOSIS — I779 Disorder of arteries and arterioles, unspecified: Secondary | ICD-10-CM

## 2017-03-20 DIAGNOSIS — M7062 Trochanteric bursitis, left hip: Secondary | ICD-10-CM

## 2017-03-20 DIAGNOSIS — I25119 Atherosclerotic heart disease of native coronary artery with unspecified angina pectoris: Secondary | ICD-10-CM | POA: Diagnosis not present

## 2017-03-20 DIAGNOSIS — R42 Dizziness and giddiness: Secondary | ICD-10-CM

## 2017-03-20 DIAGNOSIS — M7061 Trochanteric bursitis, right hip: Secondary | ICD-10-CM

## 2017-03-20 DIAGNOSIS — M25551 Pain in right hip: Secondary | ICD-10-CM | POA: Diagnosis not present

## 2017-03-20 DIAGNOSIS — R5382 Chronic fatigue, unspecified: Secondary | ICD-10-CM

## 2017-03-20 NOTE — Assessment & Plan Note (Signed)
As stated above. Encourage the home exercises. Encourage him to take the gabapentin on a lower dose. We'll see how patient does.

## 2017-03-20 NOTE — Progress Notes (Signed)
[name] [address] [place], [place] [address] Phone: [phone] Subjective:    I'm seeing this patient by the request  of:  Glori Luis, MD   CC: Bilateral hip pain follow-up  [id number]  [name] is a 71 y.o. male coming in with complaint of left hip pain. Has been multiple months.Been greater than 3 months at this time. Patient is having worsening symptoms again. Seems to be bilateral. Patient was on gabapentin but is having some difficulty with concentration as well as dizziness. Patient rates the severity of pain as 5 out of 10. Patient states that unfortunately started wake him up at night as well. Mild back pain associated with it  Past Medical History:  Diagnosis Date  . Arthritis   . CAD, multiple vessel 03/2105   LM 65%, mLAD 70%, 90% & 80%, o-pCx 75%, mCx 95% & d Cx 70%, OM1 60%, mRCA 90% with 100% dRCA. R-R collaterals  . Chronic distal aortic occlusion (HCC)    By report  . Chronic kidney disease   . COPD (chronic obstructive pulmonary disease) ([facility])   . Depression   . Enlarged prostate   . Erectile dysfunction   . Essential hypertension   . History of kidney stones    multiple lithotripsies  . Hyperlipidemia LDL goal <70   . Hypogonadism in male    Previously on testosterone injections, but has not yet restarted until further testing  . Muscle pain   . PAF (paroxysmal atrial fibrillation) ([facility]) 03/2015   Noted per-cath & post-op CABG.  Dr. [name], cardiology  . Peripheral vascular disease (HCC)    Bilateral ABIs 0.8  . Renal stones 03/2016  . S/P CABG x 5 04/04/2015   LIMA to LAD, SVG to DIAG, SVG to OM, seqSVG -PDA- dCx  . SOB (shortness of breath)   . 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 Weakness    weakness bilateral legs, s/p vein harvesting for CABPG   Past Surgical History:  Procedure Laterality Date  . BLEPHAROPLASTY    . CARDIAC CATHETERIZATION  1992  .  CARDIAC CATHETERIZATION N/A 03/31/2015   Procedure: Left Heart Cath and Coronary Angiography;  Surgeon: Marykay Lex, MD;  [facility]: [facility] INVASIVE CV LAB: LM 65%, mLAD 70%, 90% & 80%, o-pCx 75%, mCx 95% & d Cx 70%, OM1 60%, mRCA 90% with 100% dRCA. R-R collaterals --> CABG referral. Also noted to be in Afib.  . Carotid Dopplers  12/30/2016   Patent vertebrals. Mild bilateral carotid plaque, <50% stenosis bilaterally.  . CHOLECYSTECTOMY  2000   also had revision of hernia repair  . CORONARY ARTERY BYPASS GRAFT N/A 04/04/2015   Procedure: CORONARY ARTERY BYPASS GRAFTING (CABG);  Surgeon: [name], MD;  Location: [facility];  Service: Open Heart Surgery;  Laterality: N/A;  . CYSTECTOMY     face and scrotum  . CYSTOSCOPY WITH INSERTION OF UROLIFT N/A 11/12/2016   Procedure: CYSTOSCOPY WITH INSERTION OF UROLIFT x4;  Surgeon: Jethro Bolus, MD;  [facility];  Service: Urology;  Laterality: N/A;  . CYSTOSCOPY WITH RETROGRADE PYELOGRAM, URETEROSCOPY AND STENT PLACEMENT Right 06/26/2016   Procedure: CYSTOSCOPY WITH RIGHT RETROGRADE PYELOGRAM, RIGHT URETEROSCOPY , HOLMIUM LASER, BASKET EXTRACTION AND STENT PLACEMENT;  Surgeon: Jethro Bolus, MD;  [facility];  Service: Urology;  Laterality: Right;  . Eugenie Birks Myoview  June 2010   converted to [place] from treadmill due to inability to  reach target heart rate) --the study was normal with no evidence of ischemia and normal LV function. EF 7  . LITHOTRIPSY    . TEE WITHOUT CARDIOVERSION N/A 04/04/2015   Procedure: TRANSESOPHAGEAL ECHOCARDIOGRAM (TEE);  Surgeon: Delight OvensEdward B Gerhardt, MD;  [facility];  Service: Open Heart Surgery;  Laterality: N/A;  . TRANSTHORACIC ECHOCARDIOGRAM  12/30/2016   EF 60-65%. GR 1 DD. Mildly dilated ascending aorta. Otherwise essentially normal with no valve lesions.  Marland Kitchen. VASECTOMY  1975  . VENTRAL HERNIA REPAIR  1994   complicated by infection   Social History   Social History  . Marital status:  Married    Spouse name: N/A  . Number of children: 2  . Years of education: 14   Occupational History  . Not on file.   Social History Main Topics  . Smoking status: Former Smoker    Packs/day: 2.00    Years: 40.00    Types: Cigarettes    Quit date: 10/17/1992  . Smokeless tobacco: Never Used     Comment: quit 1994  . Alcohol use 0.6 oz/week    1 Standard drinks or equivalent per week     Comment: seldom - vodka or wine  . Drug use: No  . Sexual activity: No   Other Topics Concern  . Not on file   Social History Narrative   [place] in [place]. No pets   Two children.      Former smoker. Quit in 1994. Prior to quitting was unable to walk without exertional dyspnea.      Work - Retired. 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      Hobbies - reading, drives Corvette, chuch   No Known Allergies Family History  Problem Relation Age of Onset  . Coronary artery disease Father   . Heart disease Father   . Stroke Mother   . Hearing loss Mother   . Stroke Sister   . Hearing loss Maternal Grandmother   . Prostate cancer Neg Hx   . Kidney disease Neg Hx   . Other Neg Hx        hypogonadism      Past medical history, social, surgical and family history all reviewed in electronic medical record.   Review of Systems: No headache, visual changes, nausea, vomiting, diarrhea, constipation, dizziness, abdominal pain, skin rash, fevers, chills, night sweats, weight loss, swollen lymph nodes, body aches, joint swelling, muscle aches, chest pain, shortness of breath, mood changes.    Objective  Blood pressure 110/62, pulse 68, weight 215 lb (97.5 kg).  Systems examined below as of 03/20/17 General: NAD A&O x3 mood, affect normal  HEENT: Pupils equal, extraocular movements intact no nystagmus Respiratory: not short of breath at rest or with speaking Cardiovascular: No lower extremity edema, non tender Skin: Warm  dry intact with no signs of infection or rash on extremities or on axial skeleton. Abdomen: Soft nontender, no masses Neuro: Cranial nerves  intact, neurovascularly intact in all extremities with 2+ DTRs and 2+ pulses. Lymph: No lymphadenopathy appreciated today  Gait normal with good balance and coordination.  MSK: Non tender with full range of motion and good stability and symmetric strength and tone of shoulders, elbows, wrist,  knee and ankles bilaterally.  Arthritic changes of multiple joints Hip: Bilateral ROM IR: 25 Deg, ER: 35 Deg, Flexion: 120 Deg, Extension: 100 Deg, Abduction: 45 Deg, Adduction: 45 Deg Strength IR: 5/5,  ER: 5/5, Flexion: 5/5, Extension: 5/5, Abduction: 4/5 but symmetric, Adduction: 5/5 Continued tenderness to palpation over the lateral greater trochanter area bilaterally. Fairly severe. Tightness of the Back Paraspinal Musculature As Well.   Procedure: Real-time Ultrasound Guided Injection of right greater trochanteric bursitis secondary to patient's body habitus Device: GE Logiq Q7 Ultrasound guided injection is preferred based studies that show increased duration, increased effect, greater accuracy, decreased procedural pain, increased response rate, and decreased cost with ultrasound guided versus blind injection.  Verbal informed consent obtained.  Time-out conducted.  Noted no overlying erythema, induration, or other signs of local infection.  Skin prepped in a sterile fashion.  Local anesthesia: Topical Ethyl chloride.  With sterile technique and under real time ultrasound guidance:  Greater trochanteric area was visualized and patient's bursa was noted. A 22-gauge 3 inch needle was inserted and 4 cc of 0.5% Marcaine and 1 cc of Kenalog 40 mg/dL was injected. Pictures taken Completed without difficulty  Pain immediately resolved suggesting accurate placement of the medication.  Advised to call if fevers/chills, erythema, induration, drainage, or persistent  bleeding.  Images permanently stored and available for review in the ultrasound unit.  Impression: Technically successful ultrasound guided injection.   Procedure: Real-time Ultrasound Guided Injection of left  greater trochanteric bursitis secondary to patient's body habitus Device: GE Logiq Q7  Ultrasound guided injection is preferred based studies that show increased duration, increased effect, greater accuracy, decreased procedural pain, increased response rate, and decreased cost with ultrasound guided versus blind injection.  Verbal informed consent obtained.  Time-out conducted.  Noted no overlying erythema, induration, or other signs of local infection.  Skin prepped in a sterile fashion.  Local anesthesia: Topical Ethyl chloride.  With sterile technique and under real time ultrasound guidance:  Greater trochanteric area was visualized and patient's bursa was noted. A 22-gauge 3 inch needle was inserted and 4 cc of 0.5% Marcaine and 1 cc of Kenalog 40 mg/dL was injected. Pictures taken Completed without difficulty  Pain immediately resolved suggesting accurate placement of the medication.  Advised to call if fevers/chills, erythema, induration, drainage, or persistent bleeding.  Images permanently stored and available for review in the ultrasound unit.  Impression: Technically successful ultrasound guided injection.       Impression and Recommendations:     This case required medical decision making of moderate complexity.

## 2017-03-20 NOTE — Progress Notes (Signed)
PCP: Glori Luis, MD  Clinic Note: Chief Complaint  Patient presents with  . Follow-up    Work in visit. History of CAD and A. fib as well as PAD/occluded aorta  . Headache    Dizzy  . Chest Pain    Pressure  . Edema    Ankles and feet.    HPI: [name] is a 71 y.o. male with a PMH below who presents today for Three-month follow-up with multiple complaints noted above. Mostly noting dizziness and nausea as well as erratic blood pressures. .He has a history of CAD noticed for progressive angina and referred for CABG for multivessel disease. He also has long-standing known occlusion of the distal aorta. He had atrial fibrillation in the setting of his progressive angina and pre-/peri-CABG timeframe.  [name] was last seen on 01/03/2017 follow-up from hospitalization for near syncope  Recent Hospitalizations: No hospitalizationsSince last visit  Studies Personally Reviewed - (if available, images/films reviewed: From Epic Chart or Care Everywhere)  No new studies  Interval History: [name] presents today after several emails back and forth about quite confusing symptoms of dizziness, fatigue and mild edema.  He also does notes generalized fatigue and lack of energy. Another big complaint is balance issues where he deathly notes that he will get dizzy if he closes his eyes (in the shower).    He has not had any chest tightness or pressure with rest or exertion, but has not really been doing much exertion to see if he has any exertional dyspnea. He does have some ankle edema, but no real PND or orthopnea. One thing he has noted is that he continues to have a hard time losing weight with significant truncal obesity.  Although he notices having dizziness, he denies any syncope or near-syncope, TIA or amaurosis fugax symptoms. He really doesn't walk enough to actually noticed any claudication symptoms. His dizziness is associated with nausea. He is also having fluctuations in  blood pressure from high to low. He has not had any episodes like the one that took him to the emergency room back in June.  These episodes happen pretty much out of the blue sometimes and he doesn't have any warning. He denies any rapid irregular heartbeats or palpitations.  No melena, hematochezia, hematuria, or epstaxis.  He does not seem to be too troubled by the sternal nonunion anymore unless he moves certain ways.  ROS: A comprehensive was performed. Review of Systems  Constitutional: Positive for malaise/fatigue.  HENT: Positive for congestion. Negative for nosebleeds and sinus pain.   Respiratory: Positive for shortness of breath (On occasion with walking). Negative for cough.   Gastrointestinal: Positive for nausea (Without abdominal pain). Negative for constipation.  Genitourinary: Positive for frequency. Negative for dysuria and hematuria.  Musculoskeletal: Positive for joint pain. Negative for falls.  Neurological: Positive for dizziness, weakness (Globalized weakness, notably in the legs) and headaches. Negative for focal weakness, seizures and loss of consciousness.  Endo/Heme/Allergies: Negative for environmental allergies. Bruises/bleeds easily (Also notes very easy skin care).  Psychiatric/Behavioral: Positive for memory loss.       Not sleeping well  All other systems reviewed and are negative.  I have reviewed and (if needed) personally updated the patient's problem list, medications, allergies, past medical and surgical history, social and family history.   Past Medical History:  Diagnosis Date  . Arthritis   . CAD, multiple vessel 03/2105   LM 65%, mLAD 70%, 90% & 80%, o-pCx 75%, mCx 95% &  d Cx 70%, OM1 60%, mRCA 90% with 100% dRCA. R-R collaterals  . Chronic distal aortic occlusion (HCC)    By report  . Chronic kidney disease   . COPD (chronic obstructive pulmonary disease) (HCC)   . Depression   . Enlarged prostate   . Erectile dysfunction   . Essential  hypertension   . History of kidney stones    multiple lithotripsies  . Hyperlipidemia LDL goal <70   . Hypogonadism in male    Previously on testosterone injections, but has not yet restarted until further testing  . Muscle pain   . PAF (paroxysmal atrial fibrillation) (HCC) 03/2015   Noted per-cath & post-op CABG.  Dr. Herbie BaltimoreHarding, cardiology  . Peripheral vascular disease (HCC)    Bilateral ABIs 0.8  . Renal stones 03/2016  . S/P CABG x 5 04/04/2015   LIMA to LAD, SVG to DIAG, SVG to OM, seqSVG -PDA- dCx  . SOB (shortness of breath)   . 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 Weakness    weakness bilateral legs, s/p vein harvesting for CABPG    Past Surgical History:  Procedure Laterality Date  . BLEPHAROPLASTY    . CARDIAC CATHETERIZATION  1992  . CARDIAC CATHETERIZATION N/A 03/31/2015   Procedure: Left Heart Cath and Coronary Angiography;  Surgeon: Marykay Lexavid W Harding, MD;  [facility]: LM 65%, mLAD 70%, 90% & 80%, o-pCx 75%, mCx 95% & d Cx 70%, OM1 60%, mRCA 90% with 100% dRCA. R-R collaterals --> CABG referral. Also noted to be in Afib.  . Carotid Dopplers  12/30/2016   Patent vertebrals. Mild bilateral carotid plaque, <50% stenosis bilaterally.  . CHOLECYSTECTOMY  2000   also had revision of hernia repair  . CORONARY ARTERY BYPASS GRAFT N/A 04/04/2015   Procedure: CORONARY ARTERY BYPASS GRAFTING (CABG);  Surgeon: Delight OvensEdward B Gerhardt, MD;  [facility];  Service: Open Heart Surgery;  Laterality: N/A;  . CYSTECTOMY     face and scrotum  . CYSTOSCOPY WITH INSERTION OF UROLIFT N/A 11/12/2016   Procedure: CYSTOSCOPY WITH INSERTION OF UROLIFT x4;  Surgeon: Jethro Bolusannenbaum, Sigmund, MD;  Location: [facility] ORS;  Service: Urology;  Laterality: N/A;  . CYSTOSCOPY WITH RETROGRADE PYELOGRAM, URETEROSCOPY AND STENT PLACEMENT Right 06/26/2016   Procedure: CYSTOSCOPY WITH RIGHT RETROGRADE PYELOGRAM, RIGHT URETEROSCOPY , HOLMIUM LASER, BASKET  EXTRACTION AND STENT PLACEMENT;  Surgeon: Jethro BolusSigmund Tannenbaum, MD;  Location: [facility] OR[facility];  Service: Urology;  Laterality: Right;  . Eugenie BirksLexiscan Myoview  June 2010   [facility] due to inability to reach target heart rate) --the study was normal with no evidence of ischemia and normal LV function. EF 7  . LITHOTRIPSY    . TEE WITHOUT CARDIOVERSION N/A 04/04/2015   Procedure: TRANSESOPHAGEAL ECHOCARDIOGRAM (TEE);  Surgeon: Delight OvensEdward B Gerhardt, MD;  [facility];  Service: Open Heart Surgery;  Laterality: N/A;  . TRANSTHORACIC ECHOCARDIOGRAM  12/30/2016   EF 60-65%. GR 1 DD. Mildly dilated ascending aorta. Otherwise essentially normal with no valve lesions.  Marland Kitchen. VASECTOMY  1975  . VENTRAL HERNIA REPAIR  1994   complicated by infection    Current Meds  Medication Sig  . acetaminophen (TYLENOL) 650 MG CR tablet Take 650 mg by mouth every 8 (eight) hours as needed for pain. Reported on 01/03/2016  . albuterol (PROVENTIL HFA;VENTOLIN HFA) 108 (90 Base) MCG/ACT inhaler Inhale 2 puffs into the lungs every 4 (four) hours as needed for wheezing  or shortness of breath.  Marland Kitchen atorvastatin (LIPITOR) 40 MG tablet Take 1 tablet (40 mg total) by mouth daily. (Patient taking differently: Take 40 mg by mouth daily at 6 PM. )  . clopidogrel (PLAVIX) 75 MG tablet Take 1 tablet (75 mg total) by mouth daily.  . diphenhydramine-acetaminophen (TYLENOL PM) 25-500 MG TABS tablet Take 1 tablet by mouth at bedtime as needed (sleep).  . finasteride (PROSCAR) 5 MG tablet TAKE 1 TABLET EVERY DAY  . fluticasone (FLONASE) 50 MCG/ACT nasal spray Place 2 sprays into both nostrils daily.  Marland Kitchen gabapentin (NEURONTIN) 100 MG capsule Take 1 capsule (100 mg total) by mouth 2 (two) times daily. (Patient taking differently: Take 100 mg by mouth 2 (two) times daily. In the morning and midday)  . gabapentin (NEURONTIN) 400 MG capsule Take 1 capsule (400 mg total) by mouth every evening. (Patient taking differently: Take 400 mg by  mouth at bedtime. )  . metoprolol tartrate (LOPRESSOR) 25 MG tablet Take 0.5 tablets (12.5 mg total) by mouth 2 (two) times daily. (Patient taking differently: Take 12.5 mg by mouth daily. )  . NITROSTAT 0.4 MG SL tablet PLACE 1 TABLET (0.4 MG TOTAL) UNDER THE TONGUE EVERY 5 (FIVE) MINUTES AS NEEDED FOR CHEST PAIN.  . tamsulosin (FLOMAX) 0.4 MG CAPS capsule Take 0.4 mg by mouth daily.   Marland Kitchen testosterone cypionate (DEPOTESTOSTERONE CYPIONATE) 200 MG/ML injection Inject 100 mg into the muscle every 14 (fourteen) days.   . traMADol-acetaminophen (ULTRACET) 37.5-325 MG tablet Take 1 tablet by mouth every 6 (six) hours as needed for moderate pain.  Marland Kitchen triamcinolone (KENALOG) 0.025 % cream APPLY A SMALL DAB TO PENILE LESION ONCE DAILY    No Known Allergies  Social History   Social History  . Marital status: Married    Spouse name: N/A  . Number of children: 2  . Years of education: 57   Social History Main Topics  . Smoking status: Former Smoker    Packs/day: 2.00    Years: 40.00    Types: Cigarettes    Quit date: 10/17/1992  . Smokeless tobacco: Never Used     Comment: quit 1994  . Alcohol use 0.6 oz/week    1 Standard drinks or equivalent per week     Comment: seldom - vodka or wine  . Drug use: No  . Sexual activity: No   Other Topics Concern  . None   Social History Narrative   Lives in [place] with wife. No pets   Two children.      Former smoker. Quit in 1994. Prior to quitting was unable to walk without exertional dyspnea.      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      Hobbies - reading, drives Corvette, chuch    family history includes Coronary artery disease in his father; Hearing loss in his maternal grandmother and mother; Heart disease in his father; Stroke in his mother and sister.  Wt Readings from Last 3 Encounters:  03/20/17 215 lb (97.5 kg)  03/20/17 214 lb (97.1 kg)  03/19/17 213  lb (96.6 kg)    PHYSICAL EXAM BP 112/68   Pulse 64   Ht 5\' 9"  (1.753 m)   Wt 214 lb (97.1 kg)   BMI 31.60 kg/m  Physical Exam  Constitutional: He is oriented to person, place, and time. He appears well-developed and well-nourished. No distress.  He appears older than his stated  age. Silvestre Gunner to be declining in functional status.  Cardiovascular: Normal rate, regular rhythm, normal heart sounds and intact distal pulses.  Exam reveals no gallop and no friction rub.   No murmur heard. Pulmonary/Chest: Effort normal and breath sounds normal. No respiratory distress. He has no wheezes. He has no rales.  Abdominal: Soft. Bowel sounds are normal. He exhibits no distension. There is no tenderness. There is no rebound.  Truncal obesity  Musculoskeletal: Normal range of motion. He exhibits edema (Roughly 2+ bilateral ankle edema).  Neurological: He is alert and oriented to person, place, and time.  Minimal swaying with Romberg.  Skin: Skin is warm and dry. No rash noted. No erythema.  Psychiatric: His behavior is normal. Judgment and thought content normal.  Somewhat flat affect but normal mood  Nursing note and vitals reviewed.   Adult ECG Report  Rate: 64 ;  Rhythm: normal sinus rhythm, premature atrial contractions (PAC) and Nonspecific ST and T-wave changes. Otherwise normal axis, intervals and durations;   Narrative Interpretation: Stable EKG   Other studies Reviewed: Additional studies/ records that were reviewed today include:  Recent Labs:   Lab Results  Component Value Date   CREATININE 1.20 12/29/2016   BUN 22 (H) 12/29/2016   NA 139 12/29/2016   K 3.9 12/29/2016   CL 108 12/29/2016   CO2 25 12/29/2016   Lab Results  Component Value Date   CHOL 155 10/30/2016   HDL 36 (L) 10/30/2016   LDLCALC 63 10/30/2016   TRIG 282 (H) 10/30/2016   CHOLHDL 4.3 10/30/2016     ASSESSMENT / PLAN: Problem List Items Addressed This Visit    Chronic fatigue    He thinks his energy  level is getting better with testosterone supplementation. However I part of the ear out what for cardiac standpoint is leading to this. He is on very low dose of metoprolol which we have backed off over the last year or so. I've asked that he take his metoprolol in the evening (he is only been taking 1 tablet daily)      Coronary artery disease involving native coronary artery with angina pectoris (HCC) (Chronic)    Doesn't sound is actually actively having anginal symptoms. He does have some exertional dyspnea associated with his fatigue, but it doesn't sound like CHF or angina.  Only able to tolerate very low dose once daily beta blocker. Cannot really further titrate because of his fatigue. He is on statin and Plavix, however with his frequent skin tears and bruising/bleeding, I recommend that he take Plavix every other day.  Intention is to check a follow-up Myoview in 2020, however symptoms proceed to get worse, we may want to consider checking earlier      Dizziness of unknown cause - Primary (Chronic)    Unfortunately, is very hard to tell what his dizziness is related to. There is been some concern about possibly being orthostatic. Since he notices this mostly during the day, and have him switch his metoprolol dose to the nighttime I'm concerned that his symptoms could potentially be related to gabapentin which was recently increased 400 mg in the evening. I've asked the back down to 200 mg twice a day as opposed to 200/400.  I'm also concerned about the possibility of Proscar or Flomax being related to his dizziness as well and they're both potentially causes for orthostatic dizziness and nausea.  Essentially recommended adequate hydration. Even though he is noticing swelling, this is probably not related to heart  failure.  A lot of this is related to balance issues with increasing age. Recommend walking with a cane.      Orthostatic hypotension    He does have labile up and down  blood pressures and is only on low-dose metoprolol. Other medications that can be related to orthostatic hypotension or Proscar and Flomax.  Continue compression stockings and adequate hydration. May need to fully stop metoprolol and only uses when necessary.         Current medicines are reviewed at length with the patient today. (+/- concerns) not sure of med Rxn. The following changes have been made: n/a  Patient Instructions  MEDICATION INSTRUCTIONS  CHANGES  STOP CLOPIDOGREL  FOR 5 DAYS THEN START EVERY OTHER DAY.   START TAKING  METOPROLOL IN THE EVENING TIME.  RECOMMEND USING A WALKING CANE   DISCUSSION WITH FOLLOWING  UROLOGIST The use of Proscar ? Causing dizziness  Orthopedist Reducing Gabapentin ? Causing symptoms    Your physician recommends that you schedule a follow-up appointment in DEC 2018 WITH DR HARDING.    Studies Ordered:   No orders of the defined types were placed in this encounter.     Bryan Lemma, M.D., M.S. Interventional Cardiologist   Pager # [phone] Phone # [phone] [address] [place], [place] [address]

## 2017-03-20 NOTE — Assessment & Plan Note (Signed)
Repeat injection given a candidate. We discussed icing regimen and home exercises. We discussed objective is a doing which ones to avoid. Patient come back and see me again in 2 weeks if needed. Worsening symptoms before then we will need to consider further evaluation for lumbar radiculopathy.

## 2017-03-20 NOTE — Progress Notes (Signed)
us

## 2017-03-20 NOTE — Patient Instructions (Signed)
Good to see you  Decrease gabapentin to 200mg  at night for next week and see if that helps the dizziness.  Continue to be active If the injections do not work for 3 months we will need to consider MRI of your back  Make an appointment with me just in case for 4 weeks.

## 2017-03-20 NOTE — Patient Instructions (Signed)
MEDICATION INSTRUCTIONS  CHANGES  STOP CLOPIDOGREL  FOR 5 DAYS THEN START EVERY OTHER DAY.   START TAKING  METOPROLOL IN THE EVENING TIME.  RECOMMEND USING A WALKING CANE   DISCUSSION WITH FOLLOWING  UROLOGIST The use of Proscar ? Causing dizziness  Orthopedist Reducing Gabapentin ? Causing symptoms    Your physician recommends that you schedule a follow-up appointment in DEC 2018 WITH DR HARDING.

## 2017-03-21 DIAGNOSIS — E291 Testicular hypofunction: Secondary | ICD-10-CM | POA: Diagnosis not present

## 2017-03-22 ENCOUNTER — Encounter: Payer: Self-pay | Admitting: Cardiology

## 2017-03-22 DIAGNOSIS — R42 Dizziness and giddiness: Secondary | ICD-10-CM | POA: Insufficient documentation

## 2017-03-22 NOTE — Assessment & Plan Note (Signed)
He thinks his energy level is getting better with testosterone supplementation. However I part of the ear out what for cardiac standpoint is leading to this. He is on very low dose of metoprolol which we have backed off over the last year or so. I've asked that he take his metoprolol in the evening (he is only been taking 1 tablet daily)

## 2017-03-22 NOTE — Assessment & Plan Note (Addendum)
Unfortunately, is very hard to tell what his dizziness is related to. There is been some concern about possibly being orthostatic. Since he notices this mostly during the day, and have him switch his metoprolol dose to the nighttime I'm concerned that his symptoms could potentially be related to gabapentin which was recently increased 400 mg in the evening. I've asked the back down to 200 mg twice a day as opposed to 200/400.  I'm also concerned about the possibility of Proscar or Flomax being related to his dizziness as well and they're both potentially causes for orthostatic dizziness and nausea.  Essentially recommended adequate hydration. Even though he is noticing swelling, this is probably not related to heart failure.  A lot of this is related to balance issues with increasing age. Recommend walking with a cane.

## 2017-03-22 NOTE — Assessment & Plan Note (Signed)
He does have labile up and down blood pressures and is only on low-dose metoprolol. Other medications that can be related to orthostatic hypotension or Proscar and Flomax.  Continue compression stockings and adequate hydration. May need to fully stop metoprolol and only uses when necessary.

## 2017-03-22 NOTE — Assessment & Plan Note (Signed)
Doesn't sound is actually actively having anginal symptoms. He does have some exertional dyspnea associated with his fatigue, but it doesn't sound like CHF or angina.  Only able to tolerate very low dose once daily beta blocker. Cannot really further titrate because of his fatigue. He is on statin and Plavix, however with his frequent skin tears and bruising/bleeding, I recommend that he take Plavix every other day.  Intention is to check a follow-up Myoview in 2020, however symptoms proceed to get worse, we may want to consider checking earlier

## 2017-03-25 DIAGNOSIS — Z23 Encounter for immunization: Secondary | ICD-10-CM | POA: Diagnosis not present

## 2017-04-04 DIAGNOSIS — E291 Testicular hypofunction: Secondary | ICD-10-CM | POA: Diagnosis not present

## 2017-04-07 ENCOUNTER — Encounter: Payer: Self-pay | Admitting: Family Medicine

## 2017-04-08 ENCOUNTER — Ambulatory Visit (INDEPENDENT_AMBULATORY_CARE_PROVIDER_SITE_OTHER): Payer: Medicare Other | Admitting: Family Medicine

## 2017-04-08 ENCOUNTER — Encounter: Payer: Self-pay | Admitting: Family Medicine

## 2017-04-08 VITALS — BP 132/70 | HR 82 | Temp 98.3°F | Wt 212.8 lb

## 2017-04-08 DIAGNOSIS — I779 Disorder of arteries and arterioles, unspecified: Secondary | ICD-10-CM | POA: Diagnosis not present

## 2017-04-08 DIAGNOSIS — R42 Dizziness and giddiness: Secondary | ICD-10-CM

## 2017-04-08 DIAGNOSIS — J432 Centrilobular emphysema: Secondary | ICD-10-CM

## 2017-04-08 DIAGNOSIS — M954 Acquired deformity of chest and rib: Secondary | ICD-10-CM

## 2017-04-08 DIAGNOSIS — R918 Other nonspecific abnormal finding of lung field: Secondary | ICD-10-CM

## 2017-04-08 DIAGNOSIS — R911 Solitary pulmonary nodule: Secondary | ICD-10-CM | POA: Diagnosis not present

## 2017-04-08 NOTE — Assessment & Plan Note (Signed)
Breathing is stable. Overall doing well. He'll continue to monitor.

## 2017-04-08 NOTE — Progress Notes (Signed)
  [name], MD Phone: [phone]  [name] is a 71 y.o. male who presents today for follow-up.  Lung nodule: Noted incidentally on CT scan last year. He is due for follow-up imaging. He smoked 2-3 packs per day for 30-35 years. He also has had malunion of the sternum following CABG. Occasionally gets a pinching when he turns wrong or coughs.  Lightheadedness: Notes this is quite a bit improved. He has had very little lightheadedness since we last saw each other. He had one episode where he felt a rush of heat move up his legs and into his body and then felt a little lightheaded for a few seconds and then it resolved. He recently saw his cardiologist who the patient states took him off of Plavix for 2 weeks though he's restarted back on this. He is now down to half a tablet of metoprolol. He overall feels a lot better.  COPD: Notes rare cough. Nonproductive. No wheezing. No shortness of breath. No inhaler use. Overall feels well.   PMH: Former smoker   ROS see history of present illness  Objective  Physical Exam Vitals:   04/08/17 1328  BP: 132/70  Pulse: 82  Temp: 98.3 F (36.8 C)  SpO2: 96%   Laying blood pressure 162/80 pulse 82 Sitting blood pressure 138/80 pulse 95 Standing blood pressure 130/68 pulse 98  BP Readings from Last 3 Encounters:  04/08/17 132/70  03/20/17 110/62  03/20/17 112/68   Wt Readings from Last 3 Encounters:  04/08/17 212 lb 12.8 oz (96.5 kg)  03/20/17 215 lb (97.5 kg)  03/20/17 214 lb (97.1 kg)    Physical Exam  Constitutional: No distress.  HENT:  Head: Normocephalic and atraumatic.  Cardiovascular: Normal rate, regular rhythm and normal heart sounds.   Pulmonary/Chest: Effort normal and breath sounds normal.  Musculoskeletal: He exhibits no edema.  Neurological: He is alert. Gait normal.  Skin: Skin is warm and dry. He is not diaphoretic.     Assessment/Plan: Please see individual problem list.  COPD (chronic obstructive  pulmonary disease) (HCC) Breathing is stable. Overall doing well. He'll continue to monitor.  Solitary pulmonary nodule Due for repeat CT scan. Prior smoker with 60-90 pack year history. CT scan ordered. Advised to contact us if he has not heard anything within a week.  Chest wall deformity Occasionally gets a pinching sensation related to his malunion of his sternum following CABG. He'll continue to monitor.  Dizziness of unknown cause Has improved quite a bit. I do agree with his cardiologist that his symptoms do sound orthostatic at times. Also could be medication related particularly with the Proscar or Flomax. Patient reports today that his urologist did not feel her symptoms are related to those medications. Given that he has been fairly asymptomatic recently we will have him monitor for any worsening. Would consider coming off Proscar or Flomax if he continues to have issues.   Orders Placed This Encounter  Procedures  . CT Chest Wo Contrast    Standing Status:   Future    Standing Expiration Date:   06/08/2018    Order Specific Question:   Preferred imaging location?    Answer:   [facility]    Order Specific Question:   Radiology Contrast Protocol - do NOT remove file path    Answer:   [email or web address]    [name], MD [facility]

## 2017-04-08 NOTE — Assessment & Plan Note (Signed)
Occasionally gets a pinching sensation related to his malunion of his sternum following CABG. He'll continue to monitor.

## 2017-04-08 NOTE — Assessment & Plan Note (Signed)
Has improved quite a bit. I do agree with his cardiologist that his symptoms do sound orthostatic at times. Also could be medication related particularly with the Proscar or Flomax. Patient reports today that his urologist did not feel her symptoms are related to those medications. Given that he has been fairly asymptomatic recently we will have him monitor for any worsening. Would consider coming off Proscar or Flomax if he continues to have issues.

## 2017-04-08 NOTE — Assessment & Plan Note (Addendum)
Due for repeat CT scan. Prior smoker with 60-90 pack year history. CT scan ordered. Advised to contact us if he has not heard anything within a week.

## 2017-04-08 NOTE — Patient Instructions (Signed)
Nice to see you. We'll get you set up for CT scan to follow-up on the lung nodule. You should stay well hydrated. You should monitor your lightheadedness and if this worsens please let us know.

## 2017-04-17 ENCOUNTER — Ambulatory Visit
Admission: RE | Admit: 2017-04-17 | Discharge: 2017-04-17 | Disposition: A | Discharge status: Home / Self Care | Payer: Medicare Other | Source: Ambulatory Visit | Attending: Family Medicine | Admitting: Family Medicine

## 2017-04-17 ENCOUNTER — Ambulatory Visit: Payer: Medicare Other | Admitting: Family Medicine

## 2017-04-17 DIAGNOSIS — R918 Other nonspecific abnormal finding of lung field: Secondary | ICD-10-CM | POA: Diagnosis not present

## 2017-04-17 DIAGNOSIS — I700 Atherosclerosis of aorta: Secondary | ICD-10-CM | POA: Insufficient documentation

## 2017-04-17 DIAGNOSIS — J439 Emphysema, unspecified: Secondary | ICD-10-CM | POA: Insufficient documentation

## 2017-04-17 DIAGNOSIS — R911 Solitary pulmonary nodule: Secondary | ICD-10-CM | POA: Diagnosis present

## 2017-04-17 DIAGNOSIS — N200 Calculus of kidney: Secondary | ICD-10-CM | POA: Diagnosis not present

## 2017-04-18 ENCOUNTER — Other Ambulatory Visit: Payer: Self-pay | Admitting: Family Medicine

## 2017-04-18 DIAGNOSIS — E291 Testicular hypofunction: Secondary | ICD-10-CM | POA: Diagnosis not present

## 2017-04-18 MED ORDER — ATORVASTATIN CALCIUM 40 MG PO TABS: 40.0000 mg | ORAL_TABLET | Freq: Every day | ORAL | 3 refills | Status: DC

## 2017-04-18 NOTE — Telephone Encounter (Signed)
Last OV 04/08/17 last filled 01/25/16  90 3rf by Dr.Walker

## 2017-04-18 NOTE — Telephone Encounter (Signed)
[facility] called to get refill for atorvastatin (LIPITOR) 40 MG tablet   Pharmacy is [facility] - [place], [place] - [address]  Thank you!

## 2017-04-22 ENCOUNTER — Telehealth: Payer: Self-pay

## 2017-04-22 NOTE — Telephone Encounter (Signed)
Patient has been seeing Dr Antoine Primas for hip injections no longer working. He is having trouble getting in and out of car.  Wondering if he should follow up with him. Dr Katrinka Blazing doesn't think he need hip replacement , thinks he may be related to back.   I advised patient would need to schedule appointment . Patient stated that records should be available online for review. Please advise ?

## 2017-04-24 NOTE — Telephone Encounter (Signed)
I think seeing Dr. Katrinka Blazing would be a good idea to see if he feels the patient needs an MRI of his back to evaluate for that as the cause of his discomfort. Thanks.

## 2017-04-25 NOTE — Telephone Encounter (Signed)
Left voice mail to call backLeft voice mail to call back 

## 2017-04-29 ENCOUNTER — Encounter: Payer: Self-pay | Admitting: Family Medicine

## 2017-05-02 DIAGNOSIS — E291 Testicular hypofunction: Secondary | ICD-10-CM | POA: Diagnosis not present

## 2017-05-02 NOTE — Telephone Encounter (Signed)
Spoke with patient and he already spoke Dr Antoine PrimasZachary Smith in regard to below message.

## 2017-05-14 ENCOUNTER — Other Ambulatory Visit: Payer: Self-pay | Admitting: Family Medicine

## 2017-05-14 NOTE — Telephone Encounter (Signed)
Refill done.  

## 2017-05-16 DIAGNOSIS — E291 Testicular hypofunction: Secondary | ICD-10-CM | POA: Diagnosis not present

## 2017-05-21 ENCOUNTER — Other Ambulatory Visit: Payer: Self-pay | Admitting: Family Medicine

## 2017-05-21 DIAGNOSIS — N400 Enlarged prostate without lower urinary tract symptoms: Secondary | ICD-10-CM

## 2017-05-29 ENCOUNTER — Encounter: Payer: Self-pay | Admitting: Family Medicine

## 2017-05-29 ENCOUNTER — Telehealth: Payer: Self-pay | Admitting: Family Medicine

## 2017-05-29 ENCOUNTER — Ambulatory Visit (INDEPENDENT_AMBULATORY_CARE_PROVIDER_SITE_OTHER): Payer: Medicare Other | Admitting: Family Medicine

## 2017-05-29 VITALS — BP 120/74 | HR 80 | Temp 97.6°F | Wt 211.2 lb

## 2017-05-29 DIAGNOSIS — R42 Dizziness and giddiness: Secondary | ICD-10-CM

## 2017-05-29 DIAGNOSIS — H539 Unspecified visual disturbance: Secondary | ICD-10-CM

## 2017-05-29 DIAGNOSIS — I779 Disorder of arteries and arterioles, unspecified: Secondary | ICD-10-CM

## 2017-05-29 NOTE — Patient Instructions (Signed)
It was a pleasure to see you today!  Please wear compression stockings on a regular basis and increase water intake as dehydration can contribute to dizziness.  Also, please monitor blood pressure and document readings to bring to your provider when you return from your cruise.  -Recommend that you see your eye doctor to evaluate your vision and recent change in your prescription in your glasses.  If symptoms are not improving with consistent use of compression stockings and water intake, follow up with your provider.  Dizziness Dizziness is a common problem. It makes you feel unsteady or light-headed. You may feel like you are about to pass out (faint). Dizziness can lead to getting hurt if you stumble or fall. Dizziness can be caused by many things, including:  Medicines.  Not having enough water in your body (dehydration).  Illness.  Follow these instructions at home: Eating and drinking  Drink enough fluid to keep your pee (urine) clear or pale yellow. This helps to keep you from getting dehydrated. Try to drink more clear fluids, such as water.  Do not drink alcohol.  Limit how much caffeine you drink or eat, if your doctor tells you to do that.  Limit how much salt (sodium) you drink or eat, if your doctor tells you to do that. Activity  Avoid making quick movements. ? When you stand up from sitting in a chair, steady yourself until you feel okay. ? In the morning, first sit up on the side of the bed. When you feel okay, stand slowly while you hold onto something. Do this until you know that your balance is fine.  If you need to stand in one place for a long time, move your legs often. Tighten and relax the muscles in your legs while you are standing.  Do not drive or use heavy machinery if you feel dizzy.  Avoid bending down if you feel dizzy. Place items in your home so you can reach them easily without leaning over. Lifestyle  Do not use any products that contain  nicotine or tobacco, such as cigarettes and e-cigarettes. If you need help quitting, ask your doctor.  Try to lower your stress level. You can do this by using methods such as yoga or meditation. Talk with your doctor if you need help. General instructions  Watch your dizziness for any changes.  Take over-the-counter and prescription medicines only as told by your doctor. Talk with your doctor if you think that you are dizzy because of a medicine that you are taking.  Tell a friend or a family member that you are feeling dizzy. If he or she notices any changes in your behavior, have this person call your doctor.  Keep all follow-up visits as told by your doctor. This is important. Contact a doctor if:  Your dizziness does not go away.  Your dizziness or light-headedness gets worse.  You feel sick to your stomach (nauseous).  You have trouble hearing.  You have new symptoms.  You are unsteady on your feet.  You feel like the room is spinning. Get help right away if:  You throw up (vomit) or have watery poop (diarrhea), and you cannot eat or drink anything.  You have trouble: ? Talking. ? Walking. ? Swallowing. ? Using your arms, hands, or legs.  You feel generally weak.  You are not thinking clearly, or you have trouble forming sentences. A friend or family member may notice this.  You have: ? Chest pain. ?  Pain in your belly (abdomen). ? Shortness of breath. ? Sweating.  Your vision changes.  You are bleeding.  You have a very bad headache.  You have neck pain or a stiff neck.  You have a fever. These symptoms may be an emergency. Do not wait to see if the symptoms will go away. Get medical help right away. Call your local emergency services (911 in the [place] Do not drive yourself to the hospital. Summary  Dizziness makes you feel unsteady or light-headed. You may feel like you are about to pass out (faint).  Drink enough fluid to keep your pee (urine)  clear or pale yellow. Do not drink alcohol.  Avoid making quick movements if you feel dizzy.  Watch your dizziness for any changes. This information is not intended to replace advice given to you by your health care provider. Make sure you discuss any questions you have with your health care provider. Document Released: 06/21/2011 Document Revised: 07/19/2016 Document Reviewed: 07/19/2016 Elsevier Interactive Patient Education  2017 [employer]

## 2017-05-29 NOTE — Telephone Encounter (Signed)
Wife is calling with concern about her husband's dizziness. He has spells where he gets dizzy and says the room is spinning. It last for about 15 minutes and then after it passed he is able to get up and is fine. They are supposed to leave on a cruise Friday and she would like him to be seen before then. He has history of heart surgery and has been seen at the ED twice for BP and vertigo test have all checked out normal. He also sees a urologist and is on testosterone injections and she is wondering if that could cause the dizziness. He was given an appointment for Friday- but she wants to know if there is any way it can be moved up. She also wants to know if they should be able to go if he can use Dramamine for motion sickness if needed.He is available early morning or after 2:00.

## 2017-05-29 NOTE — Progress Notes (Signed)
Subjective:    Patient ID: [name], male    DOB: 06-Jan-1946, 71 y.o.   MRN: [id number]  HPI  [name] is a 71 year old male who presents today for evaluation of dizziness that has been intermittent for 2 months. He reports that dizziness will last maybe 2 to 3 seconds then improve.  Associated nausea will occur with dizziness and he reports that he can also experience blurry vision but this does not always occur. He also reports fluctuations in his blood pressure have been previously noted.  He has been evaluated by cardiology on 03/20/17 for dizziness and balance issues. He was also evaluated 01/03/17 for a hospital follow up due to similar symptoms and near syncopal episode. He denies any chest pain/pressure with rest or exertion, palpitation, or SOB. He denies any syncope or near-syncope episodes.  He is leaving for a cruise in 3 days and reports that he was blowing his nose this morning and noted dizziness after blowing his nose that lasted for a "few seconds" then resolved spontaneously. No associated nausea or visual changes today. Associated nasal congestion and rhinitis have been present. Presyncope/syncope associated: No Evaluation of provoking factors that can cause symptoms below: Change in head position: No Ear pressure: No Coughing/sneezing: No Valsalva: No Occur with standing/getting out of bed: intermittently Lying down/rolling over in bed/bending neck: No Hearing loss: No No melena, hematochezia, hematuria, or epistaxis  Denies N/V, HA, numbness, weakness, abnormal coordination/gait, or rapid irregular heartbeat  He states that he does not drink enough water due to urinary history. He states that his urine is not pale yellow or clear. He also has not worn compression stockings as recommended.   He has decreased his gabapentin as recommended by cardiology to 200 mg in the evening.  He also reports new glasses this year and states that removing them if dizzy, he will  notice improvement.   Review of Systems  Constitutional: Negative for chills, fatigue and fever.  HENT: Positive for congestion, postnasal drip and rhinorrhea.   Respiratory: Negative for cough, shortness of breath and wheezing.   Cardiovascular: Negative for chest pain and palpitations.  Gastrointestinal: Negative for diarrhea, nausea and vomiting.  Neurological: Positive for dizziness and light-headedness. Negative for weakness and headaches.  Psychiatric/Behavioral:       Denies depressed or anxious mood   Past Medical History:  Diagnosis Date  . Arthritis   . CAD, multiple vessel 03/2105   LM 65%, mLAD 70%, 90% & 80%, o-pCx 75%, mCx 95% & d Cx 70%, OM1 60%, mRCA 90% with 100% dRCA. R-R collaterals  . Chronic distal aortic occlusion (HCC)    By report  . Chronic kidney disease   . COPD (chronic obstructive pulmonary disease) (HCC)   . Depression   . Enlarged prostate   . Erectile dysfunction   . Essential hypertension   . History of kidney stones    multiple lithotripsies  . Hyperlipidemia LDL goal <70   . Hypogonadism in male    Previously on testosterone injections, but has not yet restarted until further testing  . Muscle pain   . PAF (paroxysmal atrial fibrillation) (HCC) 03/2015   Noted per-cath & post-op CABG.  Dr. [name], cardiology  . Peripheral vascular disease (HCC)    Bilateral ABIs 0.8  . Renal stones 03/2016  . S/P CABG x 5 04/04/2015   LIMA to LAD, SVG to DIAG, SVG to OM, seqSVG -PDA- dCx  . SOB (shortness of breath)   .  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 Weakness    weakness bilateral legs, s/p vein harvesting for CABPG     Social History   Socioeconomic History  . Marital status: Married    Spouse name: Not on file  . Number of children: 2  . Years of education: 7914  . Highest education level: Not on file  Social Needs  . Financial resource strain: Not on file  . Food insecurity - worry: Not on file   . Food insecurity - inability: Not on file  . Transportation needs - medical: Not on file  . Transportation needs - non-medical: Not on file  Occupational History  . Not on file  Tobacco Use  . Smoking status: Former Smoker    Packs/day: 2.00    Years: 40.00    Pack years: 80.00    Types: Cigarettes    Last attempt to quit: 10/17/1992    Years since quitting: 24.6  . Smokeless tobacco: Never Used  . Tobacco comment: quit 1994  Substance and Sexual Activity  . Alcohol use: Yes    Alcohol/week: 0.6 oz    Types: 1 Standard drinks or equivalent per week    Comment: seldom - vodka or wine  . Drug use: No  . Sexual activity: No  Other Topics Concern  . Not on file  Social History Narrative   [place] in [place]. No pets   Two children.      Former smoker. Quit in 1994. Prior to quitting was unable to walk without exertional dyspnea.      Work - Retired. 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      Hobbies - reading, drives Corvette, chuch    Past Surgical History:  Procedure Laterality Date  . BLEPHAROPLASTY    . CARDIAC CATHETERIZATION  1992  . Carotid Dopplers  12/30/2016   Patent vertebrals. Mild bilateral carotid plaque, <50% stenosis bilaterally.  . CHOLECYSTECTOMY  2000   also had revision of hernia repair  . CYSTECTOMY     face and scrotum  . Lexiscan Myoview  June 2010   [facility] due to inability to reach target heart rate) --the study was normal with no evidence of ischemia and normal LV function. EF 7  . LITHOTRIPSY    . TRANSTHORACIC ECHOCARDIOGRAM  12/30/2016   EF 60-65%. GR 1 DD. Mildly dilated ascending aorta. Otherwise essentially normal with no valve lesions.  Marland Kitchen. VASECTOMY  1975  . VENTRAL HERNIA REPAIR  1994   complicated by infection    Family History  Problem Relation Age of Onset  . Coronary artery disease Father   . Heart disease Father   . Stroke  Mother   . Hearing loss Mother   . Stroke Sister   . Hearing loss Maternal Grandmother   . Prostate cancer Neg Hx   . Kidney disease Neg Hx   . Other Neg Hx        hypogonadism    No Known Allergies  Current Outpatient Medications on File Prior to Visit  Medication Sig Dispense Refill  . acetaminophen (TYLENOL) 650 MG CR tablet Take 650 mg by mouth every 8 (eight) hours as needed for pain. Reported on 01/03/2016    . albuterol (PROVENTIL HFA;VENTOLIN HFA) 108 (90 Base) MCG/ACT inhaler Inhale 2 puffs into the lungs every 4 (four) hours as needed for wheezing  or shortness of breath. (Patient not taking: Reported on 05/29/2017) 1 Inhaler 6  . atorvastatin (LIPITOR) 40 MG tablet Take 1 tablet (40 mg total) by mouth daily. (Patient not taking: Reported on 05/29/2017) 90 tablet 3  . clopidogrel (PLAVIX) 75 MG tablet Take 1 tablet (75 mg total) by mouth daily. (Patient not taking: Reported on 05/29/2017) 90 tablet 3  . diphenhydramine-acetaminophen (TYLENOL PM) 25-500 MG TABS tablet Take 1 tablet by mouth at bedtime as needed (sleep).    . finasteride (PROSCAR) 5 MG tablet TAKE 1 TABLET EVERY DAY (Patient not taking: Reported on 05/29/2017) 90 tablet 1  . fluticasone (FLONASE) 50 MCG/ACT nasal spray Place 2 sprays into both nostrils daily. (Patient not taking: Reported on 05/29/2017) 16 g 2  . gabapentin (NEURONTIN) 100 MG capsule TAKE ONE CAPSULE BY MOUTH TWICE A DAY (Patient not taking: Reported on 05/29/2017) 180 capsule 1  . metoprolol tartrate (LOPRESSOR) 25 MG tablet Take 0.5 tablets (12.5 mg total) by mouth 2 (two) times daily. (Patient not taking: Reported on 05/29/2017) 90 tablet 3  . NITROSTAT 0.4 MG SL tablet PLACE 1 TABLET (0.4 MG TOTAL) UNDER THE TONGUE EVERY 5 (FIVE) MINUTES AS NEEDED FOR CHEST PAIN. (Patient not taking: Reported on 05/29/2017) 25 tablet 5  . tamsulosin (FLOMAX) 0.4 MG CAPS capsule Take 0.4 mg by mouth daily.     Marland Kitchen. testosterone cypionate (DEPOTESTOSTERONE CYPIONATE)  200 MG/ML injection Inject 100 mg into the muscle every 14 (fourteen) days.     Marland Kitchen. triamcinolone (KENALOG) 0.025 % cream APPLY A SMALL DAB TO PENILE LESION ONCE DAILY  1   No current facility-administered medications on file prior to visit.     BP 120/74   Pulse 80   Temp 97.6 F (36.4 C) (Oral)   Wt 211 lb 3.2 oz (95.8 kg)   SpO2 97%   BMI 31.19 kg/m        Objective:   Physical Exam  Constitutional: He is oriented to person, place, and time. He appears well-developed and well-nourished.  Eyes: Pupils are equal, round, and reactive to light. No scleral icterus.  Neck: Neck supple.  Cardiovascular: Normal rate, regular rhythm and intact distal pulses. Exam reveals no gallop and no friction rub.  No murmur heard. Pulmonary/Chest: Effort normal and breath sounds normal. He has no wheezes. He has no rales.  Abdominal: Soft. Bowel sounds are normal. There is no tenderness. There is no rebound.  Musculoskeletal: He exhibits no edema.  Lymphadenopathy:    He has no cervical adenopathy.  Neurological: He is alert and oriented to person, place, and time.  II-Visual fields grossly intact. III/IV/VI-Extraocular movements intact. Pupils reactive bilaterally. V/VII-Smile symmetric, equal eyebrow raise, facial sensation intact VIII- Hearing grossly intact XI-bilateral shoulder shrug XII-midline tongue extension Motor: 5/5 bilaterally with normal tone and bulk Cerebellar: Normal finger-to-nose and normal heel-to-shin test.  Romberg negative Ambulates with a coordinated gait   Skin: Skin is warm and dry. No rash noted.  Psychiatric: He has a normal mood and affect. His behavior is normal. Judgment and thought content normal.        Assessment & Plan:  1. Dizziness of unknown cause Orthostatic BPs normal and  did not indicate source of dizziness. Recent decrease in gabapentin has not improved symptom that still occurs intermittently. He denies any adverse effect from decreasing  gabapentin dose.  It is unclear why episodes of dizziness occur.  He sought care today as a precaution before going on his cruise. Concern that he  is not adequately hydrated as he self reports that he has not focused on drinking water. He also takes Proscar and Flomax and cardiologist noted that these medications may potentially be a source of dizziness. Further information noted in his history of labile up and down BPs which resulted in a low-dose of metoprolol.   He has not worn compression stockings nor focused on hydration since visit with cardiologist. He also reports feeling well otherwise and is looking forward to his upcoming cruise.  We discussed the importance of hydration and he has agreed to resume compression stockings. We further discussed slowly changing positions and sitting on the edge of the bed before getting up in the morning. Also advised him to rock his feet from toe to heel several times before getting up from a sitting position.  Finally, advised him to monitor his BP daily, document readings, and follow up with his PCP for further evaluation upon return from trip. His wife is a Engineer, civil (consulting) and he stated that he has agreed to do this.  Reinforced prior recommendation of using a walking cane.  2. Vision changes No vision changes noted today. Neuro exam normal today; no vision changes noted at this time. As he reports removing his glasses improves this symptom; advised him to follow up with eye provider for further evaluation of vision and eye glasses as he had a new prescription this year.  Follow up with PCP after returning from trip and cardiology in December as scheduled.  Roddie Mc, FNP-C

## 2017-05-29 NOTE — Telephone Encounter (Signed)
Moved patient up to earlier appt

## 2017-05-30 DIAGNOSIS — E291 Testicular hypofunction: Secondary | ICD-10-CM | POA: Diagnosis not present

## 2017-05-31 ENCOUNTER — Ambulatory Visit: Payer: Medicare Other | Admitting: Internal Medicine

## 2017-06-17 ENCOUNTER — Telehealth: Payer: Self-pay | Admitting: Family Medicine

## 2017-06-17 MED ORDER — OSELTAMIVIR PHOSPHATE 75 MG PO CAPS: 75.0000 mg | ORAL_CAPSULE | Freq: Every day | ORAL | 0 refills | Status: DC

## 2017-06-17 NOTE — Telephone Encounter (Signed)
Left patient a message letting him know that medication was sent to pharmacy

## 2017-06-17 NOTE — Telephone Encounter (Signed)
Please advise 

## 2017-06-17 NOTE — Telephone Encounter (Signed)
Tamiflu sent to pharmacy for prophylaxis. Please inform patient.

## 2017-06-17 NOTE — Telephone Encounter (Signed)
Pt wife checked out and wanted to check if Dr Birdie SonsSonnenberg received a note regarding [name] needing medication due to having the same symptoms as wife. Please advise?  Call wife @ [phone] or pt @ [phone]. Thank you!

## 2017-06-19 DIAGNOSIS — E291 Testicular hypofunction: Secondary | ICD-10-CM | POA: Diagnosis not present

## 2017-06-24 ENCOUNTER — Ambulatory Visit: Payer: Medicare Other | Admitting: Cardiology

## 2017-07-02 ENCOUNTER — Ambulatory Visit (INDEPENDENT_AMBULATORY_CARE_PROVIDER_SITE_OTHER): Payer: Medicare Other | Admitting: Cardiology

## 2017-07-02 ENCOUNTER — Encounter: Payer: Self-pay | Admitting: Cardiology

## 2017-07-02 VITALS — BP 103/58 | HR 80 | Ht 66.0 in | Wt 210.6 lb

## 2017-07-02 DIAGNOSIS — R42 Dizziness and giddiness: Secondary | ICD-10-CM

## 2017-07-02 DIAGNOSIS — R55 Syncope and collapse: Secondary | ICD-10-CM

## 2017-07-02 DIAGNOSIS — I480 Paroxysmal atrial fibrillation: Secondary | ICD-10-CM | POA: Diagnosis not present

## 2017-07-02 DIAGNOSIS — I739 Peripheral vascular disease, unspecified: Secondary | ICD-10-CM

## 2017-07-02 DIAGNOSIS — I2510 Atherosclerotic heart disease of native coronary artery without angina pectoris: Secondary | ICD-10-CM

## 2017-07-02 DIAGNOSIS — I779 Disorder of arteries and arterioles, unspecified: Secondary | ICD-10-CM

## 2017-07-02 DIAGNOSIS — I25119 Atherosclerotic heart disease of native coronary artery with unspecified angina pectoris: Secondary | ICD-10-CM | POA: Diagnosis not present

## 2017-07-02 DIAGNOSIS — R5382 Chronic fatigue, unspecified: Secondary | ICD-10-CM | POA: Diagnosis not present

## 2017-07-02 DIAGNOSIS — I951 Orthostatic hypotension: Secondary | ICD-10-CM

## 2017-07-02 DIAGNOSIS — E785 Hyperlipidemia, unspecified: Secondary | ICD-10-CM

## 2017-07-02 DIAGNOSIS — I209 Angina pectoris, unspecified: Secondary | ICD-10-CM

## 2017-07-02 NOTE — Patient Instructions (Signed)
MEDICATION INSTRUCTION   TRY TO TAKE METOPROLOL  TWICE A DAY.     LABS IN 6 MONTHS  BEFORE NEXT APPOINTMENT WILL MAIL LAB SLIP CMP LIPID   Your physician wants you to follow-up in  6 MONTH VISIT DR HARDING. You will receive a reminder letter in the mail two months in advance. If you don't receive a letter, please call our office to schedule the follow-up appointment.   If you need a refill on your cardiac medications before your next appointment, please call your pharmacy.

## 2017-07-02 NOTE — Progress Notes (Signed)
PCP: Glori LuisSonnenberg, Eric G, MD  Clinic Note: Chief Complaint  Patient presents with  . Follow-up    CAD-CABG    HPI: [name] Clary is a 71 y.o. male with a PMH below who presents today for Three-month follow-up with multiple complaints noted above. Mostly noting dizziness and nausea as well as erratic blood pressures. .He has a history of CAD noticed for progressive angina and referred for CABG for multivessel disease. He also has long-standing known occlusion of the distal aorta. He had atrial fibrillation in the setting of his progressive angina and pre-/peri-CABG timeframe.  [name] Hohler was last seen in Sept 2018 -- follow-up or episodic dizziness, fatigue & edema. Also noted balance issues.  Several emails back-and-forth.  Also generalized fatigue and lack of energy.  Balance issues.  Recent Hospitalizations: No hospitalizationsSince last visit  Studies Personally Reviewed - (if available, images/films reviewed: From Epic Chart or Care Everywhere)  No new studies  Interval History: [name] presents today actually feeling much better.  He says his dizziness is pretty much all but gone.  His energy level actually seems to be improving as well.  He attributes this to his testosterone shots indicating that having his testosterone levels normalized seems to be helping.  He says the fatigue is better and he is not had any significant dizzy spells enough to make him feel like he is no passout.  He still has some positional dizziness, but nothing like he was having before.  He has been able to go on trips and do yard work etc..  He does pretty much things that he wants to do just has to be careful.  One annoying symptom that he continues to have is is clicking twinging chest discomfort that he has from his sternal nonunion.  It really has to do with certain motions or lying on his side.  It just catches him and he hears a click.  It is immediately painful and then resolves.  He has been seen by  vascular surgery.  In fact he has ABIs pending that he never went through with.  He notes that his left arm gets cold sometimes more so than his right.  Thankfully, however he really has not had any near syncopal episodes.  The dizziness is notably improved.  He is able to take a shower without issues now. Furthermore, from a cardiac standpoint he seems to be doing quite well without any anginal chest tightness or pressure with rest or exertion.  His energy level is improved and he denies any minimal ankle edema but that stable.  No PND or orthopnea.  Still has truncal obesity with difficulty losing weight. His blood pressure seems to have stabilized since we adjusted his medications.  No rapid irregular heartbeats with only rare palpitations.  No melena, hematochezia, hematuria, or epstaxis.  He and his wife have been on a couple cruises off and on throughout the course of the year.  He just recently got back from one that was quite a lot of fun.  He was able to do pretty much what he wanted to do   ROS: A comprehensive was performed. Review of Systems  Constitutional: Malaise/fatigue: Almost 95% better.  HENT: Positive for congestion. Negative for nosebleeds and sinus pain.   Respiratory: Positive for shortness of breath (On occasion with prolonged walking). Negative for cough.   Gastrointestinal: Negative for constipation and nausea (Without abdominal pain).  Genitourinary: Positive for frequency. Negative for dysuria and hematuria.  Musculoskeletal: Positive for  joint pain. Negative for falls.       Sternal nonunion pain  Neurological: Positive for headaches (Occasional). Negative for dizziness (Minimal), focal weakness, seizures, loss of consciousness and weakness (Globalized weakness, notably in the legs).  Endo/Heme/Allergies: Negative for environmental allergies. Bruises/bleeds easily (Also notes very easy skin care).  Psychiatric/Behavioral: Positive for memory loss.       Not  sleeping well  All other systems reviewed and are negative.  I have reviewed and (if needed) personally updated the patient's problem list, medications, allergies, past medical and surgical history, social and family history.   Past Medical History:  Diagnosis Date  . Arthritis   . CAD, multiple vessel 03/2105   LM 65%, mLAD 70%, 90% & 80%, o-pCx 75%, mCx 95% & d Cx 70%, OM1 60%, mRCA 90% with 100% dRCA. R-R collaterals  . Chronic distal aortic occlusion (HCC)    By report  . Chronic kidney disease   . COPD (chronic obstructive pulmonary disease) (HCC)   . Depression   . Enlarged prostate   . Erectile dysfunction   . Essential hypertension   . History of kidney stones    multiple lithotripsies  . Hyperlipidemia LDL goal <70   . Hypogonadism in male    Previously on testosterone injections, but has not yet restarted until further testing  . Muscle pain   . PAF (paroxysmal atrial fibrillation) (HCC) 03/2015   Noted per-cath & post-op CABG.  Dr. [name], cardiology  . Peripheral vascular disease (HCC)    Bilateral ABIs 0.8  . Renal stones 03/2016  . S/P CABG x 5 04/04/2015   LIMA to LAD, SVG to DIAG, SVG to OM, seqSVG -PDA- dCx  . SOB (shortness of breath)   . 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 Weakness    weakness bilateral legs, s/p vein harvesting for CABPG    Past Surgical History:  Procedure Laterality Date  . BLEPHAROPLASTY    . CARDIAC CATHETERIZATION  1992  . CARDIAC CATHETERIZATION N/A 03/31/2015   Procedure: Left Heart Cath and Coronary Angiography;  Surgeon: Marykay Lex, MD;  Location: [facility] INVASIVE CV LAB: LM 65%, mLAD 70%, 90% & 80%, o-pCx 75%, mCx 95% & d Cx 70%, OM1 60%, mRCA 90% with 100% dRCA. R-R collaterals --> CABG referral. Also noted to be in Afib.  . Carotid Dopplers  12/30/2016   Patent vertebrals. Mild bilateral carotid plaque, <50% stenosis bilaterally.  . CHOLECYSTECTOMY  2000   also had revision of  hernia repair  . CORONARY ARTERY BYPASS GRAFT N/A 04/04/2015   Procedure: CORONARY ARTERY BYPASS GRAFTING (CABG);  Surgeon: [name], MD;  Location: [facility] OR;  Service: Open Heart Surgery;  Laterality: N/A;  . CYSTECTOMY     face and scrotum  . CYSTOSCOPY WITH INSERTION OF UROLIFT N/A 11/12/2016   Procedure: CYSTOSCOPY WITH INSERTION OF UROLIFT x4;  Surgeon: Jethro Bolus, MD;  Location: [facility] ORS;  Service: Urology;  Laterality: N/A;  . CYSTOSCOPY WITH RETROGRADE PYELOGRAM, URETEROSCOPY AND STENT PLACEMENT Right 06/26/2016   Procedure: CYSTOSCOPY WITH RIGHT RETROGRADE PYELOGRAM, RIGHT URETEROSCOPY , HOLMIUM LASER, BASKET EXTRACTION AND STENT PLACEMENT;  Surgeon: Jethro Bolus, MD;  Location: [facility] ORS;  Service: Urology;  Laterality: Right;  . Eugenie Birks Myoview  June 2010   converted to [facility] from treadmill due to inability to reach target heart rate) --the study was normal with no evidence of ischemia and normal LV function. EF 7  . LITHOTRIPSY    .  TEE WITHOUT CARDIOVERSION N/A 04/04/2015   Procedure: TRANSESOPHAGEAL ECHOCARDIOGRAM (TEE);  Surgeon: Delight OvensEdward B Gerhardt, MD;  [facility];  Service: Open Heart Surgery;  Laterality: N/A;  . TRANSTHORACIC ECHOCARDIOGRAM  12/30/2016   EF 60-65%. GR 1 DD. Mildly dilated ascending aorta. Otherwise essentially normal with no valve lesions.  Marland Kitchen. VASECTOMY  1975  . VENTRAL HERNIA REPAIR  1994   complicated by infection    Current Meds  Medication Sig  . acetaminophen (TYLENOL) 650 MG CR tablet Take 650 mg by mouth every 8 (eight) hours as needed for pain. Reported on 01/03/2016  . atorvastatin (LIPITOR) 40 MG tablet Take 1 tablet (40 mg total) by mouth daily.  . clopidogrel (PLAVIX) 75 MG tablet Take 1 tablet (75 mg total) by mouth daily.  . diphenhydramine-acetaminophen (TYLENOL PM) 25-500 MG TABS tablet Take 1 tablet by mouth at bedtime as needed (sleep).  . finasteride (PROSCAR) 5 MG tablet TAKE 1 TABLET EVERY DAY  . gabapentin  (NEURONTIN) 100 MG capsule TAKE ONE CAPSULE BY MOUTH TWICE A DAY  . metoprolol tartrate (LOPRESSOR) 25 MG tablet Take 0.5 tablets (12.5 mg total) by mouth 2 (two) times daily.  Marland Kitchen. NITROSTAT 0.4 MG SL tablet PLACE 1 TABLET (0.4 MG TOTAL) UNDER THE TONGUE EVERY 5 (FIVE) MINUTES AS NEEDED FOR CHEST PAIN.  . tamsulosin (FLOMAX) 0.4 MG CAPS capsule Take 0.4 mg by mouth daily.   Marland Kitchen. testosterone cypionate (DEPOTESTOSTERONE CYPIONATE) 200 MG/ML injection Inject 100 mg into the muscle every 14 (fourteen) days.     No Known Allergies  Social History   Socioeconomic History  . Marital status: Married    Spouse name: None  . Number of children: 2  . Years of education: 7514  . Highest education level: None  Social Needs  . Financial resource strain: None  . Food insecurity - worry: None  . Food insecurity - inability: None  . Transportation needs - medical: None  . Transportation needs - non-medical: None  Occupational History  . None  Tobacco Use  . Smoking status: Former Smoker    Packs/day: 2.00    Years: 40.00    Pack years: 80.00    Types: Cigarettes    Last attempt to quit: 10/17/1992    Years since quitting: 24.7  . Smokeless tobacco: Never Used  . Tobacco comment: quit 1994  Substance and Sexual Activity  . Alcohol use: Yes    Alcohol/week: 0.6 oz    Types: 1 Standard drinks or equivalent per week    Comment: seldom - vodka or wine  . Drug use: No  . Sexual activity: No  Other Topics Concern  . None  Social History Narrative   [place] No pets   Two children.      Former smoker. Quit in 1994. Prior to quitting was unable to walk without exertional dyspnea.      Work - Retired. 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      Hobbies - reading, drives Corvette, chuch    family history includes Coronary artery disease in his father; Hearing loss in his maternal grandmother and mother; Heart disease  in his father; Stroke in his mother and sister.  Wt Readings from Last 3 Encounters:  07/02/17 210 lb 9.6 oz (95.5 kg)  05/29/17 211 lb 3.2 oz (95.8 kg)  04/08/17 212 lb 12.8 oz (96.5 kg)    PHYSICAL EXAM BP (!) 103/58  Pulse 80   Ht 5\' 6"  (1.676 m)   Wt 210 lb 9.6 oz (95.5 kg)   BMI 33.99 kg/m  Physical Exam  Constitutional: He is oriented to person, place, and time. He appears well-developed and well-nourished. No distress.  He appears older than his stated age. Appears to be declining in functional status.  HENT:  Head: Normocephalic and atraumatic.  Eyes: EOM are normal.  Neck: Normal range of motion. Neck supple. JVD present.  Cardiovascular: Normal rate, regular rhythm, normal heart sounds and intact distal pulses. Exam reveals no gallop and no friction rub.  No murmur heard. Pulmonary/Chest: Effort normal and breath sounds normal. No respiratory distress. He has no wheezes. He has no rales.  Palpable mobile sternum  Abdominal: Soft. Bowel sounds are normal. He exhibits no distension. There is no tenderness. There is no rebound.  Truncal obesity  Musculoskeletal: Normal range of motion. He exhibits edema (Trivial edema).  Neurological: He is alert and oriented to person, place, and time.  Minimal swaying with Romberg.  Skin: Skin is warm and dry. No rash noted. No erythema.  Psychiatric: His behavior is normal. Judgment and thought content normal.  Somewhat flat affect but normal mood  Nursing note and vitals reviewed.   Adult ECG Report - n/a   Other studies Reviewed: Additional studies/ records that were reviewed today include:  Recent Labs:   Lab Results  Component Value Date   CREATININE 1.20 12/29/2016   BUN 22 (H) 12/29/2016   NA 139 12/29/2016   K 3.9 12/29/2016   CL 108 12/29/2016   CO2 25 12/29/2016   Lab Results  Component Value Date   CHOL 155 10/30/2016   HDL 36 (L) 10/30/2016   LDLCALC 63 10/30/2016   TRIG 282 (H) 10/30/2016   CHOLHDL 4.3  10/30/2016     ASSESSMENT / PLAN: Problem List Items Addressed This Visit    CAD, multiple vessel (Chronic)    Status post CABG.  On stable meds as noted.      Chronic fatigue    Seems to be improving with testosterone supplementation.  At this point I think quality of life warrants supplementation despite potential concerns of correlation between testosterone supplementation and worsening atherosclerotic disease.      Coronary artery disease involving native coronary artery with angina pectoris (HCC) - Primary (Chronic)    No anginal pain status post CABG.  Now more than 2 years out. Energy level seems improved.  He is not having his pre-operative anginal symptoms. He is on stable dose of atorvastatin (although we may need to monitor if he continues to have memory issues).  He remains on Plavix without aspirin (as much for PAD as her CAD).  He is on very low-dose metoprolol.  I would like for him to try to take this twice daily.  If not we can try again to go to 12.5 succinate.  Certainly does not have enough blood pressure room to add any other medications. Plan Myoview in 2 years unless symptoms recur or worsen       Relevant Orders   Lipid panel   Comprehensive metabolic panel   Dizziness of unknown cause (Chronic)    Seems to improved.  Probably was related to hypotension, but I think with the reduced dose of gabapentin, his symptoms have improved as well.  Agree with his PCP about Flomax and Proscar is also potential culprits.      Dyslipidemia, goal LDL below 70 (Chronic)    On  stable dose of atorvastatin.  Due for labs to be checked.  Last set of labs were in April.  We will put an order for labs to be rechecked in April 2019.  Last labs were great.      Relevant Orders   Lipid panel   Comprehensive metabolic panel   Near syncope    No further episodes.  Maintain adequate hydration.  Minimize antihypertensives.      Orthostatic hypotension    Ensure adequate  hydration.  He is wearing compression stockings which helps his edema.  Also helps his orthostatic symptoms. Okay to hold metoprolol if necessary.      PAD (peripheral artery disease) (HCC) (Chronic)    Now being followed by a vascular surgeon --Dopplers being followed.  He had a history of Leriche syndrome with occluded aorta.  He does not really seem to be that bothered by claudication.      PAF (paroxysmal atrial fibrillation) (HCC) (Chronic)    No recurrence.  On low-dose beta-blocker in case he has recurrence. In the absence of any recurrent episodes, I would not place him on full anti-coagulation beyond Plavix.  This seem to be all perioperative.         Current medicines are reviewed at length with the patient today. (+/- concerns) not sure of med Rxn. The following changes have been made: n/a  Patient Instructions  MEDICATION INSTRUCTION   TRY TO TAKE METOPROLOL  TWICE A DAY.     LABS IN 6 MONTHS  BEFORE NEXT APPOINTMENT WILL MAIL LAB SLIP CMP LIPID   Your physician wants you to follow-up in  6 MONTH VISIT DR Emilian Stawicki. You will receive a reminder letter in the mail two months in advance. If you don't receive a letter, please call our office to schedule the follow-up appointment.   If you need a refill on your cardiac medications before your next appointment, please call your pharmacy.    Studies Ordered:   Orders Placed This Encounter  Procedures  . Lipid panel  . Comprehensive metabolic panel      Bryan Lemma, M.D., M.S. Interventional Cardiologist   Pager # [phone] Phone # [phone] [address] [place], [place] [address]

## 2017-07-03 DIAGNOSIS — E291 Testicular hypofunction: Secondary | ICD-10-CM | POA: Diagnosis not present

## 2017-07-05 ENCOUNTER — Encounter: Payer: Self-pay | Admitting: Cardiology

## 2017-07-05 NOTE — Assessment & Plan Note (Addendum)
Now being followed by a vascular surgeon --Dopplers being followed.  He had a history of Leriche syndrome with occluded aorta.  He does not really seem to be that bothered by claudication.

## 2017-07-05 NOTE — Assessment & Plan Note (Addendum)
No recurrence.  On low-dose beta-blocker in case he has recurrence. In the absence of any recurrent episodes, I would not place him on full anti-coagulation beyond Plavix.  This seem to be all perioperative.

## 2017-07-05 NOTE — Assessment & Plan Note (Signed)
Status post CABG.  On stable meds as noted.

## 2017-07-05 NOTE — Assessment & Plan Note (Signed)
On stable dose of atorvastatin.  Due for labs to be checked.  Last set of labs were in April.  We will put an order for labs to be rechecked in April 2019.  Last labs were great.

## 2017-07-05 NOTE — Assessment & Plan Note (Signed)
No further episodes.  Maintain adequate hydration.  Minimize antihypertensives.

## 2017-07-05 NOTE — Assessment & Plan Note (Signed)
Ensure adequate hydration.  He is wearing compression stockings which helps his edema.  Also helps his orthostatic symptoms. Okay to hold metoprolol if necessary.

## 2017-07-05 NOTE — Assessment & Plan Note (Signed)
Seems to improved.  Probably was related to hypotension, but I think with the reduced dose of gabapentin, his symptoms have improved as well.  Agree with his PCP about Flomax and Proscar is also potential culprits.

## 2017-07-05 NOTE — Assessment & Plan Note (Signed)
No anginal pain status post CABG.  Now more than 2 years out. Energy level seems improved.  He is not having his pre-operative anginal symptoms. He is on stable dose of atorvastatin (although we may need to monitor if he continues to have memory issues).  He remains on Plavix without aspirin (as much for PAD as her CAD).  He is on very low-dose metoprolol.  I would like for him to try to take this twice daily.  If not we can try again to go to 12.5 succinate.  Certainly does not have enough blood pressure room to add any other medications. Plan Myoview in 2 years unless symptoms recur or worsen

## 2017-07-05 NOTE — Assessment & Plan Note (Deleted)
No recurrence.  On low-dose beta-blocker in case he has recurrence. In the absence of any recurrent episodes, I would not place him on full anti-correlation.  This seem to be all perioperative.

## 2017-07-05 NOTE — Assessment & Plan Note (Signed)
Seems to be improving with testosterone supplementation.  At this point I think quality of life warrants supplementation despite potential concerns of correlation between testosterone supplementation and worsening atherosclerotic disease.

## 2017-07-11 ENCOUNTER — Other Ambulatory Visit: Payer: Self-pay | Admitting: Family Medicine

## 2017-07-11 NOTE — Telephone Encounter (Signed)
Refill done.  

## 2017-07-18 DIAGNOSIS — E291 Testicular hypofunction: Secondary | ICD-10-CM | POA: Diagnosis not present

## 2017-07-23 ENCOUNTER — Encounter: Payer: Self-pay | Admitting: Family Medicine

## 2017-07-23 ENCOUNTER — Ambulatory Visit (INDEPENDENT_AMBULATORY_CARE_PROVIDER_SITE_OTHER): Payer: Medicare Other | Admitting: Family Medicine

## 2017-07-23 ENCOUNTER — Other Ambulatory Visit: Payer: Self-pay

## 2017-07-23 ENCOUNTER — Ambulatory Visit: Payer: Medicare Other | Admitting: Family Medicine

## 2017-07-23 VITALS — BP 118/62 | HR 83 | Temp 98.1°F | Wt 218.8 lb

## 2017-07-23 DIAGNOSIS — J432 Centrilobular emphysema: Secondary | ICD-10-CM

## 2017-07-23 DIAGNOSIS — E291 Testicular hypofunction: Secondary | ICD-10-CM | POA: Diagnosis not present

## 2017-07-23 DIAGNOSIS — M954 Acquired deformity of chest and rib: Secondary | ICD-10-CM

## 2017-07-23 DIAGNOSIS — Z5181 Encounter for therapeutic drug level monitoring: Secondary | ICD-10-CM | POA: Diagnosis not present

## 2017-07-23 DIAGNOSIS — I951 Orthostatic hypotension: Secondary | ICD-10-CM | POA: Diagnosis not present

## 2017-07-23 DIAGNOSIS — H539 Unspecified visual disturbance: Secondary | ICD-10-CM

## 2017-07-23 DIAGNOSIS — M545 Low back pain, unspecified: Secondary | ICD-10-CM | POA: Insufficient documentation

## 2017-07-23 DIAGNOSIS — R7303 Prediabetes: Secondary | ICD-10-CM

## 2017-07-23 LAB — COMPREHENSIVE METABOLIC PANEL
ALT: 19 U/L (ref 0–53)
AST: 17 U/L (ref 0–37)
Albumin: 3.6 g/dL (ref 3.5–5.2)
Alkaline Phosphatase: 59 U/L (ref 39–117)
BUN: 20 mg/dL (ref 6–23)
CO2: 22 mEq/L (ref 19–32)
Calcium: 8.7 mg/dL (ref 8.4–10.5)
Chloride: 105 mEq/L (ref 96–112)
Creatinine, Ser: 1.22 mg/dL (ref 0.40–1.50)
GFR: 61.88 mL/min (ref >60.00)
Glucose, Bld: 191 mg/dL — ABNORMAL HIGH (ref 70–99)
Potassium: 4.1 mEq/L (ref 3.5–5.1)
Sodium: 135 mEq/L (ref 135–145)
Total Bilirubin: 0.5 mg/dL (ref 0.2–1.2)
Total Protein: 6.1 g/dL (ref 6.0–8.3)

## 2017-07-23 LAB — CBC
HCT: 46.9 % (ref 39.0–52.0)
Hemoglobin: 15.4 g/dL (ref 13.0–17.0)
MCHC: 33 g/dL (ref 30.0–36.0)
MCV: 92.8 fl (ref 78.0–100.0)
Platelets: 173 10*3/uL (ref 150.0–400.0)
RBC: 5.05 Mil/uL (ref 4.22–5.81)
RDW: 13.6 % (ref 11.5–15.5)
WBC: 9.4 10*3/uL (ref 4.0–10.5)

## 2017-07-23 LAB — HEMOGLOBIN A1C: Hgb A1c MFr Bld: 6.8 % — ABNORMAL HIGH (ref 4.6–6.5)

## 2017-07-23 NOTE — Patient Instructions (Signed)
Nice to see you. Please stick with the gabapentin 100 mg twice daily. We will get you referred to ophthalmology. Please monitor your cough and if it does not continue to improve please let us know. Please monitor your back and if it worsens again please let us know. We will contact you with your lab results.

## 2017-07-23 NOTE — Assessment & Plan Note (Signed)
He will continue to see urology. 

## 2017-07-23 NOTE — Assessment & Plan Note (Signed)
Seems to be musculoskeletal.  He does have a history of nephrolithiasis though this does not seem consistent without any urinary symptoms.  It has resolved.  Will monitor for recurrence.  We will check renal function.

## 2017-07-23 NOTE — Assessment & Plan Note (Signed)
Intermittent vision issues.  No changes today.  Will refer to ophthalmology at his request.

## 2017-07-23 NOTE — Progress Notes (Signed)
[name], MD Phone: [phone]  [name] is a 72 y.o. male who presents today for follow-up.  Patient notes his lightheadedness is quite a bit better.  He did have some yesterday though he took 400 mg of gabapentin which had been tapered down to 100 mg twice daily as it was felt to be contributing to his lightheadedness.  He notes no lightheadedness today.  Patient does intermittently have a pinching sensation in his chest from his sternal malunion that is worse if he slouches.  He Notes No Chest Pressure.  Patient Does Report Intermittent Vision Blurriness particularly occurs when he gets frustrated.  He does note burning and itching in his eyes at times.  He has not seen his eye doctor in about a year.  He wants to see a new eye doctor.  He is getting testosterone supplementation from his urologist.  This was helping with his energy level although not quite as much recently.  He sees them in a week.  He did have some cough that was productive of clear mucus.  His wife was sick with the flu though he did not get as bad of symptoms.  His breathing has been stable.  No wheezing.  Coughing is improving.  Patient notes yesterday he was sitting down as though he started to have low back pain.  Felt like somebody punched him in his bilateral low back.  He tried to get comfortable.  He noted no radiation.  No numbness or weakness.  No loss of bowel or bladder function.  No saddle anesthesia.  No hematuria.  No dysuria.  This has gone away and has not recurred since yesterday.  Social History   Tobacco Use  Smoking Status Former Smoker  . Packs/day: 2.00  . Years: 40.00  . Pack years: 80.00  . Types: Cigarettes  . Last attempt to quit: 10/17/1992  . Years since quitting: 24.7  Smokeless Tobacco Never Used  Tobacco Comment   quit 1994     ROS see history of present illness  Objective  Physical Exam Vitals:   07/23/17 1401  BP: 118/62  Pulse: 83  Temp: 98.1 F (36.7 C)    SpO2: 96%    BP Readings from Last 3 Encounters:  07/23/17 118/62  07/02/17 (!) 103/58  05/29/17 120/74   Wt Readings from Last 3 Encounters:  07/23/17 218 lb 12.8 oz (99.2 kg)  07/02/17 210 lb 9.6 oz (95.5 kg)  05/29/17 211 lb 3.2 oz (95.8 kg)    Physical Exam  Constitutional: No distress.  Eyes: Conjunctivae and EOM are normal. Pupils are equal, round, and reactive to light.  Visual fields intact  Cardiovascular: Normal rate, regular rhythm and normal heart sounds.  Pulmonary/Chest: Effort normal and breath sounds normal.  Musculoskeletal: He exhibits no edema.  No midline spine tenderness, No midline spine step-off, muscular low back tenderness bilaterally in the lateral aspect of the low back, no CVA tenderness  Neurological: He is alert. Gait normal.  Skin: Skin is warm and dry. He is not diaphoretic.     Assessment/Plan: Please see individual problem list.  Orthostatic hypotension Much improved.  Suspect gabapentin was contributing to some degree.  Advised not to take the 400 mg dose again.  He will stick with 100 mg and let us know when he needs a refill.  Low back pain Seems to be musculoskeletal.  He does have a history of nephrolithiasis though this does not seem consistent without any urinary symptoms.  It has  resolved.  Will monitor for recurrence.  We will check renal function.  Chest wall deformity Advised to not slouch.  He has a sternal malunion and will likely continue to have pinching sensations.  Hypogonadism male He will continue to see urology.  COPD (chronic obstructive pulmonary disease) (HCC) Cough previously.  Likely viral.  Has improved.  He will monitor for recurrence.  Vision changes Intermittent vision issues.  No changes today.  Will refer to ophthalmology at his request.   [name] was seen today for follow-up.  Diagnoses and all orders for this visit:  Prediabetes -     HgB A1c -     Comp Met (CMET)  Vision changes -      Ambulatory referral to Ophthalmology  Medication monitoring encounter -     CBC  Orthostatic hypotension  Acute bilateral low back pain without sciatica  Chest wall deformity  Hypogonadism male  Centrilobular emphysema ([place])    Orders Placed This Encounter  Procedures  . HgB A1c  . CBC  . Comp Met (CMET)  . Ambulatory referral to Ophthalmology    Referral Priority:   Routine    Referral Type:   Consultation    Referral Reason:   Specialty Services Required    Requested Specialty:   Ophthalmology    Number of Visits Requested:   1    No orders of the defined types were placed in this encounter.    [name], MD [place]

## 2017-07-23 NOTE — Assessment & Plan Note (Signed)
Advised to not slouch.  He has a sternal malunion and will likely continue to have pinching sensations.

## 2017-07-23 NOTE — Assessment & Plan Note (Signed)
Cough previously.  Likely viral.  Has improved.  He will monitor for recurrence.

## 2017-07-23 NOTE — Assessment & Plan Note (Signed)
Much improved.  Suspect gabapentin was contributing to some degree.  Advised not to take the 400 mg dose again.  He will stick with 100 mg and let us know when he needs a refill.

## 2017-08-01 DIAGNOSIS — E291 Testicular hypofunction: Secondary | ICD-10-CM | POA: Diagnosis not present

## 2017-08-05 ENCOUNTER — Telehealth: Payer: Self-pay | Admitting: Family Medicine

## 2017-08-05 NOTE — Telephone Encounter (Signed)
Please advise 

## 2017-08-05 NOTE — Telephone Encounter (Signed)
Copied from CRM [phone]. Topic: Inquiry >> Aug 05, 2017  4:11 PM Windy KalataMichael, Erik Burkett L, NT wrote: patient is calling and requesting a call back from Dr. Birdie SonsSonnenberg nurse because he is on Mychart and he sees "can not be refilled" beside

## 2017-08-06 NOTE — Telephone Encounter (Signed)
Patient states mychart says no refills on medications, informed patient the pharmacy should have refills but if he needs any refills he can send us a message or call us to request refills.

## 2017-08-19 DIAGNOSIS — E291 Testicular hypofunction: Secondary | ICD-10-CM | POA: Diagnosis not present

## 2017-08-19 DIAGNOSIS — R350 Frequency of micturition: Secondary | ICD-10-CM | POA: Diagnosis not present

## 2017-08-19 DIAGNOSIS — N401 Enlarged prostate with lower urinary tract symptoms: Secondary | ICD-10-CM | POA: Diagnosis not present

## 2017-08-19 DIAGNOSIS — R351 Nocturia: Secondary | ICD-10-CM | POA: Diagnosis not present

## 2017-08-28 DIAGNOSIS — H04123 Dry eye syndrome of bilateral lacrimal glands: Secondary | ICD-10-CM | POA: Diagnosis not present

## 2017-09-03 DIAGNOSIS — E291 Testicular hypofunction: Secondary | ICD-10-CM | POA: Diagnosis not present

## 2017-09-12 ENCOUNTER — Other Ambulatory Visit: Payer: Self-pay | Admitting: Family Medicine

## 2017-09-13 ENCOUNTER — Telehealth: Payer: Self-pay | Admitting: Family Medicine

## 2017-09-13 DIAGNOSIS — N400 Enlarged prostate without lower urinary tract symptoms: Secondary | ICD-10-CM

## 2017-09-13 HISTORY — PX: OTHER SURGICAL HISTORY: SHX169

## 2017-09-13 NOTE — Telephone Encounter (Signed)
Copied from CRM [phone]. Topic: Quick Communication - Rx Refill/Question >> Sep 13, 2017  5:05 PM Floria RavelingStovall, Shana A wrote: Medication: finasteride (PROSCAR) 5 MG tablet [[id number]][[id number]]   Has the patient contacted their pharmacy? no   (Agent: If no, request that the patient contact the pharmacy for the refill.)   Preferred Pharmacy (with phone number or street name): [facility]    Agent: Please be advised that RX refills may take up to 3 business days. We ask that you follow-up with your pharmacy.

## 2017-09-15 DIAGNOSIS — R05 Cough: Secondary | ICD-10-CM | POA: Diagnosis not present

## 2017-09-15 DIAGNOSIS — R52 Pain, unspecified: Secondary | ICD-10-CM | POA: Diagnosis not present

## 2017-09-16 MED ORDER — FINASTERIDE 5 MG PO TABS: 5.0000 mg | ORAL_TABLET | Freq: Every day | ORAL | 1 refills | Status: DC

## 2017-09-16 NOTE — Telephone Encounter (Signed)
Sent to pharmacy 

## 2017-09-16 NOTE — Addendum Note (Signed)
Addended by: Inetta FermoHENDRICKS, Zimal Weisensel S on: 09/16/2017 09:36 AM   Modules accepted: Orders

## 2017-09-17 ENCOUNTER — Encounter: Payer: Self-pay | Admitting: Emergency Medicine

## 2017-09-17 ENCOUNTER — Emergency Department
Admission: EM | Admit: 2017-09-17 | Discharge: 2017-09-17 | Disposition: A | Discharge status: Home / Self Care | Payer: Medicare Other | Attending: Emergency Medicine | Admitting: Emergency Medicine

## 2017-09-17 ENCOUNTER — Emergency Department: Payer: Medicare Other

## 2017-09-17 DIAGNOSIS — R0781 Pleurodynia: Secondary | ICD-10-CM | POA: Diagnosis not present

## 2017-09-17 DIAGNOSIS — Z7902 Long term (current) use of antithrombotics/antiplatelets: Secondary | ICD-10-CM | POA: Diagnosis not present

## 2017-09-17 DIAGNOSIS — R1010 Upper abdominal pain, unspecified: Secondary | ICD-10-CM | POA: Diagnosis present

## 2017-09-17 DIAGNOSIS — I2510 Atherosclerotic heart disease of native coronary artery without angina pectoris: Secondary | ICD-10-CM | POA: Insufficient documentation

## 2017-09-17 DIAGNOSIS — Z951 Presence of aortocoronary bypass graft: Secondary | ICD-10-CM | POA: Insufficient documentation

## 2017-09-17 DIAGNOSIS — N23 Unspecified renal colic: Secondary | ICD-10-CM | POA: Diagnosis not present

## 2017-09-17 DIAGNOSIS — Z87891 Personal history of nicotine dependence: Secondary | ICD-10-CM | POA: Insufficient documentation

## 2017-09-17 DIAGNOSIS — Z79899 Other long term (current) drug therapy: Secondary | ICD-10-CM | POA: Diagnosis not present

## 2017-09-17 DIAGNOSIS — R079 Chest pain, unspecified: Secondary | ICD-10-CM | POA: Diagnosis not present

## 2017-09-17 DIAGNOSIS — I129 Hypertensive chronic kidney disease with stage 1 through stage 4 chronic kidney disease, or unspecified chronic kidney disease: Secondary | ICD-10-CM | POA: Diagnosis not present

## 2017-09-17 DIAGNOSIS — J449 Chronic obstructive pulmonary disease, unspecified: Secondary | ICD-10-CM | POA: Diagnosis not present

## 2017-09-17 DIAGNOSIS — N189 Chronic kidney disease, unspecified: Secondary | ICD-10-CM | POA: Insufficient documentation

## 2017-09-17 DIAGNOSIS — N132 Hydronephrosis with renal and ureteral calculous obstruction: Secondary | ICD-10-CM | POA: Diagnosis not present

## 2017-09-17 LAB — CBC
HCT: 49.6 % (ref 40.0–52.0)
Hemoglobin: 16.3 g/dL (ref 13.0–18.0)
MCH: 30.3 pg (ref 26.0–34.0)
MCHC: 32.9 g/dL (ref 32.0–36.0)
MCV: 92.2 fL (ref 80.0–100.0)
Platelets: 182 10*3/uL (ref 150–440)
RBC: 5.38 MIL/uL (ref 4.40–5.90)
RDW: 14.5 % (ref 11.5–14.5)
WBC: 9.6 10*3/uL (ref 3.8–10.6)

## 2017-09-17 LAB — BASIC METABOLIC PANEL
Anion gap: 7 (ref 5–15)
BUN: 20 mg/dL (ref 6–20)
CO2: 26 mmol/L (ref 22–32)
Calcium: 8.9 mg/dL (ref 8.9–10.3)
Chloride: 107 mmol/L (ref 101–111)
Creatinine, Ser: 1.23 mg/dL (ref 0.61–1.24)
GFR calc Af Amer: >60 mL/min (ref >60)
GFR calc non Af Amer: 56 mL/min — ABNORMAL LOW (ref >60)
Glucose, Bld: 93 mg/dL (ref 65–99)
Potassium: 4.3 mmol/L (ref 3.5–5.1)
Sodium: 140 mmol/L (ref 135–145)

## 2017-09-17 LAB — LIPASE, BLOOD: Lipase: 55 U/L — ABNORMAL HIGH (ref 11–51)

## 2017-09-17 LAB — HEPATIC FUNCTION PANEL
ALT: 23 U/L (ref 17–63)
AST: 21 U/L (ref 15–41)
Albumin: 3.8 g/dL (ref 3.5–5.0)
Alkaline Phosphatase: 60 U/L (ref 38–126)
Bilirubin, Direct: <0.1 mg/dL — ABNORMAL LOW (ref 0.1–0.5)
Total Bilirubin: 0.8 mg/dL (ref 0.3–1.2)
Total Protein: 6.8 g/dL (ref 6.5–8.1)

## 2017-09-17 LAB — TROPONIN I
Troponin I: <0.03 ng/mL (ref <0.03)
Troponin I: <0.03 ng/mL (ref <0.03)

## 2017-09-17 MED ORDER — HYDROMORPHONE HCL 1 MG/ML IJ SOLN
0.5000 mg | INTRAMUSCULAR | Status: AC
  Administered 2017-09-17: 0.5 mg via INTRAVENOUS
  Filled 2017-09-17: qty 1

## 2017-09-17 MED ORDER — ONDANSETRON HCL 4 MG/2ML IJ SOLN
4.0000 mg | Freq: Once | INTRAMUSCULAR | Status: AC
  Administered 2017-09-17: 4 mg via INTRAVENOUS

## 2017-09-17 MED ORDER — ONDANSETRON HCL 4 MG/2ML IJ SOLN
INTRAMUSCULAR | Status: AC
  Administered 2017-09-17: 4 mg via INTRAVENOUS
  Filled 2017-09-17: qty 2

## 2017-09-17 MED ORDER — IOPAMIDOL (ISOVUE-370) INJECTION 76%
100.0000 mL | Freq: Once | INTRAVENOUS | Status: AC | PRN
  Administered 2017-09-17: 100 mL via INTRAVENOUS

## 2017-09-17 MED ORDER — HYDROMORPHONE HCL 1 MG/ML IJ SOLN
0.5000 mg | Freq: Once | INTRAMUSCULAR | Status: AC
  Administered 2017-09-17: 0.5 mg via INTRAVENOUS

## 2017-09-17 MED ORDER — OXYCODONE-ACETAMINOPHEN 5-325 MG PO TABS: 1.0000 | ORAL_TABLET | Freq: Four times a day (QID) | ORAL | 0 refills | Status: DC | PRN

## 2017-09-17 MED ORDER — HYDROMORPHONE HCL 1 MG/ML IJ SOLN
INTRAMUSCULAR | Status: AC
  Administered 2017-09-17: 0.5 mg via INTRAVENOUS
  Filled 2017-09-17: qty 1

## 2017-09-17 MED ORDER — IOPAMIDOL (ISOVUE-300) INJECTION 61%
30.0000 mL | Freq: Once | INTRAVENOUS | Status: AC | PRN
  Administered 2017-09-17: 30 mL via ORAL

## 2017-09-17 NOTE — ED Triage Notes (Signed)
Pt reports was treated for an infection a few weeks ago and yesterday he started feeling weak and today he started having some right rib pain and sharpe pain in his chest. Pt reports has a cardiac history with CABG 2 years ago. Pt reports intermittent SOB.

## 2017-09-17 NOTE — Discharge Instructions (Signed)
please go to Dr. Sande BrothersWinters office, Dr.Breesleck will be able to see you tomorrow at 1245. please return here [facility] if you develop worse pain fever vomiting or feel sicker. [facility]. You can use the Percocet 1 or 2 pills 4 times a day for pain. Be careful the Percocet pain pills can make you sleepy and constipated. Do not drive on them because you may fall.

## 2017-09-17 NOTE — ED Triage Notes (Signed)
Pt was prescribed Z-pack Sunday, felt better and then bad today.

## 2017-09-17 NOTE — ED Provider Notes (Signed)
[facility] Emergency Department Provider Note   ____________________________________________   First MD Initiated Contact with Patient 09/17/17 1135     (approximate)  I have reviewed the triage vital signs and the nursing notes.   HISTORY  Chief Complaint Weakness and Chest Pain   HPI Michel Hendon is a 72 y.o. male patient said he had pain in the left side up under the ribs. He went to urgent care on Sunday and they gave him a Z-Pak and they told him he get better really quick and he did within 2 hours he was feeling better didn't have the pain anymore and he was okay then he was okay yesterday today the pain came back very mild upon the ribs under the left side. Might be little bit worse when he takes a deep breath he is intermittently shortness of having shortness of breath. Is not coughing. Pain currently is the worst he's ever had anywhere. He is not running a fever. Does not feel like his heart pain. Patient points to his abdomen and right of the ribs and states that's where the pain is complex with stabbing up punching him there.   Past Medical History:  Diagnosis Date  . Arthritis   . CAD, multiple vessel 03/2105   LM 65%, mLAD 70%, 90% & 80%, o-pCx 75%, mCx 95% & d Cx 70%, OM1 60%, mRCA 90% with 100% dRCA. R-R collaterals  . Chronic distal aortic occlusion (HCC)    By report  . Chronic kidney disease   . COPD (chronic obstructive pulmonary disease) (HCC)   . Depression   . Enlarged prostate   . Erectile dysfunction   . Essential hypertension   . History of kidney stones    multiple lithotripsies  . Hyperlipidemia LDL goal <70   . Hypogonadism in male    Previously on testosterone injections, but has not yet restarted until further testing  . Muscle pain   . PAF (paroxysmal atrial fibrillation) (HCC) 03/2015   Noted per-cath & post-op CABG.  Dr. [name], cardiology  . Peripheral vascular disease (HCC)    Bilateral ABIs 0.8  . Renal stones  03/2016  . S/P CABG x 5 04/04/2015   LIMA to LAD, SVG to DIAG, SVG to OM, seqSVG -PDA- dCx  . SOB (shortness of breath)   . 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 Weakness    weakness bilateral legs, s/p vein harvesting for CABPG    Patient Active Problem List   Diagnosis Date Noted  . Low back pain 07/23/2017  . Dizziness of unknown cause 03/22/2017  . Orthostatic hypotension 01/02/2017  . Vision changes 01/02/2017  . Near syncope 12/29/2016  . Lumbar radiculopathy 07/18/2016  . Dyspnea on exertion 02/07/2016  . Solitary pulmonary nodule 01/25/2016  . Nephrolithiasis 01/25/2016  . Multinodular goiter 01/25/2016  . Chest wall deformity 01/11/2016  . Neck swelling 01/11/2016  . COPD (chronic obstructive pulmonary disease) (HCC) 01/11/2016  . Greater trochanteric bursitis of left hip 12/28/2015  . Hoarseness of voice 11/18/2015  . Greater trochanteric bursitis of right hip 07/15/2015  . Left hip pain 07/06/2015  . Chronic fatigue 06/29/2015  . Insomnia 06/29/2015  . Absolute anemia 05/18/2015  . Dyslipidemia, goal LDL below 70 04/20/2015  . CAD, multiple vessel   . Coronary artery disease involving native coronary artery with angina pectoris (HCC)   . S/P CABG x 5 04/04/2015  . PAF (paroxysmal atrial fibrillation) (HCC)  03/17/2015  . BPH with obstruction/lower urinary tract symptoms 03/10/2015  . Hypogonadism male 03/04/2015  . Essential hypertension 12/02/2014  . PAD (peripheral artery disease) (HCC) 12/02/2014    Past Surgical History:  Procedure Laterality Date  . BLEPHAROPLASTY    . CARDIAC CATHETERIZATION  1992  . CARDIAC CATHETERIZATION N/A 03/31/2015   Procedure: Left Heart Cath and Coronary Angiography;  Surgeon: Marykay Lex, MD;  Location: [facility] INVASIVE CV LAB: LM 65%, mLAD 70%, 90% & 80%, o-pCx 75%, mCx 95% & d Cx 70%, OM1 60%, mRCA 90% with 100% dRCA. R-R collaterals --> CABG referral. Also noted to be in Afib.  .  Carotid Dopplers  12/30/2016   Patent vertebrals. Mild bilateral carotid plaque, <50% stenosis bilaterally.  . CHOLECYSTECTOMY  2000   also had revision of hernia repair  . CORONARY ARTERY BYPASS GRAFT N/A 04/04/2015   Procedure: CORONARY ARTERY BYPASS GRAFTING (CABG);  Surgeon: [name], MD;  Location: [facility] OR;  Service: Open Heart Surgery;  Laterality: N/A;  . CYSTECTOMY     face and scrotum  . CYSTOSCOPY WITH INSERTION OF UROLIFT N/A 11/12/2016   Procedure: CYSTOSCOPY WITH INSERTION OF UROLIFT x4;  Surgeon: Jethro Bolus, MD;  Location: [facility] ORS;  Service: Urology;  Laterality: N/A;  . CYSTOSCOPY WITH RETROGRADE PYELOGRAM, URETEROSCOPY AND STENT PLACEMENT Right 06/26/2016   Procedure: CYSTOSCOPY WITH RIGHT RETROGRADE PYELOGRAM, RIGHT URETEROSCOPY , HOLMIUM LASER, BASKET EXTRACTION AND STENT PLACEMENT;  Surgeon: Jethro Bolus, MD;  Location: [facility] ORS;  Service: Urology;  Laterality: Right;  . Eugenie Birks Myoview  June 2010   converted to [facility] from treadmill due to inability to reach target heart rate) --the study was normal with no evidence of ischemia and normal LV function. EF 7  . LITHOTRIPSY    . TEE WITHOUT CARDIOVERSION N/A 04/04/2015   Procedure: TRANSESOPHAGEAL ECHOCARDIOGRAM (TEE);  Surgeon: [name], MD;  Location: [facility] OR;  Service: Open Heart Surgery;  Laterality: N/A;  . TRANSTHORACIC ECHOCARDIOGRAM  12/30/2016   EF 60-65%. GR 1 DD. Mildly dilated ascending aorta. Otherwise essentially normal with no valve lesions.  Marland Kitchen VASECTOMY  1975  . VENTRAL HERNIA REPAIR  1994   complicated by infection    Prior to Admission medications   Medication Sig Start Date End Date Taking? Authorizing Provider  atorvastatin (LIPITOR) 40 MG tablet Take 1 tablet (40 mg total) by mouth daily. 04/18/17  Yes Glori Luis, MD  azithromycin (ZITHROMAX) 250 MG tablet Take 1 tablet by mouth daily. 09/15/17  Yes [provider]  clopidogrel (PLAVIX) 75 MG tablet Take 1  tablet (75 mg total) by mouth daily. 08/03/16  Yes Glori Luis, MD  diphenhydramine-acetaminophen (TYLENOL PM) 25-500 MG TABS tablet Take 1 tablet by mouth at bedtime as needed (sleep).   Yes [provider]  finasteride (PROSCAR) 5 MG tablet Take 1 tablet (5 mg total) by mouth daily. 09/16/17  Yes Glori Luis, MD  gabapentin (NEURONTIN) 100 MG capsule TAKE ONE CAPSULE BY MOUTH TWICE A DAY 05/14/17  Yes Antoine Primas M, DO  metoprolol tartrate (LOPRESSOR) 25 MG tablet TAKE 1/2 TABLET TWICE DAILY 09/13/17  Yes Glori Luis, MD  tamsulosin (FLOMAX) 0.4 MG CAPS capsule Take 0.4 mg by mouth daily.  05/02/16  Yes [provider]  acetaminophen (TYLENOL) 650 MG CR tablet Take 650 mg by mouth every 8 (eight) hours as needed for pain. Reported on 01/03/2016    [provider]  NITROSTAT 0.4 MG SL tablet PLACE 1  TABLET (0.4 MG TOTAL) UNDER THE TONGUE EVERY 5 (FIVE) MINUTES AS NEEDED FOR CHEST PAIN. 01/01/17   Marykay LexHarding, David W, MD  testosterone cypionate (DEPOTESTOSTERONE CYPIONATE) 200 MG/ML injection Inject 100 mg into the muscle every 14 (fourteen) days.  10/24/16   [provider]    Allergies Patient has no known allergies.  Family History  Problem Relation Age of Onset  . Coronary artery disease Father   . Heart disease Father   . Stroke Mother   . Hearing loss Mother   . Stroke Sister   . Hearing loss Maternal Grandmother   . Prostate cancer Neg Hx   . Kidney disease Neg Hx   . Other Neg Hx        hypogonadism    Social History Social History   Tobacco Use  . Smoking status: Former Smoker    Packs/day: 2.00    Years: 40.00    Pack years: 80.00    Types: Cigarettes    Last attempt to quit: 10/17/1992    Years since quitting: 24.9  . Smokeless tobacco: Never Used  . Tobacco comment: quit 1994  Substance Use Topics  . Alcohol use: Yes    Alcohol/week: 0.6 oz    Types: 1 Standard drinks or equivalent per week    Comment: seldom -  vodka or wine  . Drug use: No    Review of Systems  Constitutional: No fever/chills Eyes: No visual changes. ENT: No sore throat. Cardiovascular: see history of present illness Respiratory: the history of present illness Gastrointestinal:  abdominal pain.  No nausea, no vomiting.  No diarrhea.  No constipation. Genitourinary: Negative for dysuria. Musculoskeletal: Negative for back pain. Skin: Negative for rash. Neurological: Negative for headaches, focal weakness   ____________________________________________   PHYSICAL EXAM:  VITAL SIGNS: ED Triage Vitals  Enc Vitals Group     BP 09/17/17 1130 (!) 164/94     Pulse Rate 09/17/17 1130 80     Resp 09/17/17 1130 (!) 21     Temp --      Temp src --      SpO2 09/17/17 1130 96 %     Weight 09/17/17 1018 210 lb (95.3 kg)     Height 09/17/17 1018 5\' 9"  (1.753 m)     Head Circumference --      Peak Flow --      Pain Score 09/17/17 1018 10     Pain Loc --      Pain Edu? --      Excl. in GC? --     Constitutional: Alert and oriented. Well appearing and in no acute distress. Eyes: Conjunctivae are normal Head: Atraumatic. Nose: No congestion/rhinnorhea. Mouth/Throat: Mucous membranes are moist.  Oropharynx non-erythematous. Neck: No stridor.  Cardiovascular: Normal rate, regular rhythm. Grossly normal heart sounds.  Good peripheral circulation. Respiratory: Normal respiratory effort.  No retractions. Lungs CTAB. Gastrointestinal: Soft she reports tenderness on the left side of the abdomen to palpation only. No distention. No abdominal bruits. No CVA tenderness. Musculoskeletal: No lower extremity tenderness nor edema.  No joint effusions. Neurologic:  Normal speech and language. No gross focal neurologic deficits are appreciated. No gait instability. Skin:  Skin is warm, dry and intact. No rash noted. Psychiatric: Mood and affect are normal. Speech and behavior are normal.  ____________________________________________     LABS (all labs ordered are listed, but only abnormal results are displayed)  Labs Reviewed  BASIC METABOLIC PANEL - Abnormal; Notable for the following components:  Result Value   GFR calc non Af Amer 56 (*)    All other components within normal limits  HEPATIC FUNCTION PANEL - Abnormal; Notable for the following components:   Bilirubin, Direct <0.1 (*)    All other components within normal limits  LIPASE, BLOOD - Abnormal; Notable for the following components:   Lipase 55 (*)    All other components within normal limits  CBC  TROPONIN I  TROPONIN I   ____________________________________________  EKG  EKG read and interpreted by me shows normal sinus rhythm rate of 77 normal axis. Patient has a short PR interval occasional ectopic beats ____________________________________________  RADIOLOGY  ED MD interpretation: chest x-ray reviewed by myself and I agree with the radiologist reading of no acute abnormalities COPD.  CT scans discussed with radiology show 5-6 mm stone proximally obstructing the left kidney and another stone about 2 mm at the UVJ. Discussed this with Dr. Alphonsa Overall office at [facility] urology. They can see him tomorrow.  Official radiology report(s): Dg Chest 2 View  Result Date: 09/17/2017 CLINICAL DATA:  Chest pain for 2 days EXAM: CHEST  2 VIEW COMPARISON:  12/30/2016 FINDINGS: Cardiac shadow is stable. Postoperative changes are again seen. Lungs are hyperinflated consistent with COPD. No focal infiltrate or sizable effusion is noted. Degenerative changes of the thoracic spine are noted. IMPRESSION: COPD without acute abnormality. Electronically Signed   By: Alcide Clever M.D.   On: 09/17/2017 11:02   Ct Angio Chest Pe W And/or Wo Contrast  Result Date: 09/17/2017 CLINICAL DATA:  72 year old male with a history chest pain EXAM: CT ANGIOGRAPHY CHEST CT ABDOMEN AND PELVIS WITH CONTRAST TECHNIQUE: Multidetector CT imaging of the chest was performed using the  standard protocol during bolus administration of intravenous contrast. Multiplanar CT image reconstructions and MIPs were obtained to evaluate the vascular anatomy. Multidetector CT imaging of the abdomen and pelvis was performed using the standard protocol during bolus administration of intravenous contrast. CONTRAST:  ISOVUE-370 IOPAMIDOL (ISOVUE-370) INJECTION 76% COMPARISON:  None. FINDINGS: CTA CHEST FINDINGS Cardiovascular: Heart: Borderline enlarged heart. No pericardial fluid/thickening. Surgical changes of median sternotomy and CABG. Minimal calcifications of aortic valve. Native coronary calcifications. Aorta: Mild atherosclerotic changes of the thoracic aorta. No dissection. No aneurysm. No periaortic fluid. Common origin of the innominate artery an the left common carotid artery. Branch vessels are patent with atherosclerotic changes at the origin. Atherosclerotic changes of the descending thoracic aorta with no dissection flap. Pulmonary arteries: No central, lobar, segmental, or proximal subsegmental filling defects. Mediastinum/Nodes: No mediastinal adenopathy. Small retained enteric contrast within the thoracic esophagus. Hiatal hernia. Unremarkable appearance of the thoracic inlet and thyroid. Lungs/Pleura: Respiratory motion somewhat limits evaluation the lungs. Centrilobular and paraseptal emphysema most pronounced at the apices. No confluent airspace disease. Atelectasis/scarring at the dependent lung bases. No interlobular septal thickening. No peribronchial fluid. No endobronchial or endotracheal debris. No pleural effusion. No pneumothorax. Musculoskeletal: Accentuated kyphotic curvature of the thoracic spine. No bony canal narrowing. No significant foraminal narrowing of the thoracic spine. Flowing anterior osteophyte production. Review of the MIP images confirms the above findings. CT ABDOMEN and PELVIS FINDINGS Hepatobiliary: Focal fatty infiltration adjacent to the falciform  ligament. Cholecystectomy. No extrahepatic or intrahepatic biliary ductal dilatation. Pancreas: Unremarkable pancreas Spleen: Unremarkable spleen Adrenals/Urinary Tract: Unremarkable appearance of the bilateral adrenal glands. No right-sided hydronephrosis. Small punctate calcifications may reflect non obstructive stones versus vascular calcifications. Unremarkable course of the right ureter. Mild left-sided hydronephrosis with proximal left ureteral stone measuring approximately  5-6 mm. Perinephric stranding on the left. There are multiple additional nonobstructive stones in the left collecting system, predominantly in the lower pole, all of which which measure perhaps 2 mm-3 mm. Ureter is decompressed to the distal aspect. There is a small additional 2 mm stone at the ureterovesical junction. Unremarkable appearance of the urinary bladder. Stomach/Bowel: Stomach distended with enteric contrast. Hiatal hernia. No abnormally distended small bowel. No transition point. Partial malrotation, with rightward sweep of the duodenum. Short segment of small bowel is entrapped within ventral wall hernia without transition point or significant inflammatory changes. Normal appendix. No significant stool burden. Colonic diverticula, concentrated within the sigmoid colon. No evidence of acute inflammatory changes. Vascular/Lymphatic: Advanced atherosclerotic changes of the abdominal aorta. No dissection. Significant aortoiliac disease with occlusion of the distal aorta/common iliac arteries. Reconstitution of the bilateral common iliac arteries with maintained flow through the external iliac arteries and the bilateral hypogastric arteries. Proximal SFA and profunda femoris patent bilaterally. Mesenteric atherosclerosis involving the origin of celiac artery, superior mesenteric artery, and inferior mesenteric artery which is significantly enlarged. Bilateral renal artery atherosclerosis, likely at least 50% stenosis bilaterally.  Reproductive: Left-sided varicocele. Calcifications of the prostate with transverse diameter measuring approximately 3.9 cm. Other: Fat containing ventral wall hernia, which also contains short segment small bowel. No evidence of obstruction. Bilateral fact containing inguinal hernia. Musculoskeletal: Multilevel degenerative changes of the thoracolumbar spine. Facet changes are most pronounced in the lower lumbar spine. No acute fracture. Mild degenerative changes of the hips. Review of the MIP images confirms the above findings. IMPRESSION: CTA chest negative for pulmonary emboli. No acute finding of the chest. Obstructing left ureteral stone with mild hydronephrosis. There is an additional small stone in the distal left ureter at the ureterovesical junction. If there is concern for ascending urinary tract infection, recommend correlation with urinalysis. Bilateral nonobstructing stones of the collecting system, left greater than right. Advanced aortic atherosclerosis with aortoiliac disease including bilateral common iliac artery occlusions. Reconstitution of the bilateral iliac system via collateral flow. Aortic Atherosclerosis (ICD10-I70.0). Mesenteric atherosclerosis, with at least high-grade stenosis of the SMA and likely the celiac artery. If there is concern for symptoms of chronic mesenteric ischemia, recommend correlation with mesenteric duplex exam. Paraseptal and centrilobular emphysema.  Emphysema (ICD10-J43.9). Surgical changes of median sternotomy and CABG. Diverticular disease without evidence of acute diverticulitis. Ventral wall hernia containing short segment of small bowel without obstruction. Hiatal hernia Electronically Signed   By: Gilmer Mor D.O.   On: 09/17/2017 13:33   Ct Abdomen Pelvis W Contrast  Result Date: 09/17/2017 CLINICAL DATA:  72 year old male with a history chest pain EXAM: CT ANGIOGRAPHY CHEST CT ABDOMEN AND PELVIS WITH CONTRAST TECHNIQUE: Multidetector CT imaging of the  chest was performed using the standard protocol during bolus administration of intravenous contrast. Multiplanar CT image reconstructions and MIPs were obtained to evaluate the vascular anatomy. Multidetector CT imaging of the abdomen and pelvis was performed using the standard protocol during bolus administration of intravenous contrast. CONTRAST:  ISOVUE-370 IOPAMIDOL (ISOVUE-370) INJECTION 76% COMPARISON:  None. FINDINGS: CTA CHEST FINDINGS Cardiovascular: Heart: Borderline enlarged heart. No pericardial fluid/thickening. Surgical changes of median sternotomy and CABG. Minimal calcifications of aortic valve. Native coronary calcifications. Aorta: Mild atherosclerotic changes of the thoracic aorta. No dissection. No aneurysm. No periaortic fluid. Common origin of the innominate artery an the left common carotid artery. Branch vessels are patent with atherosclerotic changes at the origin. Atherosclerotic changes of the descending thoracic aorta with no dissection flap.  Pulmonary arteries: No central, lobar, segmental, or proximal subsegmental filling defects. Mediastinum/Nodes: No mediastinal adenopathy. Small retained enteric contrast within the thoracic esophagus. Hiatal hernia. Unremarkable appearance of the thoracic inlet and thyroid. Lungs/Pleura: Respiratory motion somewhat limits evaluation the lungs. Centrilobular and paraseptal emphysema most pronounced at the apices. No confluent airspace disease. Atelectasis/scarring at the dependent lung bases. No interlobular septal thickening. No peribronchial fluid. No endobronchial or endotracheal debris. No pleural effusion. No pneumothorax. Musculoskeletal: Accentuated kyphotic curvature of the thoracic spine. No bony canal narrowing. No significant foraminal narrowing of the thoracic spine. Flowing anterior osteophyte production. Review of the MIP images confirms the above findings. CT ABDOMEN and PELVIS FINDINGS Hepatobiliary: Focal fatty infiltration  adjacent to the falciform ligament. Cholecystectomy. No extrahepatic or intrahepatic biliary ductal dilatation. Pancreas: Unremarkable pancreas Spleen: Unremarkable spleen Adrenals/Urinary Tract: Unremarkable appearance of the bilateral adrenal glands. No right-sided hydronephrosis. Small punctate calcifications may reflect non obstructive stones versus vascular calcifications. Unremarkable course of the right ureter. Mild left-sided hydronephrosis with proximal left ureteral stone measuring approximately 5-6 mm. Perinephric stranding on the left. There are multiple additional nonobstructive stones in the left collecting system, predominantly in the lower pole, all of which which measure perhaps 2 mm-3 mm. Ureter is decompressed to the distal aspect. There is a small additional 2 mm stone at the ureterovesical junction. Unremarkable appearance of the urinary bladder. Stomach/Bowel: Stomach distended with enteric contrast. Hiatal hernia. No abnormally distended small bowel. No transition point. Partial malrotation, with rightward sweep of the duodenum. Short segment of small bowel is entrapped within ventral wall hernia without transition point or significant inflammatory changes. Normal appendix. No significant stool burden. Colonic diverticula, concentrated within the sigmoid colon. No evidence of acute inflammatory changes. Vascular/Lymphatic: Advanced atherosclerotic changes of the abdominal aorta. No dissection. Significant aortoiliac disease with occlusion of the distal aorta/common iliac arteries. Reconstitution of the bilateral common iliac arteries with maintained flow through the external iliac arteries and the bilateral hypogastric arteries. Proximal SFA and profunda femoris patent bilaterally. Mesenteric atherosclerosis involving the origin of celiac artery, superior mesenteric artery, and inferior mesenteric artery which is significantly enlarged. Bilateral renal artery atherosclerosis, likely at least  50% stenosis bilaterally. Reproductive: Left-sided varicocele. Calcifications of the prostate with transverse diameter measuring approximately 3.9 cm. Other: Fat containing ventral wall hernia, which also contains short segment small bowel. No evidence of obstruction. Bilateral fact containing inguinal hernia. Musculoskeletal: Multilevel degenerative changes of the thoracolumbar spine. Facet changes are most pronounced in the lower lumbar spine. No acute fracture. Mild degenerative changes of the hips. Review of the MIP images confirms the above findings. IMPRESSION: CTA chest negative for pulmonary emboli. No acute finding of the chest. Obstructing left ureteral stone with mild hydronephrosis. There is an additional small stone in the distal left ureter at the ureterovesical junction. If there is concern for ascending urinary tract infection, recommend correlation with urinalysis. Bilateral nonobstructing stones of the collecting system, left greater than right. Advanced aortic atherosclerosis with aortoiliac disease including bilateral common iliac artery occlusions. Reconstitution of the bilateral iliac system via collateral flow. Aortic Atherosclerosis (ICD10-I70.0). Mesenteric atherosclerosis, with at least high-grade stenosis of the SMA and likely the celiac artery. If there is concern for symptoms of chronic mesenteric ischemia, recommend correlation with mesenteric duplex exam. Paraseptal and centrilobular emphysema.  Emphysema (ICD10-J43.9). Surgical changes of median sternotomy and CABG. Diverticular disease without evidence of acute diverticulitis. Ventral wall hernia containing short segment of small bowel without obstruction. Hiatal hernia Electronically Signed   By: Marijean Niemann  Loreta Ave D.O.   On: 09/17/2017 13:33    ____________________________________________   PROCEDURES  Procedure(s) performed: Procedures  Critical Care performed:   ____________________________________________   INITIAL  IMPRESSION / ASSESSMENT AND PLAN / ED COURSE   we'll discharge the patient with Percocet he will see his urologist tomorrow return if worse or go to [facility] where they can see him at the hospital if need be.    Clinical Course as of Sep 18 1407  Tue Sep 17, 2017  1135 CO2: 26 [PM]    Clinical Course User Index [PM] Arnaldo Natal, MD     ____________________________________________   FINAL CLINICAL IMPRESSION(S) / ED DIAGNOSES  Final diagnoses:  Renal colic     ED Discharge Orders    None       Note:  This document was prepared using Dragon voice recognition software and may include unintentional dictation errors.    Arnaldo Natal, MD 09/17/17 1409

## 2017-09-18 DIAGNOSIS — N200 Calculus of kidney: Secondary | ICD-10-CM | POA: Diagnosis not present

## 2017-09-18 DIAGNOSIS — E291 Testicular hypofunction: Secondary | ICD-10-CM | POA: Diagnosis not present

## 2017-09-19 ENCOUNTER — Telehealth: Payer: Self-pay | Admitting: Cardiology

## 2017-09-19 ENCOUNTER — Encounter: Payer: Self-pay | Admitting: Cardiology

## 2017-09-19 NOTE — Telephone Encounter (Signed)
New Message   Patient was seen on 07/02/17      [facility] Pre-operative Risk Assessment    Request for surgical clearance:  1. What type of surgery is being performed? Ureteroscopy   2. When is this surgery scheduled?  Next week   3. What type of clearance is required (medical clearance vs. Pharmacy clearance to hold med vs. Both)? both  4. Are there any medications that need to be held prior to surgery and how long? plavix 5 day s  5. Practice name and name of physician performing surgery? Dr Gloriann Loan  6. What is your office phone and fax number? [phone]  Fax [phone]  7. Anesthesia type (None, local, MAC, general) ? general   Howie Ill 09/19/2017, 1:58 PM  _________________________________________________________________   (provider comments below)

## 2017-09-19 NOTE — Telephone Encounter (Signed)
   Primary Cardiologist: No primary care provider on file.  Chart reviewed as part of pre-operative protocol coverage. Patient was contacted 09/19/2017 in reference to pre-operative risk assessment for pending surgery as outlined below.  [name] was last seen on  07/02/17 by Dr Herbie BaltimoreHarding.  Since that day, [name] has done well, no new cardiac complaints.  Therefore, based on ACC/AHA guidelines, the patient would be at acceptable risk for the planned procedure without further cardiovascular testing.   I will route this recommendation to the requesting party via Epic fax function and remove from pre-op pool.  Please call with questions.  Corine ShelterLuke Shihab States, PA-C 09/19/2017, 4:33 PM

## 2017-09-20 ENCOUNTER — Encounter (HOSPITAL_COMMUNITY): Payer: Self-pay | Admitting: *Deleted

## 2017-09-20 ENCOUNTER — Telehealth: Payer: Self-pay | Admitting: Cardiology

## 2017-09-20 ENCOUNTER — Other Ambulatory Visit: Payer: Self-pay | Admitting: Urology

## 2017-09-20 ENCOUNTER — Other Ambulatory Visit: Payer: Self-pay

## 2017-09-20 DIAGNOSIS — N201 Calculus of ureter: Secondary | ICD-10-CM | POA: Diagnosis not present

## 2017-09-20 DIAGNOSIS — R1084 Generalized abdominal pain: Secondary | ICD-10-CM | POA: Diagnosis not present

## 2017-09-20 NOTE — Telephone Encounter (Signed)
Patient calling,  States that he is "having a lot of anxiety and is hyper at times. Some of this is due to my hypochondria of being closed in and feeling trapped but I guess I got this trait from my mother. Is there something mild that I can try to keep me more relaxed."  Patient is scheduled for Kidney stone removal on Monday and would like to know if there is a prescription that could be called in for anxiety? See preop clearance note on 09-19-17

## 2017-09-20 NOTE — Telephone Encounter (Signed)
Returned the call to the patient. He stated that he feels like it is time for him to be on anxitey medication. Not just for the procedure but on an as needed basis. He stated that he had spoken with Dr. Herbie BaltimoreHarding in the past about this and now he feels like he is ready. He has been advised to call his PCP for the medication but he stated that his PCP said that he would have to go through his cardiologist. Message routed to the provider for his recommendation on prescribing the medication.

## 2017-09-23 ENCOUNTER — Ambulatory Visit (HOSPITAL_COMMUNITY): Payer: Medicare Other | Admitting: Certified Registered"

## 2017-09-23 ENCOUNTER — Encounter (HOSPITAL_COMMUNITY)
Admission: RE | Disposition: A | Discharge status: Home / Self Care | Payer: Self-pay | Source: Ambulatory Visit | Attending: Urology

## 2017-09-23 ENCOUNTER — Ambulatory Visit (HOSPITAL_COMMUNITY)
Admission: RE | Admit: 2017-09-23 | Discharge: 2017-09-23 | Disposition: A | Discharge status: Home / Self Care | Payer: Medicare Other | Source: Ambulatory Visit | Attending: Urology | Admitting: Urology

## 2017-09-23 ENCOUNTER — Encounter (HOSPITAL_COMMUNITY): Payer: Self-pay | Admitting: *Deleted

## 2017-09-23 ENCOUNTER — Ambulatory Visit (HOSPITAL_COMMUNITY): Payer: Medicare Other

## 2017-09-23 DIAGNOSIS — I2510 Atherosclerotic heart disease of native coronary artery without angina pectoris: Secondary | ICD-10-CM | POA: Insufficient documentation

## 2017-09-23 DIAGNOSIS — J45909 Unspecified asthma, uncomplicated: Secondary | ICD-10-CM | POA: Diagnosis not present

## 2017-09-23 DIAGNOSIS — Z87442 Personal history of urinary calculi: Secondary | ICD-10-CM | POA: Diagnosis not present

## 2017-09-23 DIAGNOSIS — I739 Peripheral vascular disease, unspecified: Secondary | ICD-10-CM | POA: Insufficient documentation

## 2017-09-23 DIAGNOSIS — Z87891 Personal history of nicotine dependence: Secondary | ICD-10-CM | POA: Diagnosis not present

## 2017-09-23 DIAGNOSIS — N132 Hydronephrosis with renal and ureteral calculous obstruction: Secondary | ICD-10-CM | POA: Diagnosis not present

## 2017-09-23 DIAGNOSIS — D649 Anemia, unspecified: Secondary | ICD-10-CM | POA: Diagnosis not present

## 2017-09-23 DIAGNOSIS — F329 Major depressive disorder, single episode, unspecified: Secondary | ICD-10-CM | POA: Diagnosis not present

## 2017-09-23 DIAGNOSIS — M1990 Unspecified osteoarthritis, unspecified site: Secondary | ICD-10-CM | POA: Insufficient documentation

## 2017-09-23 DIAGNOSIS — N201 Calculus of ureter: Secondary | ICD-10-CM | POA: Diagnosis not present

## 2017-09-23 DIAGNOSIS — Z79899 Other long term (current) drug therapy: Secondary | ICD-10-CM | POA: Insufficient documentation

## 2017-09-23 DIAGNOSIS — E7800 Pure hypercholesterolemia, unspecified: Secondary | ICD-10-CM | POA: Insufficient documentation

## 2017-09-23 DIAGNOSIS — N401 Enlarged prostate with lower urinary tract symptoms: Secondary | ICD-10-CM | POA: Diagnosis not present

## 2017-09-23 DIAGNOSIS — N202 Calculus of kidney with calculus of ureter: Secondary | ICD-10-CM | POA: Diagnosis not present

## 2017-09-23 DIAGNOSIS — I10 Essential (primary) hypertension: Secondary | ICD-10-CM | POA: Diagnosis not present

## 2017-09-23 DIAGNOSIS — I4891 Unspecified atrial fibrillation: Secondary | ICD-10-CM | POA: Insufficient documentation

## 2017-09-23 HISTORY — PX: CYSTOSCOPY WITH RETROGRADE PYELOGRAM, URETEROSCOPY AND STENT PLACEMENT: SHX5789

## 2017-09-23 SURGERY — CYSTOURETEROSCOPY, WITH RETROGRADE PYELOGRAM AND STENT INSERTION
Anesthesia: General | Laterality: Left

## 2017-09-23 MED ORDER — OXYBUTYNIN CHLORIDE 5 MG PO TABS: 5.0000 mg | ORAL_TABLET | Freq: Three times a day (TID) | ORAL | 3 refills | Status: DC | PRN

## 2017-09-23 MED ORDER — FENTANYL CITRATE (PF) 250 MCG/5ML IJ SOLN
INTRAMUSCULAR | Status: DC | PRN
  Administered 2017-09-23 (×2): 25 ug via INTRAVENOUS
  Administered 2017-09-23: 100 ug via INTRAVENOUS

## 2017-09-23 MED ORDER — IOHEXOL 300 MG/ML  SOLN
INTRAMUSCULAR | Status: DC | PRN
  Administered 2017-09-23: 10 mL

## 2017-09-23 MED ORDER — OXYBUTYNIN CHLORIDE 5 MG PO TABS: 5.0000 mg | ORAL_TABLET | Freq: Three times a day (TID) | ORAL | Status: DC | PRN

## 2017-09-23 MED ORDER — PROPOFOL 10 MG/ML IV BOLUS
INTRAVENOUS | Status: AC
  Filled 2017-09-23: qty 20

## 2017-09-23 MED ORDER — ONDANSETRON HCL 4 MG/2ML IJ SOLN
INTRAMUSCULAR | Status: DC | PRN
Start: 2017-09-23 — End: 2017-09-23
  Administered 2017-09-23: 4 mg via INTRAVENOUS

## 2017-09-23 MED ORDER — FENTANYL CITRATE (PF) 250 MCG/5ML IJ SOLN
INTRAMUSCULAR | Status: AC
  Filled 2017-09-23: qty 5

## 2017-09-23 MED ORDER — LIDOCAINE 2% (20 MG/ML) 5 ML SYRINGE
INTRAMUSCULAR | Status: AC
Start: 2017-09-23 — End: ?
  Filled 2017-09-23: qty 5

## 2017-09-23 MED ORDER — LACTATED RINGERS IV SOLN
INTRAVENOUS | Status: DC
  Administered 2017-09-23 (×2): via INTRAVENOUS

## 2017-09-23 MED ORDER — ONDANSETRON HCL 4 MG/2ML IJ SOLN
INTRAMUSCULAR | Status: AC
  Filled 2017-09-23: qty 2

## 2017-09-23 MED ORDER — MIDAZOLAM HCL 2 MG/2ML IJ SOLN
INTRAMUSCULAR | Status: AC
  Filled 2017-09-23: qty 2

## 2017-09-23 MED ORDER — DEXAMETHASONE SODIUM PHOSPHATE 10 MG/ML IJ SOLN
INTRAMUSCULAR | Status: DC | PRN
  Administered 2017-09-23: 10 mg via INTRAVENOUS

## 2017-09-23 MED ORDER — PHENYLEPHRINE 40 MCG/ML (10ML) SYRINGE FOR IV PUSH (FOR BLOOD PRESSURE SUPPORT)
PREFILLED_SYRINGE | INTRAVENOUS | Status: DC | PRN
  Administered 2017-09-23: 120 ug via INTRAVENOUS
  Administered 2017-09-23: 80 ug via INTRAVENOUS
  Administered 2017-09-23: 120 ug via INTRAVENOUS
  Administered 2017-09-23: 80 ug via INTRAVENOUS

## 2017-09-23 MED ORDER — PHENYLEPHRINE 40 MCG/ML (10ML) SYRINGE FOR IV PUSH (FOR BLOOD PRESSURE SUPPORT)
PREFILLED_SYRINGE | INTRAVENOUS | Status: AC
  Filled 2017-09-23: qty 20

## 2017-09-23 MED ORDER — MIDAZOLAM HCL 2 MG/2ML IJ SOLN
INTRAMUSCULAR | Status: DC | PRN
  Administered 2017-09-23 (×2): 1 mg via INTRAVENOUS

## 2017-09-23 MED ORDER — LIDOCAINE 2% (20 MG/ML) 5 ML SYRINGE
INTRAMUSCULAR | Status: DC | PRN
  Administered 2017-09-23: 40 mg via INTRAVENOUS

## 2017-09-23 MED ORDER — SODIUM CHLORIDE 0.9 % IR SOLN
Status: DC | PRN
  Administered 2017-09-23: 3000 mL

## 2017-09-23 MED ORDER — OXYCODONE HCL 5 MG PO TABS
5.0000 mg | ORAL_TABLET | Freq: Four times a day (QID) | ORAL | 0 refills | Status: DC | PRN
Start: 2017-09-23 — End: 2017-09-27

## 2017-09-23 MED ORDER — PROPOFOL 10 MG/ML IV BOLUS
INTRAVENOUS | Status: DC | PRN
  Administered 2017-09-23: 180 mg via INTRAVENOUS
  Administered 2017-09-23: 20 mg via INTRAVENOUS

## 2017-09-23 MED ORDER — DEXAMETHASONE SODIUM PHOSPHATE 10 MG/ML IJ SOLN
INTRAMUSCULAR | Status: AC
  Filled 2017-09-23: qty 1

## 2017-09-23 MED ORDER — CEFAZOLIN SODIUM-DEXTROSE 2-4 GM/100ML-% IV SOLN
2.0000 g | INTRAVENOUS | Status: AC
  Administered 2017-09-23: 2 g via INTRAVENOUS
  Filled 2017-09-23: qty 100

## 2017-09-23 SURGICAL SUPPLY — 23 items
BAG URO CATCHER STRL LF (MISCELLANEOUS) ×3 IMPLANT
BASKET LASER NITINOL 1.9FR (BASKET) IMPLANT
BASKET ZERO TIP NITINOL 2.4FR (BASKET) ×2 IMPLANT
BSKT STON RTRVL 120 1.9FR (BASKET)
BSKT STON RTRVL ZERO TP 2.4FR (BASKET) ×1
CATH INTERMIT  6FR 70CM (CATHETERS) ×2 IMPLANT
CATH URET 5FR 28IN CONE TIP (BALLOONS)
CATH URET 5FR 70CM CONE TIP (BALLOONS) IMPLANT
CLOTH BEACON ORANGE TIMEOUT ST (SAFETY) ×3 IMPLANT
COVER FOOTSWITCH UNIV (MISCELLANEOUS) ×3 IMPLANT
FIBER LASER FLEXIVA 365 (UROLOGICAL SUPPLIES) IMPLANT
FIBER LASER TRAC TIP (UROLOGICAL SUPPLIES) IMPLANT
GLOVE BIOGEL M STRL SZ7.5 (GLOVE) ×3 IMPLANT
GOWN STRL REUS W/TWL XL LVL3 (GOWN DISPOSABLE) ×3 IMPLANT
GUIDEWIRE ANG ZIPWIRE 038X150 (WIRE) IMPLANT
GUIDEWIRE STR DUAL SENSOR (WIRE) ×3 IMPLANT
MANIFOLD NEPTUNE II (INSTRUMENTS) ×3 IMPLANT
PACK CYSTO (CUSTOM PROCEDURE TRAY) ×3 IMPLANT
SHEATH ACCESS URETERAL 24CM (SHEATH) IMPLANT
SHEATH ACCESS URETERAL 54CM (SHEATH) IMPLANT
SHEATH URETERAL 12FRX35CM (MISCELLANEOUS) IMPLANT
STENT URET 6FRX24 CONTOUR (STENTS) ×2 IMPLANT
TUBING UROLOGY SET (TUBING) ×3 IMPLANT

## 2017-09-23 NOTE — Discharge Instructions (Signed)

## 2017-09-23 NOTE — Anesthesia Procedure Notes (Signed)
Procedure Name: LMA Insertion Date/Time: 09/23/2017 2:23 PM Performed by: Minerva EndsMirarchi, Teighan Aubert M, CRNA Pre-anesthesia Checklist: Patient identified, Emergency Drugs available, Suction available and Patient being monitored Patient Re-evaluated:Patient Re-evaluated prior to induction Oxygen Delivery Method: Circle System Utilized Preoxygenation: Pre-oxygenation with 100% oxygen Induction Type: IV induction Ventilation: Mask ventilation without difficulty LMA: LMA inserted LMA Size: 4.0 Number of attempts: 1 Placement Confirmation: positive ETCO2 Tube secured with: Tape Dental Injury: Teeth and Oropharynx as per pre-operative assessment  Comments: Smooth IV induction Hodierne--LMA insertion AM CRNA atraumatic--- teeth and mouth as preop-- bilat BS

## 2017-09-23 NOTE — H&P (Signed)
CC/HPI: 09/18/16: Patient with history of hypogonadism (treated with IMTC 1 mL q 2 weeks) and BPH w/ LUTS (tx w/ tamsulosin once daily). He also has history of renal stones. He presented to [facility] ED on 3/5 with complaints of left flank pain and pain up under his rib. He was seen at Urgent care location on 3/3 and was given Z pack. Pain was also radiating to his abdomen, as well. C.T imaging revealed approximatly 6 x 7 mm left proximal ureteral calculus, as well as 2 mm left distal ureteral calculus with associated mild left hydronephrosis. Other bilateral non obstructing calculi were seen. Creatinine noted to be 1.23. Today, he continues to complain of intermittent left flank pain, which is well controlled with Percocet. He denies seeing a stone pass. He does note increased urgency and frequency. He denies dysuria, gross hematuria, fever, nausea, or vomiting. He has had both ESWL and URS for stones in the past. He is currently on Plavix, his cardiologist is Dr. Herbie BaltimoreHarding.   09/20/17: Patient returns today with complaints of progressive left flank pain. He rates is current pain at about a 9. He states that he has been using Percocet, which does help but only for about 3 hours. His last dose was at 6 am. He does complain of intermittent nausea associated with more severe pain. He does admit to low grade fever this morning of 99.6, but is currently afebrile. He denies dysuria, gross hematuria, or exacerbation of voiding symptoms. He is having significant issues with anxiety regarding his symptoms and want me to prescribe something for this. He is scheduled for URS with Dr. Alvester MorinBell on 3/11.     ALLERGIES: None    MEDICATIONS: Finasteride 5 mg tablet  Metoprolol Succinate 25 mg tablet, extended release 24 hr  Tamsulosin Hcl 0.4 mg capsule 1 capsule PO Daily  Testosterone Cypionate 200 mg/ml vial INJECT 1ML IM EVERY 14 DAYS  Testosterone Cypionate 200 mg/ml vial INJECT 1ML IM EVERY 14 DAYS   Testosterone Cypionate 200 mg/ml vial INJECT 1ML IM EVERY 14 DAYS  Acetaminophen Er 650 mg tablet, extended release  Atorvastatin Calcium 40 mg tablet  Clopidogrel 75 mg tablet  Gabapentin 400 mg capsule  Nitroglycerin 0.4 mg tablet, sublingual  Oxycodone-Acetaminophen 5 mg-325 mg tablet     GU PSH: Complex Uroflow - 10/16/2016 Cysto Remove Stent FB Sim - 07/04/2016 Cystoscopy - 10/16/2016 ESWL - 03/26/2016 Locm 300-399Mg /Ml Iodine,1Ml - 06/19/2016 Ureteroscopic laser litho, Right - 06/26/2016 UROLIFT - 11/12/2016    NON-GU PSH: Coronary Artery Bypass Grafting, quadruple    GU PMH: Balanitis - 02/28/2017 BPH w/LUTS - 02/28/2017, - 12/25/2016, - 10/24/2016, - 10/16/2016, - 08/22/2016, - 02/14/2016 Primary hypogonadism - 02/28/2017, - 12/25/2016, - 10/24/2016, - 10/16/2016, - 10/16/2016 Renal calculus - 10/16/2016, - 08/22/2016, - 02/14/2016 Nocturia - 08/22/2016 Urinary Frequency - 08/22/2016 Ureteral calculus - 06/12/2016, - 05/02/2016, - 03/07/2016, - 02/14/2016    NON-GU PMH: Anxiety Arthritis Asthma Heart disease, unspecified Hypercholesterolemia Unspecified atrial fibrillation    FAMILY HISTORY: Death - Father, Mother pneumonia - Father Strokes - Mother   SOCIAL HISTORY: Marital Status: Married Preferred Language: English; Ethnicity: Not Hispanic Or Latino; Race: White Current Smoking Status: Patient does not smoke anymore. Has not smoked since 02/13/1993. Smoked 2 packs per day.  Light Drinker.  Does not drink caffeine. Patient's occupation is/was Retired.    REVIEW OF SYSTEMS:    GU Review Male:   Patient denies frequent urination, hard to postpone urination, burning/ pain with urination,  get up at night to urinate, leakage of urine, stream starts and stops, trouble starting your stream, have to strain to urinate , erection problems, and penile pain.  Gastrointestinal (Upper):   Patient denies nausea, vomiting, and indigestion/ heartburn.  Gastrointestinal (Lower):   Patient denies diarrhea and  constipation.  Constitutional:   Patient denies fever, night sweats, weight loss, and fatigue.  Skin:   Patient denies skin rash/ lesion and itching.  Eyes:   Patient denies blurred vision and double vision.  Ears/ Nose/ Throat:   Patient denies sore throat and sinus problems.  Hematologic/Lymphatic:   Patient denies swollen glands and easy bruising.  Cardiovascular:   Patient denies leg swelling and chest pains.  Respiratory:   Patient denies cough and shortness of breath.  Endocrine:   Patient denies excessive thirst.  Musculoskeletal:   Patient denies back pain and joint pain.  Neurological:   Patient denies headaches and dizziness.  Psychologic:   Patient denies depression and anxiety.   VITAL SIGNS:      09/20/2017 09:59 AM  Weight 212 lb / 96.16 kg  Height 69 in / 175.26 cm  BP 130/75 mmHg  Pulse 84 /min  Temperature 98.1 F / 36.7 C  BMI 31.3 kg/m   MULTI-SYSTEM PHYSICAL EXAMINATION:    Constitutional: Well-nourished. No physical deformities. Normally developed. Good grooming.  Respiratory: No labored breathing, no use of accessory muscles.   Cardiovascular: Normal temperature, normal extremity pulses, no swelling, no varicosities.   Skin: No paleness, no jaundice, no cyanosis. No lesion, no ulcer, no rash.  Neurologic / Psychiatric: Oriented to time, oriented to place, oriented to person. No depression, no anxiety, no agitation.  Gastrointestinal: No mass, no tenderness, no rigidity, non obese abdomen.  Musculoskeletal: Left CVA tenderness of spine, ribs, pelvis. Normal gait and station of head and neck.     PAST DATA REVIEWED:  Source Of History:  Patient  Records Review:   Previous Patient Records  Urine Test Review:   Urinalysis  X-Ray Review: KUB: Reviewed Films.  C.T. Stone Protocol: Reviewed Films. Reviewed Report.     10/16/16  PSA  Total PSA 0.24 ng/dl    78/46/96 29/52/84 13/24/40 11/05/16 10/24/16 10/16/16  Hormones  Testosterone, Total 609.0 ng/dL  1027.2 ng/dL 536.6 pg/dL 440.3 pg/dL 474.2 pg/dL 595.6 pg/dL    PROCEDURES:          Urinalysis w/Scope Dipstick Dipstick Cont'd Micro  Color: Yellow Bilirubin: Neg WBC/hpf: 0 - 5/hpf  Appearance: Clear Ketones: Trace RBC/hpf: 0 - 2/hpf  Specific Gravity: 1.020 Blood: Trace Bacteria: NS (Not Seen)  pH: 6.0 Protein: Neg Cystals: NS (Not Seen)  Glucose: Neg Urobilinogen: 0.2 Casts: NS (Not Seen)    Nitrites: Neg Trichomonas: Not Present    Leukocyte Esterase: Neg Mucous: Not Present      Epithelial Cells: NS (Not Seen)      Yeast: NS (Not Seen)      Sperm: Not Present         Ketoralac 30mg  - [id number], [id number] Qty: 30 Adm. By: Beryle Quant  Unit: mg Lot No [id number]  Route: IM Exp. Date   Freq: None Mfgr.:   Site: Right Buttock   ASSESSMENT:      ICD-10 Details  1 GU:   Ureteral calculus - N20.1   2   Flank Pain - R10.84    PLAN:           Orders Labs Urine Culture  Schedule Procedure: 09/20/2017 at [facility] - [phone] - Ketoralac 30mg  (Toradol Per 15 Mg) - [id number], [phone]          Document Letter(s):  Created for Patient: Clinical Summary         Notes:   Urinalysis today is not concerning for infectious process, but I will send for urine culture. 30 mg Ketoralac given today in the clinic and patient expressed improvement of pain and symptoms. We discussed pain management strategies and I discussed that he may use Percocet every 4-6 hours. I also recommended use of warm compress. He will keep scheduled follow up on Monday for his procedure and hold Plavix starting today (okay with Dr. [name]). I discussed that I would not prescribe for his anxiety and recommended he follow up with his PCP. Return precautions to [facility] ED given over the weekend for fever or progressive pain, nausea, or vomiting.         Next Appointment:      Next Appointment: 09/23/2017 02:00 PM    Appointment Type: Surgery     Location: [facility] [phone]     Provider: Modena Slater, III, M.D.    Reason for Visit: OP WL CYSTO LT RGP LT URS [facility] STENT      CARE TEAM: Bree Erskine Emery McDougald     Signed by Vianne Bulls on 09/20/17 at 2:22 PM (EST

## 2017-09-23 NOTE — Progress Notes (Signed)
Dr Chaney MallingHodierne aware of pt's BP in pacu. No orders received at this time. Pt may be discharged home.

## 2017-09-23 NOTE — Anesthesia Preprocedure Evaluation (Addendum)
Anesthesia Evaluation  Patient identified by MRN, date of birth, ID band Patient awake    Reviewed: Allergy & Precautions, H&P , NPO status , Patient's Chart, lab work & pertinent test results  Airway Mallampati: II   Neck ROM: full    Dental  (+) Dental Advisory Given   Pulmonary COPD, former smoker,    breath sounds clear to auscultation       Cardiovascular hypertension, + CAD, + CABG and + Peripheral Vascular Disease  + dysrhythmias Atrial Fibrillation  Rhythm:regular Rate:Normal     Neuro/Psych PSYCHIATRIC DISORDERS Depression  Neuromuscular disease    GI/Hepatic   Endo/Other    Renal/GU Renal disease     Musculoskeletal  (+) Arthritis ,   Abdominal   Peds  Hematology   Anesthesia Other Findings   Reproductive/Obstetrics                            Anesthesia Physical Anesthesia Plan  ASA: III  Anesthesia Plan: General   Post-op Pain Management:    Induction: Intravenous  PONV Risk Score and Plan: 2 and Ondansetron, Dexamethasone and Treatment may vary due to age or medical condition  Airway Management Planned: LMA  Additional Equipment:   Intra-op Plan:   Post-operative Plan:   Informed Consent: I have reviewed the patients History and Physical, chart, labs and discussed the procedure including the risks, benefits and alternatives for the proposed anesthesia with the patient or authorized representative who has indicated his/her understanding and acceptance.     Plan Discussed with: CRNA, Anesthesiologist and Surgeon  Anesthesia Plan Comments:         Anesthesia Quick Evaluation

## 2017-09-23 NOTE — Transfer of Care (Signed)
Immediate Anesthesia Transfer of Care Note  Patient: Essie HartFrank Willeford  Procedure(s) Performed: CYSTOSCOPY WITH LEFT RETROGRADE PYELOGRAM, URETEROSCOPY STONE EXTRACTION AND STENT PLACEMENT (Left )  Patient Location: PACU  Anesthesia Type:General  Level of Consciousness: sedated  Airway & Oxygen Therapy: Patient Spontanous Breathing and Patient connected to face mask oxygen  Post-op Assessment: Report given to RN and Post -op Vital signs reviewed and stable  Post vital signs: Reviewed and stable  Last Vitals:  Vitals:   09/23/17 1203  BP: (!) 160/90  Pulse: 83  Resp: 18  Temp: 36.7 C  SpO2: 95%    Last Pain:  Vitals:   09/23/17 1232  TempSrc:   PainSc: 2          Complications: No apparent anesthesia complications

## 2017-09-23 NOTE — Interval H&P Note (Signed)
History and Physical Interval Note:  09/23/2017 1:50 PM  [name]  has presented today for surgery, with the diagnosis of LEFT URETERAL STONE  The various methods of treatment have been discussed with the patient and family. After consideration of risks, benefits and other options for treatment, the patient has consented to  Procedure(s): CYSTOSCOPY WITH LEFT RETROGRADE PYELOGRAM, URETEROSCOPY AND STENT PLACEMENT (Left) HOLMIUM LASER APPLICATION (Left) as a surgical intervention .  The patient's history has been reviewed, patient examined, no change in status, stable for surgery.  I have reviewed the patient's chart and labs.  Questions were answered to the patient's satisfaction.     Ray Church, III

## 2017-09-23 NOTE — Op Note (Signed)
Operative Note  Preoperative diagnosis:  1.  Left ureteral calculus  Postoperative diagnosis: 1.  Left ureteral calculus  Procedure(s): 1.  Cystoscopy with left retrograde pyelogram, left diagnostic ureteroscopy with stone extraction, left ureteral stent placement  Surgeon: Modena SlaterEugene Luise Yamamoto, MD  Assistants: None  Anesthesia: General  Complications: None immediate  EBL: Minimal  Specimens: 1.  Ureteral calculus  Drains/Catheters: 1.  6 x 24 double-J ureteral stent with a string on place  Intraoperative findings: 1.  Normal urethra and bladder 2.  To distal left ureteral calculi.  One was approximately 5-6 mm in size in the distal most portion and just behind it was a smaller 2-3 mm calculus.  Both were easily basket extracted.  Left retrograde pyelogram revealed no obvious filling defect and minimal hydronephrosis.  There is some mild fullness to the renal pelvis.  Complete ureteroscopy revealed no other stone fragments up to the level of the renal pelvis.  Indication: 72 year old male who presented with left-sided flank pain was found to have a left-sided ureteral calculus and failed trial of passage.  He presents for the previously mentioned operation.  Description of procedure:  The patient was identified and consent was obtained.  The patient was taken to the operating room and placed in the supine position.  The patient was placed under general anesthesia.  Perioperative antibiotics were administered.  The patient was placed in dorsal lithotomy.  Patient was prepped and draped in a standard sterile fashion and a timeout was performed.  A 21 French rigid cystoscope was advanced into the urethra and into the bladder.  A complete cystoscopy was performed with no abnormal findings.  I attempted to cannulate the left distal ureter with a sensor wire but this at resistance.  I attempted to navigate it in with a open-ended ureteral catheter but there was still resistance.  I therefore  advanced a semirigid ureteroscope up to the ureteral orifice on the left and inserted it just into the distal ureter.  There was immediately a obstructing stone encountered.  I was able to navigate a basket fiber past the stone and used it to grasp the stone and easily removed it from the distal ureter.  I readvanced the scope into the distal ureter and another small stone fragment was seen which was also basket extracted.  Both were collected.  I then advanced a wire through the semirigid ureteroscope and advanced that up to the kidney under fluoroscopic guidance.  I advanced the semirigid ureteroscope alongside the wire up to the level of the renal pelvis and no other stone fragments were seen.  I shot a retrograde pyelogram through the scope with the findings noted above.  I then carefully withdrew the scope and visualized no ureteral calculi or ureteral injury.  I then advanced a 6 x 24 double-J ureteral stent over the wire with a string in place and withdrew the wire.  Fluoroscopy confirmed a good coil within the bladder and also a good coil within the kidney.  Plan: He can remove his stent on Thursday morning.  Follow-up in 6-8 weeks with a renal ultrasound and KUB with Dr. Liliane ShiWinter.

## 2017-09-23 NOTE — Anesthesia Procedure Notes (Signed)
Date/Time: 09/23/2017 3:02 PM Performed by: Minerva EndsMirarchi, Shandon Matson M, CRNA Oxygen Delivery Method: Simple face mask Ventilation: Oral airway inserted - appropriate to patient size

## 2017-09-24 ENCOUNTER — Encounter (HOSPITAL_COMMUNITY): Payer: Self-pay | Admitting: Urology

## 2017-09-26 DIAGNOSIS — N202 Calculus of kidney with calculus of ureter: Secondary | ICD-10-CM | POA: Diagnosis not present

## 2017-09-26 NOTE — Anesthesia Postprocedure Evaluation (Signed)
Anesthesia Post Note  Patient: Essie HartFrank Bufford  Procedure(s) Performed: CYSTOSCOPY WITH LEFT RETROGRADE PYELOGRAM, URETEROSCOPY STONE EXTRACTION AND STENT PLACEMENT (Left )     Patient location during evaluation: PACU Anesthesia Type: General Level of consciousness: awake and alert Pain management: pain level controlled Vital Signs Assessment: post-procedure vital signs reviewed and stable Respiratory status: spontaneous breathing, nonlabored ventilation, respiratory function stable and patient connected to nasal cannula oxygen Cardiovascular status: blood pressure returned to baseline and stable Postop Assessment: no apparent nausea or vomiting Anesthetic complications: no    Last Vitals:  Vitals:   09/23/17 1630 09/23/17 1700  BP: (!) 165/92   Pulse: 89   Resp: 16   Temp:    SpO2: 94% 95%    Last Pain:  Vitals:   09/23/17 1609  TempSrc: Oral  PainSc:                  Noma Quijas S

## 2017-09-27 ENCOUNTER — Encounter: Payer: Self-pay | Admitting: Family Medicine

## 2017-09-27 ENCOUNTER — Ambulatory Visit (INDEPENDENT_AMBULATORY_CARE_PROVIDER_SITE_OTHER): Payer: Medicare Other | Admitting: Family Medicine

## 2017-09-27 VITALS — BP 102/64 | HR 70 | Temp 97.4°F | Wt 208.8 lb

## 2017-09-27 DIAGNOSIS — H539 Unspecified visual disturbance: Secondary | ICD-10-CM | POA: Diagnosis not present

## 2017-09-27 DIAGNOSIS — F419 Anxiety disorder, unspecified: Secondary | ICD-10-CM | POA: Diagnosis not present

## 2017-09-27 DIAGNOSIS — I951 Orthostatic hypotension: Secondary | ICD-10-CM | POA: Diagnosis not present

## 2017-09-27 DIAGNOSIS — R0789 Other chest pain: Secondary | ICD-10-CM | POA: Diagnosis not present

## 2017-09-27 DIAGNOSIS — R5383 Other fatigue: Secondary | ICD-10-CM

## 2017-09-27 DIAGNOSIS — N200 Calculus of kidney: Secondary | ICD-10-CM | POA: Diagnosis not present

## 2017-09-27 MED ORDER — SERTRALINE HCL 50 MG PO TABS: 50.0000 mg | ORAL_TABLET | Freq: Every day | ORAL | 3 refills | Status: DC

## 2017-09-27 MED ORDER — CLONAZEPAM 0.5 MG PO TABS: 0.2500 mg | ORAL_TABLET | Freq: Two times a day (BID) | ORAL | 0 refills | Status: DC | PRN

## 2017-09-27 NOTE — Patient Instructions (Signed)
Nice to see you. We will check a TSH today. We will start you on Zoloft for anxiety.  We will give you a small course of Klonopin to take for your panic attacks as needed.  Do not take it more than twice a day.  If it makes you drowsy please do not drive or operate machinery. If you develop chest pressure, worsening symptoms, depression, thoughts of harming yourself, or any new or changing symptoms please seek medical attention immediately.

## 2017-09-28 DIAGNOSIS — F419 Anxiety disorder, unspecified: Secondary | ICD-10-CM | POA: Insufficient documentation

## 2017-09-28 DIAGNOSIS — R0789 Other chest pain: Secondary | ICD-10-CM | POA: Insufficient documentation

## 2017-09-28 LAB — TSH: TSH: 3.19 mIU/L (ref 0.40–4.50)

## 2017-09-28 NOTE — Assessment & Plan Note (Signed)
Episode most consistent with musculoskeletal cause.  Advised to monitor for cardiac type chest pain.  If that occurs he should go to the emergency department.

## 2017-09-28 NOTE — Assessment & Plan Note (Addendum)
Patient with fairly significant anxiety and panic attacks.  I suspect this is contributing to a number of the symptoms he has been having.  His testosterone supplementation could be contributing to worsening anxiety as well.  He will need to discuss that with the prescribing physicians.  Discussed treatment options.  We opted for Zoloft as a daily medication.  Given fairly consistent panic attacks we will provide him with a small course of clonazepam.  I did discuss the risk of addiction and drowsiness with this medication.  He voiced understanding.  He declined therapy referral.  We will see him back in 2 months.  Given return precautions.

## 2017-09-28 NOTE — Assessment & Plan Note (Signed)
Has been evaluated by ophthalmology.  No acute changes.  He will continue to see them.

## 2017-09-28 NOTE — Assessment & Plan Note (Signed)
Orthostatics today were negative.  Unfortunately the paper that these were written down on was not given to me by the CMA and thus the actual numbers could not be put into the chart though I did observe them and he was not orthostatic.  I wonder if his lightheaded sensation would be related to his panic attacks at this time.  Previously has been orthostatic.  He will monitor for now.

## 2017-09-28 NOTE — Assessment & Plan Note (Signed)
Recent treatment through urology.  He will continue to follow with them.

## 2017-09-28 NOTE — Progress Notes (Signed)
Marc AlarEric Courtney Bellizzi, MD Phone: [phone]  Essie HartFrank Sze is a 72 y.o. male who presents today for a same-day visit.  Patient reports long history of issues with anxiety and panic attacks as well as claustrophobia.  He notes he had been doing a fairly good job controlling them himself until the last several weeks.  He notes he has panic attacks a couple times a day where he will get shaky and very anxious and then will snap.  No impending sense of doom.  At night he feels smothered when laying under the covers.  He will wake up in a panic and feel claustrophobic and have to get up and walk around.  He notes no depression.  He notes when he has these issues he feels as though his vision goes in and out at times though he saw his ophthalmologist and they advised him that his eyes were okay with exception of cataracts.  He did recently undergo surgical intervention for a kidney stone though his current anxiety and panic symptoms were occurring prior to that.  He had a stent placed for the stone and that was removed yesterday.  He is doing well with regards to this.  He had some muscular chest pain with some soreness in a very pinpoint area in his right chest within the last day or 2.  He noted no shortness of breath.  No exertional component.  Occurred while he was lying down.  He has had no chest pressure.  He has had some intermittent lightheadedness which has been a chronic and ongoing issue that had improved somewhat.  He feels as though he is not sleeping well related to all of this.  He takes a nap in the afternoon.  He notes no SI.  GAD 7 : Generalized Anxiety Score 09/28/2017  Nervous, Anxious, on Edge 3  Control/stop worrying 1  Worry too much - different things 1  Trouble relaxing 3  Restless 2  Easily annoyed or irritable 2  Afraid - awful might happen 1  Total GAD 7 Score 13   Depression screen [facility] 2/9 09/28/2017 11/19/2016 02/10/2016  Decreased Interest 1 0 0  Down, Depressed, Hopeless 0 0 0    PHQ - 2 Score 1 0 0  Altered sleeping - 0 -  Tired, decreased energy - 0 -  Change in appetite - 0 -  Feeling bad or failure about yourself  - 0 -  Trouble concentrating - 0 -  Moving slowly or fidgety/restless - 0 -  Suicidal thoughts - 0 -  PHQ-9 Score - 0 -  Difficult doing work/chores - - -    Social History   Tobacco Use  Smoking Status Former Smoker  . Packs/day: 2.00  . Years: 40.00  . Pack years: 80.00  . Types: Cigarettes  . Last attempt to quit: 10/17/1992  . Years since quitting: 24.9  Smokeless Tobacco Never Used  Tobacco Comment   quit 1994     ROS see history of present illness  Objective  Physical Exam Vitals:   09/27/17 1516  BP: 102/64  Pulse: 70  Temp: (!) 97.4 F (36.3 C)  SpO2: 96%    BP Readings from Last 3 Encounters:  09/27/17 102/64  09/23/17 (!) 165/92  09/17/17 131/78   Wt Readings from Last 3 Encounters:  09/27/17 208 lb 12.8 oz (94.7 kg)  09/23/17 210 lb (95.3 kg)  09/17/17 210 lb (95.3 kg)    Physical Exam  Constitutional: No distress.  Cardiovascular: Normal  rate, regular rhythm and normal heart sounds.  Pulmonary/Chest: Effort normal and breath sounds normal. He exhibits no tenderness.  Musculoskeletal: He exhibits no edema.  Neurological: He is alert. Gait normal.  Skin: Skin is warm and dry. He is not diaphoretic.  Appears anxious   Assessment/Plan: Please see individual problem list.  Anxiety Patient with fairly significant anxiety and panic attacks.  I suspect this is contributing to a number of the symptoms he has been having.  His testosterone supplementation could be contributing to worsening anxiety as well.  He will need to discuss that with the prescribing physicians.  Discussed treatment options.  We opted for Zoloft as a daily medication.  Given fairly consistent panic attacks we will provide him with a small course of clonazepam.  I did discuss the risk of addiction and drowsiness with this medication.  He  voiced understanding.  He declined therapy referral.  We will see him back in 2 months.  Given return precautions.  Orthostatic hypotension Orthostatics today were negative.  Unfortunately the paper that these were written down on was not given to me by the CMA and thus the actual numbers could not be put into the chart though I did observe them and he was not orthostatic.  I wonder if his lightheaded sensation would be related to his panic attacks at this time.  Previously has been orthostatic.  He will monitor for now.  Nephrolithiasis Recent treatment through urology.  He will continue to follow with them.  Musculoskeletal chest pain Episode most consistent with musculoskeletal cause.  Advised to monitor for cardiac type chest pain.  If that occurs he should go to the emergency department.  Vision changes Has been evaluated by ophthalmology.  No acute changes.  He will continue to see them.   Orders Placed This Encounter  Procedures  . TSH    Meds ordered this encounter  Medications  . sertraline (ZOLOFT) 50 MG tablet    Sig: Take 1 tablet (50 mg total) by mouth daily.    Dispense:  30 tablet    Refill:  3  . clonazePAM (KLONOPIN) 0.5 MG tablet    Sig: Take 0.5 tablets (0.25 mg total) by mouth 2 (two) times daily as needed for anxiety.    Dispense:  10 tablet    Refill:  0     Marc Alar, MD [facility]

## 2017-09-30 NOTE — Progress Notes (Signed)
Patient notified and states he is scheduled with him tomorrow and will mention this.

## 2017-10-01 ENCOUNTER — Ambulatory Visit: Payer: Self-pay | Admitting: *Deleted

## 2017-10-01 ENCOUNTER — Telehealth: Payer: Self-pay | Admitting: Family Medicine

## 2017-10-01 NOTE — Telephone Encounter (Signed)
noted 

## 2017-10-01 NOTE — Telephone Encounter (Signed)
I will forward to the lab to see if we can add a magnesium though I do not think will necessarily be able to add this at this time.  Certainly may have been urgent care would be an option if he is refusing to go to the ED.  Thanks.

## 2017-10-01 NOTE — Telephone Encounter (Signed)
Unable to add after 24 hours

## 2017-10-01 NOTE — Telephone Encounter (Signed)
Please follow-up with the patient tomorrow to see if he got evaluated.  Please also let the patient know that wecould not add the magnesium test that his wife asked about.

## 2017-10-01 NOTE — Telephone Encounter (Signed)
Wife called in for husband saying he woke up this morning c/o dizziness (which has been going on for months per wife) and a pressure in the back of his head and down his neck.  They saw Dr. Birdie SonsSonnenberg on Friday with the dizziness.   He has not felt like going to work, eating or doing anything.   He is very fatigued.  The wife said he keeps saying" I'm so aggravated because something is wrong with me".   "My body is just not right".     He had a kidney stone removed 2 weeks ago..   She asked if this could be from that.   He is also getting testosterone shots from the urologist.   He is due one today but he wants to stop them because they are not helping him plus he said he don't have the energy to go get the shot at the urologist's office.   The wife mentioned she is grasping for straws with what is wrong with him.   I answered her question regarding what to look for if he is having a stroke.   I went over the s/s with her.   She has a call into the urologist's office regarding the testosterone and stopping it.   She wondered if he was going through testosterone withdrawal though he is not behind in his shots.   He is due today.   I reassured her  about this.   She mentioned he has gotten so bad that she has quit work because she can't leave him alone.   He denies being suicidal when wife asks him about it.   She does not think he is depressed just aggravated.  The protocol is to send him to the ED but being that these are ongoing symptoms and the wife said he would not go to the ED anyway he has already been seen in the ED twice.    I have routed a high priority note to Dr. Purvis SheffieldSonnenberg's nurse pool making them aware of the situation.  The wife is going to contact the urologist's office again this morning.  She called yesterday afternoon and left a message for him to call.     Reason for Disposition . SEVERE dizziness (e.g., unable to stand, requires support to walk, feels like passing out  now)  Answer Assessment - Initial Assessment Questions 1. DESCRIPTION: "Describe your dizziness."     Wife calling in for husband.   He has had this dizziness for months.   They can't find out what's going on.   Saw Dr. Birdie SonsSonnenberg on Friday.    They think it's his anxiety.   Only took 1/2 Klonopin on Friday.   He does not feel like doing anything.   Not sleeping.   I'm aggravated because my body does not feel right. 2. LIGHTHEADED: "Do you feel lightheaded?" (e.g., somewhat faint, woozy, weak upon standing)     This morning is different.   Back of his head and down the back of his neck is a pressure.   Very dizzy and lightheaded.   He is unsteady when he walks.   He has to hold onto things to ambulate.  Not fallen.  3. VERTIGO: "Do you feel like either you or the room is spinning or tilting?" (i.e. vertigo)     Light headed not spinning.   "Like in a cloud" 4. SEVERITY: "How bad is it?"  "Do you feel like you are going to faint?" "  Can you stand and walk?"   - MILD - walking normally   - MODERATE - interferes with normal activities (e.g., work, school)    - SEVERE - unable to stand, requires support to walk, feels like passing out now.      Having trouble walking around.    5. ONSET:  "When did the dizziness begin?"     This has been going on for months but this morning it's worse. 6. AGGRAVATING FACTORS: "Does anything make it worse?" (e.g., standing, change in head position)     Dizzy at night.   It wakes him up at night.  Dizzy even when laying down.  He does not want to get out and go or eat. 7. HEART RATE: "Can you tell me your heart rate?" "How many beats in 15 seconds?"  (Note: not all patients can do this)      Had  A CABG 2 yrs ago. 8. CAUSE: "What do you think is causing the dizziness?"     I'm concerned about his having a stroke or heart problems.    He gets testosterone shots.   Takes gabapentin for Neurontin  for a long time. 9. RECURRENT SYMPTOM: "Have you had dizziness before?" If  so, ask: "When was the last time?" "What happened that time?"     Yes.   For a long time per wife.   Wondering if it's from the testosterone shots. 10. OTHER SYMPTOMS: "Do you have any other symptoms?" (e.g., fever, chest pain, vomiting, diarrhea, bleeding)       No appetite or energy.   He has been feeling like this for a while.    11. PREGNANCY: "Is there any chance you are pregnant?" "When was your last menstrual period?"       N/A  Protocols used: DIZZINESS [facility]

## 2017-10-01 NOTE — Telephone Encounter (Signed)
Based on review of the note it appears his lightheadedness today is different than prior.  He is having headache and pain down the back of his neck.  Unsteady when he walks as well.  Those things were not going on last week when I saw him.  Given that those things are different he should be evaluated.  I would suggest ED evaluation as he likely needs a scan of his head.  Thanks.

## 2017-10-01 NOTE — Telephone Encounter (Signed)
Patients wife notified and states he is refusing to go to the ER. She was wondering if we can add a magnesium lab to his recent lab draw. Patients wife states she will try her best to convince the patient to go to the ER. Informed her if he is absolutely refusing to go to the ER he could try [facility]. They do have the ability to do CT scans and MRIs if needed.She states she will try.

## 2017-10-01 NOTE — Telephone Encounter (Signed)
fyi

## 2017-10-01 NOTE — Telephone Encounter (Signed)
[facility] [name] sure [facility] got my high priority note.   They did.   They will see that Dr. Birdie SonsSonnenberg is updated on the wife's concerns from her phone call this morning.

## 2017-10-02 ENCOUNTER — Emergency Department: Payer: Medicare Other

## 2017-10-02 ENCOUNTER — Telehealth: Payer: Self-pay

## 2017-10-02 ENCOUNTER — Observation Stay
Admission: EM | Admit: 2017-10-02 | Discharge: 2017-10-04 | Disposition: A | Discharge status: Home / Self Care | Payer: Medicare Other | Attending: Internal Medicine | Admitting: Internal Medicine

## 2017-10-02 ENCOUNTER — Other Ambulatory Visit: Payer: Self-pay

## 2017-10-02 DIAGNOSIS — F419 Anxiety disorder, unspecified: Secondary | ICD-10-CM | POA: Insufficient documentation

## 2017-10-02 DIAGNOSIS — F329 Major depressive disorder, single episode, unspecified: Secondary | ICD-10-CM | POA: Insufficient documentation

## 2017-10-02 DIAGNOSIS — I480 Paroxysmal atrial fibrillation: Secondary | ICD-10-CM | POA: Diagnosis not present

## 2017-10-02 DIAGNOSIS — R42 Dizziness and giddiness: Secondary | ICD-10-CM | POA: Diagnosis not present

## 2017-10-02 DIAGNOSIS — R531 Weakness: Secondary | ICD-10-CM | POA: Insufficient documentation

## 2017-10-02 DIAGNOSIS — I491 Atrial premature depolarization: Secondary | ICD-10-CM | POA: Diagnosis not present

## 2017-10-02 DIAGNOSIS — Z951 Presence of aortocoronary bypass graft: Secondary | ICD-10-CM | POA: Insufficient documentation

## 2017-10-02 DIAGNOSIS — K4020 Bilateral inguinal hernia, without obstruction or gangrene, not specified as recurrent: Secondary | ICD-10-CM | POA: Insufficient documentation

## 2017-10-02 DIAGNOSIS — I951 Orthostatic hypotension: Principal | ICD-10-CM | POA: Insufficient documentation

## 2017-10-02 DIAGNOSIS — R51 Headache: Secondary | ICD-10-CM | POA: Insufficient documentation

## 2017-10-02 DIAGNOSIS — I129 Hypertensive chronic kidney disease with stage 1 through stage 4 chronic kidney disease, or unspecified chronic kidney disease: Secondary | ICD-10-CM | POA: Diagnosis not present

## 2017-10-02 DIAGNOSIS — I861 Scrotal varices: Secondary | ICD-10-CM | POA: Insufficient documentation

## 2017-10-02 DIAGNOSIS — N132 Hydronephrosis with renal and ureteral calculous obstruction: Secondary | ICD-10-CM | POA: Insufficient documentation

## 2017-10-02 DIAGNOSIS — I739 Peripheral vascular disease, unspecified: Secondary | ICD-10-CM | POA: Diagnosis not present

## 2017-10-02 DIAGNOSIS — I25119 Atherosclerotic heart disease of native coronary artery with unspecified angina pectoris: Secondary | ICD-10-CM | POA: Diagnosis not present

## 2017-10-02 DIAGNOSIS — E042 Nontoxic multinodular goiter: Secondary | ICD-10-CM | POA: Insufficient documentation

## 2017-10-02 DIAGNOSIS — I700 Atherosclerosis of aorta: Secondary | ICD-10-CM | POA: Insufficient documentation

## 2017-10-02 DIAGNOSIS — I701 Atherosclerosis of renal artery: Secondary | ICD-10-CM | POA: Diagnosis not present

## 2017-10-02 DIAGNOSIS — J449 Chronic obstructive pulmonary disease, unspecified: Secondary | ICD-10-CM | POA: Diagnosis not present

## 2017-10-02 DIAGNOSIS — Z87442 Personal history of urinary calculi: Secondary | ICD-10-CM | POA: Insufficient documentation

## 2017-10-02 DIAGNOSIS — I493 Ventricular premature depolarization: Secondary | ICD-10-CM | POA: Diagnosis not present

## 2017-10-02 DIAGNOSIS — N189 Chronic kidney disease, unspecified: Secondary | ICD-10-CM | POA: Insufficient documentation

## 2017-10-02 DIAGNOSIS — I482 Chronic atrial fibrillation: Secondary | ICD-10-CM | POA: Diagnosis not present

## 2017-10-02 DIAGNOSIS — M1990 Unspecified osteoarthritis, unspecified site: Secondary | ICD-10-CM | POA: Diagnosis not present

## 2017-10-02 DIAGNOSIS — Z7902 Long term (current) use of antithrombotics/antiplatelets: Secondary | ICD-10-CM | POA: Insufficient documentation

## 2017-10-02 DIAGNOSIS — Z79899 Other long term (current) drug therapy: Secondary | ICD-10-CM | POA: Insufficient documentation

## 2017-10-02 DIAGNOSIS — Z87891 Personal history of nicotine dependence: Secondary | ICD-10-CM | POA: Insufficient documentation

## 2017-10-02 DIAGNOSIS — I2510 Atherosclerotic heart disease of native coronary artery without angina pectoris: Secondary | ICD-10-CM | POA: Diagnosis present

## 2017-10-02 DIAGNOSIS — N401 Enlarged prostate with lower urinary tract symptoms: Secondary | ICD-10-CM | POA: Insufficient documentation

## 2017-10-02 DIAGNOSIS — E785 Hyperlipidemia, unspecified: Secondary | ICD-10-CM | POA: Insufficient documentation

## 2017-10-02 DIAGNOSIS — K5730 Diverticulosis of large intestine without perforation or abscess without bleeding: Secondary | ICD-10-CM | POA: Insufficient documentation

## 2017-10-02 DIAGNOSIS — K449 Diaphragmatic hernia without obstruction or gangrene: Secondary | ICD-10-CM | POA: Diagnosis not present

## 2017-10-02 DIAGNOSIS — I10 Essential (primary) hypertension: Secondary | ICD-10-CM | POA: Diagnosis present

## 2017-10-02 LAB — URINALYSIS, COMPLETE (UACMP) WITH MICROSCOPIC
Bacteria, UA: NONE SEEN
Bilirubin Urine: NEGATIVE
Glucose, UA: NEGATIVE mg/dL
Hgb urine dipstick: NEGATIVE
Ketones, ur: NEGATIVE mg/dL
Leukocytes, UA: NEGATIVE
Nitrite: NEGATIVE
Protein, ur: NEGATIVE mg/dL
Specific Gravity, Urine: 1.005 (ref 1.005–1.030)
Squamous Epithelial / LPF: NONE SEEN
pH: 5 (ref 5.0–8.0)

## 2017-10-02 LAB — CBC
HCT: 51 % (ref 40.0–52.0)
Hemoglobin: 16.6 g/dL (ref 13.0–18.0)
MCH: 30 pg (ref 26.0–34.0)
MCHC: 32.5 g/dL (ref 32.0–36.0)
MCV: 92.1 fL (ref 80.0–100.0)
Platelets: 174 10*3/uL (ref 150–440)
RBC: 5.54 MIL/uL (ref 4.40–5.90)
RDW: 14.2 % (ref 11.5–14.5)
WBC: 10.1 10*3/uL (ref 3.8–10.6)

## 2017-10-02 LAB — GLUCOSE, CAPILLARY: Glucose-Capillary: 115 mg/dL — ABNORMAL HIGH (ref 65–99)

## 2017-10-02 LAB — BASIC METABOLIC PANEL
Anion gap: 10 (ref 5–15)
BUN: 24 mg/dL — ABNORMAL HIGH (ref 6–20)
CO2: 20 mmol/L — ABNORMAL LOW (ref 22–32)
Calcium: 8.2 mg/dL — ABNORMAL LOW (ref 8.9–10.3)
Chloride: 105 mmol/L (ref 101–111)
Creatinine, Ser: 1.31 mg/dL — ABNORMAL HIGH (ref 0.61–1.24)
GFR calc Af Amer: >60 mL/min (ref >60)
GFR calc non Af Amer: 52 mL/min — ABNORMAL LOW (ref >60)
Glucose, Bld: 117 mg/dL — ABNORMAL HIGH (ref 65–99)
Potassium: 4.2 mmol/L (ref 3.5–5.1)
Sodium: 135 mmol/L (ref 135–145)

## 2017-10-02 LAB — TROPONIN I: Troponin I: <0.03 ng/mL (ref <0.03)

## 2017-10-02 MED ORDER — MECLIZINE HCL 25 MG PO TABS
25.0000 mg | ORAL_TABLET | Freq: Once | ORAL | Status: AC
  Administered 2017-10-02: 25 mg via ORAL
  Filled 2017-10-02: qty 1

## 2017-10-02 MED ORDER — SODIUM CHLORIDE 0.9 % IV SOLN
Freq: Once | INTRAVENOUS | Status: AC
  Administered 2017-10-02: via INTRAVENOUS

## 2017-10-02 MED ORDER — LORAZEPAM 2 MG/ML IJ SOLN: 1.0000 mg | Freq: Once | INTRAMUSCULAR | Status: DC

## 2017-10-02 MED ORDER — SODIUM CHLORIDE 0.9 % IV BOLUS (SEPSIS)
1000.0000 mL | Freq: Once | INTRAVENOUS | Status: AC
  Administered 2017-10-02: 1000 mL via INTRAVENOUS

## 2017-10-02 MED ORDER — LORAZEPAM 1 MG PO TABS
1.0000 mg | ORAL_TABLET | Freq: Once | ORAL | Status: AC
  Administered 2017-10-02: 1 mg via ORAL
  Filled 2017-10-02: qty 1

## 2017-10-02 NOTE — Telephone Encounter (Signed)
Can you follow-up with the patients wife to see if he is going to get evaluated at the ED given his symptoms? If he is not going please reinforce that he needs evaluation at the ED for his symptoms. Thanks.

## 2017-10-02 NOTE — Telephone Encounter (Signed)
Spoke with patients wife informed her I did not tell him he needed an MRI. Patient is experiencing dizziness, anxiety and shakiness. Informed patient he needs to be evaluated as stated in other encounter message. Patient expressed concern of having an mri done and being claustrophobic. Informed patient he may or may not need imaging but he needs to be evaluated for symptoms.patient stated he would go to urgent care for evaluation. Patients wife called back and stated the patient informed her he was told to get an MRI.Informed her that is not correct and that I informed him that he will need to be evaluated. Patients wife states he is refusing to go but he is shaking and worsening. Informed her she will need to call EMS, she states she will call EMS. She verbalized understanding.

## 2017-10-02 NOTE — Telephone Encounter (Signed)
fyi

## 2017-10-02 NOTE — ED Notes (Signed)
Dr. Veronese at bedside 

## 2017-10-02 NOTE — Telephone Encounter (Signed)
[facility] now has had a preliminary ECG and labs but has not seen MD yet, spoke with patient wife his DPR.

## 2017-10-02 NOTE — ED Triage Notes (Signed)
Pt to ER via POV c/o dizziness, "being cold", "rapid pulse" for longer than one month. Pt alert and oriented X4, active, cooperative, pt in NAD. RR even and unlabored, color WNL.

## 2017-10-02 NOTE — ED Notes (Signed)
Maxine GlennDonna Vinsant (wife) [phone]

## 2017-10-02 NOTE — Telephone Encounter (Signed)
FYI Copied from CRM [phone]. Topic: Inquiry >> Oct 02, 2017  1:13 PM Alexander BergeronBarksdale, Harvey B wrote: Reason for CRM: pt called to [facility] but [place], contact pt if needed, pt wants nurse of pcp to know

## 2017-10-02 NOTE — ED Provider Notes (Signed)
-----------------------------------------   11:16 PM on 10/02/2017 -----------------------------------------  After 2 L of fluid patient continues to be significantly orthostatic.  He does state that when he stood up he still felt wobbly.  Given continued orthostasis and symptoms will plan on admission to the hospital service.  I discussed this with the patient in the hospitalist.   Phineas SemenGoodman, Kapono Luhn, MD 10/02/17 2316

## 2017-10-02 NOTE — Telephone Encounter (Signed)
Patient is needing an order for MRI , [facility] and was told he needed to have an order put in by his PCP.

## 2017-10-02 NOTE — ED Notes (Addendum)
Pt states he has been having lots of anxiety and dizziness the past few weeks. He has also been having HA which are a new symptom for him. Wife states pt is very fatigue and his eating habits have decreased. Pt has Hx of open heart surgery two years ago and Hx HTN. Pt also reports having the shakes. Pt also has Hx of A-fib

## 2017-10-02 NOTE — ED Notes (Signed)
Pt had CBG performed today 121 at home today.

## 2017-10-02 NOTE — Telephone Encounter (Signed)
Noted  

## 2017-10-02 NOTE — ED Provider Notes (Addendum)
[facility] Emergency Department Provider Note  ____________________________________________  Time seen: Approximately 4:55 PM  I have reviewed the triage vital signs and the nursing notes.   HISTORY  Chief Complaint Near Syncope   HPI [name] is a 72 y.o. male the history of CAD status post CABG on Plavix, COPD, hypertension, hyperlipidemia, paroxysmal atrial fibrillation, peripheral vascular diseasewho presents for evaluation of dizziness. Patient reports feeling dizzy for one month. The dizziness is constant and mild but sometimes it becomes severe. He has been having difficulty walking due to the dizziness. He describes it as feeling like he is on a moving boat and not room spinning. He denies feeling lightheaded. He also reports a constant mild/moderate pressure in the occipital region for several weeks. No fall or head trauma. No CP, SOB, URI sx, fever, N/V, no facial droop, slurred speech, unilateral weakness of numbness.    Past Medical History:  Diagnosis Date  . Arthritis   . CAD, multiple vessel 03/2105   LM 65%, mLAD 70%, 90% & 80%, o-pCx 75%, mCx 95% & d Cx 70%, OM1 60%, mRCA 90% with 100% dRCA. R-R collaterals  . Chronic distal aortic occlusion (HCC)    By report  . Chronic kidney disease   . COPD (chronic obstructive pulmonary disease) (HCC)   . Depression   . Enlarged prostate   . Erectile dysfunction   . Essential hypertension   . History of kidney stones    multiple lithotripsies  . Hyperlipidemia LDL goal <70   . Hypogonadism in male    Previously on testosterone injections, but has not yet restarted until further testing  . Muscle pain   . PAF (paroxysmal atrial fibrillation) (HCC) 03/2015   Noted per-cath & post-op CABG.  Dr. [name], cardiology  . Peripheral vascular disease (HCC)    Bilateral ABIs 0.8  . Renal stones 03/2016  . S/P CABG x 5 04/04/2015   LIMA to LAD, SVG to DIAG, SVG to OM, seqSVG -PDA- dCx  . SOB  (shortness of breath)   . 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 Weakness    weakness bilateral legs, s/p vein harvesting for CABPG    Patient Active Problem List   Diagnosis Date Noted  . Anxiety 09/28/2017  . Musculoskeletal chest pain 09/28/2017  . Low back pain 07/23/2017  . Dizziness of unknown cause 03/22/2017  . Orthostatic hypotension 01/02/2017  . Vision changes 01/02/2017  . Near syncope 12/29/2016  . Lumbar radiculopathy 07/18/2016  . Dyspnea on exertion 02/07/2016  . Solitary pulmonary nodule 01/25/2016  . Nephrolithiasis 01/25/2016  . Multinodular goiter 01/25/2016  . Chest wall deformity 01/11/2016  . Neck swelling 01/11/2016  . COPD (chronic obstructive pulmonary disease) (HCC) 01/11/2016  . Greater trochanteric bursitis of left hip 12/28/2015  . Hoarseness of voice 11/18/2015  . Greater trochanteric bursitis of right hip 07/15/2015  . Left hip pain 07/06/2015  . Chronic fatigue 06/29/2015  . Insomnia 06/29/2015  . Absolute anemia 05/18/2015  . Dyslipidemia, goal LDL below 70 04/20/2015  . CAD, multiple vessel   . Coronary artery disease involving native coronary artery with angina pectoris (HCC)   . S/P CABG x 5 04/04/2015  . PAF (paroxysmal atrial fibrillation) (HCC) 03/17/2015  . BPH with obstruction/lower urinary tract symptoms 03/10/2015  . Hypogonadism male 03/04/2015  . Essential hypertension 12/02/2014  . PAD (peripheral artery disease) (HCC) 12/02/2014    Past Surgical History:  Procedure Laterality  Date  . BLEPHAROPLASTY    . CARDIAC CATHETERIZATION  1992  . CARDIAC CATHETERIZATION N/A 03/31/2015   Procedure: Left Heart Cath and Coronary Angiography;  Surgeon: Marykay Lex, MD;  Location: [facility] INVASIVE CV LAB: LM 65%, mLAD 70%, 90% & 80%, o-pCx 75%, mCx 95% & d Cx 70%, OM1 60%, mRCA 90% with 100% dRCA. R-R collaterals --> CABG referral. Also noted to be in Afib.  . Carotid Dopplers  12/30/2016   Patent  vertebrals. Mild bilateral carotid plaque, <50% stenosis bilaterally.  . CHOLECYSTECTOMY  2000   also had revision of hernia repair  . CORONARY ARTERY BYPASS GRAFT N/A 04/04/2015   Procedure: CORONARY ARTERY BYPASS GRAFTING (CABG);  Surgeon: [name], MD;  Location: [facility] OR;  Service: Open Heart Surgery;  Laterality: N/A;  . CYSTECTOMY     face and scrotum  . CYSTOSCOPY WITH INSERTION OF UROLIFT N/A 11/12/2016   Procedure: CYSTOSCOPY WITH INSERTION OF UROLIFT x4;  Surgeon: Jethro Bolus, MD;  Location: [facility] ORS;  Service: Urology;  Laterality: N/A;  . CYSTOSCOPY WITH RETROGRADE PYELOGRAM, URETEROSCOPY AND STENT PLACEMENT Right 06/26/2016   Procedure: CYSTOSCOPY WITH RIGHT RETROGRADE PYELOGRAM, RIGHT URETEROSCOPY , HOLMIUM LASER, BASKET EXTRACTION AND STENT PLACEMENT;  Surgeon: Jethro Bolus, MD;  Location: [facility] ORS;  Service: Urology;  Laterality: Right;  . CYSTOSCOPY WITH RETROGRADE PYELOGRAM, URETEROSCOPY AND STENT PLACEMENT Left 09/23/2017   Procedure: CYSTOSCOPY WITH LEFT RETROGRADE PYELOGRAM, URETEROSCOPY STONE EXTRACTION AND STENT PLACEMENT;  Surgeon: Crista Elliot, MD;  Location: [facility] ORS;  Service: Urology;  Laterality: Left;  . Lexiscan Myoview  June 2010   converted to [facility] from treadmill due to inability to reach target heart rate) --the study was normal with no evidence of ischemia and normal LV function. EF 7  . LITHOTRIPSY    . TEE WITHOUT CARDIOVERSION N/A 04/04/2015   Procedure: TRANSESOPHAGEAL ECHOCARDIOGRAM (TEE);  Surgeon: [name], MD;  Location: [facility] OR;  Service: Open Heart Surgery;  Laterality: N/A;  . TRANSTHORACIC ECHOCARDIOGRAM  12/30/2016   EF 60-65%. GR 1 DD. Mildly dilated ascending aorta. Otherwise essentially normal with no valve lesions.  Marland Kitchen VASECTOMY  1975  . VENTRAL HERNIA REPAIR  1994   complicated by infection    Prior to Admission medications   Medication Sig Start Date End Date Taking? Authorizing Provider  acetaminophen  (TYLENOL) 650 MG CR tablet Take 650 mg by mouth every 8 (eight) hours as needed for pain. Reported on 01/03/2016    [provider]  atorvastatin (LIPITOR) 40 MG tablet Take 1 tablet (40 mg total) by mouth daily. 04/18/17   Glori Luis, MD  clonazePAM (KLONOPIN) 0.5 MG tablet Take 0.5 tablets (0.25 mg total) by mouth 2 (two) times daily as needed for anxiety. 09/27/17   Glori Luis, MD  clopidogrel (PLAVIX) 75 MG tablet Take 1 tablet (75 mg total) by mouth daily. 08/03/16   Glori Luis, MD  diphenhydramine-acetaminophen (TYLENOL PM) 25-500 MG TABS tablet Take 1 tablet by mouth at bedtime as needed (sleep).    [provider]  finasteride (PROSCAR) 5 MG tablet Take 1 tablet (5 mg total) by mouth daily. 09/16/17   Glori Luis, MD  gabapentin (NEURONTIN) 100 MG capsule TAKE ONE CAPSULE BY MOUTH TWICE A DAY 05/14/17   Judi Saa, DO  metoprolol tartrate (LOPRESSOR) 25 MG tablet TAKE 1/2 TABLET TWICE DAILY 09/13/17   Glori Luis, MD  NITROSTAT 0.4 MG SL tablet PLACE 1 TABLET (0.4 MG  TOTAL) UNDER THE TONGUE EVERY 5 (FIVE) MINUTES AS NEEDED FOR CHEST PAIN. 01/01/17   Marykay Lex, MD  sertraline (ZOLOFT) 50 MG tablet Take 1 tablet (50 mg total) by mouth daily. 09/27/17   Glori Luis, MD  tamsulosin (FLOMAX) 0.4 MG CAPS capsule Take 0.4 mg by mouth daily.  05/02/16   [provider]  testosterone cypionate (DEPOTESTOSTERONE CYPIONATE) 200 MG/ML injection Inject 100 mg into the muscle every 14 (fourteen) days.  10/24/16   [provider]    Allergies Patient has no known allergies.  Family History  Problem Relation Age of Onset  . Coronary artery disease Father   . Heart disease Father   . Stroke Mother   . Hearing loss Mother   . Stroke Sister   . Hearing loss Maternal Grandmother   . Prostate cancer Neg Hx   . Kidney disease Neg Hx   . Other Neg Hx        hypogonadism    Social History Social History   Tobacco Use   . Smoking status: Former Smoker    Packs/day: 2.00    Years: 40.00    Pack years: 80.00    Types: Cigarettes    Last attempt to quit: 10/17/1992    Years since quitting: 24.9  . Smokeless tobacco: Never Used  . Tobacco comment: quit 1994  Substance Use Topics  . Alcohol use: Yes    Alcohol/week: 0.6 oz    Types: 1 Standard drinks or equivalent per week    Comment: seldom - vodka or wine  . Drug use: No    Review of Systems  Constitutional: Negative for fever. + dizziness Eyes: Negative for visual changes. ENT: Negative for sore throat. Neck: No neck pain  Cardiovascular: Negative for chest pain. Respiratory: Negative for shortness of breath. Gastrointestinal: Negative for abdominal pain, vomiting or diarrhea. Genitourinary: Negative for dysuria. Musculoskeletal: Negative for back pain. Skin: Negative for rash. Neurological: Negative for headaches, weakness or numbness. + occipital pressure Psych: No SI or HI  ____________________________________________   PHYSICAL EXAM:  VITAL SIGNS: ED Triage Vitals  Enc Vitals Group     BP 10/02/17 1253 111/60     Pulse Rate 10/02/17 1253 74     Resp 10/02/17 1253 16     Temp 10/02/17 1253 (!) 97.4 F (36.3 C)     Temp Source 10/02/17 1253 Oral     SpO2 10/02/17 1253 99 %     Weight 10/02/17 1246 210 lb (95.3 kg)     Height 10/02/17 1246 5\' 10"  (1.778 m)     Head Circumference --      Peak Flow --      Pain Score 10/02/17 1253 0     Pain Loc --      Pain Edu? --      Excl. in GC? --     Constitutional: Alert and oriented. Well appearing and in no apparent distress. HEENT:      Head: Normocephalic and atraumatic.         Eyes: Conjunctivae are normal. Sclera is non-icteric.       Mouth/Throat: Mucous membranes are moist.       Neck: Supple with no signs of meningismus. No bruits Cardiovascular: Regular rate and rhythm. No murmurs, gallops, or rubs. 2+ symmetrical distal pulses are present in all extremities. No  JVD. Respiratory: Normal respiratory effort. Lungs are clear to auscultation bilaterally. No wheezes, crackles, or rhonchi.  Gastrointestinal: Soft, non tender, and  non distended with positive bowel sounds. No rebound or guarding. Genitourinary: No CVA tenderness. Musculoskeletal: Nontender with normal range of motion in all extremities. No edema, cyanosis, or erythema of extremities. Neurologic: Normal speech and language. A & O x3, PERRL, EOMI, no nystagmus, CN II-XII intact, motor testing reveals good tone and bulk throughout. There is no evidence of pronator drift or dysmetria. Muscle strength is 5/5 throughout.  Sensory examination is intact. Gait is normal. Skin: Skin is warm, dry and intact. No rash noted. Psychiatric: Mood and affect are normal. Speech and behavior are normal.  ____________________________________________   LABS (all labs ordered are listed, but only abnormal results are displayed)  Labs Reviewed  BASIC METABOLIC PANEL - Abnormal; Notable for the following components:      Result Value   CO2 20 (*)    Glucose, Bld 117 (*)    BUN 24 (*)    Creatinine, Ser 1.31 (*)    Calcium 8.2 (*)    GFR calc non Af Amer 52 (*)    All other components within normal limits  URINALYSIS, COMPLETE (UACMP) WITH MICROSCOPIC - Abnormal; Notable for the following components:   Color, Urine YELLOW (*)    APPearance CLEAR (*)    All other components within normal limits  GLUCOSE, CAPILLARY - Abnormal; Notable for the following components:   Glucose-Capillary 115 (*)    All other components within normal limits  CBC  TROPONIN I  CBG MONITORING, ED   ____________________________________________  EKG  ED ECG REPORT I, Nita Sickle, the attending physician, personally viewed and interpreted this ECG.  normal sinus rhythm, occasional PVCs, rate of 77, normal intervals, normal axis, no ST elevations or depressions. Unchanged from prior from 2 weeks  ago ____________________________________________  RADIOLOGY  I have personally reviewed the images performed during this visit and I agree with the Radiologist's read.   Interpretation by Radiologist:  Ct Head Wo Contrast  Result Date: 10/02/2017 CLINICAL DATA:  Dizziness and anxiety for the past few weeks, headaches EXAM: CT HEAD WITHOUT CONTRAST TECHNIQUE: Contiguous axial images were obtained from the base of the skull through the vertex without intravenous contrast. Sagittal and coronal MPR images reconstructed from axial data set. COMPARISON:  12/30/2016 FINDINGS: Brain: Cavum septum pellucidum. Otherwise normal ventricular morphology. No midline shift or mass effect. Minimal small vessel chronic ischemic changes of deep cerebral white matter. No intracranial hemorrhage, mass lesion, or evidence of acute infarction. No extra-axial fluid collections. Vascular: Atherosclerotic calcification of internal carotid and vertebral arteries at skull base Skull: Intact Sinuses/Orbits: Clear Other: N/A IMPRESSION: Atrophy with minimal small vessel chronic ischemic changes of deep cerebral white matter. No acute intracranial abnormalities. Electronically Signed   By: Ulyses Southward M.D.   On: 10/02/2017 16:45     ____________________________________________   PROCEDURES  Procedure(s) performed: None Procedures Critical Care performed:  None ____________________________________________   INITIAL IMPRESSION / ASSESSMENT AND PLAN / ED COURSE   72 y.o. male the history of CAD status post CABG on Plavix, COPD, hypertension, hyperlipidemia, paroxysmal atrial fibrillation, peripheral vascular diseasewho presents for evaluation of dizziness x 1 month. patient is well-appearing, in no distress, has normal vital signs, he is completely neurologically intact with normal gait. CT head with no evidence of head bleed or stroke. Will get MRI to rule out posterior fossa stroke. Orthostatic vital signs are positive  and patient reports feeling dizzy when he stands up. Will give IVF. Will give meclizine. EKG with no evidence of ischemia or dysrhythmia. We'll  also get urinalysis to rule out dehydration and urinary tract infection.  Clinical Course as of Oct 02 2016  Wed Oct 02, 2017  2006 Patient refused MRI. At this time I have low suspicion for intracranial pathology however discussed with patient that without the MRI we are unable to determine if his symptoms are due to stroke, intracerebral vessel malformation. Repeat vitals after 500cc shows improvement of orthostasis with unchanged HR however BP still dropping  from 160 to 110. Patient reports dizziness has resolved. Another 500cc ordered. Review of med list shows patient is on flomax and metoprolol. recommend the patient discussed with his urologist tomorrow about stopping Flomax which can be contributing to patient's orthostatic hypotension. Discussed close follow-up with primary care doctor for further evaluation and management. Discussed giving himself enough time when changing positions to avoid severe episodes of dizziness and syncope. Plan to repeat orthostatic vital signs after second 500 cc and if patient remains well appearing with no further episodes of dizziness he'll be discharged home. Care transferred to Dr. Derrill KayGoodman  [CV]    Clinical Course User Index [CV] Don PerkingVeronese, [place], MD     As part of my medical decision making, I reviewed the following data within the electronic MEDICAL RECORD NUMBER Nursing notes reviewed and incorporated, Labs reviewed , EKG interpreted , Old EKG reviewed, Old chart reviewed, Radiograph reviewed , Notes from prior ED visits and [place] Controlled Substance Database    Pertinent labs & imaging results that were available during my care of the patient were reviewed by me and considered in my medical decision making (see chart for details).    ____________________________________________   FINAL CLINICAL IMPRESSION(S) /  ED DIAGNOSES  Final diagnoses:  Orthostatic hypotension      NEW MEDICATIONS STARTED DURING THIS VISIT:  ED Discharge Orders    None       Note:  This document was prepared using Dragon voice recognition software and may include unintentional dictation errors.    Nita SickleVeronese, [place], MD 10/02/17 2011    Don PerkingVeronese, [place], MD 10/02/17 2018

## 2017-10-02 NOTE — Telephone Encounter (Signed)
Patient notified and states he will go to [facility] today.

## 2017-10-03 ENCOUNTER — Other Ambulatory Visit: Payer: Self-pay

## 2017-10-03 ENCOUNTER — Observation Stay: Payer: Medicare Other

## 2017-10-03 DIAGNOSIS — I480 Paroxysmal atrial fibrillation: Secondary | ICD-10-CM | POA: Diagnosis not present

## 2017-10-03 DIAGNOSIS — I10 Essential (primary) hypertension: Secondary | ICD-10-CM | POA: Diagnosis not present

## 2017-10-03 DIAGNOSIS — I2510 Atherosclerotic heart disease of native coronary artery without angina pectoris: Secondary | ICD-10-CM | POA: Diagnosis not present

## 2017-10-03 DIAGNOSIS — I6523 Occlusion and stenosis of bilateral carotid arteries: Secondary | ICD-10-CM | POA: Diagnosis not present

## 2017-10-03 DIAGNOSIS — I951 Orthostatic hypotension: Secondary | ICD-10-CM | POA: Diagnosis not present

## 2017-10-03 LAB — GASTROINTESTINAL PANEL BY PCR, STOOL (REPLACES STOOL CULTURE)

## 2017-10-03 LAB — BASIC METABOLIC PANEL
Anion gap: 8 (ref 5–15)
BUN: 19 mg/dL (ref 6–20)
CO2: 19 mmol/L — ABNORMAL LOW (ref 22–32)
Calcium: 7.4 mg/dL — ABNORMAL LOW (ref 8.9–10.3)
Chloride: 111 mmol/L (ref 101–111)
Creatinine, Ser: 1.13 mg/dL (ref 0.61–1.24)
GFR calc Af Amer: >60 mL/min (ref >60)
GFR calc non Af Amer: >60 mL/min (ref >60)
Glucose, Bld: 84 mg/dL (ref 65–99)
Potassium: 4 mmol/L (ref 3.5–5.1)
Sodium: 138 mmol/L (ref 135–145)

## 2017-10-03 LAB — C DIFFICILE QUICK SCREEN W PCR REFLEX
C Diff antigen: NEGATIVE
C Diff interpretation: NOT DETECTED
C Diff toxin: NEGATIVE

## 2017-10-03 LAB — CBC
HCT: 45.4 % (ref 40.0–52.0)
Hemoglobin: 14.9 g/dL (ref 13.0–18.0)
MCH: 30.1 pg (ref 26.0–34.0)
MCHC: 32.8 g/dL (ref 32.0–36.0)
MCV: 91.9 fL (ref 80.0–100.0)
Platelets: 184 10*3/uL (ref 150–440)
RBC: 4.95 MIL/uL (ref 4.40–5.90)
RDW: 14.3 % (ref 11.5–14.5)
WBC: 8.1 10*3/uL (ref 3.8–10.6)

## 2017-10-03 MED ORDER — AZITHROMYCIN 250 MG PO TABS
500.0000 mg | ORAL_TABLET | Freq: Every day | ORAL | Status: DC
  Administered 2017-10-03 – 2017-10-04 (×2): 500 mg via ORAL
  Filled 2017-10-03 (×2): qty 2

## 2017-10-03 MED ORDER — ACETAMINOPHEN 325 MG PO TABS
650.0000 mg | ORAL_TABLET | Freq: Four times a day (QID) | ORAL | Status: DC | PRN
  Administered 2017-10-03 – 2017-10-04 (×2): 650 mg via ORAL
  Filled 2017-10-03 (×2): qty 2

## 2017-10-03 MED ORDER — ENOXAPARIN SODIUM 40 MG/0.4ML ~~LOC~~ SOLN
40.0000 mg | SUBCUTANEOUS | Status: DC
  Administered 2017-10-03: 40 mg via SUBCUTANEOUS
  Filled 2017-10-03: qty 0.4

## 2017-10-03 MED ORDER — METOPROLOL TARTRATE 25 MG PO TABS
12.5000 mg | ORAL_TABLET | Freq: Two times a day (BID) | ORAL | Status: DC
  Administered 2017-10-03 – 2017-10-04 (×2): 12.5 mg via ORAL
  Filled 2017-10-03 (×3): qty 1

## 2017-10-03 MED ORDER — GABAPENTIN 100 MG PO CAPS
100.0000 mg | ORAL_CAPSULE | Freq: Two times a day (BID) | ORAL | Status: DC
Start: 2017-10-03 — End: 2017-10-04
  Administered 2017-10-03 (×2): 100 mg via ORAL
  Filled 2017-10-03 (×2): qty 1

## 2017-10-03 MED ORDER — SODIUM CHLORIDE 0.9 % IV SOLN
INTRAVENOUS | Status: AC
  Administered 2017-10-03 (×2): via INTRAVENOUS

## 2017-10-03 MED ORDER — SERTRALINE HCL 50 MG PO TABS: 50.0000 mg | ORAL_TABLET | Freq: Every day | ORAL | Status: DC

## 2017-10-03 MED ORDER — TAMSULOSIN HCL 0.4 MG PO CAPS
0.4000 mg | ORAL_CAPSULE | Freq: Every day | ORAL | Status: DC
  Administered 2017-10-03: 0.4 mg via ORAL
  Filled 2017-10-03 (×2): qty 1

## 2017-10-03 MED ORDER — FINASTERIDE 5 MG PO TABS
5.0000 mg | ORAL_TABLET | Freq: Every day | ORAL | Status: DC
  Administered 2017-10-03 – 2017-10-04 (×2): 5 mg via ORAL
  Filled 2017-10-03 (×2): qty 1

## 2017-10-03 MED ORDER — ATORVASTATIN CALCIUM 20 MG PO TABS
40.0000 mg | ORAL_TABLET | Freq: Every day | ORAL | Status: DC
  Administered 2017-10-03 – 2017-10-04 (×2): 40 mg via ORAL
  Filled 2017-10-03 (×2): qty 2

## 2017-10-03 MED ORDER — CLONAZEPAM 0.125 MG PO TBDP
0.2500 mg | ORAL_TABLET | Freq: Two times a day (BID) | ORAL | Status: DC | PRN
  Administered 2017-10-03: 0.25 mg via ORAL
  Filled 2017-10-03: qty 2

## 2017-10-03 MED ORDER — ACETAMINOPHEN 650 MG RE SUPP: 650.0000 mg | Freq: Four times a day (QID) | RECTAL | Status: DC | PRN

## 2017-10-03 MED ORDER — ONDANSETRON HCL 4 MG PO TABS: 4.0000 mg | ORAL_TABLET | Freq: Four times a day (QID) | ORAL | Status: DC | PRN

## 2017-10-03 MED ORDER — ONDANSETRON HCL 4 MG/2ML IJ SOLN: 4.0000 mg | Freq: Four times a day (QID) | INTRAMUSCULAR | Status: DC | PRN

## 2017-10-03 MED ORDER — CLOPIDOGREL BISULFATE 75 MG PO TABS
75.0000 mg | ORAL_TABLET | Freq: Every day | ORAL | Status: DC
  Administered 2017-10-03 – 2017-10-04 (×2): 75 mg via ORAL
  Filled 2017-10-03 (×2): qty 1

## 2017-10-03 NOTE — H&P (Signed)
[facility] - [place] [facility]   PATIENT NAME: [name]    MR#:  [id number]  DATE OF BIRTH:  Apr 17, 1946  DATE OF ADMISSION:  10/02/2017  PRIMARY CARE PHYSICIAN: Glori Luis, MD   REQUESTING/REFERRING PHYSICIAN: Derrill Kay, MD  CHIEF COMPLAINT:   Chief Complaint  Patient presents with  . Near Syncope    HISTORY OF PRESENT ILLNESS:  [name]  is a 72 y.o. male who presents with sinus, near syncope.  Here in the ED he was found to be orthostatic.  Unclear etiology why, though he does state that he has had some GI upset with some nausea and vomiting.  He received some fluids upfront, with some improvement in symptoms but remained orthostatic on repeat checks.  Hospitalist were called for admission for hydration and follow-up.  PAST MEDICAL HISTORY:   Past Medical History:  Diagnosis Date  . Arthritis   . CAD, multiple vessel 03/2105   LM 65%, mLAD 70%, 90% & 80%, o-pCx 75%, mCx 95% & d Cx 70%, OM1 60%, mRCA 90% with 100% dRCA. R-R collaterals  . Chronic distal aortic occlusion (HCC)    By report  . Chronic kidney disease   . COPD (chronic obstructive pulmonary disease) (HCC)   . Depression   . Enlarged prostate   . Erectile dysfunction   . Essential hypertension   . History of kidney stones    multiple lithotripsies  . Hyperlipidemia LDL goal <70   . Hypogonadism in male    Previously on testosterone injections, but has not yet restarted until further testing  . Muscle pain   . PAF (paroxysmal atrial fibrillation) (HCC) 03/2015   Noted per-cath & post-op CABG.  Dr. [name], cardiology  . Peripheral vascular disease (HCC)    Bilateral ABIs 0.8  . Renal stones 03/2016  . S/P CABG x 5 04/04/2015   LIMA to LAD, SVG to DIAG, SVG to OM, seqSVG -PDA- dCx  . SOB (shortness of breath)   . 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 Weakness    weakness bilateral legs, s/p vein harvesting for CABPG      PAST SURGICAL HISTORY:   Past Surgical History:  Procedure Laterality Date  . BLEPHAROPLASTY    . CARDIAC CATHETERIZATION  1992  . CARDIAC CATHETERIZATION N/A 03/31/2015   Procedure: Left Heart Cath and Coronary Angiography;  Surgeon: Marykay Lex, MD;  Location: [facility] INVASIVE CV LAB: LM 65%, mLAD 70%, 90% & 80%, o-pCx 75%, mCx 95% & d Cx 70%, OM1 60%, mRCA 90% with 100% dRCA. R-R collaterals --> CABG referral. Also noted to be in Afib.  . Carotid Dopplers  12/30/2016   Patent vertebrals. Mild bilateral carotid plaque, <50% stenosis bilaterally.  . CHOLECYSTECTOMY  2000   also had revision of hernia repair  . CORONARY ARTERY BYPASS GRAFT N/A 04/04/2015   Procedure: CORONARY ARTERY BYPASS GRAFTING (CABG);  Surgeon: [name], MD;  Location: [facility] OR;  Service: Open Heart Surgery;  Laterality: N/A;  . CYSTECTOMY     face and scrotum  . CYSTOSCOPY WITH INSERTION OF UROLIFT N/A 11/12/2016   Procedure: CYSTOSCOPY WITH INSERTION OF UROLIFT x4;  Surgeon: Jethro Bolus, MD;  Location: [facility] ORS;  Service: Urology;  Laterality: N/A;  . CYSTOSCOPY WITH RETROGRADE PYELOGRAM, URETEROSCOPY AND STENT PLACEMENT Right 06/26/2016   Procedure: CYSTOSCOPY WITH RIGHT RETROGRADE PYELOGRAM, RIGHT URETEROSCOPY , HOLMIUM LASER, BASKET EXTRACTION AND STENT PLACEMENT;  Surgeon: Lynelle Smoke  Patsi Sears, MD;  Location: [facility] ORS;  Service: Urology;  Laterality: Right;  . CYSTOSCOPY WITH RETROGRADE PYELOGRAM, URETEROSCOPY AND STENT PLACEMENT Left 09/23/2017   Procedure: CYSTOSCOPY WITH LEFT RETROGRADE PYELOGRAM, URETEROSCOPY STONE EXTRACTION AND STENT PLACEMENT;  Surgeon: Crista Elliot, MD;  Location: [facility] ORS;  Service: Urology;  Laterality: Left;  . Lexiscan Myoview  June 2010   converted to [facility] from treadmill due to inability to reach target heart rate) --the study was normal with no evidence of ischemia and normal LV function. EF 7  . LITHOTRIPSY    . TEE WITHOUT CARDIOVERSION N/A 04/04/2015    Procedure: TRANSESOPHAGEAL ECHOCARDIOGRAM (TEE);  Surgeon: [name], MD;  Location: [facility] OR;  Service: Open Heart Surgery;  Laterality: N/A;  . TRANSTHORACIC ECHOCARDIOGRAM  12/30/2016   EF 60-65%. GR 1 DD. Mildly dilated ascending aorta. Otherwise essentially normal with no valve lesions.  Marland Kitchen VASECTOMY  1975  . VENTRAL HERNIA REPAIR  1994   complicated by infection     SOCIAL HISTORY:   Social History   Tobacco Use  . Smoking status: Former Smoker    Packs/day: 2.00    Years: 40.00    Pack years: 80.00    Types: Cigarettes    Last attempt to quit: 10/17/1992    Years since quitting: 24.9  . Smokeless tobacco: Never Used  . Tobacco comment: quit 1994  Substance Use Topics  . Alcohol use: Yes    Alcohol/week: 0.6 oz    Types: 1 Standard drinks or equivalent per week    Comment: seldom - vodka or wine     FAMILY HISTORY:   Family History  Problem Relation Age of Onset  . Coronary artery disease Father   . Heart disease Father   . Stroke Mother   . Hearing loss Mother   . Stroke Sister   . Hearing loss Maternal Grandmother   . Prostate cancer Neg Hx   . Kidney disease Neg Hx   . Other Neg Hx        hypogonadism     DRUG ALLERGIES:  No Known Allergies  MEDICATIONS AT HOME:   Prior to Admission medications   Medication Sig Start Date End Date Taking? Authorizing Provider  acetaminophen (TYLENOL) 650 MG CR tablet Take 650 mg by mouth every 8 (eight) hours as needed for pain. Reported on 01/03/2016    [provider]  atorvastatin (LIPITOR) 40 MG tablet Take 1 tablet (40 mg total) by mouth daily. 04/18/17   Glori Luis, MD  clonazePAM (KLONOPIN) 0.5 MG tablet Take 0.5 tablets (0.25 mg total) by mouth 2 (two) times daily as needed for anxiety. 09/27/17   Glori Luis, MD  clopidogrel (PLAVIX) 75 MG tablet Take 1 tablet (75 mg total) by mouth daily. 08/03/16   Glori Luis, MD  diphenhydramine-acetaminophen (TYLENOL PM) 25-500 MG TABS  tablet Take 1 tablet by mouth at bedtime as needed (sleep).    [provider]  finasteride (PROSCAR) 5 MG tablet Take 1 tablet (5 mg total) by mouth daily. 09/16/17   Glori Luis, MD  gabapentin (NEURONTIN) 100 MG capsule TAKE ONE CAPSULE BY MOUTH TWICE A DAY 05/14/17   Judi Saa, DO  metoprolol tartrate (LOPRESSOR) 25 MG tablet TAKE 1/2 TABLET TWICE DAILY 09/13/17   Glori Luis, MD  NITROSTAT 0.4 MG SL tablet PLACE 1 TABLET (0.4 MG TOTAL) UNDER THE TONGUE EVERY 5 (FIVE) MINUTES AS NEEDED FOR CHEST PAIN. 01/01/17   [name],  Piedad Climesavid W, MD  sertraline (ZOLOFT) 50 MG tablet Take 1 tablet (50 mg total) by mouth daily. 09/27/17   Glori LuisSonnenberg, Eric G, MD  tamsulosin (FLOMAX) 0.4 MG CAPS capsule Take 0.4 mg by mouth daily.  05/02/16   [provider]  testosterone cypionate (DEPOTESTOSTERONE CYPIONATE) 200 MG/ML injection Inject 100 mg into the muscle every 14 (fourteen) days.  10/24/16   [provider]    REVIEW OF SYSTEMS:  Review of Systems  Constitutional: Negative for chills, fever, malaise/fatigue and weight loss.  HENT: Negative for ear pain, hearing loss and tinnitus.   Eyes: Negative for blurred vision, double vision, pain and redness.  Respiratory: Negative for cough, hemoptysis and shortness of breath.   Cardiovascular: Negative for chest pain, palpitations, orthopnea and leg swelling.  Gastrointestinal: Positive for nausea and vomiting. Negative for abdominal pain, constipation and diarrhea.  Genitourinary: Negative for dysuria, frequency and hematuria.  Musculoskeletal: Negative for back pain, joint pain and neck pain.  Skin:       No acne, rash, or lesions  Neurological: Positive for dizziness. Negative for tremors, focal weakness and weakness.  Endo/Heme/Allergies: Negative for polydipsia. Does not bruise/bleed easily.  Psychiatric/Behavioral: Negative for depression. The patient is not nervous/anxious and does not have insomnia.      VITAL  SIGNS:   Vitals:   10/02/17 2130 10/02/17 2200 10/02/17 2300 10/02/17 2330  BP: (!) 164/82 (!) 167/86 (!) 164/78 138/67  Pulse: 64 66 64 74  Resp: 17 16 17 19   Temp:      TempSrc:      SpO2: 97% 96% 98% 97%  Weight:      Height:       Wt Readings from Last 3 Encounters:  10/02/17 95.3 kg (210 lb)  09/27/17 94.7 kg (208 lb 12.8 oz)  09/23/17 95.3 kg (210 lb)    PHYSICAL EXAMINATION:  Physical Exam  Vitals reviewed. Constitutional: He is oriented to person, place, and time. He appears well-developed and well-nourished. No distress.  HENT:  Head: Normocephalic and atraumatic.  Mouth/Throat: Oropharynx is clear and moist.  Eyes: Conjunctivae and EOM are normal. Pupils are equal, round, and reactive to light. No scleral icterus.  Neck: Normal range of motion. Neck supple. No JVD present. No thyromegaly present.  Cardiovascular: Normal rate, regular rhythm and intact distal pulses. Exam reveals no gallop and no friction rub.  No murmur heard. Respiratory: Effort normal and breath sounds normal. No respiratory distress. He has no wheezes. He has no rales.  GI: Soft. Bowel sounds are normal. He exhibits no distension. There is no tenderness.  Musculoskeletal: Normal range of motion. He exhibits no edema.  No arthritis, no gout  Lymphadenopathy:    He has no cervical adenopathy.  Neurological: He is alert and oriented to person, place, and time. No cranial nerve deficit.  No dysarthria, no aphasia  Skin: Skin is warm and dry. No rash noted. No erythema.  Psychiatric: He has a normal mood and affect. His behavior is normal. Judgment and thought content normal.    LABORATORY PANEL:   CBC Recent Labs  Lab 10/02/17 1303  WBC 10.1  HGB 16.6  HCT 51.0  PLT 174   ------------------------------------------------------------------------------------------------------------------  Chemistries  Recent Labs  Lab 10/02/17 1303  NA 135  K 4.2  CL 105  CO2 20*  GLUCOSE 117*   BUN 24*  CREATININE 1.31*  CALCIUM 8.2*   ------------------------------------------------------------------------------------------------------------------  Cardiac Enzymes Recent Labs  Lab 10/02/17 1303  TROPONINI <0.03   ------------------------------------------------------------------------------------------------------------------  RADIOLOGY:  Ct Head Wo Contrast  Result Date: 10/02/2017 CLINICAL DATA:  Dizziness and anxiety for the past few weeks, headaches EXAM: CT HEAD WITHOUT CONTRAST TECHNIQUE: Contiguous axial images were obtained from the base of the skull through the vertex without intravenous contrast. Sagittal and coronal MPR images reconstructed from axial data set. COMPARISON:  12/30/2016 FINDINGS: Brain: Cavum septum pellucidum. Otherwise normal ventricular morphology. No midline shift or mass effect. Minimal small vessel chronic ischemic changes of deep cerebral white matter. No intracranial hemorrhage, mass lesion, or evidence of acute infarction. No extra-axial fluid collections. Vascular: Atherosclerotic calcification of internal carotid and vertebral arteries at skull base Skull: Intact Sinuses/Orbits: Clear Other: N/A IMPRESSION: Atrophy with minimal small vessel chronic ischemic changes of deep cerebral white matter. No acute intracranial abnormalities. Electronically Signed   By: Ulyses Southward M.D.   On: 10/02/2017 16:45    EKG:   Orders placed or performed during the hospital encounter of 10/02/17  . EKG 12-Lead  . EKG 12-Lead  . ED EKG  . ED EKG    IMPRESSION AND PLAN:  Principal Problem:   Orthostatic hypotension -clear etiology, potentially some mild dehydration and fluid shift equilibrium disorder due to his recent nausea and vomiting.  We will hydrate him gently with IV fluids tonight.  We will recheck orthostatics in the morning, if they have not improved we could consider further evaluation of potential central nervous system cause of his  symptoms. Active Problems:   Essential hypertension -continue home meds   CAD, multiple vessel -continue home medications   PAF (paroxysmal atrial fibrillation) (HCC) -continue home dose rate controlling meds   COPD (chronic obstructive pulmonary disease) (HCC) -home dose inhalers   Anxiety -home dose anxiolytics  Chart review performed and case discussed with ED provider. Labs, imaging and/or ECG reviewed by provider and discussed with patient/family. Management plans discussed with the patient and/or family.  DVT PROPHYLAXIS: SubQ lovenox  GI PROPHYLAXIS: None  ADMISSION STATUS: Observation  CODE STATUS: Full Code Status History    Date Active Date Inactive Code Status Order ID Comments User Context   12/30/2016 03:27 12/30/2016 10:42 Full Code [id number]  Arnaldo Natal, MD ED   04/04/2015 14:48 04/11/2015 18:18 Full Code [id number]  Zada Girt Inpatient   03/31/2015 09:27 04/04/2015 14:48 Full Code [id number]  Marykay Lex, MD Inpatient      TOTAL TIME TAKING CARE OF THIS PATIENT: 40 minutes.   Karalyne Nusser FIELDING 10/03/2017, 12:25 AM  [facility]  Office  [phone]  CC: Primary care physician; Glori Luis, MD  Note:  This document was prepared using Dragon voice recognition software and may include unintentional dictation errors.

## 2017-10-03 NOTE — Progress Notes (Signed)
Per Dr. Luberta MutterKonidena hold zoloft per MD as this is a new med.

## 2017-10-03 NOTE — Progress Notes (Signed)
Notified Dr. Luberta MutterKonidena to make aware of campylobacter in stool.

## 2017-10-03 NOTE — Progress Notes (Signed)
admittedthis morning for near syncope and found to have severe orthostatic hypotension.  And has been feeling dizzy for long time according to patient's wife.  Recently started on Zoloft by PCP for possible anxiety and depression.  Patient's testosterone also.  Recently.  Improved dehydration.  But patient has diarrhea.  Labs, medicines reviewed. Physical exam: Alert, awake, oriented, somewhat anxious. Cardiovascular: S1, S2 regular. Lungs: Clear to auscultation, no wheezing, no rales. Abdomen: Soft, nontender, nondistended, bowel sounds present. Assessment and plan: Near syncope secondary to orthostatic hypotension: Patient not on diuretics.  Received IV fluids, patient still has orthostatic hypotension.  Although he feels ready to go home. 2.  Diarrhea: Patient not on stool softeners.  Check stool for C. difficile colitis, GI panel. 3.  History of CAD, CABG, history of hyperlipidemia: Patient follows [facility]. 4.  Chronic dizziness: Patient has been having dizziness dated back to December 2018 as per Dr. Elissa HeftyHarding's note.  Decrease the dose of Neurontin, Flomax and Proscar for possible culprits. 5.  Atrial fibrillation chronic: Patient on low-dose beta-blockers.  Continue Plavix. Plan to get carotid ultrasound, GI panel, C. difficile testing.  Likely discharge tomorrow morning. 6.  Fatigue, no energy,  was getting testosterone injection but recently recommended to stop by urologist. #7 anxiety, depression: Patient on Klonopin, Zoloft, Zoloft started recently.  Discontinued Zoloft while in the hospital. Time spent is 30 minutes.  Discussed with wife in detail.

## 2017-10-03 NOTE — Progress Notes (Signed)
Per Dr. Luberta MutterKonidena hold metoprolol for low BP and and change flomax to night time.

## 2017-10-03 NOTE — Care Management Obs Status (Signed)
MEDICARE OBSERVATION STATUS NOTIFICATION   Patient Details  Name: [name] MRN: [id number] Date of Birth: Apr 09, 1946   Medicare Observation Status Notification Given:  Yes    Chapman FitchBOWEN, Christyan Reger T, RN 10/03/2017, 3:55 PM

## 2017-10-03 NOTE — Progress Notes (Addendum)
Tamsulosin administration time changed to 17:00 - try giving with a meal to slow down absorption time (Tmax) (time to peak absorption 4 to 5 hours in a fasted state but 6 to 7 hours with food). Bioavailability is increased up to 30% in a fasted state so giving with food may also decrease the extent of absorption (Cmax).

## 2017-10-04 DIAGNOSIS — I951 Orthostatic hypotension: Secondary | ICD-10-CM | POA: Diagnosis not present

## 2017-10-04 DIAGNOSIS — I10 Essential (primary) hypertension: Secondary | ICD-10-CM | POA: Diagnosis not present

## 2017-10-04 DIAGNOSIS — I480 Paroxysmal atrial fibrillation: Secondary | ICD-10-CM | POA: Diagnosis not present

## 2017-10-04 DIAGNOSIS — I2510 Atherosclerotic heart disease of native coronary artery without angina pectoris: Secondary | ICD-10-CM | POA: Diagnosis not present

## 2017-10-04 MED ORDER — AZITHROMYCIN 250 MG PO TABS: ORAL_TABLET | ORAL | 0 refills | Status: DC

## 2017-10-04 NOTE — Discharge Summary (Signed)
[name], is a 72 y.o. male  DOB Jan 20, 1946  MRN [id number].  Admission date:  10/02/2017  Admitting Physician  Oralia Manis, MD  Discharge Date:  10/04/2017   Primary MD  Glori Luis, MD  Recommendations for primary care physician for things to follow:   Follow-up with PCP in 1 week Follow-up with Dr. Bryan Lemma within 1 week    Admission Diagnosis  Orthostatic hypotension [I95.1]   Discharge Diagnosis  Orthostatic hypotension [I95.1]    Principal Problem:   Orthostatic hypotension Active Problems:   Essential hypertension   CAD, multiple vessel   PAF (paroxysmal atrial fibrillation) (HCC)   COPD (chronic obstructive pulmonary disease) (HCC)   Anxiety      Past Medical History:  Diagnosis Date  . Arthritis   . CAD, multiple vessel 03/2105   LM 65%, mLAD 70%, 90% & 80%, o-pCx 75%, mCx 95% & d Cx 70%, OM1 60%, mRCA 90% with 100% dRCA. R-R collaterals  . Chronic distal aortic occlusion (HCC)    By report  . Chronic kidney disease   . COPD (chronic obstructive pulmonary disease) (HCC)   . Depression   . Enlarged prostate   . Erectile dysfunction   . Essential hypertension   . History of kidney stones    multiple lithotripsies  . Hyperlipidemia LDL goal <70   . Hypogonadism in male    Previously on testosterone injections, but has not yet restarted until further testing  . Muscle pain   . PAF (paroxysmal atrial fibrillation) (HCC) 03/2015   Noted per-cath & post-op CABG.  Dr. [name], cardiology  . Peripheral vascular disease (HCC)    Bilateral ABIs 0.8  . Renal stones 03/2016  . S/P CABG x 5 04/04/2015   LIMA to LAD, SVG to DIAG, SVG to OM, seqSVG -PDA- dCx  . SOB (shortness of breath)   . 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 Weakness     weakness bilateral legs, s/p vein harvesting for CABPG    Past Surgical History:  Procedure Laterality Date  . BLEPHAROPLASTY    . CARDIAC CATHETERIZATION  1992  . CARDIAC CATHETERIZATION N/A 03/31/2015   Procedure: Left Heart Cath and Coronary Angiography;  Surgeon: Marykay Lex, MD;  Location: [facility] INVASIVE CV LAB: LM 65%, mLAD 70%, 90% & 80%, o-pCx 75%, mCx 95% & d Cx 70%, OM1 60%, mRCA 90% with 100% dRCA. R-R collaterals --> CABG referral. Also noted to be in Afib.  . Carotid Dopplers  12/30/2016   Patent vertebrals. Mild bilateral carotid plaque, <50% stenosis bilaterally.  . CHOLECYSTECTOMY  2000   also had revision of hernia repair  . CORONARY ARTERY BYPASS GRAFT N/A 04/04/2015   Procedure: CORONARY ARTERY BYPASS GRAFTING (CABG);  Surgeon: [name], MD;  Location: [facility] OR;  Service: Open Heart Surgery;  Laterality: N/A;  . CYSTECTOMY     face and scrotum  . CYSTOSCOPY WITH INSERTION OF UROLIFT N/A 11/12/2016   Procedure: CYSTOSCOPY WITH INSERTION OF UROLIFT x4;  Surgeon: Jethro Bolus, MD;  Location: [facility] ORS;  Service: Urology;  Laterality: N/A;  . CYSTOSCOPY WITH RETROGRADE PYELOGRAM, URETEROSCOPY AND STENT PLACEMENT Right 06/26/2016   Procedure: CYSTOSCOPY WITH RIGHT RETROGRADE PYELOGRAM, RIGHT URETEROSCOPY , HOLMIUM LASER, BASKET EXTRACTION AND STENT PLACEMENT;  Surgeon: Jethro Bolus, MD;  Location: [facility] ORS;  Service: Urology;  Laterality: Right;  . CYSTOSCOPY WITH RETROGRADE PYELOGRAM, URETEROSCOPY AND STENT PLACEMENT Left 09/23/2017   Procedure:  CYSTOSCOPY WITH LEFT RETROGRADE PYELOGRAM, URETEROSCOPY STONE EXTRACTION AND STENT PLACEMENT;  Surgeon: Crista Elliot, MD;  Location: [facility];  Service: Urology;  Laterality: Left;  . Lexiscan Myoview  June 2010   converted to [facility] from treadmill due to inability to reach target heart rate) --the study was normal with no evidence of ischemia and normal LV function. EF 7  . LITHOTRIPSY    . TEE WITHOUT CARDIOVERSION  N/A 04/04/2015   Procedure: TRANSESOPHAGEAL ECHOCARDIOGRAM (TEE);  Surgeon: [name], MD;  Location: [facility] OR;  Service: Open Heart Surgery;  Laterality: N/A;  . TRANSTHORACIC ECHOCARDIOGRAM  12/30/2016   EF 60-65%. GR 1 DD. Mildly dilated ascending aorta. Otherwise essentially normal with no valve lesions.  Marland Kitchen VASECTOMY  1975  . VENTRAL HERNIA REPAIR  1994   complicated by infection       History of present illness and  Hospital Course:     Kindly see H&P for history of present illness and admission details, please review complete Labs, Consult reports and Test reports for all details in brief  HPI  from the history and physical done on the day of admission 72 year old male patient with multiple medical problems of chronic atrial fibrillation, CAD, CABG, hyperlipidemia admitted for dizziness, found to have severe orthostatic hypotension.   Hospital Course  #1 dizziness, severe orthostatic hypotension likely due to combination of medication namely Neurontin, Zoloft.  Patient started on Zoloft by PCP recently for possible anxiety and depression.  But according to family patient has been having dizziness for long time but gotten worse recently and  unable to function because of dizziness.  Patient has severe orthostatic hypotension with blood pressure changes, BP lying down was 166/82, standing blood pressure was 126/76 which is very concerning and patient also felt dizzy when he walks.  We stopped her Zoloft, Neurontin, changed the Flomax also to nighttime.  Patient takes testosterone injections for long time but recently he said that he is not having any benefit so he wants to stop testosterone injections also.  And wife he is aware that we stopped the Neurontin, Zoloft and she is okay with that.  Patient feels much better and not having any more dizziness.  I told the wife to check the blood pressure at home while lying down and also standing every day for a couple of days and see how the  BP is.  And follow-up with PCP. 2.  Campylobacter diarrhea:  had diarrhea yesterday, stool GI panel showed no C. difficile but has Campylobacter.  Started on azithromycin patient will need azithromycin 500 mg daily for 3 days, he did take 1 dose yesterday, he needs 2 more days of azithromycin.  I told the patient and wife about that. 3.  Chronic atrial fibrillation, patient follows with Dr. Bryan Lemma.  Patient has dizziness because of that he had ultrasound of carotids which did not show much change from last year ,  lessthan 50% stenosis. 4.  History of CAD, CABG, hyperlipidemia: Patient takes , Plavix, statins, small dose beta-blockers.  Patient can follow-up with Dr. Bryan Lemma. 5.  History of BPH, multiple kidney stones, patient had a recent stent removal by urology, patient takes Flomax, finasteride.    Discharge Condition: Stable  Follow UP  Follow-up Information    Glori Luis, MD. Schedule an appointment as soon as possible for a visit in 2 days.   Specialty:  Family Medicine Contact information: [address] [place] [address] [phone]  Gracelyn Nurse, MD. Schedule an appointment as soon as possible for a visit in 2 days.   Specialty:  Internal Medicine Contact information: [address] [place] [address] [phone]        Your Urologist. Call in 1 day.        Marykay Lex, MD. Schedule an appointment as soon as possible for a visit in 1 week(s).   Specialty:  Cardiology Contact information: [address] [place] [address] [phone]             Discharge Instructions  and  Discharge Medications      Allergies as of 10/04/2017   No Known Allergies     Medication List    STOP taking these medications   acetaminophen 650 MG CR tablet Commonly known as:  TYLENOL   diphenhydramine-acetaminophen 25-500 MG Tabs tablet Commonly known as:  TYLENOL PM   gabapentin 100 MG  capsule Commonly known as:  NEURONTIN   sertraline 50 MG tablet Commonly known as:  ZOLOFT   testosterone cypionate 200 MG/ML injection Commonly known as:  DEPOTESTOSTERONE CYPIONATE     TAKE these medications   atorvastatin 40 MG tablet Commonly known as:  LIPITOR Take 1 tablet (40 mg total) by mouth daily.   azithromycin 250 MG tablet Commonly known as:  ZITHROMAX Daily for 2 days   clonazePAM 0.5 MG tablet Commonly known as:  KLONOPIN Take 0.5 tablets (0.25 mg total) by mouth 2 (two) times daily as needed for anxiety.   clopidogrel 75 MG tablet Commonly known as:  PLAVIX Take 1 tablet (75 mg total) by mouth daily.   finasteride 5 MG tablet Commonly known as:  PROSCAR Take 1 tablet (5 mg total) by mouth daily.   metoprolol tartrate 25 MG tablet Commonly known as:  LOPRESSOR TAKE 1/2 TABLET TWICE DAILY   NITROSTAT 0.4 MG SL tablet Generic drug:  nitroGLYCERIN PLACE 1 TABLET (0.4 MG TOTAL) UNDER THE TONGUE EVERY 5 (FIVE) MINUTES AS NEEDED FOR CHEST PAIN.   tamsulosin 0.4 MG Caps capsule Commonly known as:  FLOMAX Take 0.4 mg by mouth daily.         Diet and Activity recommendation: See Discharge Instructions above   Consults obtained -none   Major procedures and Radiology Reports - PLEASE review detailed and final reports for all details, in brief -      Dg Chest 2 View  Result Date: 09/17/2017 CLINICAL DATA:  Chest pain for 2 days EXAM: CHEST  2 VIEW COMPARISON:  12/30/2016 FINDINGS: Cardiac shadow is stable. Postoperative changes are again seen. Lungs are hyperinflated consistent with COPD. No focal infiltrate or sizable effusion is noted. Degenerative changes of the thoracic spine are noted. IMPRESSION: COPD without acute abnormality. Electronically Signed   By: Alcide Clever M.D.   On: 09/17/2017 11:02   Ct Head Wo Contrast  Result Date: 10/02/2017 CLINICAL DATA:  Dizziness and anxiety for the past few weeks, headaches EXAM: CT HEAD WITHOUT CONTRAST  TECHNIQUE: Contiguous axial images were obtained from the base of the skull through the vertex without intravenous contrast. Sagittal and coronal MPR images reconstructed from axial data set. COMPARISON:  12/30/2016 FINDINGS: Brain: Cavum septum pellucidum. Otherwise normal ventricular morphology. No midline shift or mass effect. Minimal small vessel chronic ischemic changes of deep cerebral white matter. No intracranial hemorrhage, mass lesion, or evidence of acute infarction. No extra-axial fluid collections. Vascular: Atherosclerotic calcification of internal carotid and vertebral arteries at skull base Skull: Intact  Sinuses/Orbits: Clear Other: N/A IMPRESSION: Atrophy with minimal small vessel chronic ischemic changes of deep cerebral white matter. No acute intracranial abnormalities. Electronically Signed   By: Ulyses SouthwardMark  Boles M.D.   On: 10/02/2017 16:45   Ct Angio Chest Pe W And/or Wo Contrast  Result Date: 09/17/2017 CLINICAL DATA:  72 year old male with a history chest pain EXAM: CT ANGIOGRAPHY CHEST CT ABDOMEN AND PELVIS WITH CONTRAST TECHNIQUE: Multidetector CT imaging of the chest was performed using the standard protocol during bolus administration of intravenous contrast. Multiplanar CT image reconstructions and MIPs were obtained to evaluate the vascular anatomy. Multidetector CT imaging of the abdomen and pelvis was performed using the standard protocol during bolus administration of intravenous contrast. CONTRAST:  100mL ISOVUE-370 IOPAMIDOL (ISOVUE-370) INJECTION 76% COMPARISON:  None. FINDINGS: CTA CHEST FINDINGS Cardiovascular: Heart: Borderline enlarged heart. No pericardial fluid/thickening. Surgical changes of median sternotomy and CABG. Minimal calcifications of aortic valve. Native coronary calcifications. Aorta: Mild atherosclerotic changes of the thoracic aorta. No dissection. No aneurysm. No periaortic fluid. Common origin of the innominate artery an the left common carotid artery. Branch  vessels are patent with atherosclerotic changes at the origin. Atherosclerotic changes of the descending thoracic aorta with no dissection flap. Pulmonary arteries: No central, lobar, segmental, or proximal subsegmental filling defects. Mediastinum/Nodes: No mediastinal adenopathy. Small retained enteric contrast within the thoracic esophagus. Hiatal hernia. Unremarkable appearance of the thoracic inlet and thyroid. Lungs/Pleura: Respiratory motion somewhat limits evaluation the lungs. Centrilobular and paraseptal emphysema most pronounced at the apices. No confluent airspace disease. Atelectasis/scarring at the dependent lung bases. No interlobular septal thickening. No peribronchial fluid. No endobronchial or endotracheal debris. No pleural effusion. No pneumothorax. Musculoskeletal: Accentuated kyphotic curvature of the thoracic spine. No bony canal narrowing. No significant foraminal narrowing of the thoracic spine. Flowing anterior osteophyte production. Review of the MIP images confirms the above findings. CT ABDOMEN and PELVIS FINDINGS Hepatobiliary: Focal fatty infiltration adjacent to the falciform ligament. Cholecystectomy. No extrahepatic or intrahepatic biliary ductal dilatation. Pancreas: Unremarkable pancreas Spleen: Unremarkable spleen Adrenals/Urinary Tract: Unremarkable appearance of the bilateral adrenal glands. No right-sided hydronephrosis. Small punctate calcifications may reflect non obstructive stones versus vascular calcifications. Unremarkable course of the right ureter. Mild left-sided hydronephrosis with proximal left ureteral stone measuring approximately 5-6 mm. Perinephric stranding on the left. There are multiple additional nonobstructive stones in the left collecting system, predominantly in the lower pole, all of which which measure perhaps 2 mm-3 mm. Ureter is decompressed to the distal aspect. There is a small additional 2 mm stone at the ureterovesical junction. Unremarkable  appearance of the urinary bladder. Stomach/Bowel: Stomach distended with enteric contrast. Hiatal hernia. No abnormally distended small bowel. No transition point. Partial malrotation, with rightward sweep of the duodenum. Short segment of small bowel is entrapped within ventral wall hernia without transition point or significant inflammatory changes. Normal appendix. No significant stool burden. Colonic diverticula, concentrated within the sigmoid colon. No evidence of acute inflammatory changes. Vascular/Lymphatic: Advanced atherosclerotic changes of the abdominal aorta. No dissection. Significant aortoiliac disease with occlusion of the distal aorta/common iliac arteries. Reconstitution of the bilateral common iliac arteries with maintained flow through the external iliac arteries and the bilateral hypogastric arteries. Proximal SFA and profunda femoris patent bilaterally. Mesenteric atherosclerosis involving the origin of celiac artery, superior mesenteric artery, and inferior mesenteric artery which is significantly enlarged. Bilateral renal artery atherosclerosis, likely at least 50% stenosis bilaterally. Reproductive: Left-sided varicocele. Calcifications of the prostate with transverse diameter measuring approximately 3.9 cm. Other: Fat containing ventral  wall hernia, which also contains short segment small bowel. No evidence of obstruction. Bilateral fact containing inguinal hernia. Musculoskeletal: Multilevel degenerative changes of the thoracolumbar spine. Facet changes are most pronounced in the lower lumbar spine. No acute fracture. Mild degenerative changes of the hips. Review of the MIP images confirms the above findings. IMPRESSION: CTA chest negative for pulmonary emboli. No acute finding of the chest. Obstructing left ureteral stone with mild hydronephrosis. There is an additional small stone in the distal left ureter at the ureterovesical junction. If there is concern for ascending urinary tract  infection, recommend correlation with urinalysis. Bilateral nonobstructing stones of the collecting system, left greater than right. Advanced aortic atherosclerosis with aortoiliac disease including bilateral common iliac artery occlusions. Reconstitution of the bilateral iliac system via collateral flow. Aortic Atherosclerosis (ICD10-I70.0). Mesenteric atherosclerosis, with at least high-grade stenosis of the SMA and likely the celiac artery. If there is concern for symptoms of chronic mesenteric ischemia, recommend correlation with mesenteric duplex exam. Paraseptal and centrilobular emphysema.  Emphysema (ICD10-J43.9). Surgical changes of median sternotomy and CABG. Diverticular disease without evidence of acute diverticulitis. Ventral wall hernia containing short segment of small bowel without obstruction. Hiatal hernia Electronically Signed   By: Gilmer Mor D.O.   On: 09/17/2017 13:33   Ct Abdomen Pelvis W Contrast  Result Date: 09/17/2017 CLINICAL DATA:  72 year old male with a history chest pain EXAM: CT ANGIOGRAPHY CHEST CT ABDOMEN AND PELVIS WITH CONTRAST TECHNIQUE: Multidetector CT imaging of the chest was performed using the standard protocol during bolus administration of intravenous contrast. Multiplanar CT image reconstructions and MIPs were obtained to evaluate the vascular anatomy. Multidetector CT imaging of the abdomen and pelvis was performed using the standard protocol during bolus administration of intravenous contrast. CONTRAST:  ISOVUE-370 IOPAMIDOL (ISOVUE-370) INJECTION 76% COMPARISON:  None. FINDINGS: CTA CHEST FINDINGS Cardiovascular: Heart: Borderline enlarged heart. No pericardial fluid/thickening. Surgical changes of median sternotomy and CABG. Minimal calcifications of aortic valve. Native coronary calcifications. Aorta: Mild atherosclerotic changes of the thoracic aorta. No dissection. No aneurysm. No periaortic fluid. Common origin of the innominate artery an the left  common carotid artery. Branch vessels are patent with atherosclerotic changes at the origin. Atherosclerotic changes of the descending thoracic aorta with no dissection flap. Pulmonary arteries: No central, lobar, segmental, or proximal subsegmental filling defects. Mediastinum/Nodes: No mediastinal adenopathy. Small retained enteric contrast within the thoracic esophagus. Hiatal hernia. Unremarkable appearance of the thoracic inlet and thyroid. Lungs/Pleura: Respiratory motion somewhat limits evaluation the lungs. Centrilobular and paraseptal emphysema most pronounced at the apices. No confluent airspace disease. Atelectasis/scarring at the dependent lung bases. No interlobular septal thickening. No peribronchial fluid. No endobronchial or endotracheal debris. No pleural effusion. No pneumothorax. Musculoskeletal: Accentuated kyphotic curvature of the thoracic spine. No bony canal narrowing. No significant foraminal narrowing of the thoracic spine. Flowing anterior osteophyte production. Review of the MIP images confirms the above findings. CT ABDOMEN and PELVIS FINDINGS Hepatobiliary: Focal fatty infiltration adjacent to the falciform ligament. Cholecystectomy. No extrahepatic or intrahepatic biliary ductal dilatation. Pancreas: Unremarkable pancreas Spleen: Unremarkable spleen Adrenals/Urinary Tract: Unremarkable appearance of the bilateral adrenal glands. No right-sided hydronephrosis. Small punctate calcifications may reflect non obstructive stones versus vascular calcifications. Unremarkable course of the right ureter. Mild left-sided hydronephrosis with proximal left ureteral stone measuring approximately 5-6 mm. Perinephric stranding on the left. There are multiple additional nonobstructive stones in the left collecting system, predominantly in the lower pole, all of which which measure perhaps 2 mm-3 mm. Ureter is decompressed to  the distal aspect. There is a small additional 2 mm stone at the  ureterovesical junction. Unremarkable appearance of the urinary bladder. Stomach/Bowel: Stomach distended with enteric contrast. Hiatal hernia. No abnormally distended small bowel. No transition point. Partial malrotation, with rightward sweep of the duodenum. Short segment of small bowel is entrapped within ventral wall hernia without transition point or significant inflammatory changes. Normal appendix. No significant stool burden. Colonic diverticula, concentrated within the sigmoid colon. No evidence of acute inflammatory changes. Vascular/Lymphatic: Advanced atherosclerotic changes of the abdominal aorta. No dissection. Significant aortoiliac disease with occlusion of the distal aorta/common iliac arteries. Reconstitution of the bilateral common iliac arteries with maintained flow through the external iliac arteries and the bilateral hypogastric arteries. Proximal SFA and profunda femoris patent bilaterally. Mesenteric atherosclerosis involving the origin of celiac artery, superior mesenteric artery, and inferior mesenteric artery which is significantly enlarged. Bilateral renal artery atherosclerosis, likely at least 50% stenosis bilaterally. Reproductive: Left-sided varicocele. Calcifications of the prostate with transverse diameter measuring approximately 3.9 cm. Other: Fat containing ventral wall hernia, which also contains short segment small bowel. No evidence of obstruction. Bilateral fact containing inguinal hernia. Musculoskeletal: Multilevel degenerative changes of the thoracolumbar spine. Facet changes are most pronounced in the lower lumbar spine. No acute fracture. Mild degenerative changes of the hips. Review of the MIP images confirms the above findings. IMPRESSION: CTA chest negative for pulmonary emboli. No acute finding of the chest. Obstructing left ureteral stone with mild hydronephrosis. There is an additional small stone in the distal left ureter at the ureterovesical junction. If there is  concern for ascending urinary tract infection, recommend correlation with urinalysis. Bilateral nonobstructing stones of the collecting system, left greater than right. Advanced aortic atherosclerosis with aortoiliac disease including bilateral common iliac artery occlusions. Reconstitution of the bilateral iliac system via collateral flow. Aortic Atherosclerosis (ICD10-I70.0). Mesenteric atherosclerosis, with at least high-grade stenosis of the SMA and likely the celiac artery. If there is concern for symptoms of chronic mesenteric ischemia, recommend correlation with mesenteric duplex exam. Paraseptal and centrilobular emphysema.  Emphysema (ICD10-J43.9). Surgical changes of median sternotomy and CABG. Diverticular disease without evidence of acute diverticulitis. Ventral wall hernia containing short segment of small bowel without obstruction. Hiatal hernia Electronically Signed   By: Gilmer Mor D.O.   On: 09/17/2017 13:33   US Carotid Bilateral  Result Date: 10/03/2017 CLINICAL DATA:  Dizziness, visual disturbance EXAM: BILATERAL CAROTID DUPLEX ULTRASOUND TECHNIQUE: Wallace Cullens scale imaging, color Doppler and duplex ultrasound were performed of bilateral carotid and vertebral arteries in the neck. COMPARISON:  12/30/2016 FINDINGS: Criteria: Quantification of carotid stenosis is based on velocity parameters that correlate the residual internal carotid diameter with NASCET-based stenosis levels, using the diameter of the distal internal carotid lumen as the denominator for stenosis measurement. The following velocity measurements were obtained: RIGHT ICA:  79/12 cm/sec CCA:  120/14 cm/sec SYSTOLIC ICA/CCA RATIO:  0.7 DIASTOLIC ICA/CCA RATIO:  0.8 ECA:  153 cm/sec LEFT ICA:  55/13 cm/sec CCA:  121/16 cm/sec SYSTOLIC ICA/CCA RATIO:  0.5 DIASTOLIC ICA/CCA RATIO:  0.8 ECA:  113 cm/sec RIGHT CAROTID ARTERY: Minor echogenic shadowing plaque formation. No hemodynamically significant right ICA stenosis, velocity  elevation, or turbulent flow. Degree of narrowing less than 50%. RIGHT VERTEBRAL ARTERY:  Antegrade LEFT CAROTID ARTERY: Similar scattered minor echogenic plaque formation. No hemodynamically significant left ICA stenosis, velocity elevation, or turbulent flow. LEFT VERTEBRAL ARTERY:  Antegrade IMPRESSION: Minor carotid atherosclerosis. No hemodynamically significant ICA stenosis. Degree of narrowing less than 50% bilaterally by  ultrasound criteria. Patent antegrade vertebral flow bilaterally Electronically Signed   By: Judie Petit.  Shick M.D.   On: 10/03/2017 15:59   Dg C-arm 1-60 Min-no Report  Result Date: 09/23/2017 Fluoroscopy was utilized by the requesting physician.  No radiographic interpretation.    Micro Results    Recent Results (from the past 240 hour(s))  C difficile quick scan w PCR reflex     Status: None   Collection Time: 10/03/17  9:27 AM  Result Value Ref Range Status   C Diff antigen NEGATIVE NEGATIVE Final   C Diff toxin NEGATIVE NEGATIVE Final   C Diff interpretation No C. difficile detected.  Final    Comment: Performed at [facility], [address] [place], [place] [address]  Gastrointestinal Panel by PCR , Stool     Status: Abnormal   Collection Time: 10/03/17  9:28 AM  Result Value Ref Range Status   Campylobacter species DETECTED (A) NOT DETECTED Final    Comment: RESULT CALLED TO, READ BACK BY AND VERIFIED WITH: ABBY GOODMAN @1238  10/03/17 MGP    Plesimonas shigelloides NOT DETECTED NOT DETECTED Final   Salmonella species NOT DETECTED NOT DETECTED Final   Yersinia enterocolitica NOT DETECTED NOT DETECTED Final   Vibrio species NOT DETECTED NOT DETECTED Final   Vibrio cholerae NOT DETECTED NOT DETECTED Final   Enteroaggregative E coli (EAEC) NOT DETECTED NOT DETECTED Final   Enteropathogenic E coli (EPEC) NOT DETECTED NOT DETECTED Final   Enterotoxigenic E coli (ETEC) NOT DETECTED NOT DETECTED Final   Shiga like toxin producing E coli (STEC) NOT DETECTED  NOT DETECTED Final   Shigella/Enteroinvasive E coli (EIEC) NOT DETECTED NOT DETECTED Final   Cryptosporidium NOT DETECTED NOT DETECTED Final   Cyclospora cayetanensis NOT DETECTED NOT DETECTED Final   Entamoeba histolytica NOT DETECTED NOT DETECTED Final   Giardia lamblia NOT DETECTED NOT DETECTED Final   Adenovirus F40/41 NOT DETECTED NOT DETECTED Final   Astrovirus NOT DETECTED NOT DETECTED Final   Norovirus GI/GII NOT DETECTED NOT DETECTED Final   Rotavirus A NOT DETECTED NOT DETECTED Final   Sapovirus (I, II, IV, and V) NOT DETECTED NOT DETECTED Final    Comment: Performed at [facility], [address] [place], [place] [address]       Today   Subjective:   Steadman Prosperi today has no headache,no chest abdominal pain,no new weakness tingling or numbness, feels much better wants to go home today.   Objective:   Blood pressure 134/75, pulse 72, temperature 98.2 F (36.8 C), temperature source Oral, resp. rate 20, height 5\' 10"  (1.778 m), weight 95.3 kg (210 lb), SpO2 98 %.   Intake/Output Summary (Last 24 hours) at 10/04/2017 0921 Last data filed at 10/04/2017 0208 Gross per 24 hour  Intake 652 ml  Output 550 ml  Net 102 ml    Exam Awake Alert, Oriented x 3, No new F.N deficits, Normal affect [place].AT,PERRAL Supple Neck,No JVD, No cervical lymphadenopathy appriciated.  Symmetrical Chest wall movement, Good air movement bilaterally, CTAB RRR,No Gallops,Rubs or new Murmurs, No Parasternal Heave +ve B.Sounds, Abd Soft, Non tender, No organomegaly appriciated, No rebound -guarding or rigidity. No Cyanosis, Clubbing or edema, No new Rash or bruise  Data Review   CBC w Diff:  Lab Results  Component Value Date   WBC 8.1 10/03/2017   HGB 14.9 10/03/2017   HGB 13.9 03/02/2015   HCT 45.4 10/03/2017   HCT 41.6 03/02/2015   PLT 184 10/03/2017   PLT  158 03/02/2015   LYMPHOPCT 38 11/18/2015   MONOPCT 16 11/18/2015   EOSPCT 3 11/18/2015   BASOPCT 1 11/18/2015     CMP:  Lab Results  Component Value Date   NA 138 10/03/2017   K 4.0 10/03/2017   CL 111 10/03/2017   CO2 19 (L) 10/03/2017   BUN 19 10/03/2017   CREATININE 1.13 10/03/2017   CREATININE 1.21 (H) 10/30/2016   PROT 6.8 09/17/2017   ALBUMIN 3.8 09/17/2017   BILITOT 0.8 09/17/2017   ALKPHOS 60 09/17/2017   AST 21 09/17/2017   ALT 23 09/17/2017  .   Total Time in preparing paper work, data evaluation and todays exam - 35 minutes  Katha Hamming M.D on 10/04/2017 at 9:21 AM    Note: This dictation was prepared with Dragon dictation along with smaller phrase technology. Any transcriptional errors that result from this process are unintentional.

## 2017-10-04 NOTE — Progress Notes (Signed)
Patient discharge teaching given, including activity, diet, follow-up appoints, and medications. Patient verbalized understanding of all discharge instructions. IV access was d/c'd. Vitals are stable. Skin is intact except as charted in most recent assessments. Pt to be escorted out by RN, to be driven home by family.  [name]  

## 2017-10-10 ENCOUNTER — Ambulatory Visit (INDEPENDENT_AMBULATORY_CARE_PROVIDER_SITE_OTHER): Payer: Medicare Other | Admitting: Family Medicine

## 2017-10-10 ENCOUNTER — Encounter: Payer: Self-pay | Admitting: Family Medicine

## 2017-10-10 VITALS — BP 130/68 | HR 55 | Temp 97.7°F | Wt 201.2 lb

## 2017-10-10 DIAGNOSIS — R413 Other amnesia: Secondary | ICD-10-CM | POA: Diagnosis not present

## 2017-10-10 DIAGNOSIS — H269 Unspecified cataract: Secondary | ICD-10-CM | POA: Diagnosis not present

## 2017-10-10 DIAGNOSIS — F419 Anxiety disorder, unspecified: Secondary | ICD-10-CM | POA: Diagnosis not present

## 2017-10-10 DIAGNOSIS — A045 Campylobacter enteritis: Secondary | ICD-10-CM | POA: Diagnosis not present

## 2017-10-10 DIAGNOSIS — F5104 Psychophysiologic insomnia: Secondary | ICD-10-CM | POA: Diagnosis not present

## 2017-10-10 DIAGNOSIS — I480 Paroxysmal atrial fibrillation: Secondary | ICD-10-CM

## 2017-10-10 DIAGNOSIS — N179 Acute kidney failure, unspecified: Secondary | ICD-10-CM | POA: Diagnosis not present

## 2017-10-10 DIAGNOSIS — I951 Orthostatic hypotension: Secondary | ICD-10-CM | POA: Diagnosis not present

## 2017-10-10 LAB — BASIC METABOLIC PANEL
BUN: 25 mg/dL — ABNORMAL HIGH (ref 6–23)
CO2: 15 mEq/L — ABNORMAL LOW (ref 19–32)
Calcium: 9.1 mg/dL (ref 8.4–10.5)
Chloride: 104 mEq/L (ref 96–112)
Creatinine, Ser: 1.3 mg/dL (ref 0.40–1.50)
GFR: 57.47 mL/min — ABNORMAL LOW (ref >60.00)
Glucose, Bld: 99 mg/dL (ref 70–99)
Potassium: 4.9 mEq/L (ref 3.5–5.1)
Sodium: 135 mEq/L (ref 135–145)

## 2017-10-10 NOTE — Assessment & Plan Note (Signed)
Patient is due for cataract removal.  I do not think he can be surgically cleared at this time given his current issues.  He will need to see his cardiologist prior to surgery.  We will fax this note to his ophthalmology office.

## 2017-10-10 NOTE — Progress Notes (Addendum)
[name], MD Phone: [phone]  [name] is a 72 y.o. male who presents today for hospital follow-up.   Recently hospitalized for dizziness and lightheadedness.  He underwent an extensive evaluation with carotid Dopplers as well as CT head with no obvious cause found.  They discussed doing an MRI brain though he declined this given claustrophobia with MRIs.  They stopped his gabapentin and Zoloft and he discontinued testosterone.  those changes have been beneficial.  He did receive IV fluids.  He was found to be orthostatic as well.  He notes his dizziness has improved.  His orthostasis continues and he notes some lightheadedness on standing up.  They have been measuring his systolic blood pressure at home and it ranges anywhere from 138-160 when lying down and then drops to 100-1 10 when he stands up.  His wife notes his face gets red when this occurs.  He was also treated for Campylobacter related diarrhea with azithromycin.  He has finished that.  He notes no abdominal pain.  No blood in his stool.  Some nausea.  Notes his stomach feels nervous.  No vomiting.  The diarrhea is resolved.  He occasionally feels like there is a very brief palpitation in his chest that goes away within seconds.  No prolonged fluttering.  Notes occasionally having a pinching sensation in his chest as well.  He has follow-up with cardiology in a week and a half.  They note some memory issues where he has trouble remembering things and he asks his wife to repeat things 5-6 times.  They feel like he is shuffling his feet at times.  Does note some depression and anxiety.  He notes no SI.  Social History   Tobacco Use  Smoking Status Former Smoker  . Packs/day: 2.00  . Years: 40.00  . Pack years: 80.00  . Types: Cigarettes  . Last attempt to quit: 10/17/1992  . Years since quitting: 24.9  Smokeless Tobacco Never Used  Tobacco Comment   quit 1994     ROS see history of present  illness  Objective  Physical Exam Vitals:   10/10/17 1141  BP: 130/68  Pulse: (!) 55  Temp: 97.7 F (36.5 C)  SpO2: 98%    BP Readings from Last 3 Encounters:  10/10/17 130/68  10/04/17 124/61  09/27/17 102/64   Wt Readings from Last 3 Encounters:  10/10/17 201 lb 3.2 oz (91.3 kg)  10/02/17 210 lb (95.3 kg)  09/27/17 208 lb 12.8 oz (94.7 kg)    Physical Exam  Constitutional: No distress.  HENT:  Head: Normocephalic and atraumatic.  Mouth/Throat: Oropharynx is clear and moist. No oropharyngeal exudate.  Eyes: Pupils are equal, round, and reactive to light. Conjunctivae are normal.  Cardiovascular: Normal rate, regular rhythm and normal heart sounds.  Pulmonary/Chest: Effort normal and breath sounds normal.  Abdominal: Soft. Bowel sounds are normal. He exhibits no distension. There is no tenderness. There is no rebound and no guarding.  Musculoskeletal: He exhibits no edema.  Neurological: He is alert. Gait normal.  CN 2-12 intact, 5/5 strength in bilateral biceps, triceps, grip, quads, hamstrings, plantar and dorsiflexion, sensation to light touch intact in bilateral UE and LE, normal rapid alternating movements, normal finger to nose testing, normal heel to shin testing  Skin: Skin is warm and dry. He is not diaphoretic.     Assessment/Plan: Please see individual problem list.  Orthostatic hypotension Found to be orthostatic on hospitalization.  Orthostatic hypotension continues at home.  His lightheadedness  and dizziness has improved with discontinuation of gabapentin Zoloft.  I suspect his Flomax is contributing to this.  We will have him discontinue this.  He will monitor his urinary status and if he develops difficulty with urination he will contact us and/or his urologist.  He will see his cardiologist as planned in 1.5 weeks.  I suspect he may need a Holter monitor or a more prolonged monitor to capture the palpitations that he feels though it would seem consistent  with PVCs and those have been captured on EKGs recently.  We will recheck a BMP to check hydration status.  He is given return precautions.  Insomnia His wife expresses concerns regarding insomnia after the patient left the room and went to the lab.  Discussed that melatonin may be an okay option for him to try.  We can discuss further at future visits.  Anxiety Continues to have symptoms of anxiety and depression.  We will discuss potential treatment options with our clinical pharmacist given his reaction to the Zoloft.  Memory difficulty They noted more memory difficulty over a progressive period of time than prior.  Wife also feels like he shuffles his feet at times.  He is neurologically intact.  Discussed MRI though he defers this again.  Will refer to neurology for further evaluation.  PAF (paroxysmal atrial fibrillation) (HCC) Sinus rhythm on exam.  Appears to have PACs and PVCs on most recent EKGs.  He needs to follow-up with his cardiologist to see if a prolonged heart monitor would be necessary to determine if he does go in and out of A. fib.  Campylobacter diarrhea Resolved with treatment.  He will monitor for recurrence.  Some of his nausea and nervous stomach may be postinfectious related.  Cataracts, bilateral Patient is due for cataract removal.  I do not think he can be surgically cleared at this time given his current issues.  He will need to see his cardiologist prior to surgery.  We will fax this note to his ophthalmology office.  Orders Placed This Encounter  Procedures  . Basic Metabolic Panel (BMET)    No orders of the defined types were placed in this encounter.    [name], MD [facility]

## 2017-10-10 NOTE — Assessment & Plan Note (Signed)
Found to be orthostatic on hospitalization.  Orthostatic hypotension continues at home.  His lightheadedness and dizziness has improved with discontinuation of gabapentin Zoloft.  I suspect his Flomax is contributing to this.  We will have him discontinue this.  He will monitor his urinary status and if he develops difficulty with urination he will contact us and/or his urologist.  He will see his cardiologist as planned in 1.5 weeks.  I suspect he may need a Holter monitor or a more prolonged monitor to capture the palpitations that he feels though it would seem consistent with PVCs and those have been captured on EKGs recently.  We will recheck a BMP to check hydration status.  He is given return precautions.

## 2017-10-10 NOTE — Assessment & Plan Note (Signed)
His wife expresses concerns regarding insomnia after the patient left the room and went to the lab.  Discussed that melatonin may be an okay option for him to try.  We can discuss further at future visits.

## 2017-10-10 NOTE — Assessment & Plan Note (Signed)
Continues to have symptoms of anxiety and depression.  We will discuss potential treatment options with our clinical pharmacist given his reaction to the Zoloft.

## 2017-10-10 NOTE — Patient Instructions (Signed)
Nice to see you. Please stop the Flomax and will see if that improves your blood pressure drop.  You should monitor your urination and if that changes please let us or your urologist know. You should not have your cataract surgery. You should see your cardiologist as planned. I will check with our clinical pharmacist to determine the best medication for anxiety and depression for you.

## 2017-10-10 NOTE — Assessment & Plan Note (Signed)
They noted more memory difficulty over a progressive period of time than prior.  Wife also feels like he shuffles his feet at times.  He is neurologically intact.  Discussed MRI though he defers this again.  Will refer to neurology for further evaluation.

## 2017-10-10 NOTE — Assessment & Plan Note (Signed)
Sinus rhythm on exam.  Appears to have PACs and PVCs on most recent EKGs.  He needs to follow-up with his cardiologist to see if a prolonged heart monitor would be necessary to determine if he does go in and out of A. fib.

## 2017-10-10 NOTE — Progress Notes (Signed)
faxed

## 2017-10-10 NOTE — Assessment & Plan Note (Signed)
Resolved with treatment.  He will monitor for recurrence.  Some of his nausea and nervous stomach may be postinfectious related.

## 2017-10-11 ENCOUNTER — Other Ambulatory Visit: Payer: Self-pay | Admitting: Family Medicine

## 2017-10-11 ENCOUNTER — Other Ambulatory Visit (INDEPENDENT_AMBULATORY_CARE_PROVIDER_SITE_OTHER): Payer: Medicare Other

## 2017-10-11 DIAGNOSIS — N179 Acute kidney failure, unspecified: Secondary | ICD-10-CM

## 2017-10-11 LAB — BASIC METABOLIC PANEL
BUN: 23 mg/dL (ref 6–23)
CO2: 26 mEq/L (ref 19–32)
Calcium: 8.8 mg/dL (ref 8.4–10.5)
Chloride: 102 mEq/L (ref 96–112)
Creatinine, Ser: 1.17 mg/dL (ref 0.40–1.50)
GFR: 64.9 mL/min (ref >60.00)
Glucose, Bld: 119 mg/dL — ABNORMAL HIGH (ref 70–99)
Potassium: 3.9 mEq/L (ref 3.5–5.1)
Sodium: 136 mEq/L (ref 135–145)

## 2017-10-11 NOTE — Progress Notes (Signed)
met 

## 2017-10-14 ENCOUNTER — Other Ambulatory Visit: Payer: Medicare Other

## 2017-10-17 ENCOUNTER — Other Ambulatory Visit: Payer: Self-pay | Admitting: Family Medicine

## 2017-10-17 NOTE — Progress Notes (Signed)
Cardiology Office Note   Date:  10/17/2017   ID:  [name], DOB 1946-02-03, MRN [id number]  PCP:  Marc Luis, MD  Cardiologist: Dr. Herbie [name] No chief complaint on file.    History of Present Illness: [name] is a 72 y.o. male who presents for ongoing assessment and management of coronary artery disease with history of CABG. (LIMA to LAD, SVG to diagonal SVG to OM, seqSVG -PDA- dCx).the patient has a long standing history of occlusion of the distal aorta.  He also has a history of paroxysmal atrial fibrillation.  He is currently not on anticoagulation therapy. Also hx of COPD, chronic kidney disease,   He was last seen by Dr. Herbie [name] on 07/02/2017.  At that time he was asymptomatic.  He was having testosterone replacement which he also attributed to helping his energy level.  He remained active is able to do yard work and walk with any exertional symptoms.  He is the father-in-law of [name], who works with [employer].  He is the father in law of [name] who works for [employer].  At that office visit, the patient medications were continued with increase in metoprolol to twice daily.  This dose was 12.5 mg.  If he was unable to tolerate the twice daily dosing the plan was to change it to 12.5 mg succinate dose.  Fasting lipids and LFTs were also ordered on that visit.  Total cholesterol 155;, HDL 36; LDL 63; triglycerides 282.  (10/30/2016  Being seen last, the patient has been admitted to [name] with severe diarrhea.  Stool samples confirmed Campylobacter species, and was told that he had had food poisoning.  This is being followed up by both [name] and [name].  Prior to that he was seen by urgent care due to significant back pain.  He has had recent lithotripsy for nephrolithiasis on 09/23/2017 with stent placement.Marland Kitchen  He was thinking that this was causing his discomfort in his back.   He comes today with complaints of palpitations on occasion,  pinching pain in his left pectoral area when raising his arm and frequent bruising. He  has been medically compliant denies any dizziness nausea vomiting worsening dyspnea on exertion with known history of COPD.  Past Medical History:  Diagnosis Date  . Arthritis   . CAD, multiple vessel 03/2105   LM 65%, mLAD 70%, 90% & 80%, o-pCx 75%, mCx 95% & d Cx 70%, OM1 60%, mRCA 90% with 100% dRCA. R-R collaterals  . Chronic distal aortic occlusion (HCC)    By report  . Chronic kidney disease   . COPD (chronic obstructive pulmonary disease) (HCC)   . Depression   . Enlarged prostate   . Erectile dysfunction   . Essential hypertension   . History of kidney stones    multiple lithotripsies  . Hyperlipidemia LDL goal <70   . Hypogonadism in male    Previously on testosterone injections, but has not yet restarted until further testing  . Muscle pain   . PAF (paroxysmal atrial fibrillation) (HCC) 03/2015   Noted per-cath & post-op CABG.  Dr. Herbie [name], cardiology  . Peripheral vascular disease (HCC)    Bilateral ABIs 0.8  . Renal stones 03/2016  . S/P CABG x 5 04/04/2015   LIMA to LAD, SVG to DIAG, SVG to OM, seqSVG -PDA- dCx  . SOB (shortness of breath)   . 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  Weakness    weakness bilateral legs, s/p vein harvesting for CABPG    Past Surgical History:  Procedure Laterality Date  . BLEPHAROPLASTY    . CARDIAC CATHETERIZATION  1992  . CARDIAC CATHETERIZATION N/A 03/31/2015   Procedure: Left Heart Cath and Coronary Angiography;  Surgeon: Marc Lexavid W Harding, MD;  [facility]: LM 65%, mLAD 70%, 90% & 80%, o-pCx 75%, mCx 95% & d Cx 70%, OM1 60%, mRCA 90% with 100% dRCA. R-R collaterals --> CABG referral. Also noted to be in Afib.  . Carotid Dopplers  12/30/2016   Patent vertebrals. Mild bilateral carotid plaque, <50% stenosis bilaterally.  . CHOLECYSTECTOMY  2000   also had revision of hernia repair  .  CORONARY ARTERY BYPASS GRAFT N/A 04/04/2015   Procedure: CORONARY ARTERY BYPASS GRAFTING (CABG);  Surgeon: Delight OvensEdward B Gerhardt, MD;  [facility];  Service: Open Heart Surgery;  Laterality: N/A;  . CYSTECTOMY     face and scrotum  . CYSTOSCOPY WITH INSERTION OF UROLIFT N/A 11/12/2016   Procedure: CYSTOSCOPY WITH INSERTION OF UROLIFT x4;  Surgeon: Jethro Bolusannenbaum, Sigmund, MD;  Location: [facility] ORS;  Service: Urology;  Laterality: N/A;  . CYSTOSCOPY WITH RETROGRADE PYELOGRAM, URETEROSCOPY AND STENT PLACEMENT Right 06/26/2016   Procedure: CYSTOSCOPY WITH RIGHT RETROGRADE PYELOGRAM, RIGHT URETEROSCOPY , HOLMIUM LASER, BASKET EXTRACTION AND STENT PLACEMENT;  Surgeon: Jethro BolusSigmund Tannenbaum, MD;  Location: [facility] ORS;  Service: Urology;  Laterality: Right;  . CYSTOSCOPY WITH RETROGRADE PYELOGRAM, URETEROSCOPY AND STENT PLACEMENT Left 09/23/2017   Procedure: CYSTOSCOPY WITH LEFT RETROGRADE PYELOGRAM, URETEROSCOPY STONE EXTRACTION AND STENT PLACEMENT;  Surgeon: [name], Marc D III, MD;  Location: [facility] ORS;  Service: Urology;  Laterality: Left;  . Lexiscan Myoview  June 2010   converted to [facility] from treadmill due to inability to reach target heart rate) --the study was normal with no evidence of ischemia and normal LV function. EF 7  . LITHOTRIPSY    . TEE WITHOUT CARDIOVERSION N/A 04/04/2015   Procedure: TRANSESOPHAGEAL ECHOCARDIOGRAM (TEE);  Surgeon: Delight OvensEdward B Gerhardt, MD;  [facility];  Service: Open Heart Surgery;  Laterality: N/A;  . TRANSTHORACIC ECHOCARDIOGRAM  12/30/2016   EF 60-65%. GR 1 DD. Mildly dilated ascending aorta. Otherwise essentially normal with no valve lesions.  Marland Kitchen. VASECTOMY  1975  . VENTRAL HERNIA REPAIR  1994   complicated by infection     Current Outpatient Medications  Medication Sig Dispense Refill  . atorvastatin (LIPITOR) 40 MG tablet Take 1 tablet (40 mg total) by mouth daily. 90 tablet 3  . clonazePAM (KLONOPIN) 0.5 MG tablet Take 0.5 tablets (0.25 mg total) by mouth 2 (two) times  daily as needed for anxiety. 10 tablet 0  . clopidogrel (PLAVIX) 75 MG tablet Take 1 tablet (75 mg total) by mouth daily. 90 tablet 3  . finasteride (PROSCAR) 5 MG tablet Take 1 tablet (5 mg total) by mouth daily. 90 tablet 1  . metoprolol tartrate (LOPRESSOR) 25 MG tablet TAKE 1/2 TABLET TWICE DAILY 90 tablet 3  . NITROSTAT 0.4 MG SL tablet PLACE 1 TABLET (0.4 MG TOTAL) UNDER THE TONGUE EVERY 5 (FIVE) MINUTES AS NEEDED FOR CHEST PAIN. 25 tablet 5  . tamsulosin (FLOMAX) 0.4 MG CAPS capsule Take 0.4 mg by mouth daily.      No current facility-administered medications for this visit.     Allergies:   Patient has no known allergies.    Social History:  The patient  reports that he quit smoking about 25 years ago. His  smoking use included cigarettes. He has a 80.00 pack-year smoking history. He has never used smokeless tobacco. He reports that he drinks about 0.6 oz of alcohol per week. He reports that he does not use drugs.   Family History:  The patient's family history includes Coronary artery disease in his father; Hearing loss in his maternal grandmother and mother; Heart disease in his father; Stroke in his mother and sister.    ROS: All other systems are reviewed and negative. Unless otherwise mentioned in H&P    PHYSICAL EXAM: VS:  There were no vitals taken for this visit. , BMI There is no height or weight on file to calculate BMI. GEN: Well nourished, well developed, in no acute distress  HEENT: normal  Neck: no JVD, carotid bruits, or masses Cardiac: RRR; occasional extrasystole, no murmurs, rubs, or gallops,no edema  Respiratory:  clear to auscultation bilaterally, normal work of breathing GI: soft, nontender, nondistended, + BS MS: no deformity or atrophy  Skin: warm and dry, no rash Neuro:  Strength and sensation are intact Psych: euthymic mood, full affect   EKG: Reviewed from recent hospitalization at [name], dated 10/03/2017 revealing normal sinus rhythm  with occasional PACs and PVCs, evidence of prior anterior infarct.  Recent Labs: 09/17/2017: ALT 23 09/27/2017: TSH 3.19 10/03/2017: Hemoglobin 14.9; Platelets 184 10/11/2017: BUN 23; Creatinine, Ser 1.17; Potassium 3.9; Sodium 136    Lipid Panel    Component Value Date/Time   CHOL 155 10/30/2016 0326   TRIG 282 (H) 10/30/2016 0326   HDL 36 (L) 10/30/2016 0326   CHOLHDL 4.3 10/30/2016 0326   VLDL 56 (H) 10/30/2016 0326   LDLCALC 63 10/30/2016 0326      Wt Readings from Last 3 Encounters:  10/10/17 201 lb 3.2 oz (91.3 kg)  10/02/17 210 lb (95.3 kg)  09/27/17 208 lb 12.8 oz (94.7 kg)      Other studies Reviewed: Echocardiogram 01-20-2017 - Left ventricle: The cavity size was normal. Systolic function was   normal. The estimated ejection fraction was in the range of 60%   to 65%. Wall motion was normal; there were no regional wall   motion abnormalities. Doppler parameters are consistent with   abnormal left ventricular relaxation (grade 1 diastolic   dysfunction). - Aortic valve: Valve area (Vmax): 1.84 cm^2. - Ascending aorta: The ascending aorta was mildly dilated. - Left atrium: The atrium was at the upper limits of normal in   size. - Right ventricle: Systolic function was normal. - Pulmonary arteries: Systolic pressure was within the normal   range.  CABG 03/31/2015 Conclusion   1. Severe multivessel disease with heavy calcification of the proximal left and right coronary arteries. 2. LM lesion, 65% stenosed. 3. Mid LAD-1 lesion, 70% stenosed. Mid LAD-2 lesion, 90% stenosed prior to D1. Distmid LAD lesion, 80% stenosed. 4. Ost Cx to Prox Cx lesion, 75% stenosed. Mid Cx lesion, 95% stenosed. Dist Cx lesion, 70% stenosed. 1st Mrg lesion, 60% stenosed wtih 80% in smaller OM1a branch 5. Mid RCA lesion, 90% stenosed. Mid RCA to Dist RCA lesion, 100% stenosed - CTO. There is R-R collateralization from the RV Marginal branch to RPDA faintly filling the RPDA & small  RPL. 6. The left ventricular systolic function is normal. 7. New onset Diagnosis of Afib - initially with RVR   Echocardiogram: 10/03/2017 Left ventricle: The cavity size was normal. Systolic function was   normal. The estimated ejection fraction was in the range of 60%   to 65%.  Wall motion was normal; there were no regional wall   motion abnormalities. Doppler parameters are consistent with   abnormal left ventricular relaxation (grade 1 diastolic   dysfunction). - Aortic valve: Valve area (Vmax): 1.84 cm^2. - Ascending aorta: The ascending aorta was mildly dilated. - Left atrium: The atrium was at the upper limits of normal in   size. - Right ventricle: Systolic function was normal. - Pulmonary arteries: Systolic pressure was within the normal   range.  ASSESSMENT AND PLAN:  1.  Coronary artery disease: History of coronary artery bypass grafting.  The patient denies any recurrent chest pain on exertion, he continues to have some mild dyspnea on exertion, he also complains of some sharp pinching pain in his pectoral area on the left when he lifts his arm or crosses the front of his chest to scratch his right shoulder.  I believe this is musculoskeletal in etiology, and not cardiac related.  He may need to see his PCP to evaluate his left shoulder, although this is only occurring with abduction and lifting his arm above his head.  2.  Frequent palpitations: The patient was scheduled to have a Holter monitor a couple years ago but he did not know how to apply it or use it, states that he was not given instructions sent it back.  He is currently on metoprolol 25 mg 1/2 tablet twice daily.  I told him that if he has recurrence of palpitations lasting for several minutes he was okay to take an additional half a tablet of metoprolol.  If this does occur more often we may need to adjust the dose to 25 mg in the morning and continue 12.5 mg in the evening if this is increasing in frequency.  He  refuses repeat cardiac monitor.  3.  History of COPD: He is currently not followed by a pulmonologist is dyspnea on exertion is not severe and therefore wishes to continue with PCP for ongoing management at this time.  4.  Food poisoning: Recent admission to [facility] in the setting of abdominal pain and nausea and significant diarrhea.  The patient was diagnosed with Campylobacter species and treated for this.  The patient is being followed by PCP, and [name] department reporting of incident.  4.  Chronic kidney stones: Recent lithotripsy with stent placement on 09/23/2017.  To be followed by nephrology.   Current medicines are reviewed at length with the patient today.    Labs/ tests ordered today include: None Bettey Mare. Liborio Nixon, ANP, AACC   10/17/2017 11:05 AM    [facility] [address] [place], [place] [address] Phone: [phone]; Fax: [phone]

## 2017-10-21 ENCOUNTER — Ambulatory Visit (INDEPENDENT_AMBULATORY_CARE_PROVIDER_SITE_OTHER): Payer: Medicare Other | Admitting: Adult Health

## 2017-10-21 ENCOUNTER — Encounter: Payer: Self-pay | Admitting: Adult Health

## 2017-10-21 VITALS — BP 118/68 | HR 70 | Ht 69.0 in | Wt 199.2 lb

## 2017-10-21 DIAGNOSIS — I10 Essential (primary) hypertension: Secondary | ICD-10-CM

## 2017-10-21 DIAGNOSIS — I480 Paroxysmal atrial fibrillation: Secondary | ICD-10-CM | POA: Diagnosis not present

## 2017-10-21 DIAGNOSIS — I2510 Atherosclerotic heart disease of native coronary artery without angina pectoris: Secondary | ICD-10-CM

## 2017-10-21 NOTE — Patient Instructions (Signed)
Medication Instructions:  NO CHANGES- Your physician recommends that you continue on your current medications as directed. Please refer to the Current Medication list given to you today.  If you need a refill on your cardiac medications before your next appointment, please call your pharmacy.  Follow-Up: Your physician wants you to follow-up in: 4 months with Dr Herbie BaltimoreHarding.   Thank you for choosing [facility]!!

## 2017-10-23 ENCOUNTER — Ambulatory Visit: Payer: Medicare Other | Admitting: Family Medicine

## 2017-10-23 ENCOUNTER — Telehealth: Payer: Self-pay | Admitting: Cardiology

## 2017-10-23 NOTE — Telephone Encounter (Signed)
   [facility] Pre-operative Risk Assessment    Request for surgical clearance:  1. What type of surgery is being performed? Simple extraction of 2 teeth   2. When is this surgery scheduled? TBD   3. What type of clearance is required (medical clearance vs. Pharmacy clearance to hold med vs. Both)? Both   4. Are there any medications that need to be held prior to surgery and how long? Not specified but patient is on Plavix         5. Practice name and name of physician performing surgery? Gaylia C. Tamala Julian, DDS @ [facility]   6. What is your office phone number [phone])   7.  What is your office fax number [phone])  8.   Anesthesia type (None, local, MAC, general) ? Local anesthetic (with epinephrine)   Also request to address: - if patient needs antibiotic prophylaxis - is so, what type is allowed/recommended?  - any restrictions for anesthetic? - is use of epinephrine OK?  - type of pain medications that is allowed/recommended?   Sheral Apley M 10/23/2017, 3:25 PM  _________________________________________________________________   (provider comments below)

## 2017-10-24 ENCOUNTER — Ambulatory Visit: Admit: 2017-10-24 | Payer: Medicare Other | Admitting: Ophthalmology

## 2017-10-24 SURGERY — PHACOEMULSIFICATION, CATARACT, WITH IOL INSERTION
Anesthesia: Choice | Laterality: Left

## 2017-10-24 NOTE — Telephone Encounter (Signed)
Routed to Dr. Herbie BaltimoreHarding for plavix recommendations.  Would recommend against epinephrin given his history of palpitations.   Will notify provider's office once plavix recommendations are received.

## 2017-10-24 NOTE — Telephone Encounter (Signed)
OK to hold plavix. Bryan Lemmaavid Harding, MD

## 2017-10-25 NOTE — Telephone Encounter (Signed)
   Primary Cardiologist: Bryan Lemmaavid Harding, MD  Chart reviewed as part of pre-operative protocol coverage. Given past medical history and time since last visit, based on ACC/AHA guidelines, [name] would be at acceptable risk for the planned procedure without further cardiovascular testing.   Per Dr. Herbie BaltimoreHarding, OK to hold plavix for 5 days prior to extraction. Resume as soon after procedure as possible. Avoid epinephrine for history of palpitations.  I will route this recommendation to the requesting party via Epic fax function and remove from pre-op pool.  Please call with questions.  Roe RutherfordAngela Nicole Duke, PA 10/25/2017, 2:40 PM

## 2017-10-31 DIAGNOSIS — E291 Testicular hypofunction: Secondary | ICD-10-CM | POA: Diagnosis not present

## 2017-10-31 DIAGNOSIS — N202 Calculus of kidney with calculus of ureter: Secondary | ICD-10-CM | POA: Diagnosis not present

## 2017-11-12 ENCOUNTER — Ambulatory Visit (INDEPENDENT_AMBULATORY_CARE_PROVIDER_SITE_OTHER): Payer: Medicare Other | Admitting: Pulmonary Disease

## 2017-11-12 ENCOUNTER — Encounter: Payer: Self-pay | Admitting: Pulmonary Disease

## 2017-11-12 VITALS — BP 108/72 | HR 82 | Ht 69.0 in | Wt 190.0 lb

## 2017-11-12 DIAGNOSIS — J449 Chronic obstructive pulmonary disease, unspecified: Secondary | ICD-10-CM | POA: Diagnosis not present

## 2017-11-12 DIAGNOSIS — R0609 Other forms of dyspnea: Secondary | ICD-10-CM

## 2017-11-12 DIAGNOSIS — I2510 Atherosclerotic heart disease of native coronary artery without angina pectoris: Secondary | ICD-10-CM | POA: Diagnosis not present

## 2017-11-12 DIAGNOSIS — Z87891 Personal history of nicotine dependence: Secondary | ICD-10-CM

## 2017-11-12 DIAGNOSIS — R0600 Dyspnea, unspecified: Secondary | ICD-10-CM

## 2017-11-12 MED ORDER — ALBUTEROL SULFATE HFA 108 (90 BASE) MCG/ACT IN AERS: 2.0000 | INHALATION_SPRAY | Freq: Four times a day (QID) | RESPIRATORY_TRACT | 5 refills | Status: DC | PRN

## 2017-11-12 NOTE — Progress Notes (Signed)
PULMONARY OFFICE FOLLOW UP NOTE  PT IDENTIFICATION: 72 y.o. M former smoker with mild-mod COPD, previously seen be Dr Dema Severin  PROBLEMS:  Former smoker - quit 1994 COPD - mild to moderate by PFTs CAD - S/P CABG Tyrone Sage) 03/2015 Incidental finding of lung nodule on CT chest 01/23/16 - very small and likely benign  DATA: Spirometry 04/01/15: FEV1 1.92 liters (61% pred), FVC 2.97 L (70% pred) Spirometry 04/16/16: FEV1 1.99 L (64% pred), 2.95 L (70% pred) CT chest 01/23/16: Moderate to severe emphysema, with 4 mm nonspecific ground-glass nodule in the right middle lobe (low likelihood of malignancy) CT chest 09/17/17: No acute findings  INTERVAL HISTORY: Several hospitalizations for non-pulmonary problems dizziness and "food poisoning".  SUBJ: This is a routine reevaluation.  Since last seen, he has had no major pulmonary problems or events.  He is on no maintenance therapy.  He does not keep an albuterol inhaler.  He continues to have mild exertional dyspnea.  He is able to walk long distances on flat ground but has problems with inclines.  He denies CP, fever, purulent sputum, hemoptysis, LE edema and calf tenderness.   OBJ: Vitals:   11/12/17 1325 11/12/17 1329  BP:  108/72  Pulse:  82  SpO2:  98%  Weight: 190 lb (86.2 kg)   Height:  (1.753 m)    NAD Gen: NAD HEENT: NCAT, sclera white Neck: No JVD Lungs: breath sounds full, no wheezes, faint bibasilar crackles Cardiovascular: RRR, no murmurs Abdomen: Soft, nontender, normal BS Ext: without clubbing, cyanosis, edema Neuro: grossly intact Skin: Limited exam, no lesions noted    DATA: No new labs or chest x-ray  IMPRESSION: Former smoker  COPD, moderate (HCC)  Mild DOE   His exertional dyspnea correlates with findings on CT scan (moderate emphysema) and PFTs (mild to moderate obstruction).  He has little day-to-day variation.  He has not responded to trials of long-acting bronchodilator therapy in the  past.  Since he quit smoking more than 15 years ago, he is no longer considered a candidate for low-dose CT scan screening.  PLAN: Needed for increased shortness of breath, wheezing, cough, chest tightness  Follow-up as needed   Billy Fischer, MD PCCM service Mobile [phone] Pager [phone] 11/12/2017 1:58 PM

## 2017-11-12 NOTE — Patient Instructions (Addendum)
Continue albuterol inhaler as needed for increased shortness of breath, wheezing, cough, chest tightness.  I have placed a prescription for this medication  Follow-up as needed

## 2017-11-20 ENCOUNTER — Ambulatory Visit: Payer: Medicare Other

## 2017-11-20 ENCOUNTER — Telehealth: Payer: Self-pay | Admitting: Family Medicine

## 2017-11-20 MED ORDER — GABAPENTIN 100 MG PO CAPS: 100.0000 mg | ORAL_CAPSULE | Freq: Two times a day (BID) | ORAL | 3 refills | Status: DC

## 2017-11-20 NOTE — Telephone Encounter (Signed)
Pt would like to know if pt can be put back on the gabapintin. Pt would like to try the gabapintin again. Pt has two different dosages 500 and then 100 mg. Please advise? Pt has been off of those two medications of Gabapintin and Flomax. Thank you!  Call pt @ [phone].

## 2017-11-20 NOTE — Telephone Encounter (Signed)
Patient states it was for his hip pain , it was discontinued to see if it was causing the light headedness. Patient states he  feels better with the lightheadedness but still has some episodes. Patient states his hips are bothering him. Patient states he will discontinue if he starts having increased lightheadedness. Patient was taking gabapentin 100 mg in the morning and 100 mg at night. He does not need a refill if he is able to go back on it.

## 2017-11-20 NOTE — Telephone Encounter (Signed)
Please advise 

## 2017-11-20 NOTE — Telephone Encounter (Signed)
We can trial him going back on this.  He can go back on 100 mg twice daily of gabapentin.  If he has any lightheadedness he needs to discontinue it.  Thanks.

## 2017-11-20 NOTE — Telephone Encounter (Signed)
Please confirm what he was taking this for.  Please also confirm why this was discontinued.  Please also ask why he feels he needs to go back on this.  Thanks.

## 2017-11-20 NOTE — Telephone Encounter (Signed)
Patient notified

## 2017-12-02 ENCOUNTER — Telehealth: Payer: Self-pay | Admitting: *Deleted

## 2017-12-02 ENCOUNTER — Other Ambulatory Visit: Payer: Self-pay | Admitting: *Deleted

## 2017-12-02 DIAGNOSIS — E785 Hyperlipidemia, unspecified: Secondary | ICD-10-CM

## 2017-12-02 DIAGNOSIS — I25119 Atherosclerotic heart disease of native coronary artery with unspecified angina pectoris: Secondary | ICD-10-CM

## 2017-12-02 NOTE — Telephone Encounter (Addendum)
-----   Message from Tobin Chad, RN sent at 07/02/2017  2:19 PM EST ----- NEED LABS DONE BEFORE 12/31/17 CMP LIPID MAY /18/19

## 2017-12-02 NOTE — Telephone Encounter (Signed)
Letter and lab slip mailed.

## 2017-12-11 DIAGNOSIS — I25119 Atherosclerotic heart disease of native coronary artery with unspecified angina pectoris: Secondary | ICD-10-CM | POA: Diagnosis not present

## 2017-12-11 DIAGNOSIS — E785 Hyperlipidemia, unspecified: Secondary | ICD-10-CM | POA: Diagnosis not present

## 2017-12-12 LAB — LIPID PANEL
Chol/HDL Ratio: 4 ratio (ref 0.0–5.0)
Cholesterol, Total: 160 mg/dL (ref 100–199)
HDL: 40 mg/dL (ref >39)
LDL Calculated: 78 mg/dL (ref 0–99)
Triglycerides: 208 mg/dL — ABNORMAL HIGH (ref 0–149)
VLDL Cholesterol Cal: 42 mg/dL — ABNORMAL HIGH (ref 5–40)

## 2017-12-12 LAB — COMPREHENSIVE METABOLIC PANEL
ALT: 19 IU/L (ref 0–44)
AST: 13 IU/L (ref 0–40)
Albumin/Globulin Ratio: 1.8 (ref 1.2–2.2)
Albumin: 4.2 g/dL (ref 3.5–4.8)
Alkaline Phosphatase: 77 IU/L (ref 39–117)
BUN/Creatinine Ratio: 21 (ref 10–24)
BUN: 23 mg/dL (ref 8–27)
Bilirubin Total: 0.4 mg/dL (ref 0.0–1.2)
CO2: 21 mmol/L (ref 20–29)
Calcium: 9.4 mg/dL (ref 8.6–10.2)
Chloride: 104 mmol/L (ref 96–106)
Creatinine, Ser: 1.12 mg/dL (ref 0.76–1.27)
GFR calc Af Amer: 75 mL/min/{1.73_m2} (ref >59)
GFR calc non Af Amer: 65 mL/min/{1.73_m2} (ref >59)
Globulin, Total: 2.3 g/dL (ref 1.5–4.5)
Glucose: 153 mg/dL — ABNORMAL HIGH (ref 65–99)
Potassium: 4.2 mmol/L (ref 3.5–5.2)
Sodium: 142 mmol/L (ref 134–144)
Total Protein: 6.5 g/dL (ref 6.0–8.5)

## 2017-12-25 ENCOUNTER — Telehealth: Payer: Self-pay | Admitting: *Deleted

## 2017-12-25 DIAGNOSIS — E785 Hyperlipidemia, unspecified: Secondary | ICD-10-CM

## 2017-12-25 DIAGNOSIS — Z79899 Other long term (current) drug therapy: Secondary | ICD-10-CM

## 2017-12-25 MED ORDER — ICOSAPENT ETHYL 1 G PO CAPS: 1.0000 g | ORAL_CAPSULE | Freq: Two times a day (BID) | ORAL | 3 refills | Status: DC

## 2017-12-25 NOTE — Telephone Encounter (Signed)
Spoke to patient. Result given . Verbalized understanding  prescription e-sent  For 90 day supply per patient  order placed for labs to be obtain in 3- 4 months

## 2017-12-25 NOTE — Telephone Encounter (Signed)
-----   Message from Marykay Lexavid W Harding, MD sent at 12/24/2017  2:15 PM EDT ----- Chemistry panel shows elevated glucose level but otherwise normal kidney and liver function.  Normal potassium and sodium levels. Cholesterol panel shows total cholesterol 160, triglycerides (TG) 208 (high), LDL 78 (pretty good, but not quite at goal), HDL 40 (just at goal).  Elevated triglycerides are related to consumption of fatty foods, more specifically high starchy foods.  Lowering triglycerides will help LDL lowering as well. -->  Would like to try vascepa 1 g bid (will help both TG & LDL.  Recheck labs in ~3-4 months.  Bryan Lemmaavid Harding, MD

## 2017-12-30 NOTE — Progress Notes (Signed)
[name] [address] [place], [place] [address] Phone: [phone] Subjective:     CC: Bilateral hip pain  BJY:[id number]  [name] is a 72 y.o. male coming in with complaint of left hip pain greater than the right. Pain is also radiating down to the left knee. Pain is constant. Patient has been using tylenol arthritis for his pain. Patient has had some kidney stones since last visit. Does complain of pain in the lower back as well as his hips.    Patient has been in the hospital multiple times recently for kidney stones.  Patient at that time did have a CT scan of the abdomen pelvis.  This was reviewed by me showing the patient did have moderate to severe degenerative disc disease of the lumbar spine especially at L4-L5.      Past Medical History:  Diagnosis Date  . Arthritis   . CAD, multiple vessel 03/2105   LM 65%, mLAD 70%, 90% & 80%, o-pCx 75%, mCx 95% & d Cx 70%, OM1 60%, mRCA 90% with 100% dRCA. R-R collaterals  . Chronic distal aortic occlusion (HCC)    By report  . Chronic kidney disease   . COPD (chronic obstructive pulmonary disease) (HCC)   . Depression   . Enlarged prostate   . Erectile dysfunction   . Essential hypertension   . History of kidney stones    multiple lithotripsies  . Hyperlipidemia LDL goal <70   . Hypogonadism in male    Previously on testosterone injections, but has not yet restarted until further testing  . Muscle pain   . PAF (paroxysmal atrial fibrillation) (HCC) 03/2015   Noted per-cath & post-op CABG.  Dr. [name], cardiology  . Peripheral vascular disease (HCC)    Bilateral ABIs 0.8  . Renal stones 03/2016  . S/P CABG x 5 04/04/2015   LIMA to LAD, SVG to DIAG, SVG to OM, seqSVG -PDA- dCx  . SOB (shortness of breath)   . 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 Weakness    weakness bilateral legs, s/p vein harvesting for CABPG   Past Surgical History:    Procedure Laterality Date  . BLEPHAROPLASTY    . CARDIAC CATHETERIZATION  1992  . CARDIAC CATHETERIZATION N/A 03/31/2015   Procedure: Left Heart Cath and Coronary Angiography;  Surgeon: Marykay Lex, MD;  Location: [facility] INVASIVE CV LAB: LM 65%, mLAD 70%, 90% & 80%, o-pCx 75%, mCx 95% & d Cx 70%, OM1 60%, mRCA 90% with 100% dRCA. R-R collaterals --> CABG referral. Also noted to be in Afib.  . Carotid Dopplers  12/30/2016   Patent vertebrals. Mild bilateral carotid plaque, <50% stenosis bilaterally.  . CHOLECYSTECTOMY  2000   also had revision of hernia repair  . CORONARY ARTERY BYPASS GRAFT N/A 04/04/2015   Procedure: CORONARY ARTERY BYPASS GRAFTING (CABG);  Surgeon: [name], MD;  Location[facility] OR;  Service: Open Heart Surgery;  Laterality: N/A;  . CYSTECTOMY     face and scrotum  . CYSTOSCOPY WITH INSERTION OF UROLIFT N/A 11/12/2016   Procedure: CYSTOSCOPY WITH INSERTION OF UROLIFT x4;  Surgeon: Jethro Bolus, MD;  Location: [facility] ORS;  Service: Urology;  Laterality: N/A;  . CYSTOSCOPY WITH RETROGRADE PYELOGRAM, URETEROSCOPY AND STENT PLACEMENT Right 06/26/2016   Procedure: CYSTOSCOPY WITH RIGHT RETROGRADE PYELOGRAM, RIGHT URETEROSCOPY , HOLMIUM LASER, BASKET EXTRACTION AND STENT PLACEMENT;  Surgeon: Jethro Bolus, MD;  Location: [facility] ORS;  Service: Urology;  Laterality: Right;  . CYSTOSCOPY WITH RETROGRADE PYELOGRAM, URETEROSCOPY AND STENT PLACEMENT Left 09/23/2017   Procedure: CYSTOSCOPY WITH LEFT RETROGRADE PYELOGRAM, URETEROSCOPY STONE EXTRACTION AND STENT PLACEMENT;  Surgeon: Crista ElliotBell, Eugene D III, MD;  Location: [facility] ORS;  Service: Urology;  Laterality: Left;  . Lexiscan Myoview  June 2010   [facility] inability to reach target heart rate) --the study was normal with no evidence of ischemia and normal LV function. EF 7  . LITHOTRIPSY    . TEE WITHOUT CARDIOVERSION N/A 04/04/2015   Procedure: TRANSESOPHAGEAL ECHOCARDIOGRAM (TEE);  Surgeon: Delight OvensEdward B  Gerhardt, MD;  [facility];  Service: Open Heart Surgery;  Laterality: N/A;  . TRANSTHORACIC ECHOCARDIOGRAM  12/30/2016   EF 60-65%. GR 1 DD. Mildly dilated ascending aorta. Otherwise essentially normal with no valve lesions.  [name]. VASECTOMY  1975  . VENTRAL HERNIA REPAIR  1994   complicated by infection   Social History   Socioeconomic History  . Marital status: Married    Spouse name: Not on file  . Number of children: 2  . Years of education: 2414  . Highest education level: Not on file  Occupational History  . Not on file  Social Needs  . Financial resource strain: Not on file  . Food insecurity:    Worry: Not on file    Inability: Not on file  . Transportation needs:    Medical: Not on file    Non-medical: Not on file  Tobacco Use  . Smoking status: Former Smoker    Packs/day: 2.00    Years: 40.00    Pack years: 80.00    Types: Cigarettes    Last attempt to quit: 10/17/1992    Years since quitting: 25.2  . Smokeless tobacco: Never Used  . Tobacco comment: quit 1994  Substance and Sexual Activity  . Alcohol use: Yes    Alcohol/week: 0.6 oz    Types: 1 Standard drinks or equivalent per week    Comment: seldom - vodka or wine  . Drug use: No  . Sexual activity: Never  Lifestyle  . Physical activity:    Days per week: Not on file    Minutes per session: Not on file  . Stress: Not on file  Relationships  . Social connections:    Talks on phone: Not on file    Gets together: Not on file    Attends religious service: Not on file    Active member of club or organization: Not on file    Attends meetings of clubs or organizations: Not on file    Relationship status: Not on file  Other Topics Concern  . Not on file  Social History Narrative   [place] No pets   Two children.      Former smoker. Quit in 1994. Prior to quitting was unable to walk without exertional dyspnea.      Work - Retired. 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      Hobbies - reading, drives Corvette, chuch   No Known Allergies Family History  Problem Relation Age of Onset  . Coronary artery disease Father   . Heart disease Father   . Stroke Mother   . Hearing loss Mother   . Stroke Sister   . Hearing loss Maternal Grandmother   . Prostate cancer Neg Hx   . Kidney disease Neg Hx   .  Other Neg Hx        hypogonadism     Past medical history, social, surgical and family history all reviewed in electronic medical record.  No pertanent information unless stated regarding to the chief complaint.   Review of Systems:Review of systems updated and as accurate as of 12/31/17  No headache, visual changes, nausea, vomiting, diarrhea, constipation, dizziness, abdominal pain, skin rash, fevers, chills, night sweats, weight loss, swollen lymph nodes, , chest pain, shortness of breath, mood changes.  Positive muscle aches, body aches  Objective  Blood pressure 120/66, pulse 74, height 5\' 9"  (1.753 m), weight 197 lb (89.4 kg), SpO2 98 %. Systems examined below as of 12/31/17   General: No apparent distress alert and oriented x3 mood and affect normal, dressed appropriately.  HEENT: Pupils equal, extraocular movements intact  Respiratory: Patient's speak in full sentences and does not appear short of breath  Cardiovascular: No lower extremity edema, non tender, no erythema  Skin: Warm dry intact with no signs of infection or rash on extremities or on axial skeleton.  Abdomen: Soft nontender  Neuro: Cranial nerves II through XII are intact, neurovascularly intact in all extremities with 2+ DTRs  Lymph: No lymphadenopathy of posterior or anterior cervical chain or axillae bilaterally.  Gait antalgic MSK:  Non tender with full range of motion and good stability and symmetric strength and tone of shoulders, elbows, wrist,  knee and ankles bilaterally.  Arthritic changes of multiple joints Bilateral  hips has significant tenderness over the greater trochanteric patient does have some mild limitation in internal rotation bilaterally as well.  Significant tightness with straight leg test but no radicular symptoms.  Unable to do [facility] test secondary to tightness.  1+ distal pulses noted of the posterior tibialis and dorsalis pedis   Procedure: Real-time Ultrasound Guided Injection of right greater trochanteric bursitis secondary to patient's body habitus Device: GE Logiq Q7 Ultrasound guided injection is preferred based studies that show increased duration, increased effect, greater accuracy, decreased procedural pain, increased response rate, and decreased cost with ultrasound guided versus blind injection.  Verbal informed consent obtained.  Time-out conducted.  Noted no overlying erythema, induration, or other signs of local infection.  Skin prepped in a sterile fashion.  Local anesthesia: Topical Ethyl chloride.  With sterile technique and under real time ultrasound guidance:  Greater trochanteric area was visualized and patient's bursa was noted. A 22-gauge 3 inch needle was inserted and 4 cc of 0.5% Marcaine and 1 cc of Kenalog 40 mg/dL was injected. Pictures taken Completed without difficulty  Pain immediately resolved suggesting accurate placement of the medication.  Advised to call if fevers/chills, erythema, induration, drainage, or persistent bleeding.  Images permanently stored and available for review in the ultrasound unit.  Impression: Technically successful ultrasound guided injection.   Procedure: Real-time Ultrasound Guided Injection of left  greater trochanteric bursitis secondary to patient's body habitus Device: GE Logiq Q7  Ultrasound guided injection is preferred based studies that show increased duration, increased effect, greater accuracy, decreased procedural pain, increased response rate, and decreased cost with ultrasound guided versus blind injection.  Verbal  informed consent obtained.  Time-out conducted.  Noted no overlying erythema, induration, or other signs of local infection.  Skin prepped in a sterile fashion.  Local anesthesia: Topical Ethyl chloride.  With sterile technique and under real time ultrasound guidance:  Greater trochanteric area was visualized and patient's bursa was noted. A 22-gauge 3 inch needle was inserted and 4 cc of 0.5% Marcaine  and 1 cc of Kenalog 40 mg/dL was injected. Pictures taken Completed without difficulty  Pain immediately resolved suggesting accurate placement of the medication.  Advised to call if fevers/chills, erythema, induration, drainage, or persistent bleeding.  Images permanently stored and available for review in the ultrasound unit.  Impression: Technically successful ultrasound guided injection.   Impression and Recommendations:     This case required medical decision making of moderate complexity.      Note: This dictation was prepared with Dragon dictation along with smaller phrase technology. Any transcriptional errors that result from this process are unintentional.

## 2017-12-31 ENCOUNTER — Encounter: Payer: Self-pay | Admitting: Family Medicine

## 2017-12-31 ENCOUNTER — Ambulatory Visit (INDEPENDENT_AMBULATORY_CARE_PROVIDER_SITE_OTHER): Payer: Medicare Other | Admitting: Family Medicine

## 2017-12-31 ENCOUNTER — Ambulatory Visit: Payer: Self-pay

## 2017-12-31 VITALS — BP 120/66 | HR 74 | Ht 69.0 in | Wt 197.0 lb

## 2017-12-31 DIAGNOSIS — M5416 Radiculopathy, lumbar region: Secondary | ICD-10-CM

## 2017-12-31 DIAGNOSIS — M7062 Trochanteric bursitis, left hip: Secondary | ICD-10-CM

## 2017-12-31 DIAGNOSIS — M25552 Pain in left hip: Secondary | ICD-10-CM

## 2017-12-31 DIAGNOSIS — M7061 Trochanteric bursitis, right hip: Secondary | ICD-10-CM | POA: Diagnosis not present

## 2017-12-31 DIAGNOSIS — M25551 Pain in right hip: Secondary | ICD-10-CM

## 2017-12-31 DIAGNOSIS — I25119 Atherosclerotic heart disease of native coronary artery with unspecified angina pectoris: Secondary | ICD-10-CM

## 2017-12-31 NOTE — Assessment & Plan Note (Signed)
Bilateral greater trochanteric injection today.  Discussed icing regimen and home exercise.  Discussed which activities of doing which wants to avoid.  Patient is to increase activity as tolerated.  Follow-up with me again in 4 weeks.  Differential includes a lumbar radiculopathy and if any worsening symptoms consider epidural in the back.  CT scan confirmed degenerative disc disease.

## 2017-12-31 NOTE — Patient Instructions (Addendum)
Good to see you  I am sorry you are hurting  A little of a balancing act  We injected both hips again today  If not better in a week and call me and we will try injection in your back  Otherwise again in 3months

## 2018-01-20 ENCOUNTER — Ambulatory Visit (INDEPENDENT_AMBULATORY_CARE_PROVIDER_SITE_OTHER): Payer: Medicare Other

## 2018-01-20 ENCOUNTER — Ambulatory Visit (INDEPENDENT_AMBULATORY_CARE_PROVIDER_SITE_OTHER): Payer: Medicare Other | Admitting: Family Medicine

## 2018-01-20 ENCOUNTER — Encounter: Payer: Self-pay | Admitting: Family Medicine

## 2018-01-20 VITALS — BP 118/70 | HR 94 | Temp 97.5°F | Resp 14 | Ht 69.0 in | Wt 195.8 lb

## 2018-01-20 DIAGNOSIS — I951 Orthostatic hypotension: Secondary | ICD-10-CM

## 2018-01-20 DIAGNOSIS — R42 Dizziness and giddiness: Secondary | ICD-10-CM

## 2018-01-20 DIAGNOSIS — I25119 Atherosclerotic heart disease of native coronary artery with unspecified angina pectoris: Secondary | ICD-10-CM

## 2018-01-20 DIAGNOSIS — J309 Allergic rhinitis, unspecified: Secondary | ICD-10-CM | POA: Diagnosis not present

## 2018-01-20 DIAGNOSIS — N138 Other obstructive and reflux uropathy: Secondary | ICD-10-CM

## 2018-01-20 DIAGNOSIS — N401 Enlarged prostate with lower urinary tract symptoms: Secondary | ICD-10-CM | POA: Diagnosis not present

## 2018-01-20 DIAGNOSIS — Z0000 Encounter for general adult medical examination without abnormal findings: Secondary | ICD-10-CM | POA: Diagnosis not present

## 2018-01-20 NOTE — Patient Instructions (Addendum)
Nice to see you. I would suggest we have you see neurology for your dizziness. I believe the Flomax and Proscar could be contributing to your intermittent lightheadedness.  Please discuss these medications with your urologist to see if there are alternative options. You can try Claritin to see if that will help with your nasal congestion and headaches. If you develop worsening headaches, or you develop numbness, weakness, vision changes, or any new or changing symptoms please seek medical attention immediately.

## 2018-01-20 NOTE — Patient Instructions (Addendum)
  [name] , Thank you for taking time to come for your Medicare Wellness Visit. I appreciate your ongoing commitment to your health goals. Please review the following plan we discussed and let me know if I can assist you in the future.   These are the goals we discussed: Goals    . Increase physical activity     Walk for exercise       This is a list of the screening recommended for you and due dates:  Health Maintenance  Topic Date Due  . Complete foot exam   06/30/1956  . Urine Protein Check  06/30/1956  . Tetanus Vaccine  07/01/1963  . Eye exam for diabetics  06/26/2017  . Hemoglobin A1C  01/20/2018  . Flu Shot  02/13/2018  . Colon Cancer Screening  12/17/2022  .  Hepatitis C: One time screening is recommended by Center for Disease Control  (CDC) for  adults born from [birth year] through 1967.   Completed  . Pneumonia vaccines  Completed

## 2018-01-20 NOTE — Progress Notes (Signed)
Subjective:   [name] is a 72 y.o. male who presents for Medicare Annual/Subsequent preventive examination.  Review of Systems:  No ROS.  Medicare Wellness Visit. Additional risk factors are reflected in the social history.  Cardiac Risk Factors include: advanced age (>74men, >97 women);male gender;hypertension     Objective:    Vitals: BP 118/70 (BP Location: Left Arm, Patient Position: Sitting, Cuff Size: Normal)   Pulse 94   Temp (!) 97.5 F (36.4 C) (Oral)   Resp 14   Ht 5\' 9"  (1.753 m)   Wt 195 lb 12.8 oz (88.8 kg)   SpO2 98%   BMI 28.91 kg/m   Body mass index is 28.91 kg/m.  Advanced Directives 01/20/2018 10/03/2017 10/02/2017 09/20/2017 03/19/2017 12/30/2016 12/29/2016  Does Patient Have a Medical Advance Directive? Yes No No No No Yes Yes  Type of Estate agent of [place];Living will - - - - Midwife;Living will -  Does patient want to make changes to medical advance directive? No - Patient declined - - - - No - Patient declined -  Copy of Healthcare Power of Attorney in Chart? Yes - - - - No - copy requested -  Would patient like information on creating a medical advance directive? - No - Patient declined - No - Patient declined - - -    Tobacco Social History   Tobacco Use  Smoking Status Former Smoker  . Packs/day: 2.00  . Years: 40.00  . Pack years: 80.00  . Types: Cigarettes  . Last attempt to quit: 10/17/1992  . Years since quitting: 25.2  Smokeless Tobacco Never Used  Tobacco Comment   quit 1994     Counseling given: Not Answered Comment: quit 1994   Clinical Intake:  Pre-visit preparation completed: Yes        Nutritional Status: BMI 25 -29 Overweight Diabetes: No  How often do you need to have someone help you when you read instructions, pamphlets, or other written materials from your doctor or pharmacy?: 1 - Never  Interpreter Needed?: No     Past Medical History:  Diagnosis Date  . Arthritis    . CAD, multiple vessel 03/2105   LM 65%, mLAD 70%, 90% & 80%, o-pCx 75%, mCx 95% & d Cx 70%, OM1 60%, mRCA 90% with 100% dRCA. R-R collaterals  . Chronic distal aortic occlusion (HCC)    By report  . Chronic kidney disease   . COPD (chronic obstructive pulmonary disease) (HCC)   . Depression   . Enlarged prostate   . Erectile dysfunction   . Essential hypertension   . History of kidney stones    multiple lithotripsies  . Hyperlipidemia LDL goal <70   . Hypogonadism in male    Previously on testosterone injections, but has not yet restarted until further testing  . Muscle pain   . PAF (paroxysmal atrial fibrillation) (HCC) 03/2015   Noted per-cath & post-op CABG.  Dr. [name], cardiology  . Peripheral vascular disease (HCC)    Bilateral ABIs 0.8  . Renal stones 03/2016  . S/P CABG x 5 04/04/2015   LIMA to LAD, SVG to DIAG, SVG to OM, seqSVG -PDA- dCx  . SOB (shortness of breath)   . 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 Weakness    weakness bilateral legs, s/p vein harvesting for CABPG   Past Surgical History:  Procedure Laterality Date  . BLEPHAROPLASTY    .  CARDIAC CATHETERIZATION  1992  . CARDIAC CATHETERIZATION N/A 03/31/2015   Procedure: Left Heart Cath and Coronary Angiography;  Surgeon: Marykay Lex, MD;  Location: [facility] INVASIVE CV LAB: LM 65%, mLAD 70%, 90% & 80%, o-pCx 75%, mCx 95% & d Cx 70%, OM1 60%, mRCA 90% with 100% dRCA. R-R collaterals --> CABG referral. Also noted to be in Afib.  . Carotid Dopplers  12/30/2016   Patent vertebrals. Mild bilateral carotid plaque, <50% stenosis bilaterally.  . CHOLECYSTECTOMY  2000   also had revision of hernia repair  . CORONARY ARTERY BYPASS GRAFT N/A 04/04/2015   Procedure: CORONARY ARTERY BYPASS GRAFTING (CABG);  Surgeon: [name], MD;  Location[facility] OR;  Service: Open Heart Surgery;  Laterality: N/A;  . CYSTECTOMY     face and scrotum  . CYSTOSCOPY WITH INSERTION OF  UROLIFT N/A 11/12/2016   Procedure: CYSTOSCOPY WITH INSERTION OF UROLIFT x4;  Surgeon: Jethro Bolus, MD;  Location: [facility] ORS;  Service: Urology;  Laterality: N/A;  . CYSTOSCOPY WITH RETROGRADE PYELOGRAM, URETEROSCOPY AND STENT PLACEMENT Right 06/26/2016   Procedure: CYSTOSCOPY WITH RIGHT RETROGRADE PYELOGRAM, RIGHT URETEROSCOPY , HOLMIUM LASER, BASKET EXTRACTION AND STENT PLACEMENT;  Surgeon: Jethro Bolus, MD;  Location: [facility];  Service: Urology;  Laterality: Right;  . CYSTOSCOPY WITH RETROGRADE PYELOGRAM, URETEROSCOPY AND STENT PLACEMENT Left 09/23/2017   Procedure: CYSTOSCOPY WITH LEFT RETROGRADE PYELOGRAM, URETEROSCOPY STONE EXTRACTION AND STENT PLACEMENT;  Surgeon: Crista Elliot, MD;  Location: [facility];  Service: Urology;  Laterality: Left;  . Lexiscan Myoview  June 2010   converted to [facility] from treadmill due to inability to reach target heart rate) --the study was normal with no evidence of ischemia and normal LV function. EF 7  . LITHOTRIPSY    . TEE WITHOUT CARDIOVERSION N/A 04/04/2015   Procedure: TRANSESOPHAGEAL ECHOCARDIOGRAM (TEE);  Surgeon: [name], MD;  Location: [facility] OR;  Service: Open Heart Surgery;  Laterality: N/A;  . TRANSTHORACIC ECHOCARDIOGRAM  12/30/2016   EF 60-65%. GR 1 DD. Mildly dilated ascending aorta. Otherwise essentially normal with no valve lesions.  Marland Kitchen VASECTOMY  1975  . VENTRAL HERNIA REPAIR  1994   complicated by infection   Family History  Problem Relation Age of Onset  . Coronary artery disease Father   . Heart disease Father   . Stroke Mother   . Hearing loss Mother   . Stroke Sister   . Hearing loss Maternal Grandmother   . Prostate cancer Neg Hx   . Kidney disease Neg Hx   . Other Neg Hx        hypogonadism   Social History   Socioeconomic History  . Marital status: Married    Spouse name: Not on file  . Number of children: 2  . Years of education: 78  . Highest education level: Not on file  Occupational History  .  Not on file  Social Needs  . Financial resource strain: Not hard at all  . Food insecurity:    Worry: Never true    Inability: Never true  . Transportation needs:    Medical: No    Non-medical: No  Tobacco Use  . Smoking status: Former Smoker    Packs/day: 2.00    Years: 40.00    Pack years: 80.00    Types: Cigarettes    Last attempt to quit: 10/17/1992    Years since quitting: 25.2  . Smokeless tobacco: Never Used  . Tobacco comment: quit 1994  Substance and Sexual  Activity  . Alcohol use: Yes    Alcohol/week: 0.6 oz    Types: 1 Standard drinks or equivalent per week    Comment: seldom - vodka or wine  . Drug use: No  . Sexual activity: Never  Lifestyle  . Physical activity:    Days per week: 5 days    Minutes per session: Not on file  . Stress: Not at all  Relationships  . Social connections:    Talks on phone: Not on file    Gets together: Not on file    Attends religious service: Not on file    Active member of club or organization: Not on file    Attends meetings of clubs or organizations: Not on file    Relationship status: Not on file  Other Topics Concern  . Not on file  Social History Narrative   Lives in [place] with wife. No pets   Two children.      Former smoker. Quit in 1994. Prior to quitting was unable to walk without exertional dyspnea.      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      Hobbies - reading, drives Corvette, chuch    Outpatient Encounter Medications as of 01/20/2018  Medication Sig  . atorvastatin (LIPITOR) 40 MG tablet Take 1 tablet (40 mg total) by mouth daily.  . clopidogrel (PLAVIX) 75 MG tablet TAKE 1 TABLET EVERY DAY  . finasteride (PROSCAR) 5 MG tablet Take 1 tablet (5 mg total) by mouth daily.  . metoprolol tartrate (LOPRESSOR) 25 MG tablet TAKE 1/2 TABLET TWICE DAILY  . NITROSTAT 0.4 MG SL tablet PLACE 1 TABLET (0.4 MG TOTAL) UNDER THE TONGUE EVERY 5  (FIVE) MINUTES AS NEEDED FOR CHEST PAIN.  . tamsulosin (FLOMAX) 0.4 MG CAPS capsule Take 0.4 mg by mouth daily.    No facility-administered encounter medications on file as of 01/20/2018.     Activities of Daily Living In your present state of health, do you have any difficulty performing the following activities: 01/20/2018 10/03/2017  Hearing? N N  Vision? N N  Difficulty concentrating or making decisions? Y N  Comment Notes he has difficulty remembering -  Walking or climbing stairs? N N  Dressing or bathing? N N  Doing errands, shopping? N N  Preparing Food and eating ? N -  Using the Toilet? N -  In the past six months, have you accidently leaked urine? Y -  Comment Followed by Urologist and pcp -  Do you have problems with loss of bowel control? Y -  Comment Followed by pcp -  Managing your Medications? N -  Managing your Finances? N -  Housekeeping or managing your Housekeeping? N -  Some recent data might be hidden    Patient Care Team: Glori Luis, MD as PCP - General (Family Medicine) Marykay Lex, MD as PCP - Cardiology (Cardiology)   Assessment:   This is a routine wellness examination for [name].  The goal of the wellness visit is to assist the patient how to close the gaps in care and create a preventative care plan for the patient.   The roster of all physicians providing medical care to patient is listed in the Snapshot section of the chart.  Osteoporosis risk reviewed.    Safety issues reviewed; Smoke and carbon monoxide detectors in the home. No firearms in the home. Wears seatbelts when driving or  riding with others. No violence in the home.  They do not have excessive sun exposure.  Discussed the need for sun protection: hats, long sleeves and the use of sunscreen if there is significant sun exposure.  Patient is alert, normal appearance, oriented to person/place/and time. Correctly identified the president of the [place] and recalls of 2/3  words.Performs simple calculations and can read correct time from watch face. Displays appropriate judgement.  No failures at ADL's or IADL's.    BMI- discussed the importance of a healthy diet, water intake and the benefits of aerobic exercise. Educational material provided.   24 hour diet recall: Regular diet  Dental- every 6 months.  TDAP vaccine deferred per patient preference.  Follow up with insurance.  Educational material provided.  Patient Concerns: None at this time. Follow up with PCP as needed.  Exercise Activities and Dietary recommendations Current Exercise Habits: Home exercise routine, Type of exercise: walking, Time (Minutes): 20, Frequency (Times/Week): 5, Weekly Exercise (Minutes/Week): 100, Intensity: Mild  Goals    . Brain engagement activity     Word search puzzles Reading Calculation Brain health eating plan, per handout      . Increase physical activity     Walk for exercise       Fall Risk Fall Risk  01/20/2018 01/20/2018 11/19/2016 02/10/2016 01/11/2016  Falls in the past year? No No No No No   Depression Screen PHQ 2/9 Scores 01/20/2018 09/28/2017 11/19/2016 02/10/2016  PHQ - 2 Score 0 1 0 0  PHQ- 9 Score - - 0 -  Exception Documentation - - - -    Cognitive Function MMSE - Mini Mental State Exam 01/20/2018 11/17/2015  Orientation to time 5 5  Orientation to Place 5 5  Registration 3 3  Attention/ Calculation 5 5  Recall 2 3  Language- name 2 objects 2 2  Language- repeat 1 1  Language- follow 3 step command 3 3  Language- read & follow direction 1 1  Write a sentence 1 1  Copy design 1 1  Total score 29 30        Immunization History  Administered Date(s) Administered  . Influenza, High Dose Seasonal PF 03/22/2015, 03/05/2016, 03/25/2017  . Influenza-Unspecified 02/15/2014, 03/28/2017  . Pneumococcal Conjugate-13 12/17/2014  . Pneumococcal Polysaccharide-23 11/19/2016   Screening Tests Health Maintenance  Topic Date Due  . FOOT EXAM   06[birth year]1954  . URINE MICROALBUMIN  06/30/1954  . TETANUS/TDAP  07/01/1963  . OPHTHALMOLOGY EXAM  06/26/2017  . HEMOGLOBIN A1C  01/20/2018  . INFLUENZA VACCINE  02/13/2018  . COLONOSCOPY  12/17/2022  . Hepatitis C Screening  Completed  . PNA vac Low Risk Adult  Completed        Plan:    End of life planning; Advance aging; Advanced directives discussed. Copy of current HCPOA/Living Will on file.    I have personally reviewed and noted the following in the patient's chart:   . Medical and social history . Use of alcohol, tobacco or illicit drugs  . Current medications and supplements . Functional ability and status . Nutritional status . Physical activity . Advanced directives . List of other physicians . Hospitalizations, surgeries, and ER visits in previous 12 months . Vitals . Screenings to include cognitive, depression, and falls . Referrals and appointments  In addition, I have reviewed and discussed with patient certain preventive protocols, quality metrics, and best practice recommendations. A written personalized care plan for preventive services as well as general preventive health  recommendations were provided to patient.     [name], Marc Ficek L, LPN  4/5/40987/03/2018

## 2018-01-21 DIAGNOSIS — J309 Allergic rhinitis, unspecified: Secondary | ICD-10-CM | POA: Insufficient documentation

## 2018-01-21 NOTE — Assessment & Plan Note (Signed)
I suspect related to orthostatic hypotension though he notes a few balance issues that have been chronic as well.  He will discuss his prostate medicines with his urologist.  We will refer to neurology to get their input.  Recent CT head with no obvious cause.
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
## 2018-01-21 NOTE — Assessment & Plan Note (Signed)
He will see urology.

## 2018-01-21 NOTE — Assessment & Plan Note (Signed)
He continues to be orthostatic.  I suspect this is to some degree contributing to his dizzy spells though it sounds as though he has some other issues related to this as well.  I feel his Flomax and Proscar are contributing and he will discuss those with his urologist next week.  He may benefit from alternative treatment.

## 2018-01-21 NOTE — Assessment & Plan Note (Signed)
I suspect some of the patient's headache symptoms are related to nasal and sinus congestion.  We will have him trial Claritin to see if that would be beneficial.
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
## 2018-01-22 ENCOUNTER — Ambulatory Visit: Payer: Medicare Other | Admitting: Family Medicine

## 2018-01-22 ENCOUNTER — Ambulatory Visit: Payer: Medicare Other

## 2018-01-27 DIAGNOSIS — Z87442 Personal history of urinary calculi: Secondary | ICD-10-CM | POA: Diagnosis not present

## 2018-01-27 DIAGNOSIS — R351 Nocturia: Secondary | ICD-10-CM | POA: Diagnosis not present

## 2018-01-27 DIAGNOSIS — R350 Frequency of micturition: Secondary | ICD-10-CM | POA: Diagnosis not present

## 2018-01-27 DIAGNOSIS — N401 Enlarged prostate with lower urinary tract symptoms: Secondary | ICD-10-CM | POA: Diagnosis not present

## 2018-01-28 LAB — PSA: PSA: 0.24

## 2018-02-05 DIAGNOSIS — N200 Calculus of kidney: Secondary | ICD-10-CM | POA: Diagnosis not present

## 2018-02-05 DIAGNOSIS — R1084 Generalized abdominal pain: Secondary | ICD-10-CM | POA: Diagnosis not present

## 2018-02-13 DIAGNOSIS — R1084 Generalized abdominal pain: Secondary | ICD-10-CM | POA: Diagnosis not present

## 2018-02-13 DIAGNOSIS — N401 Enlarged prostate with lower urinary tract symptoms: Secondary | ICD-10-CM | POA: Diagnosis not present

## 2018-02-13 DIAGNOSIS — R350 Frequency of micturition: Secondary | ICD-10-CM | POA: Diagnosis not present

## 2018-02-13 DIAGNOSIS — N200 Calculus of kidney: Secondary | ICD-10-CM | POA: Diagnosis not present

## 2018-02-13 DIAGNOSIS — R351 Nocturia: Secondary | ICD-10-CM | POA: Diagnosis not present

## 2018-02-13 HISTORY — PX: OTHER SURGICAL HISTORY: SHX169

## 2018-02-25 DIAGNOSIS — I4892 Unspecified atrial flutter: Secondary | ICD-10-CM | POA: Diagnosis not present

## 2018-02-25 DIAGNOSIS — I4891 Unspecified atrial fibrillation: Secondary | ICD-10-CM | POA: Diagnosis not present

## 2018-02-25 DIAGNOSIS — R42 Dizziness and giddiness: Secondary | ICD-10-CM | POA: Diagnosis not present

## 2018-02-26 DIAGNOSIS — I4892 Unspecified atrial flutter: Secondary | ICD-10-CM | POA: Diagnosis not present

## 2018-02-26 DIAGNOSIS — I4891 Unspecified atrial fibrillation: Secondary | ICD-10-CM | POA: Diagnosis not present

## 2018-03-03 ENCOUNTER — Telehealth: Payer: Self-pay | Admitting: Cardiology

## 2018-03-03 NOTE — Telephone Encounter (Signed)
Patient aware 03/06/18 is earliest appointment available. Patient states he will be there at the appointment.

## 2018-03-03 NOTE — Telephone Encounter (Signed)
Returned call to patient he stated metoprolol was stopped this past Friday at his urologist's office due to dizziness and low B/P.Stated they put on a 48 hour holter monitor.Stated he has appointment with Dr.Harding 03/06/18, he would like to see him sooner if possible.Advised I will send message to Dr.Harding's RN.

## 2018-03-03 NOTE — Telephone Encounter (Signed)
[facility], it was 116/85 with dizziness in their office last week and his Metoprolol was stopped. Since then he has been running 88/59 and 73/53. He has an appt 03-06-18 but they want to let Dr. Herbie BaltimoreHarding know in case we need to do something before then. Also pt wore HM and turned it in 8-16, not results at this time. Pls advise pt [phone]

## 2018-03-06 ENCOUNTER — Ambulatory Visit (INDEPENDENT_AMBULATORY_CARE_PROVIDER_SITE_OTHER): Payer: Medicare Other | Admitting: Cardiology

## 2018-03-06 ENCOUNTER — Encounter

## 2018-03-06 ENCOUNTER — Encounter: Payer: Self-pay | Admitting: Cardiology

## 2018-03-06 VITALS — BP 117/74 | HR 85 | Ht 69.0 in | Wt 201.0 lb

## 2018-03-06 DIAGNOSIS — I25119 Atherosclerotic heart disease of native coronary artery with unspecified angina pectoris: Secondary | ICD-10-CM

## 2018-03-06 DIAGNOSIS — I739 Peripheral vascular disease, unspecified: Secondary | ICD-10-CM | POA: Diagnosis not present

## 2018-03-06 DIAGNOSIS — R42 Dizziness and giddiness: Secondary | ICD-10-CM | POA: Diagnosis not present

## 2018-03-06 DIAGNOSIS — I4891 Unspecified atrial fibrillation: Secondary | ICD-10-CM | POA: Diagnosis not present

## 2018-03-06 DIAGNOSIS — E785 Hyperlipidemia, unspecified: Secondary | ICD-10-CM | POA: Diagnosis not present

## 2018-03-06 DIAGNOSIS — I2510 Atherosclerotic heart disease of native coronary artery without angina pectoris: Secondary | ICD-10-CM | POA: Diagnosis not present

## 2018-03-06 DIAGNOSIS — I951 Orthostatic hypotension: Secondary | ICD-10-CM

## 2018-03-06 DIAGNOSIS — I480 Paroxysmal atrial fibrillation: Secondary | ICD-10-CM | POA: Diagnosis not present

## 2018-03-06 DIAGNOSIS — I4892 Unspecified atrial flutter: Secondary | ICD-10-CM | POA: Diagnosis not present

## 2018-03-06 MED ORDER — MIDODRINE HCL 2.5 MG PO TABS: 2.5000 mg | ORAL_TABLET | Freq: Two times a day (BID) | ORAL | 6 refills | Status: DC

## 2018-03-06 NOTE — Patient Instructions (Signed)
Medication changes    Continue not to take metoprolol   Start Midodrine 2.5 MG  TWICE A DAY    FOR ONE WEEK STOP TAKING THE PROSCAR -  MONITOR YOUE  URINE FLOW AND BLOOD PRESSURE AND RECORD.  THEN RESTART AND  HOLD  FLOMAX FOR ONE WEEK AND RECORD URINE FLOW AND BLOOD PRESSURE. LET OFFICE KNOW THE RESULTS.   KEEP WHILE HYDRATE, IF SYMPTOMS OCCUR DRINK  A ( PERSONAL)BOTTLE OF GATORADE.    Your physician recommends that you schedule a follow-up appointment in 2 MONTHS WITH DR HARDING.

## 2018-03-06 NOTE — Progress Notes (Signed)
PCP: Glori Luis, MD  Clinic Note: Chief Complaint  Patient presents with  . Follow-up    DIZZY SPELLS  . Coronary Artery Disease    HPI: [name] is a 72 y.o. male with a PMH below who presents today for sooner that planned f/u for low BP & dizzy spells. He has a history of CAD noticed for progressive angina and referred for CABG for multivessel disease. He also has long-standing known occlusion of the distal aorta.  He had atrial fibrillation in the setting of his progressive angina and pre-/peri-CABG timeframe  [facility]-2 min @ a time.  Notes drop in BP during spells followed by feeling lethargic.   - ordered 48 hr monitor to exclude arrhythmia - stopped Metoprolol x 1 week.    Taber Sweetser was last seen on July 02, 2017.  He was doing relatively well but was having some dizzy episodes at that time.  Episodes were thought to gabapentin.  Indicated that his energy level has improved since having started his testosterone.  Recent Hospitalizations: None  Studies Personally Reviewed - (if available, images/films reviewed: From Epic Chart or Care Everywhere)  Carotid Dopplers March 2019: < 50% bilateral  48 hr monitor [facility] - PRELIM DATA REVIEWED - Personally Interpreted) (8/14-15/19) mostly noted sinus rhythm with some bradycardia tachycardia.  Rare PVCs.  3 trigeminy episodes totaling 51 beats.  Frequent PACs: 3 pairs but no runs.  12 bigeminy episodes with 104 total beats.  Otherwise no arrhythmia.  Minimum heart rate 55 bpm & Max 135 bpm --> apparently had at least 2 episodes while wearing the monitor.  Interval History: Wolf returns here today for evaluation of very unusual lightheaded dizzy spells where he also has an out of body experience.  Symptoms began several months ago, getting worse over last ~2 weeks. Sometimes he just feels lightheaded, sometimes is more than just lightheaded where  he feels hot and flushed.  He feels off balance and dizzy.  Episodes were can last anywhere from 30 seconds up to 2 to 3 minutes.  He has not actually had true syncope but has had near syncope.  Quite often when these episodes are occurring he is noted to be hypotensive with pressures lower than 100, occasionally heart rates have been up with 100-120 bpm range.  Nothing overly slow or nothing overly fast.  Lowest blood pressure recorded was 72/52.   These episodes usually do not occur when he is just sitting around not doing anything.  He usually occurs when he is up and about.  (One episode occurred while walking his dog).  He describes a sensation of being the "hot rush coming up the back of his neck to his head like a rush" he then feels lightheaded and occasionally nauseated with some blurry vision.  He even has some point having some difficulty with speech.  He says that he does not really notice any change in vision or overall weakness/numbness.  He does feel dizzy also vertigo type symptoms in some occasions, not all the time.    He does chest tightness or pressure with rest or exertion. No PND orthopnea or edema.  No TIA/amaurosis fugax symptoms. No melena, hematochezia, hematuria, or epstaxis. + stable claudication.  ROS: A comprehensive was performed.  Pertinent symptoms noted in HPI Review of Systems  Constitutional: Positive for malaise/fatigue. Negative for weight loss (wgt gain).  HENT: Negative for nosebleeds.   Cardiovascular: Positive for claudication.  Gastrointestinal: Negative for blood in stool and melena.  Genitourinary: Negative for dysuria and hematuria.  Musculoskeletal: Positive for joint pain.  Neurological: Positive for dizziness and weakness (during these spells).  Psychiatric/Behavioral: Negative for depression. The patient is nervous/anxious (becuase of these Sx.).   All other systems reviewed and are negative.    I have reviewed and (if needed) personally  updated the patient's problem list, medications, allergies, past medical and surgical history, social and family history.   Past Medical History:  Diagnosis Date  . Arthritis   . CAD, multiple vessel 03/2105   LM 65%, mLAD 70%, 90% & 80%, o-pCx 75%, mCx 95% & d Cx 70%, OM1 60%, mRCA 90% with 100% dRCA. R-R collaterals  . Chronic distal aortic occlusion (HCC)    By report  . Chronic kidney disease   . COPD (chronic obstructive pulmonary disease) (HCC)   . Depression   . Enlarged prostate   . Erectile dysfunction   . Essential hypertension   . History of kidney stones    multiple lithotripsies  . Hyperlipidemia LDL goal <70   . Hypogonadism in male    Previously on testosterone injections, but has not yet restarted until further testing  . Muscle pain   . PAF (paroxysmal atrial fibrillation) (HCC) 03/2015   Noted per-cath & post-op CABG.  Dr. [name], cardiology  . Peripheral vascular disease (HCC)    Bilateral ABIs 0.8  . Renal stones 03/2016  . S/P CABG x 5 04/04/2015   LIMA to LAD, SVG to DIAG, SVG to OM, seqSVG -PDA- dCx  . SOB (shortness of breath)   . 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 Weakness    weakness bilateral legs, s/p vein harvesting for CABPG    Past Surgical History:  Procedure Laterality Date  . BLEPHAROPLASTY    . CARDIAC CATHETERIZATION  1992  . CARDIAC CATHETERIZATION N/A 03/31/2015   Procedure: Left Heart Cath and Coronary Angiography;  Surgeon: Marykay Lex, MD;  Location: [facility] INVASIVE CV LAB: LM 65%, mLAD 70%, 90% & 80%, o-pCx 75%, mCx 95% & d Cx 70%, OM1 60%, mRCA 90% with 100% dRCA. R-R collaterals --> CABG referral. Also noted to be in Afib.  . Carotid Dopplers  12/30/2016   Patent vertebrals. Mild bilateral carotid plaque, <50% stenosis bilaterally.  . CHOLECYSTECTOMY  2000   also had revision of hernia repair  . CORONARY ARTERY BYPASS GRAFT N/A 04/04/2015   Procedure: CORONARY ARTERY BYPASS GRAFTING  (CABG);  Surgeon: [name], MD;  [facility] OR;  Service: Open Heart Surgery;  Laterality: N/A;  . CYSTECTOMY     face and scrotum  . CYSTOSCOPY WITH INSERTION OF UROLIFT N/A 11/12/2016   Procedure: CYSTOSCOPY WITH INSERTION OF UROLIFT x4;  Surgeon: Jethro Bolus, MD;  Location: [facility] ORS;  Service: Urology;  Laterality: N/A;  . CYSTOSCOPY WITH RETROGRADE PYELOGRAM, URETEROSCOPY AND STENT PLACEMENT Right 06/26/2016   Procedure: CYSTOSCOPY WITH RIGHT RETROGRADE PYELOGRAM, RIGHT URETEROSCOPY , HOLMIUM LASER, BASKET EXTRACTION AND STENT PLACEMENT;  Surgeon: Jethro Bolus, MD;  Location: [facility] ORS;  Service: Urology;  Laterality: Right;  . CYSTOSCOPY WITH RETROGRADE PYELOGRAM, URETEROSCOPY AND STENT PLACEMENT Left 09/23/2017   Procedure: CYSTOSCOPY WITH LEFT RETROGRADE PYELOGRAM, URETEROSCOPY STONE EXTRACTION AND STENT PLACEMENT;  Surgeon: Crista Elliot, MD;  Location: [facility] ORS;  Service: Urology;  Laterality: Left;  . Lexiscan Myoview  June 2010   converted to Bentley from treadmill due to inability  to reach target heart rate) --the study was normal with no evidence of ischemia and normal LV function. EF 7  . LITHOTRIPSY    . TEE WITHOUT CARDIOVERSION N/A 04/04/2015   Procedure: TRANSESOPHAGEAL ECHOCARDIOGRAM (TEE);  Surgeon: Delight OvensEdward B Gerhardt, MD;  [facility];  Service: Open Heart Surgery;  Laterality: N/A;  . TRANSTHORACIC ECHOCARDIOGRAM  12/30/2016   EF 60-65%. GR 1 DD. Mildly dilated ascending aorta. Otherwise essentially normal with no valve lesions.  Marland Kitchen. VASECTOMY  1975  . VENTRAL HERNIA REPAIR  1994   complicated by infection    Current Meds  Medication Sig  . atorvastatin (LIPITOR) 40 MG tablet Take 1 tablet (40 mg total) by mouth daily.  . clopidogrel (PLAVIX) 75 MG tablet TAKE 1 TABLET EVERY DAY  . finasteride (PROSCAR) 5 MG tablet Take 1 tablet (5 mg total) by mouth daily.  Marland Kitchen. NITROSTAT 0.4 MG SL tablet PLACE 1 TABLET (0.4 MG TOTAL) UNDER THE TONGUE EVERY 5 (FIVE)  MINUTES AS NEEDED FOR CHEST PAIN.  . tamsulosin (FLOMAX) 0.4 MG CAPS capsule Take 0.4 mg by mouth daily.   . [DISCONTINUED] metoprolol tartrate (LOPRESSOR) 25 MG tablet TAKE 1/2 TABLET TWICE DAILY    No Known Allergies  Social History   Tobacco Use  . Smoking status: Former Smoker    Packs/day: 2.00    Years: 40.00    Pack years: 80.00    Types: Cigarettes    Last attempt to quit: 10/17/1992    Years since quitting: 25.4  . Smokeless tobacco: Never Used  . Tobacco comment: quit 1994  Substance Use Topics  . Alcohol use: Yes    Alcohol/week: 1.0 standard drinks    Types: 1 Standard drinks or equivalent per week    Comment: seldom - vodka or wine  . Drug use: No   Social History   Social History Narrative   [place] in [place]. No pets   Two children.      Former smoker. Quit in 1994. Prior to quitting was unable to walk without exertional dyspnea.      Work - Retired. 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      Hobbies - reading, drives Corvette, chuch    family history includes Coronary artery disease in his father; Hearing loss in his maternal grandmother and mother; Heart disease in his father; Stroke in his mother and sister.  Wt Readings from Last 3 Encounters:  03/06/18 201 lb (91.2 kg)  01/20/18 195 lb 12.8 oz (88.8 kg)  01/20/18 195 lb 12.8 oz (88.8 kg)    PHYSICAL EXAM BP 117/74   Pulse 85   Ht 5\' 9"  (1.753 m)   Wt 201 lb (91.2 kg)   BMI 29.68 kg/m  Physical Exam  Constitutional: He is oriented to person, place, and time. He appears well-developed and well-nourished. No distress.  He appears older than his stated age. Appears to be declining in functional status.   Neck: No hepatojugular reflux and no JVD present. Carotid bruit is not present.  Cardiovascular: Normal rate, regular rhythm and normal heart sounds.  Occasional extrasystoles are present. PMI is not displaced. Exam reveals  decreased pulses (BLE - Pedal &  DP pulses). Exam reveals no gallop and no friction rub.  No murmur heard. Pulmonary/Chest: Effort normal and breath sounds normal. No respiratory distress. He has no wheezes. He has no rales.  Abdominal: Soft. Bowel sounds are normal. He exhibits no  distension. There is no tenderness. There is no rebound.  Truncal obesity.   No HSM  Musculoskeletal: Normal range of motion. He exhibits no edema (trivial).  Neurological: He is alert and oriented to person, place, and time.  Psychiatric: He has a normal mood and affect. His behavior is normal. Judgment and thought content normal.  Vitals reviewed.    Adult ECG Report n/a  Other studies Reviewed: Additional studies/ records that were reviewed today include:  Recent Labs:  See below  ASSESSMENT / PLAN: Problem List Items Addressed This Visit    Coronary artery disease involving native coronary artery without angina pectoris (Chronic)    Has done well with no further anginal symptoms since his CABG.   He is coming up on being due for stress test evaluation.  Depending on how he is doing with his dizzy episodes and upcoming visits, I think we can check a stress test just to make sure that this is not ischemic. He is on stable dose of atorvastatin, but I am stopping beta-blocker.  We may even need to consider stopping or reducing atorvastatin if there are issues with memory.  On Plavix for both CAD and PAD.      Relevant Medications   midodrine (PROAMATINE) 2.5 MG tablet   Dizziness of unknown cause - Primary (Chronic)    I do think that a lot of his episodes are related to orthostatic hypotension, I think we need to treat as such.  I would have him stop his beta-blocker altogether.  Temporarily hold by neurology. Start Midodrin 2.5 mg twice daily  --Clinically does not appear to be anything related to any arrhythmia.      Relevant Orders   EKG 12-Lead (Completed)   Dyslipidemia, goal LDL below 70  (Chronic)    On statin. Most recent labs show total cholesterol 160, TG 208, LDL 78, HDL 40.  In the edition trying to avoid confusing symptoms with medication changes on hold off adjusting his statin dose until the dizzy spells are resolved.      Relevant Medications   midodrine (PROAMATINE) 2.5 MG tablet   Orthostatic hypotension    Not only is orthostatic, he has just flat out hypotension episodes.  Consider some type of neurohormonal component.  However he does not have any underlying neurologic deficits that I could recall.  Plan: Maintain adequate hydration including keeping a personal bottle of water and possibly using Gatorade. Start Midrin 2.5 mg twice daily. DC beta-blocker.      Relevant Medications   midodrine (PROAMATINE) 2.5 MG tablet   Other Relevant Orders   EKG 12-Lead (Completed)   PAD (peripheral artery disease) (HCC) (Chronic)    Being followed by vascular surgeon.  History of Leriche syndrome.  He does have claudication, but I do not think he is interested in aortobifem bypass      Relevant Medications   midodrine (PROAMATINE) 2.5 MG tablet   PAF (paroxysmal atrial fibrillation) (HCC) (Chronic)    He did have some A. fib at the time of his initial diagnosis of CAD and prior to CABG, dysphoric until has not had any breakthrough since CABG. Is on Plavix just in case, but has not had any documented recurrence of A. fib, notably not on 48-hour monitor.  In addition, this would not be the rhythm that would cause have near syncope.      Relevant Medications   midodrine (PROAMATINE) 2.5 MG tablet   Other Relevant Orders   EKG 12-Lead (Completed)  I spent a total of 45 minutes with the patient and chart review. >  50% of the time was spent in direct patient consultation.   Current medicines are reviewed at length with the patient today.  (+/- concerns) not sure what to do The following changes have been made:  see below.  Patient Instructions    Medication changes    Continue not to take metoprolol   Start Midodrine 2.5 MG  TWICE A DAY    FOR ONE WEEK STOP TAKING THE PROSCAR -  MONITOR YOUE  URINE FLOW AND BLOOD PRESSURE AND RECORD.  THEN RESTART AND  HOLD  FLOMAX FOR ONE WEEK AND RECORD URINE FLOW AND BLOOD PRESSURE. LET OFFICE KNOW THE RESULTS.   KEEP WHILE HYDRATE, IF SYMPTOMS OCCUR DRINK  A ( PERSONAL)BOTTLE OF GATORADE.    Your physician recommends that you schedule a follow-up appointment in 2 MONTHS WITH DR HARDING.     Studies Ordered:   Orders Placed This Encounter  Procedures  . EKG 12-Lead      Bryan Lemma, M.D., M.S. Interventional Cardiologist   Pager # [phone] Phone # [phone] [address] [place], [place] [address]   Thank you for choosing [facility] at [facility]!!

## 2018-03-12 ENCOUNTER — Encounter: Payer: Self-pay | Admitting: Cardiology

## 2018-03-12 NOTE — Assessment & Plan Note (Signed)
Being followed by vascular surgeon.  History of Leriche syndrome.  He does have claudication, but I do not think he is interested in aortobifem bypass

## 2018-03-12 NOTE — Assessment & Plan Note (Addendum)
He did have some A. fib at the time of his initial diagnosis of CAD and prior to CABG, dysphoric until has not had any breakthrough since CABG. Is on Plavix just in case, but has not had any documented recurrence of A. fib, notably not on 48-hour monitor.  In addition, this would not be the rhythm that would cause have near syncope.

## 2018-03-12 NOTE — Assessment & Plan Note (Signed)
Has done well with no further anginal symptoms since his CABG.   He is coming up on being due for stress test evaluation.  Depending on how he is doing with his dizzy episodes and upcoming visits, I think we can check a stress test just to make sure that this is not ischemic. He is on stable dose of atorvastatin, but I am stopping beta-blocker.  We may even need to consider stopping or reducing atorvastatin if there are issues with memory.  On Plavix for both CAD and PAD.

## 2018-03-12 NOTE — Assessment & Plan Note (Signed)
Not only is orthostatic, he has just flat out hypotension episodes.  Consider some type of neurohormonal component.  However he does not have any underlying neurologic deficits that I could recall.  Plan: Maintain adequate hydration including keeping a personal bottle of water and possibly using Gatorade. Start Midrin 2.5 mg twice daily. DC beta-blocker.

## 2018-03-12 NOTE — Assessment & Plan Note (Signed)
I do think that a lot of his episodes are related to orthostatic hypotension, I think we need to treat as such.  I would have him stop his beta-blocker altogether.  Temporarily hold by neurology. Start Midodrin 2.5 mg twice daily  --Clinically does not appear to be anything related to any arrhythmia.

## 2018-03-12 NOTE — Assessment & Plan Note (Signed)
On statin. Most recent labs show total cholesterol 160, TG 208, LDL 78, HDL 40.  In the edition trying to avoid confusing symptoms with medication changes on hold off adjusting his statin dose until the dizzy spells are resolved.

## 2018-03-20 ENCOUNTER — Telehealth: Payer: Self-pay | Admitting: Cardiology

## 2018-03-20 MED ORDER — MIDODRINE HCL 2.5 MG PO TABS: 2.5000 mg | ORAL_TABLET | Freq: Two times a day (BID) | ORAL | 3 refills | Status: DC

## 2018-03-20 NOTE — Telephone Encounter (Signed)
Patient aware refill sent to the pharmacy electronically.

## 2018-03-20 NOTE — Telephone Encounter (Signed)
New Message   Pt c/o medication issue:  1. Name of Medication: midodrine (PROAMATINE) 2.5 MG tablet  2. How are you currently taking this medication (dosage and times per day)? Take 1 tablet (2.5 mg total) by mouth 2 (two) times daily with a meal  3. Are you having a reaction (difficulty breathing--STAT)? no  4. What is your medication issue? Pt wants to know if Dr. [name] still wants him to take this medication and if so he needs a prescription [facility] - [place], [place] - [address]

## 2018-03-21 ENCOUNTER — Telehealth: Payer: Self-pay | Admitting: *Deleted

## 2018-03-21 ENCOUNTER — Other Ambulatory Visit: Payer: Self-pay | Admitting: *Deleted

## 2018-03-21 ENCOUNTER — Encounter (INDEPENDENT_AMBULATORY_CARE_PROVIDER_SITE_OTHER): Payer: Self-pay | Admitting: Vascular Surgery

## 2018-03-21 ENCOUNTER — Ambulatory Visit (INDEPENDENT_AMBULATORY_CARE_PROVIDER_SITE_OTHER): Payer: Medicare Other

## 2018-03-21 ENCOUNTER — Ambulatory Visit (INDEPENDENT_AMBULATORY_CARE_PROVIDER_SITE_OTHER): Payer: Medicare Other | Admitting: Vascular Surgery

## 2018-03-21 VITALS — BP 115/74 | HR 81 | Resp 13 | Ht 69.0 in | Wt 199.0 lb

## 2018-03-21 DIAGNOSIS — I739 Peripheral vascular disease, unspecified: Secondary | ICD-10-CM | POA: Diagnosis not present

## 2018-03-21 DIAGNOSIS — E785 Hyperlipidemia, unspecified: Secondary | ICD-10-CM

## 2018-03-21 DIAGNOSIS — I10 Essential (primary) hypertension: Secondary | ICD-10-CM | POA: Diagnosis not present

## 2018-03-21 DIAGNOSIS — I70219 Atherosclerosis of native arteries of extremities with intermittent claudication, unspecified extremity: Secondary | ICD-10-CM | POA: Insufficient documentation

## 2018-03-21 DIAGNOSIS — I70213 Atherosclerosis of native arteries of extremities with intermittent claudication, bilateral legs: Secondary | ICD-10-CM

## 2018-03-21 DIAGNOSIS — Z951 Presence of aortocoronary bypass graft: Secondary | ICD-10-CM | POA: Diagnosis not present

## 2018-03-21 NOTE — Telephone Encounter (Signed)
-----   Message from Tobin Chad, RN sent at 12/25/2017  3:09 PM EDT ----- Mail LETTER AND LABSLIP @ 03/27/18 lipid , CMP,LIPID  Due 0ct [phone]

## 2018-03-21 NOTE — Patient Instructions (Signed)

## 2018-03-21 NOTE — Telephone Encounter (Signed)
MAILED LAB SLIP AND LETTER 

## 2018-03-21 NOTE — Progress Notes (Signed)
MRN : [id number]  [name] is a 72 y.o. (12-26-1945) male who presents with chief complaint of  Chief Complaint  Patient presents with  . Follow-up    1 year ABI u/s follow up  .  History of Present Illness: Patient returns today in follow up of PAD.  He does describe some claudication symptoms of cramping in his calves and thighs with activity.  This is generally relieved with rest.  This is not significantly changed from his last study earlier this year.  He does not have ulceration or infection.  His ABIs today are 0.87 on the right and 0.94 on the left with biphasic waveforms.  His digital pressures are reduced at 58 on the right and 65 on the left but the waveforms are pretty good.  This is better than his last ABIs about 3 years ago.  Current Outpatient Medications  Medication Sig Dispense Refill  . atorvastatin (LIPITOR) 40 MG tablet Take 1 tablet (40 mg total) by mouth daily. 90 tablet 3  . clopidogrel (PLAVIX) 75 MG tablet TAKE 1 TABLET EVERY DAY 90 tablet 3  . finasteride (PROSCAR) 5 MG tablet Take 1 tablet (5 mg total) by mouth daily. 90 tablet 1  . midodrine (PROAMATINE) 2.5 MG tablet Take 1 tablet (2.5 mg total) by mouth 2 (two) times daily with a meal. 180 tablet 3  . NITROSTAT 0.4 MG SL tablet PLACE 1 TABLET (0.4 MG TOTAL) UNDER THE TONGUE EVERY 5 (FIVE) MINUTES AS NEEDED FOR CHEST PAIN. 25 tablet 5  . tamsulosin (FLOMAX) 0.4 MG CAPS capsule Take 0.4 mg by mouth daily.      No current facility-administered medications for this visit.     Past Medical History:  Diagnosis Date  . Arthritis   . CAD, multiple vessel 03/2105   LM 65%, mLAD 70%, 90% & 80%, o-pCx 75%, mCx 95% & d Cx 70%, OM1 60%, mRCA 90% with 100% dRCA. R-R collaterals  . Chronic distal aortic occlusion (HCC)    By report  . Chronic kidney disease   . COPD (chronic obstructive pulmonary disease) (HCC)   . Depression   . Enlarged prostate   . Erectile dysfunction   . Essential hypertension   .  History of kidney stones    multiple lithotripsies  . Hyperlipidemia LDL goal <70   . Hypogonadism in male    Previously on testosterone injections, but has not yet restarted until further testing  . Muscle pain   . PAF (paroxysmal atrial fibrillation) (HCC) 03/2015   Noted per-cath & post-op CABG.  Dr. [name], cardiology  . Peripheral vascular disease (HCC)    Bilateral ABIs 0.8  . Renal stones 03/2016  . S/P CABG x 5 04/04/2015   LIMA to LAD, SVG to DIAG, SVG to OM, seqSVG -PDA- dCx  . SOB (shortness of breath)   . 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 Weakness    weakness bilateral legs, s/p vein harvesting for CABPG    Past Surgical History:  Procedure Laterality Date  . BLEPHAROPLASTY    . CARDIAC CATHETERIZATION  1992  . CARDIAC CATHETERIZATION N/A 03/31/2015   Procedure: Left Heart Cath and Coronary Angiography;  Surgeon: Marykay Lex, MD;  Location: [facility] INVASIVE CV LAB: LM 65%, mLAD 70%, 90% & 80%, o-pCx 75%, mCx 95% & d Cx 70%, OM1 60%, mRCA 90% with 100% dRCA. R-R collaterals --> CABG referral. Also noted to be in Afib.  Marland Kitchen  Carotid Dopplers  12/30/2016   Patent vertebrals. Mild bilateral carotid plaque, <50% stenosis bilaterally.  . CHOLECYSTECTOMY  2000   also had revision of hernia repair  . CORONARY ARTERY BYPASS GRAFT N/A 04/04/2015   Procedure: CORONARY ARTERY BYPASS GRAFTING (CABG);  Surgeon: [name], MD;  Location: [facility] OR;  Service: Open Heart Surgery;  Laterality: N/A;  . CYSTECTOMY     face and scrotum  . CYSTOSCOPY WITH INSERTION OF UROLIFT N/A 11/12/2016   Procedure: CYSTOSCOPY WITH INSERTION OF UROLIFT x4;  Surgeon: Jethro Bolus, MD;  Location: [facility] ORS;  Service: Urology;  Laterality: N/A;  . CYSTOSCOPY WITH RETROGRADE PYELOGRAM, URETEROSCOPY AND STENT PLACEMENT Right 06/26/2016   Procedure: CYSTOSCOPY WITH RIGHT RETROGRADE PYELOGRAM, RIGHT URETEROSCOPY , HOLMIUM LASER, BASKET EXTRACTION AND STENT  PLACEMENT;  Surgeon: Jethro Bolus, MD;  Location: [facility];  Service: Urology;  Laterality: Right;  . CYSTOSCOPY WITH RETROGRADE PYELOGRAM, URETEROSCOPY AND STENT PLACEMENT Left 09/23/2017   Procedure: CYSTOSCOPY WITH LEFT RETROGRADE PYELOGRAM, URETEROSCOPY STONE EXTRACTION AND STENT PLACEMENT;  Surgeon: Crista Elliot, MD;  Location: [facility];  Service: Urology;  Laterality: Left;  . Lexiscan Myoview  June 2010   converted to [facility] from treadmill due to inability to reach target heart rate) --the study was normal with no evidence of ischemia and normal LV function. EF 7  . LITHOTRIPSY    . TEE WITHOUT CARDIOVERSION N/A 04/04/2015   Procedure: TRANSESOPHAGEAL ECHOCARDIOGRAM (TEE);  Surgeon: [name], MD;  Location: [facility] OR;  Service: Open Heart Surgery;  Laterality: N/A;  . TRANSTHORACIC ECHOCARDIOGRAM  12/30/2016   EF 60-65%. GR 1 DD. Mildly dilated ascending aorta. Otherwise essentially normal with no valve lesions.  Marland Kitchen VASECTOMY  1975  . VENTRAL HERNIA REPAIR  1994   complicated by infection    Social History Social History   Tobacco Use  . Smoking status: Former Smoker    Packs/day: 2.00    Years: 40.00    Pack years: 80.00    Types: Cigarettes    Last attempt to quit: 10/17/1992    Years since quitting: 25.4  . Smokeless tobacco: Never Used  . Tobacco comment: quit 1994  Substance Use Topics  . Alcohol use: Yes    Alcohol/week: 1.0 standard drinks    Types: 1 Standard drinks or equivalent per week    Comment: seldom - vodka or wine  . Drug use: No     Family History Family History  Problem Relation Age of Onset  . Coronary artery disease Father   . Heart disease Father   . Stroke Mother   . Hearing loss Mother   . Stroke Sister   . Hearing loss Maternal Grandmother   . Prostate cancer Neg Hx   . Kidney disease Neg Hx   . Other Neg Hx        hypogonadism     No Known Allergies   REVIEW OF SYSTEMS (Negative unless checked)  Constitutional:  [] Weight loss  [] Fever  [] Chills Cardiac: [] Chest pain   [] Chest pressure   [x] Palpitations   [] Shortness of breath when laying flat   [] Shortness of breath at rest   [x] Shortness of breath with exertion. Vascular:  [x] Pain in legs with walking   [] Pain in legs at rest   [] Pain in legs when laying flat   [x] Claudication   [] Pain in feet when walking  [] Pain in feet at rest  [] Pain in feet when laying flat   [] History of DVT   [] Phlebitis   []   Swelling in legs   [] Varicose veins   [] Non-healing ulcers Pulmonary:   [] Uses home oxygen   [] Productive cough   [] Hemoptysis   [] Wheeze  [x] COPD   [] Asthma Neurologic:  [] Dizziness  [] Blackouts   [] Seizures   [] History of stroke   [] History of TIA  [] Aphasia   [] Temporary blindness   [] Dysphagia   [] Weakness or numbness in arms   [] Weakness or numbness in legs Musculoskeletal:  [x] Arthritis   [] Joint swelling   [] Joint pain   [] Low back pain Hematologic:  [] Easy bruising  [] Easy bleeding   [] Hypercoagulable state   [] Anemic  [] Hepatitis Gastrointestinal:  [] Blood in stool   [] Vomiting blood  [] Gastroesophageal reflux/heartburn   [] Abdominal pain Genitourinary:  [] Chronic kidney disease   [] Difficult urination  [] Frequent urination  [] Burning with urination   [] Hematuria Skin:  [] Rashes   [] Ulcers   [] Wounds Psychological:  [] History of anxiety   []  History of major depression.  Physical Examination  BP 115/74 (BP Location: Right Arm, Patient Position: Sitting)   Pulse 81   Resp 13   Ht 5\' 9"  (1.753 m)   Wt 199 lb (90.3 kg)   BMI 29.39 kg/m  Gen:  WD/WN, NAD Head: [place]/AT, No temporalis wasting. Ear/Nose/Throat: Hearing grossly intact, nares w/o erythema or drainage Eyes: Conjunctiva clear. Sclera non-icteric Neck: Supple.  Trachea midline Pulmonary:  Good air movement, no use of accessory muscles.  Cardiac: RRR, no JVD Vascular:  Vessel Right Left  Radial Palpable Palpable                          PT  1+ palpable  1+ palpable  DP Palpable  Palpable    Musculoskeletal: M/S 5/5 throughout.  No deformity or atrophy.  Trace lower extremity edema.  Gatter varicosities bilaterally Neurologic: Sensation grossly intact in extremities.  Symmetrical.  Speech is fluent.  Psychiatric: Judgment intact, Mood & affect appropriate for pt's clinical situation. Dermatologic: No rashes or ulcers noted.  No cellulitis or open wounds.       Labs Recent Results (from the past 2160 hour(s))  PSA     Status: None   Collection Time: 01/27/18 12:00 AM  Result Value Ref Range   PSA 0.24     Radiology No results found.  Assessment/Plan S/P CABG x 5 No current angina  Dyslipidemia, goal LDL below 70 lipid control important in reducing the progression of atherosclerotic disease. Continue statin therapy  Essential hypertension blood pressure control important in reducing the progression of atherosclerotic disease. On appropriate oral medications.  Atherosclerosis of native arteries of extremity with intermittent claudication (HCC) His ABIs today are 0.87 on the right and 0.94 on the left with biphasic waveforms.  His digital pressures are reduced at 58 on the right and 65 on the left but the waveforms are pretty good.  This is better than his last ABIs about 3 years ago. Symptomatically, his claudication symptoms are stable and he does not have limb threatening symptoms.  This has been a known problem for over 20 years.  No intervention planned at this time.  Return in 1 years with ABIs.  Continue current medical regimen    Festus Barren, MD  03/21/2018 3:10 PM    This note was created with Dragon medical transcription system.  Any errors from dictation are purely unintentional

## 2018-03-21 NOTE — Assessment & Plan Note (Signed)
His ABIs today are 0.87 on the right and 0.94 on the left with biphasic waveforms.  His digital pressures are reduced at 58 on the right and 65 on the left but the waveforms are pretty good.  This is better than his last ABIs about 3 years ago. Symptomatically, his claudication symptoms are stable and he does not have limb threatening symptoms.  This has been a known problem for over 20 years.  No intervention planned at this time.  Return in 1 years with ABIs.  Continue current medical regimen

## 2018-04-02 ENCOUNTER — Ambulatory Visit (INDEPENDENT_AMBULATORY_CARE_PROVIDER_SITE_OTHER): Payer: Medicare Other | Admitting: Family Medicine

## 2018-04-02 ENCOUNTER — Encounter: Payer: Self-pay | Admitting: Family Medicine

## 2018-04-02 ENCOUNTER — Ambulatory Visit (INDEPENDENT_AMBULATORY_CARE_PROVIDER_SITE_OTHER)
Admission: RE | Admit: 2018-04-02 | Discharge: 2018-04-02 | Disposition: A | Discharge status: Home / Self Care | Payer: Medicare Other | Source: Ambulatory Visit | Attending: Family Medicine | Admitting: Family Medicine

## 2018-04-02 VITALS — BP 132/74 | HR 81 | Ht 69.0 in | Wt 200.0 lb

## 2018-04-02 DIAGNOSIS — E038 Other specified hypothyroidism: Secondary | ICD-10-CM | POA: Diagnosis not present

## 2018-04-02 DIAGNOSIS — I70213 Atherosclerosis of native arteries of extremities with intermittent claudication, bilateral legs: Secondary | ICD-10-CM

## 2018-04-02 DIAGNOSIS — M7062 Trochanteric bursitis, left hip: Secondary | ICD-10-CM

## 2018-04-02 DIAGNOSIS — M2550 Pain in unspecified joint: Secondary | ICD-10-CM | POA: Diagnosis not present

## 2018-04-02 DIAGNOSIS — Z23 Encounter for immunization: Secondary | ICD-10-CM | POA: Diagnosis not present

## 2018-04-02 DIAGNOSIS — M5030 Other cervical disc degeneration, unspecified cervical region: Secondary | ICD-10-CM | POA: Diagnosis not present

## 2018-04-02 DIAGNOSIS — M7061 Trochanteric bursitis, right hip: Secondary | ICD-10-CM

## 2018-04-02 DIAGNOSIS — R739 Hyperglycemia, unspecified: Secondary | ICD-10-CM

## 2018-04-02 DIAGNOSIS — M542 Cervicalgia: Secondary | ICD-10-CM

## 2018-04-02 NOTE — Patient Instructions (Signed)
Good to see you  We will give you some exercises  Keep hands within peripheral vision but work on posture Injected both hips We will get labs today tell them about harding labs as well  See me again in 3 weeks and if not a lot better we will need to do injection in your back

## 2018-04-02 NOTE — Progress Notes (Signed)
[name] [address] [place], [place] [address] Phone: [phone] Subjective:   Bruce Donath, am serving as a scribe for Dr. Antoine Primas.   CC: Hip pain  [id number]  [name] is a 72 y.o. male coming in with complaint of bilateral hip pain. Pain in the left is 6/10, right hip is 3/10. Pain with walking especially on the incline. Pain over the greater trochanter.  Patient was given injections 3 months ago.  Has responded to them previously.  Trying to stay active but finds it difficult secondary to the discomfort and pain.  Patient has been having cervical spine pain and bilateral shoulder pain for one week. Pain is intermittent. Notes sleeping on his side and wondering if pain is coming from that position.  States it is more of an aching sensation.  Recently has seen a neurologist.  Neurologist wants him to have an MRI secondary to recent dizziness.  Work-up of the heart and 48-hour monitor was reviewed and did not only show PFTs.  Patient has had difficulty with paroxysmal atrial fibrillation previously.  He is on a blood thinner.  Patient just feels fatigued overall.  Looking for some answers.     Past Medical History:  Diagnosis Date  . Arthritis   . CAD, multiple vessel 03/2105   LM 65%, mLAD 70%, 90% & 80%, o-pCx 75%, mCx 95% & d Cx 70%, OM1 60%, mRCA 90% with 100% dRCA. R-R collaterals  . Chronic distal aortic occlusion (HCC)    By report  . Chronic kidney disease   . COPD (chronic obstructive pulmonary disease) (HCC)   . Depression   . Enlarged prostate   . Erectile dysfunction   . Essential hypertension   . History of kidney stones    multiple lithotripsies  . Hyperlipidemia LDL goal <70   . Hypogonadism in male    Previously on testosterone injections, but has not yet restarted until further testing  . Muscle pain   . PAF (paroxysmal atrial fibrillation) (HCC) 03/2015   Noted per-cath & post-op CABG.  Dr. [name], cardiology   . Peripheral vascular disease (HCC)    Bilateral ABIs 0.8  . Renal stones 03/2016  . S/P CABG x 5 04/04/2015   LIMA to LAD, SVG to DIAG, SVG to OM, seqSVG -PDA- dCx  . SOB (shortness of breath)   . 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 Weakness    weakness bilateral legs, s/p vein harvesting for CABPG   Past Surgical History:  Procedure Laterality Date  . BLEPHAROPLASTY    . CARDIAC CATHETERIZATION  1992  . CARDIAC CATHETERIZATION N/A 03/31/2015   Procedure: Left Heart Cath and Coronary Angiography;  Surgeon: Marykay Lex, MD;  Location: [facility] INVASIVE CV LAB: LM 65%, mLAD 70%, 90% & 80%, o-pCx 75%, mCx 95% & d Cx 70%, OM1 60%, mRCA 90% with 100% dRCA. R-R collaterals --> CABG referral. Also noted to be in Afib.  . Carotid Dopplers  12/30/2016   Patent vertebrals. Mild bilateral carotid plaque, <50% stenosis bilaterally.  . CHOLECYSTECTOMY  2000   also had revision of hernia repair  . CORONARY ARTERY BYPASS GRAFT N/A 04/04/2015   Procedure: CORONARY ARTERY BYPASS GRAFTING (CABG);  Surgeon: [name], MD;  Location: [facility] OR;  Service: Open Heart Surgery;  Laterality: N/A;  . CYSTECTOMY     face and scrotum  . CYSTOSCOPY WITH INSERTION OF UROLIFT N/A 11/12/2016  Procedure: CYSTOSCOPY WITH INSERTION OF UROLIFT x4;  Surgeon: Jethro Bolusannenbaum, Sigmund, MD;  Location: [facility];  Service: Urology;  Laterality: N/A;  . CYSTOSCOPY WITH RETROGRADE PYELOGRAM, URETEROSCOPY AND STENT PLACEMENT Right 06/26/2016   Procedure: CYSTOSCOPY WITH RIGHT RETROGRADE PYELOGRAM, RIGHT URETEROSCOPY , HOLMIUM LASER, BASKET EXTRACTION AND STENT PLACEMENT;  Surgeon: Jethro BolusSigmund Tannenbaum, MD;  Location: [facility];  Service: Urology;  Laterality: Right;  . CYSTOSCOPY WITH RETROGRADE PYELOGRAM, URETEROSCOPY AND STENT PLACEMENT Left 09/23/2017   Procedure: CYSTOSCOPY WITH LEFT RETROGRADE PYELOGRAM, URETEROSCOPY STONE EXTRACTION AND STENT PLACEMENT;  Surgeon: Crista ElliotBell, Eugene D III, MD;   Location: [facility];  Service: Urology;  Laterality: Left;  . Lexiscan Myoview  June 2010   [facility] due to inability to reach target heart rate) --the study was normal with no evidence of ischemia and normal LV function. EF 7  . LITHOTRIPSY    . TEE WITHOUT CARDIOVERSION N/A 04/04/2015   Procedure: TRANSESOPHAGEAL ECHOCARDIOGRAM (TEE);  Surgeon: Delight OvensEdward B Gerhardt, MD;  [facility];  Service: Open Heart Surgery;  Laterality: N/A;  . TRANSTHORACIC ECHOCARDIOGRAM  12/30/2016   EF 60-65%. GR 1 DD. Mildly dilated ascending aorta. Otherwise essentially normal with no valve lesions.  Marland Kitchen. VASECTOMY  1975  . VENTRAL HERNIA REPAIR  1994   complicated by infection   Social History   Socioeconomic History  . Marital status: Married    Spouse name: Not on file  . Number of children: 2  . Years of education: 714  . Highest education level: Not on file  Occupational History  . Not on file  Social Needs  . Financial resource strain: Not hard at all  . Food insecurity:    Worry: Never true    Inability: Never true  . Transportation needs:    Medical: No    Non-medical: No  Tobacco Use  . Smoking status: Former Smoker    Packs/day: 2.00    Years: 40.00    Pack years: 80.00    Types: Cigarettes    Last attempt to quit: 10/17/1992    Years since quitting: 25.4  . Smokeless tobacco: Never Used  . Tobacco comment: quit 1994  Substance and Sexual Activity  . Alcohol use: Yes    Alcohol/week: 1.0 standard drinks    Types: 1 Standard drinks or equivalent per week    Comment: seldom - vodka or wine  . Drug use: No  . Sexual activity: Never  Lifestyle  . Physical activity:    Days per week: 5 days    Minutes per session: Not on file  . Stress: Not at all  Relationships  . Social connections:    Talks on phone: Not on file    Gets together: Not on file    Attends religious service: Not on file    Active member of club or organization: Not on file    Attends  meetings of clubs or organizations: Not on file    Relationship status: Not on file  Other Topics Concern  . Not on file  Social History Narrative   [place]. No pets   Two children.      Former smoker. Quit in 1994. Prior to quitting was unable to walk without exertional dyspnea.      Work - Retired. 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      Hobbies - reading, drives Corvette, chuch   No Known Allergies  Family History  Problem Relation Age of Onset  . Coronary artery disease Father   . Heart disease Father   . Stroke Mother   . Hearing loss Mother   . Stroke Sister   . Hearing loss Maternal Grandmother   . Prostate cancer Neg Hx   . Kidney disease Neg Hx   . Other Neg Hx        hypogonadism     Current Outpatient Medications (Cardiovascular):  .  atorvastatin (LIPITOR) 40 MG tablet, Take 1 tablet (40 mg total) by mouth daily. .  midodrine (PROAMATINE) 2.5 MG tablet, Take 1 tablet (2.5 mg total) by mouth 2 (two) times daily with a meal. .  NITROSTAT 0.4 MG SL tablet, PLACE 1 TABLET (0.4 MG TOTAL) UNDER THE TONGUE EVERY 5 (FIVE) MINUTES AS NEEDED FOR CHEST PAIN.    Current Outpatient Medications (Hematological):  .  clopidogrel (PLAVIX) 75 MG tablet, TAKE 1 TABLET EVERY DAY  Current Outpatient Medications (Other):  .  finasteride (PROSCAR) 5 MG tablet, Take 1 tablet (5 mg total) by mouth daily. .  tamsulosin (FLOMAX) 0.4 MG CAPS capsule, Take 0.4 mg by mouth daily.     Past medical history, social, surgical and family history all reviewed in electronic medical record.  No pertanent information unless stated regarding to the chief complaint.   Review of Systems:  No headache, visual changes, nausea, vomiting, diarrhea, constipation, dizziness, abdominal pain, skin rash, fevers, chills, night sweats, weight loss, swollen lymph nodes, chest pain, shortness of breath, mood changes.  Positive  fatigue, muscle aches, joint swelling and body aches  Objective  Blood pressure 132/74, pulse 81, height 5\' 9"  (1.753 m), weight 200 lb (90.7 kg), SpO2 94 %.    General: No apparent distress alert and oriented x3 mood and affect normal, dressed appropriately.  HEENT: Pupils equal, extraocular movements intact  Respiratory: Patient's speak in full sentences and does not appear short of breath  Cardiovascular: Trace lower extremity edema, non tender, no erythema  Skin: Warm dry intact with no signs of infection or rash on extremities or on axial skeleton.  Abdomen: Soft nontender  Neuro: Cranial nerves II through XII are intact, neurovascularly intact in all extremities with 2+ DTRs and 1+ pulses but symmetric of the lower extremities.  Lymph: No lymphadenopathy of posterior or anterior cervical chain or axillae bilaterally.  Gait antalgic with mild shuffling gait MSK:  tender with limited range of motion and good stability and symmetric strength and tone of shoulders, elbows, wrist, hip, knee and ankles bilaterally.  Significant arthritic changes of multiple joints  Neck exam shows loss of lordosis.  Patient only has 5 degrees of extension.  Mild positive Spurling's on the right arm.  Patient has limited side bending to the right as well.  Patient does have good symmetric grip strength.  Bilateral hip exam shows some decreased range of motion in all planes.  Severe tenderness to palpation though over the lateral aspect of the hips bilaterally.  Negative straight leg test.  Tender over the greater trochanteric bilaterally.  After verbal consent patient was prepped in sterile fashion with alcohol swabs. Ethyl chloride used patient was injected with a 22-gauge 3 inch needle into the RIGHT lateral hip in the greater trochanteric area under ultrasound guidance. Picture was taken. Patient had 4 cc of 0.5% Marcaine and 1 cc of Kenalog 40 mg/dL injected. Patient tolerated the procedure well and no blood  loss. Pain completely resolved after injection stating proper placement. Post injection  instructions given.   After verbal consent patient was prepped in sterile fashion with alcohol swabs. Ethyl chloride used patient was injected with a 22-gauge 3 inch needle into the LEFT lateral hip in the greater trochanteric area under ultrasound guidance. Picture was taken. Patient had 4 cc of 0.5% Marcaine and 1 cc of Kenalog 40 mg/dL injected. Patient tolerated the procedure well and no blood loss. Pain completely resolved after injection stating proper placement. Post injection instructions given.   Impression and Recommendations:     This case required medical decision making of moderate complexity. The above documentation has been reviewed and is accurate and complete Judi Saa, DO       Note: This dictation was prepared with Dragon dictation along with smaller phrase technology. Any transcriptional errors that result from this process are unintentional.

## 2018-04-02 NOTE — Assessment & Plan Note (Signed)
Repeat injection given today.  Do feel that the possible epidural in the back could be beneficial.  Patient does appear to be more ill than what I am seen patient previously.  Laboratory work-up ordered.  Will change management if anything else comes up.  Recent cardiovascular Holter monitor was unremarkable.  Patient is seeing neurology and would like MRI but patient is adamant to get this at the moment.

## 2018-04-02 NOTE — Assessment & Plan Note (Signed)
Well.  Bilateral injections again today.  Patient though likely has some lumbar radiculopathy.  Does have degenerative disc disease seen on CT scan previously.  May need to consider epidural.  We will continue to monitor.  We need to discuss his blood thinner at that time.  Laboratory work-up to further evaluate for some of his chronic pain medicine patient is ill-appearing today.  Patient notes worsening symptoms seek medical attention

## 2018-04-02 NOTE — Assessment & Plan Note (Signed)
X-rays ordered today.  Scapular exercises given, discussed posture and ergonomics.  Wants to avoid significant number of medications.  Following up again in 4 weeks

## 2018-04-03 ENCOUNTER — Encounter: Payer: Self-pay | Admitting: Family Medicine

## 2018-04-03 DIAGNOSIS — M2550 Pain in unspecified joint: Secondary | ICD-10-CM | POA: Diagnosis not present

## 2018-04-03 DIAGNOSIS — E038 Other specified hypothyroidism: Secondary | ICD-10-CM | POA: Diagnosis not present

## 2018-04-03 DIAGNOSIS — R739 Hyperglycemia, unspecified: Secondary | ICD-10-CM | POA: Diagnosis not present

## 2018-04-03 DIAGNOSIS — E785 Hyperlipidemia, unspecified: Secondary | ICD-10-CM | POA: Diagnosis not present

## 2018-04-04 LAB — COMPREHENSIVE METABOLIC PANEL
ALT: 20 IU/L (ref 0–44)
AST: 16 IU/L (ref 0–40)
Albumin/Globulin Ratio: 1.8 (ref 1.2–2.2)
Albumin: 4.2 g/dL (ref 3.5–4.8)
Alkaline Phosphatase: 77 IU/L (ref 39–117)
BUN/Creatinine Ratio: 21 (ref 10–24)
BUN: 22 mg/dL (ref 8–27)
Bilirubin Total: 0.3 mg/dL (ref 0.0–1.2)
CO2: 23 mmol/L (ref 20–29)
Calcium: 9.6 mg/dL (ref 8.6–10.2)
Chloride: 103 mmol/L (ref 96–106)
Creatinine, Ser: 1.07 mg/dL (ref 0.76–1.27)
GFR calc Af Amer: 79 mL/min/{1.73_m2} (ref >59)
GFR calc non Af Amer: 68 mL/min/{1.73_m2} (ref >59)
Globulin, Total: 2.4 g/dL (ref 1.5–4.5)
Glucose: 133 mg/dL — ABNORMAL HIGH (ref 65–99)
Potassium: 4.2 mmol/L (ref 3.5–5.2)
Sodium: 142 mmol/L (ref 134–144)
Total Protein: 6.6 g/dL (ref 6.0–8.5)

## 2018-04-04 LAB — LIPID PANEL
Chol/HDL Ratio: 3.2 ratio (ref 0.0–5.0)
Cholesterol, Total: 155 mg/dL (ref 100–199)
HDL: 48 mg/dL (ref >39)
LDL Calculated: 75 mg/dL (ref 0–99)
Triglycerides: 161 mg/dL — ABNORMAL HIGH (ref 0–149)
VLDL Cholesterol Cal: 32 mg/dL (ref 5–40)

## 2018-04-11 ENCOUNTER — Encounter: Payer: Self-pay | Admitting: Family Medicine

## 2018-04-18 ENCOUNTER — Encounter

## 2018-04-18 ENCOUNTER — Ambulatory Visit (INDEPENDENT_AMBULATORY_CARE_PROVIDER_SITE_OTHER): Payer: Medicare Other | Admitting: Family Medicine

## 2018-04-18 ENCOUNTER — Encounter: Payer: Self-pay | Admitting: Family Medicine

## 2018-04-18 VITALS — BP 130/70 | HR 89 | Temp 98.0°F | Ht 69.0 in | Wt 203.2 lb

## 2018-04-18 DIAGNOSIS — R002 Palpitations: Secondary | ICD-10-CM

## 2018-04-18 DIAGNOSIS — I70213 Atherosclerosis of native arteries of extremities with intermittent claudication, bilateral legs: Secondary | ICD-10-CM | POA: Diagnosis not present

## 2018-04-18 DIAGNOSIS — R5383 Other fatigue: Secondary | ICD-10-CM | POA: Diagnosis not present

## 2018-04-18 DIAGNOSIS — R238 Other skin changes: Secondary | ICD-10-CM | POA: Insufficient documentation

## 2018-04-18 DIAGNOSIS — R5382 Chronic fatigue, unspecified: Secondary | ICD-10-CM | POA: Diagnosis not present

## 2018-04-18 DIAGNOSIS — R7309 Other abnormal glucose: Secondary | ICD-10-CM | POA: Diagnosis not present

## 2018-04-18 DIAGNOSIS — I951 Orthostatic hypotension: Secondary | ICD-10-CM

## 2018-04-18 LAB — COMPREHENSIVE METABOLIC PANEL
ALT: 23 U/L (ref 0–53)
AST: 15 U/L (ref 0–37)
Albumin: 3.9 g/dL (ref 3.5–5.2)
Alkaline Phosphatase: 61 U/L (ref 39–117)
BUN: 30 mg/dL — ABNORMAL HIGH (ref 6–23)
CO2: 29 mEq/L (ref 19–32)
Calcium: 9.7 mg/dL (ref 8.4–10.5)
Chloride: 104 mEq/L (ref 96–112)
Creatinine, Ser: 1.1 mg/dL (ref 0.40–1.50)
GFR: 69.59 mL/min (ref >60.00)
Glucose, Bld: 116 mg/dL — ABNORMAL HIGH (ref 70–99)
Potassium: 4.1 mEq/L (ref 3.5–5.1)
Sodium: 139 mEq/L (ref 135–145)
Total Bilirubin: 0.5 mg/dL (ref 0.2–1.2)
Total Protein: 6.9 g/dL (ref 6.0–8.3)

## 2018-04-18 LAB — CBC WITH DIFFERENTIAL/PLATELET
Basophils Absolute: 0.1 10*3/uL (ref 0.0–0.1)
Basophils Relative: 0.5 % (ref 0.0–3.0)
Eosinophils Absolute: 0.1 10*3/uL (ref 0.0–0.7)
Eosinophils Relative: 1 % (ref 0.0–5.0)
HCT: 42.5 % (ref 39.0–52.0)
Hemoglobin: 14.5 g/dL (ref 13.0–17.0)
Lymphocytes Relative: 19.8 % (ref 12.0–46.0)
Lymphs Abs: 2.2 10*3/uL (ref 0.7–4.0)
MCHC: 34.1 g/dL (ref 30.0–36.0)
MCV: 98.7 fl (ref 78.0–100.0)
Monocytes Absolute: 1 10*3/uL (ref 0.1–1.0)
Monocytes Relative: 8.9 % (ref 3.0–12.0)
Neutro Abs: 7.7 10*3/uL (ref 1.4–7.7)
Neutrophils Relative %: 69.8 % (ref 43.0–77.0)
Platelets: 191 10*3/uL (ref 150.0–400.0)
RBC: 4.3 Mil/uL (ref 4.22–5.81)
RDW: 12.3 % (ref 11.5–15.5)
WBC: 11 10*3/uL — ABNORMAL HIGH (ref 4.0–10.5)

## 2018-04-18 LAB — HEMOGLOBIN A1C: Hgb A1c MFr Bld: 6.6 % — ABNORMAL HIGH (ref 4.6–6.5)

## 2018-04-18 LAB — TSH: TSH: 1.48 u[IU]/mL (ref 0.35–4.50)

## 2018-04-18 NOTE — Assessment & Plan Note (Signed)
Reports of flapping sensation in his chest intermittently.  EKG will be performed today.  We will check lab work.  Patient may need to follow-up with his cardiologist if no causes found.

## 2018-04-18 NOTE — Assessment & Plan Note (Addendum)
Chronic issue.  Continues to have issues with this.  Potentially could be a sleep apnea issue though the patient declines any sleep studies.  We will check thyroid function and basic lab work.  B12 is apparently not covered by his insurance for this diagnosis.  He declined checking this given that he may have to pay for it.

## 2018-04-18 NOTE — Assessment & Plan Note (Signed)
Noted on posterior scalp.  Could be related to an ingrown hair or other dermatologic issue.  Offered referral to dermatology though he declined.  He will monitor.

## 2018-04-18 NOTE — Assessment & Plan Note (Signed)
This has improved quite a bit since going on Midrin.  He will continue to follow with cardiology for this.

## 2018-04-18 NOTE — Progress Notes (Signed)
[name], MD Phone: [phone]  [name] is a 72 y.o. male who presents today for f/u.  CC: orthostasis, palpitations, decreased energy  Orthostasis: Patient notes this is improved significantly since going on Midrin.  He rarely has symptoms.  No syncope.  Patient notes an occasional flapping sensation in his chest over the last 2 weeks.  Lasts a very short period of time and goes away on its own.  No exertional symptoms.  When it occurs he feels a little short of breath.  He had A. fib in the setting of a cardiac procedure previously though have any recurrence on evaluation.  No chest pain.  Decreased energy: Patient describes this as feeling tired right after he gets up in the morning.  Will doze off at times during the day.  The symptoms did not improve with testosterone therapy.  He wonders if he needs his B12 checked.  Patient does note several bumps on the back of his neck just at his scalp line.  They have been intermittently there for some time.  He will pick scabs off of them.  Social History   Tobacco Use  Smoking Status Former Smoker  . Packs/day: 2.00  . Years: 40.00  . Pack years: 80.00  . Types: Cigarettes  . Last attempt to quit: 10/17/1992  . Years since quitting: 25.5  Smokeless Tobacco Never Used  Tobacco Comment   quit 1994     ROS see history of present illness  Objective  Physical Exam Vitals:   04/18/18 1345  BP: 130/70  Pulse: 89  Temp: 98 F (36.7 C)  SpO2: 98%    BP Readings from Last 3 Encounters:  04/18/18 130/70  04/02/18 132/74  03/21/18 115/74   Wt Readings from Last 3 Encounters:  04/18/18 203 lb 3.2 oz (92.2 kg)  04/02/18 200 lb (90.7 kg)  03/21/18 199 lb (90.3 kg)    Physical Exam  Constitutional: No distress.  Neck:    Cardiovascular: Normal rate, regular rhythm and normal heart sounds.  Pulmonary/Chest: Effort normal and breath sounds normal.  Musculoskeletal: He exhibits no edema.  Neurological: He is  alert.  Skin: Skin is warm and dry. He is not diaphoretic.   EKG: Sinus rhythm with PACs, rate 83 no ischemic changes  Assessment/Plan: Please see individual problem list.  Orthostatic hypotension This has improved quite a bit since going on Midrin.  He will continue to follow with cardiology for this.  Palpitation Reports of flapping sensation in his chest intermittently.  EKG will be performed today.  We will check lab work.  Patient may need to follow-up with his cardiologist if no causes found.  Chronic fatigue Chronic issue.  Continues to have issues with this.  Potentially could be a sleep apnea issue though the patient declines any sleep studies.  We will check thyroid function and basic lab work.  B12 is apparently not covered by his insurance for this diagnosis.  He declined checking this given that he may have to pay for it.  Papule Noted on posterior scalp.  Could be related to an ingrown hair or other dermatologic issue.  Offered referral to dermatology though he declined.  He will monitor.   Orders Placed This Encounter  Procedures  . CBC w/Diff  . Comp Met (CMET)  . TSH  . HgB A1c  . EKG 12-Lead    No orders of the defined types were placed in this encounter.    [name], MD [facility] -  [employer]

## 2018-04-18 NOTE — Patient Instructions (Signed)
Nice to see you. We will get lab work today and contact you with the results. If you develop recurrent palpitations, chest pain, or trouble breathing please be evaluated immediately.

## 2018-04-24 ENCOUNTER — Other Ambulatory Visit: Payer: Self-pay | Admitting: Family Medicine

## 2018-04-24 ENCOUNTER — Ambulatory Visit: Payer: Medicare Other | Admitting: Family Medicine

## 2018-04-29 ENCOUNTER — Other Ambulatory Visit: Payer: Self-pay | Admitting: Family Medicine

## 2018-04-29 MED ORDER — TRIAMCINOLONE ACETONIDE 0.1 % EX CREA: 1.0000 "application " | TOPICAL_CREAM | Freq: Two times a day (BID) | CUTANEOUS | 0 refills | Status: DC

## 2018-04-29 MED ORDER — METFORMIN HCL 500 MG PO TABS: 500.0000 mg | ORAL_TABLET | Freq: Two times a day (BID) | ORAL | 3 refills | Status: DC

## 2018-05-05 ENCOUNTER — Ambulatory Visit: Payer: Medicare Other | Admitting: Family Medicine

## 2018-05-06 ENCOUNTER — Encounter: Payer: Self-pay | Admitting: Cardiology

## 2018-05-06 ENCOUNTER — Ambulatory Visit (INDEPENDENT_AMBULATORY_CARE_PROVIDER_SITE_OTHER): Payer: Medicare Other | Admitting: Cardiology

## 2018-05-06 VITALS — BP 124/60 | HR 99 | Ht 68.5 in | Wt 202.8 lb

## 2018-05-06 DIAGNOSIS — I208 Other forms of angina pectoris: Secondary | ICD-10-CM

## 2018-05-06 DIAGNOSIS — E785 Hyperlipidemia, unspecified: Secondary | ICD-10-CM | POA: Diagnosis not present

## 2018-05-06 DIAGNOSIS — R002 Palpitations: Secondary | ICD-10-CM | POA: Diagnosis not present

## 2018-05-06 DIAGNOSIS — I10 Essential (primary) hypertension: Secondary | ICD-10-CM

## 2018-05-06 DIAGNOSIS — I951 Orthostatic hypotension: Secondary | ICD-10-CM

## 2018-05-06 DIAGNOSIS — R5382 Chronic fatigue, unspecified: Secondary | ICD-10-CM | POA: Diagnosis not present

## 2018-05-06 DIAGNOSIS — H269 Unspecified cataract: Secondary | ICD-10-CM

## 2018-05-06 DIAGNOSIS — I25118 Atherosclerotic heart disease of native coronary artery with other forms of angina pectoris: Secondary | ICD-10-CM | POA: Diagnosis not present

## 2018-05-06 DIAGNOSIS — R0600 Dyspnea, unspecified: Secondary | ICD-10-CM

## 2018-05-06 DIAGNOSIS — Z951 Presence of aortocoronary bypass graft: Secondary | ICD-10-CM

## 2018-05-06 DIAGNOSIS — R0609 Other forms of dyspnea: Secondary | ICD-10-CM

## 2018-05-06 DIAGNOSIS — I480 Paroxysmal atrial fibrillation: Secondary | ICD-10-CM | POA: Diagnosis not present

## 2018-05-06 DIAGNOSIS — I70213 Atherosclerosis of native arteries of extremities with intermittent claudication, bilateral legs: Secondary | ICD-10-CM

## 2018-05-06 DIAGNOSIS — R42 Dizziness and giddiness: Secondary | ICD-10-CM | POA: Diagnosis not present

## 2018-05-06 NOTE — Assessment & Plan Note (Signed)
I have stopped beta-blocker but no significant improvement. We will try a 1 month statin holiday.  I am going to evaluate for ischemia with Myoview stress test. Otherwise will defer to PCP for further evaluation.

## 2018-05-06 NOTE — Assessment & Plan Note (Signed)
PCP indicates that his symptoms improved with midodrine.  He did not seem to think anything was different.  I do think he probably seems better overall.  Blood pressure is improved. Continue current dose midodrine and hold off on restarting beta-blocker.

## 2018-05-06 NOTE — Assessment & Plan Note (Signed)
Again multifactorial most likely, however since his symptoms seem to progress, need to exclude ischemia.  He does appear to be euvolemic and does not appear to have any active A. fib.  Plan: Lexiscan Myoview. Continue to recommend exercise depending on Myoview results.

## 2018-05-06 NOTE — Assessment & Plan Note (Signed)
Clinically, it seems to have improved based on PCPs evaluation with midodrine.  Continue to hold beta-blocker for concern orthostatic hypotension symptoms.  No arrhythmias noted. I think he is a little bit concerned with the neurology evaluation that he had would like to have a second opinion.  Prior to going forward with an MRI, he would like to see if this is what someone else would also recommend. It certainly would not be an issue to give him sedation for an MRI. Plan: Continue Midrin. Refer to [facility] neurology for second opinion neurology evaluation.

## 2018-05-06 NOTE — Assessment & Plan Note (Signed)
No longer an issue -- now troubled with orthostatic hypotension..  Hold off on restarting beta-blocker.

## 2018-05-06 NOTE — Assessment & Plan Note (Signed)
Relatively stable ABIs.  Abnormal TBI's, but not to the point where he feels that he would want to have invasive treatment. -- >  Would probably require aortobifem bypass

## 2018-05-06 NOTE — Patient Instructions (Signed)
Medication Instructions:  Do a one month holiday ( DO NOT TAKE) - ATORVASTATIN  Then restart.  If you need a refill on your cardiac medications before your next appointment, please call your pharmacy.   Lab work: Not needed  If you have labs (blood work) drawn today and your tests are completely normal, you will receive your results only by: Marland Kitchen MyChart Message (if you have MyChart) OR . A paper copy in the mail If you have any lab test that is abnormal or we need to change your treatment, we will call you to review the results.  Testing/Procedures: Schedule at  [facility] Your physician has requested that you have a lexiscan myoview. For further information please visit [email or web address] Please follow instruction sheet, as given.    Follow-Up: At [facility], you and your health needs are our priority.  As part of our continuing mission to provide you with exceptional heart care, we have created designated Provider Care Teams.  These Care Teams include your primary Cardiologist (physician) and Advanced Practice Providers (APPs -  Physician Assistants and Nurse Practitioners) who all work together to provide you with the care you need, when you need it. You will need a follow up appointment in 4 months.  Please call our office 2 months in advance to schedule this appointment.  You may see Bryan Lemma, MD or one of the following Advanced Practice Providers on your designated Care Team:   Theodore Demark, PA-C . Joni Reining, DNP, ANP  Any Other Special Instructions Will Be Listed Below (If Applicable).

## 2018-05-06 NOTE — Assessment & Plan Note (Signed)
Cannot exclude ischemic etiology for his fatigue and dyspnea on exertion.  He does not have the classic symptoms that he had back when we send in for CABG, but just has not really turned the corner over this past year.   He would be due for Myoview stress test next year, but since he is having symptoms now, we will go ahead and proceed with Lexiscan Myoview now to exclude ischemia.

## 2018-05-06 NOTE — Assessment & Plan Note (Signed)
Heart a telephone he is having is an anginal equivalent or not.  I mentioned above, prior to his CABG he actually noted chest discomfort.  However it is possible that he could be having some atypical features.  He is currently on statin and Plavix although may do a statin holiday. We stopped beta-blocker because of fatigue and orthostatic dizziness.  Plan: Check Lexiscan Myoview to exclude new ischemic findings

## 2018-05-06 NOTE — Assessment & Plan Note (Addendum)
Not having angina, but definitely having significant exertional fatigue and dyspnea. As per prior note, plan will be to have been rechecking and Myoview stress test next fall prior to follow-up, however I think we will go ahead and check one now since he is actively having symptoms.  Plan: Lexiscan Myoview. Continue statin --but will do 1 month statin holiday. No beta-blocker because of fatigue and with static hypotension. On Plavix without aspirin.--Okay to hold for procedures.

## 2018-05-06 NOTE — Assessment & Plan Note (Signed)
Notes occasional flip-flopping, but nothing that really bothers him.  Pretty well controlled.  No real change having stopped beta-blocker.

## 2018-05-06 NOTE — Assessment & Plan Note (Signed)
Current LDL is pretty much stable now 75.  Would like it to be less than 50, and we may need to consider converting to a different medication.  Unfortunately for now with his significant leg aching and cramping would like him to do a month-long statin holiday.  Would need to consider changing medications depending on how his symptoms respond.

## 2018-05-06 NOTE — Assessment & Plan Note (Signed)
No breakthrough episodes.  Nothing on 48-hour monitor. On Plavix, without any breakthrough, would not fully intact regulate.

## 2018-05-06 NOTE — Progress Notes (Signed)
PCP: Glori Luis, MD  Clinic Note: Chief Complaint  Patient presents with  . Follow-up    Still lightheaded and dizzy, fatigued  . Coronary Artery Disease  . PAD  . Dizziness    HPI: [name] is a 72 y.o. male with a PMH below who presents today for sooner that planned f/u for low BP & dizzy spells. He has a history of CAD noticed for progressive angina (described as exertional chest burning and tightness associate with left arm discomfort --this was worse with activity, especially walking up incline.  Usually relieved with rest.  Lasted less than 1 minute most the time.  No recurrence at rest.).  And referred for CABG for multivessel disease. He also has long-standing known occlusion of the distal aorta (Leriche Syndrome).  He had atrial fibrillation in the setting of his progressive angina and pre-/peri-CABG timeframe --as of last evaluation, he has not had any breakthrough A. fib episodes.  [name] was last seen on August 22 following up 48-hour monitor by [facility] clinic for episodic dizziness spells.  [facility] 1-2 min @ a time.  Notes drop in BP during spells followed by feeling lethargic.  Metoprolol has been held.  I stopped stopped completely and added midodrine last visit.  Recent Hospitalizations: None  Studies Personally Reviewed - (if available, images/films reviewed: From Epic Chart or Care Everywhere)  Bilateral ABIs September 6 -> abnormal bilateral TBI but with normal resting ABIs bilaterally.  Interval History: Kasten returns here today for follow-up evaluation of his lightheaded spells.  He says that he really has not noticed that much difference being on Midrin versus not.  He has not had any falls with the exception of one episode where he did not see a step up in a restaurant and fell while going to the bathroom. He says that he always feels a little bit weird, dizzy and  lightheaded.  He thinks it may be related to his vision because of his left eye seems to have worse vision than the right.  Apparently neurology did not feel like it was neurologic in nature, but they still want to do an MRI.  He was scared to do an MRI because of claustrophobia.  Apparently, they felt he needed cardiac clearance to sedate him for an MRI which is totally unnecessary.  He is hoping that may be have a second opinion from cardiology. He says the both legs ache are stiff constantly.  He says that after he walks his dog, he gets back and he just totally exhausted.  Denies any chest tightness or pressure or even any significant exertional dyspnea.  Minimal edema.  No PND orthopnea.  He has not had any loss of consciousness, but has felt a little bit dizzy.  Not really presyncope.  No TIA or amaurosis fugax symptoms. No sensation of rapid irregular heartbeats palpitations.  He says his palpitations have all but gone away.  Episodes described as following: He describes a sensation of being the "hot rush coming up the back of his neck to his head like a rush" he then feels lightheaded and occasionally nauseated with some blurry vision.  He even has some point having some difficulty with speech.  No melena, hematochezia, hematuria, or epstaxis. + stable claudication.  ROS: A comprehensive was performed.  Pertinent symptoms noted in HPI Review of Systems  Constitutional: Positive for malaise/fatigue. Negative for weight loss (wgt gain).  HENT: Negative for nosebleeds.   Eyes: Positive for blurred vision and double vision.       Left eye seems to be less focused.  May need a new prescription.  He says his depth perception is off  Cardiovascular: Positive for claudication.  Gastrointestinal: Positive for heartburn. Negative for abdominal pain, blood in stool and melena.  Genitourinary: Negative for dysuria and hematuria.  Musculoskeletal: Positive for falls (see HPI -- tripped) and joint  pain.  Neurological: Positive for dizziness (Lightheadedness.) and weakness (during these spells).       Unsteady gait -- concerned about depth perception  Psychiatric/Behavioral: Negative for depression. The patient is nervous/anxious (becuase of these Sx.).   All other systems reviewed and are negative.  I have reviewed and (if needed) personally updated the patient's problem list, medications, allergies, past medical and surgical history, social and family history.   Past Medical History:  Diagnosis Date  . Arthritis   . CAD, multiple vessel 03/2105   LM 65%, mLAD 70%, 90% & 80%, o-pCx 75%, mCx 95% & d Cx 70%, OM1 60%, mRCA 90% with 100% dRCA. R-R collaterals  . Chronic distal aortic occlusion (HCC)    By report  . Chronic kidney disease   . COPD (chronic obstructive pulmonary disease) (HCC)   . Depression   . Enlarged prostate   . Erectile dysfunction   . Essential hypertension   . History of kidney stones    multiple lithotripsies  . Hyperlipidemia LDL goal <70   . Hypogonadism in male    Previously on testosterone injections, but has not yet restarted until further testing  . Muscle pain   . PAF (paroxysmal atrial fibrillation) (HCC) 03/2015   Noted per-cath & post-op CABG.  Dr. [name], cardiology  . Peripheral vascular disease (HCC)    Bilateral ABIs 0.8  . Renal stones 03/2016  . S/P CABG x 5 04/04/2015   LIMA to LAD, SVG to DIAG, SVG to OM, seqSVG -PDA- dCx  . SOB (shortness of breath)   . 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 Weakness    weakness bilateral legs, s/p vein harvesting for CABPG    Past Surgical History:  Procedure Laterality Date  . 48-HOUR HOLTER MONITOR  02/2018   Sinus rhythm with some sinus bradycardia and sinus tachycardia.  Rare PVCs.  3 episodes of trigeminy for a total of 51 beats.  Frequent PACs no runs, but 3 pairs noted.  12 bigeminal episodes noted.  No arrhythmia.  Minimum heart rate 55 bpm, max  135 bpm. ->  Reportedly had 2 events while wearing monitor, but no obvious abnormality associated  . BLEPHAROPLASTY    . CARDIAC CATHETERIZATION  1992  . CARDIAC CATHETERIZATION N/A 03/31/2015   Procedure: Left Heart Cath and Coronary Angiography;  Surgeon: Marykay Lex, MD;  Location: [facility] INVASIVE CV LAB: LM 65%, mLAD 70%, 90% & 80%, o-pCx 75%, mCx 95% & d Cx 70%, OM1 60%, mRCA 90% with 100% dRCA. R-R collaterals --> CABG referral. Also noted to be in Afib.  . Carotid Dopplers  09/2017   Patent vertebrals. Mild bilateral carotid plaque, <50% stenosis bilaterally.  . CHOLECYSTECTOMY  2000   also had revision of hernia repair  . CORONARY ARTERY BYPASS GRAFT N/A 04/04/2015   Procedure: CORONARY ARTERY BYPASS GRAFTING (CABG);  Surgeon: [name], MD;  Location: [facility] OR;  Service: Open Heart Surgery;  Laterality: N/A;  . CYSTECTOMY  face and scrotum  . CYSTOSCOPY WITH INSERTION OF UROLIFT N/A 11/12/2016   Procedure: CYSTOSCOPY WITH INSERTION OF UROLIFT x4;  Surgeon: Jethro Bolus, MD;  Location: [facility] ORS;  Service: Urology;  Laterality: N/A;  . CYSTOSCOPY WITH RETROGRADE PYELOGRAM, URETEROSCOPY AND STENT PLACEMENT Right 06/26/2016   Procedure: CYSTOSCOPY WITH RIGHT RETROGRADE PYELOGRAM, RIGHT URETEROSCOPY , HOLMIUM LASER, BASKET EXTRACTION AND STENT PLACEMENT;  Surgeon: Jethro Bolus, MD;  Location: [facility] ORS;  Service: Urology;  Laterality: Right;  . CYSTOSCOPY WITH RETROGRADE PYELOGRAM, URETEROSCOPY AND STENT PLACEMENT Left 09/23/2017   Procedure: CYSTOSCOPY WITH LEFT RETROGRADE PYELOGRAM, URETEROSCOPY STONE EXTRACTION AND STENT PLACEMENT;  Surgeon: Crista Elliot, MD;  Location: [facility] ORS;  Service: Urology;  Laterality: Left;  . LEXISCAN MYOVIEW  June 2010   converted to [facility] from treadmill due to inability to reach target heart rate) --the study was normal with no evidence of ischemia and normal LV function. EF 7  . LITHOTRIPSY    . TEE WITHOUT CARDIOVERSION N/A 04/04/2015    Procedure: TRANSESOPHAGEAL ECHOCARDIOGRAM (TEE);  Surgeon: [name], MD;  Location: [facility] OR;  Service: Open Heart Surgery;  Laterality: N/A;  . TRANSTHORACIC ECHOCARDIOGRAM  12/30/2016   EF 60-65%. GR 1 DD. Mildly dilated ascending aorta. Otherwise essentially normal with no valve lesions.  Marland Kitchen VASECTOMY  1975  . VENTRAL HERNIA REPAIR  1994   complicated by infection    Current Meds  Medication Sig  . atorvastatin (LIPITOR) 40 MG tablet TAKE 1 TABLET (40 MG TOTAL) BY MOUTH DAILY.  Marland Kitchen clopidogrel (PLAVIX) 75 MG tablet TAKE 1 TABLET EVERY DAY  . metFORMIN (GLUCOPHAGE) 500 MG tablet Take 1 tablet (500 mg total) by mouth 2 (two) times daily with a meal.  . midodrine (PROAMATINE) 2.5 MG tablet Take 1 tablet (2.5 mg total) by mouth 2 (two) times daily with a meal.  . NITROSTAT 0.4 MG SL tablet PLACE 1 TABLET (0.4 MG TOTAL) UNDER THE TONGUE EVERY 5 (FIVE) MINUTES AS NEEDED FOR CHEST PAIN.  . tamsulosin (FLOMAX) 0.4 MG CAPS capsule Take 0.4 mg by mouth daily.   Marland Kitchen triamcinolone cream (KENALOG) 0.1 % Apply 1 application topically 2 (two) times daily.    No Known Allergies  Social History   Tobacco Use  . Smoking status: Former Smoker    Packs/day: 2.00    Years: 40.00    Pack years: 80.00    Types: Cigarettes    Last attempt to quit: 10/17/1992    Years since quitting: 25.5  . Smokeless tobacco: Never Used  . Tobacco comment: quit 1994  Substance Use Topics  . Alcohol use: Yes    Alcohol/week: 1.0 standard drinks    Types: 1 Standard drinks or equivalent per week    Comment: seldom - vodka or wine  . Drug use: No   Social History   Social History Narrative   Lives in [place] with wife. No pets   Two children.      Former smoker. Quit in 1994. Prior to quitting was unable to walk without exertional dyspnea.      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      Hobbies - reading, drives Corvette, chuch     family history includes Coronary artery disease in his father; Hearing loss in his maternal grandmother and mother; Heart disease in his father; Stroke in his mother and sister.  Wt Readings from Last 3 Encounters:  05/06/18  202 lb 12.8 oz (92 kg)  04/18/18 203 lb 3.2 oz (92.2 kg)  04/02/18 200 lb (90.7 kg)    PHYSICAL EXAM BP 124/60   Pulse 99   Ht 5' 8.5" (1.74 m)   Wt 202 lb 12.8 oz (92 kg)   SpO2 97%   BMI 30.39 kg/m  Physical Exam  Constitutional: He is oriented to person, place, and time. He appears well-developed and well-nourished. No distress.  He appears older than his stated age. Appears to be declining in functional status.   HENT:  Head: Normocephalic and atraumatic.  Neck: Normal range of motion. Neck supple. No hepatojugular reflux and no JVD present. Carotid bruit is not present.  Cardiovascular: Normal rate, regular rhythm and normal heart sounds.  Occasional extrasystoles are present. PMI is not displaced. Exam reveals decreased pulses (BLE - Pedal &  DP pulses). Exam reveals no gallop and no friction rub.  No murmur heard. Pulmonary/Chest: Effort normal and breath sounds normal. No respiratory distress. He has no wheezes. He has no rales.  Abdominal: Soft. Bowel sounds are normal. He exhibits no distension. There is no tenderness. There is no rebound.  Truncal obesity.   No HSM  Musculoskeletal: Normal range of motion. He exhibits no edema (trivial).  Neurological: He is alert and oriented to person, place, and time.  Psychiatric: He has a normal mood and affect. His behavior is normal. Judgment and thought content normal.  Vitals reviewed.    Adult ECG Report n/a  Other studies Reviewed: Additional studies/ records that were reviewed today include:  Recent Labs:  See below Lab Results  Component Value Date   CHOL 155 04/03/2018   HDL 48 04/03/2018   LDLCALC 75 04/03/2018   TRIG 161 (H) 04/03/2018   CHOLHDL 3.2 04/03/2018   Lab Results   Component Value Date   CREATININE 1.10 04/18/2018   BUN 30 (H) 04/18/2018   NA 139 04/18/2018   K 4.1 04/18/2018   CL 104 04/18/2018   CO2 29 04/18/2018    ASSESSMENT / PLAN: Problem List Items Addressed This Visit    Atherosclerosis of native arteries of extremity with intermittent claudication (HCC) (Chronic)    Relatively stable ABIs.  Abnormal TBI's, but not to the point where he feels that he would want to have invasive treatment. -- >  Would probably require aortobifem bypass      Atypical angina (HCC)    Cannot exclude ischemic etiology for his fatigue and dyspnea on exertion.  He does not have the classic symptoms that he had back when we send in for CABG, but just has not really turned the corner over this past year.   He would be due for Myoview stress test next year, but since he is having symptoms now, we will go ahead and proceed with Lexiscan Myoview now to exclude ischemia.      Relevant Orders   NM Myocar Multi W/Spect W/Wall Motion / EF   Cataracts, bilateral    Not sure if is related to cataracts or something else, but it seems like his vision is changing.  Sounds like his left eye is worse now.  This may be affecting his depth perception and therefore could be associated with his dizziness.  I recommend that he go see his ophthalmologist sooner rather than later.      Chronic fatigue (Chronic)    I have stopped beta-blocker but no significant improvement. We will try a 1 month statin holiday.  I am going  to evaluate for ischemia with Myoview stress test. Otherwise will defer to PCP for further evaluation.       Relevant Orders   Ambulatory referral to Neurology   Coronary artery disease involving native coronary artery with other form of angina pectoris, unspecified whether native or transplanted heart (HCC) - Primary (Chronic)    Heart a telephone he is having is an anginal equivalent or not.  I mentioned above, prior to his CABG he actually noted chest  discomfort.  However it is possible that he could be having some atypical features.  He is currently on statin and Plavix although may do a statin holiday. We stopped beta-blocker because of fatigue and orthostatic dizziness.  Plan: Check Lexiscan Myoview to exclude new ischemic findings      Dizziness of unknown cause (Chronic)    Clinically, it seems to have improved based on PCPs evaluation with midodrine.  Continue to hold beta-blocker for concern orthostatic hypotension symptoms.  No arrhythmias noted. I think he is a little bit concerned with the neurology evaluation that he had would like to have a second opinion.  Prior to going forward with an MRI, he would like to see if this is what someone else would also recommend. It certainly would not be an issue to give him sedation for an MRI. Plan: Continue Midrin. Refer to [facility] neurology for second opinion neurology evaluation.      Relevant Orders   Ambulatory referral to Neurology   DOE (dyspnea on exertion)    Again multifactorial most likely, however since his symptoms seem to progress, need to exclude ischemia.  He does appear to be euvolemic and does not appear to have any active A. fib.  Plan: Lexiscan Myoview. Continue to recommend exercise depending on Myoview results.      Relevant Orders   NM Myocar Multi W/Spect W/Wall Motion / EF   Dyslipidemia, goal LDL below 70 (Chronic)    Current LDL is pretty much stable now 75.  Would like it to be less than 50, and we may need to consider converting to a different medication.  Unfortunately for now with his significant leg aching and cramping would like him to do a month-long statin holiday.  Would need to consider changing medications depending on how his symptoms respond.      Essential hypertension (Chronic)    No longer an issue -- now troubled with orthostatic hypotension..  Hold off on restarting beta-blocker.      Orthostatic hypotension (Chronic)    PCP  indicates that his symptoms improved with midodrine.  He did not seem to think anything was different.  I do think he probably seems better overall.  Blood pressure is improved. Continue current dose midodrine and hold off on restarting beta-blocker.      PAF (paroxysmal atrial fibrillation) (HCC) (Chronic)    No breakthrough episodes.  Nothing on 48-hour monitor. On Plavix, without any breakthrough, would not fully intact regulate.      Relevant Orders   NM Myocar Multi W/Spect W/Wall Motion / EF   Palpitation (Chronic)    Notes occasional flip-flopping, but nothing that really bothers him.  Pretty well controlled.  No real change having stopped beta-blocker.      S/P CABG x 5 (Chronic)    Not having angina, but definitely having significant exertional fatigue and dyspnea. As per prior note, plan will be to have been rechecking and Myoview stress test next fall prior to follow-up, however I think we will  go ahead and check one now since he is actively having symptoms.  Plan: Lexiscan Myoview. Continue statin --but will do 1 month statin holiday. No beta-blocker because of fatigue and with static hypotension. On Plavix without aspirin.--Okay to hold for procedures.         I spent over 40 minutes with the patient today.  We discussed his symptoms and potential options of treatment.  Greater than 50% of the time was spent in direct patient counseling.  Current medicines are reviewed at length with the patient today.  (+/- concerns) no real benefit from midodrine The following changes have been made:  see below.  Patient Instructions  Medication Instructions:  Do a one month holiday ( DO NOT TAKE) - ATORVASTATIN  Then restart.  If you need a refill on your cardiac medications before your next appointment, please call your pharmacy.   Lab work: Not needed  If you have labs (blood work) drawn today and your tests are completely normal, you will receive your results only  by: Marland Kitchen MyChart Message (if you have MyChart) OR . A paper copy in the mail If you have any lab test that is abnormal or we need to change your treatment, we will call you to review the results.  Testing/Procedures: Schedule at  [facility] Your physician has requested that you have a lexiscan myoview. For further information please visit [email or web address] Please follow instruction sheet, as given.    Follow-Up: At [facility], you and your health needs are our priority.  As part of our continuing mission to provide you with exceptional heart care, we have created designated Provider Care Teams.  These Care Teams include your primary Cardiologist (physician) and Advanced Practice Providers (APPs -  Physician Assistants and Nurse Practitioners) who all work together to provide you with the care you need, when you need it. You will need a follow up appointment in 4 months.  Please call our office 2 months in advance to schedule this appointment.  You may see Bryan Lemma, MD or one of the following Advanced Practice Providers on your designated Care Team:   Theodore Demark, PA-C . Joni Reining, DNP, ANP  Any Other Special Instructions Will Be Listed Below (If Applicable).    Studies Ordered:   Orders Placed This Encounter  Procedures  . NM Myocar Multi W/Spect W/Wall Motion / EF  . Ambulatory referral to Neurology      Bryan Lemma, M.D., M.S. Interventional Cardiologist   Pager # [phone] Phone # [phone] [address] [place], [place] [address]   Thank you for choosing Heartcare at [facility]!!

## 2018-05-06 NOTE — Assessment & Plan Note (Signed)
Not sure if is related to cataracts or something else, but it seems like his vision is changing.  Sounds like his left eye is worse now.  This may be affecting his depth perception and therefore could be associated with his dizziness.  I recommend that he go see his ophthalmologist sooner rather than later.

## 2018-05-13 ENCOUNTER — Encounter
Admission: RE | Admit: 2018-05-13 | Discharge: 2018-05-13 | Disposition: A | Discharge status: Home / Self Care | Payer: Medicare Other | Source: Ambulatory Visit | Attending: Cardiology | Admitting: Cardiology

## 2018-05-13 ENCOUNTER — Telehealth: Payer: Self-pay | Admitting: Cardiology

## 2018-05-13 DIAGNOSIS — I208 Other forms of angina pectoris: Secondary | ICD-10-CM

## 2018-05-13 DIAGNOSIS — R0600 Dyspnea, unspecified: Secondary | ICD-10-CM

## 2018-05-13 DIAGNOSIS — R0609 Other forms of dyspnea: Secondary | ICD-10-CM | POA: Insufficient documentation

## 2018-05-13 DIAGNOSIS — I25118 Atherosclerotic heart disease of native coronary artery with other forms of angina pectoris: Secondary | ICD-10-CM | POA: Insufficient documentation

## 2018-05-13 DIAGNOSIS — I480 Paroxysmal atrial fibrillation: Secondary | ICD-10-CM | POA: Insufficient documentation

## 2018-05-13 MED ORDER — TECHNETIUM TC 99M TETROFOSMIN IV KIT
9.8900 | PACK | Freq: Once | INTRAVENOUS | Status: AC | PRN
  Administered 2018-05-13: 9.89 via INTRAVENOUS

## 2018-05-13 NOTE — Telephone Encounter (Signed)
New Message          Bonita Quin with Nuclear Med at [facility] called to let Dr. [name] know that patient refused the stress test today because he is claustrophobic. Pls call and advise.

## 2018-05-13 NOTE — Telephone Encounter (Signed)
SPOKE TO PATIENT . PATIENT STATES THE MACHINE WAS TO CLOSE TO HIS FACE AND HE COULD NOT TOLERATE.   RN ASKED IF PATIENT COULD COME TO THE [facility] OFFICE  - TO SEE IF HE IS ABLE TO  TOLERATE THE MACHINE. PATIENT IS AGREEABLE - WILL BE HERE AT 12:30 ON 05/20/18 - PAM -- NUC TECH . NOTIFIED

## 2018-05-16 HISTORY — PX: NM MYOVIEW LTD: HXRAD82

## 2018-05-21 ENCOUNTER — Ambulatory Visit: Payer: Medicare Other | Admitting: Neurology

## 2018-05-22 ENCOUNTER — Other Ambulatory Visit: Payer: Self-pay | Admitting: *Deleted

## 2018-05-22 ENCOUNTER — Encounter: Payer: Self-pay | Admitting: Cardiology

## 2018-05-22 DIAGNOSIS — I25118 Atherosclerotic heart disease of native coronary artery with other forms of angina pectoris: Secondary | ICD-10-CM

## 2018-05-22 DIAGNOSIS — R5382 Chronic fatigue, unspecified: Secondary | ICD-10-CM

## 2018-05-22 DIAGNOSIS — I208 Other forms of angina pectoris: Secondary | ICD-10-CM

## 2018-05-22 NOTE — Progress Notes (Signed)
PATIENT CAME BY OFFICE ON 05/20/18 TO SEE IF HE MAYBE TO TOLERATE HAVING LEXISCAN MYOVIEW.  PATIENT STATES HE COMPLETE THE TEST.  ORDER PLACED.

## 2018-05-29 ENCOUNTER — Telehealth (HOSPITAL_COMMUNITY): Payer: Self-pay

## 2018-05-29 NOTE — Telephone Encounter (Signed)
Encounter complete. 

## 2018-06-03 ENCOUNTER — Ambulatory Visit (HOSPITAL_COMMUNITY)
Admission: RE | Admit: 2018-06-03 | Discharge: 2018-06-03 | Disposition: A | Discharge status: Home / Self Care | Payer: Medicare Other | Source: Ambulatory Visit | Attending: Cardiovascular Disease | Admitting: Cardiovascular Disease

## 2018-06-03 DIAGNOSIS — I25118 Atherosclerotic heart disease of native coronary artery with other forms of angina pectoris: Secondary | ICD-10-CM | POA: Diagnosis not present

## 2018-06-03 DIAGNOSIS — I208 Other forms of angina pectoris: Secondary | ICD-10-CM | POA: Insufficient documentation

## 2018-06-03 DIAGNOSIS — R5382 Chronic fatigue, unspecified: Secondary | ICD-10-CM | POA: Diagnosis not present

## 2018-06-03 LAB — MYOCARDIAL PERFUSION IMAGING
LV dias vol: 71 mL (ref 62–150)
LV sys vol: 34 mL
Peak HR: 105 {beats}/min
Rest HR: 81 {beats}/min
SDS: 2
SRS: 1
SSS: 3
TID: 1.31

## 2018-06-03 MED ORDER — AMINOPHYLLINE 25 MG/ML IV SOLN
75.0000 mg | Freq: Once | INTRAVENOUS | Status: AC
  Administered 2018-06-03: 75 mg via INTRAVENOUS

## 2018-06-03 MED ORDER — TECHNETIUM TC 99M TETROFOSMIN IV KIT
31.2000 | PACK | Freq: Once | INTRAVENOUS | Status: AC | PRN
  Administered 2018-06-03: 31.2 via INTRAVENOUS
  Filled 2018-06-03: qty 32

## 2018-06-03 MED ORDER — TECHNETIUM TC 99M TETROFOSMIN IV KIT
10.8000 | PACK | Freq: Once | INTRAVENOUS | Status: AC | PRN
  Administered 2018-06-03: 10.8 via INTRAVENOUS
  Filled 2018-06-03: qty 11

## 2018-06-03 MED ORDER — REGADENOSON 0.4 MG/5ML IV SOLN
0.4000 mg | Freq: Once | INTRAVENOUS | Status: AC
  Administered 2018-06-03: 0.4 mg via INTRAVENOUS

## 2018-06-05 ENCOUNTER — Telehealth: Payer: Self-pay | Admitting: *Deleted

## 2018-06-05 NOTE — Telephone Encounter (Signed)
The patient has been notified of the result and verbalized understanding.  All questions (if any) were answered. Tobin ChadMartin, Liem Copenhaver V, RN 06/05/2018 12:55 PM   Patient wanted to know   If DR HARDING HAD ANY RECOMMENDATION ON WHO THE PATIENT SHOULD SEE ABOUT HIS FATIGUE IF IT IS NOT HEART RELATED. CAN HE EXERCISE AND USE WEIGHTS- ISSUE WITH STERNUM.  RN INFORMED PATIENT CAN EXERCISE , NEED CONTACT SURGEON ABOUT THE WEIGHT LIMITATIONS

## 2018-06-05 NOTE — Telephone Encounter (Signed)
-----   Message from Marykay Lexavid W Harding, MD sent at 06/03/2018  9:02 PM EST ----- Good news on the nuclear stress test: Pump function is on the low end of normal but stable.  No evidence of ischemia meaning no evidence of decreased blood flow with exertion then noted and rest.  There is an area of what looks like it could either be an artifact or prior heart attack.  It does not look like it is an active location.  Studies read as LOW RISK for a cardiac cause for chest pain or shortness of breath.  Bryan Lemmaavid Harding, MD

## 2018-06-06 NOTE — Telephone Encounter (Signed)
Spoke to patient   Information give to patient from Dr Herbie BaltimoreHarding message.  patient verbalized understanding

## 2018-06-06 NOTE — Telephone Encounter (Signed)
I think looking into evaluation fatigue is probably something that his PCP will want to kind of consider what all is been done and then go through further evaluations.  As far as exercise, I agree he can exercise as much as he wants to.  Would be nice to get a clarification from the CT surgeon as far as any weight limitations though.  A phone call to their nurse would be the way to start.  Bryan Lemmaavid , MD

## 2018-06-24 ENCOUNTER — Encounter: Payer: Self-pay | Admitting: Family Medicine

## 2018-06-24 ENCOUNTER — Ambulatory Visit (INDEPENDENT_AMBULATORY_CARE_PROVIDER_SITE_OTHER): Payer: Medicare Other | Admitting: Family Medicine

## 2018-06-24 VITALS — BP 90/48 | HR 104 | Temp 97.8°F | Ht 68.5 in | Wt 206.4 lb

## 2018-06-24 DIAGNOSIS — M25552 Pain in left hip: Secondary | ICD-10-CM | POA: Diagnosis not present

## 2018-06-24 DIAGNOSIS — G8929 Other chronic pain: Secondary | ICD-10-CM

## 2018-06-24 DIAGNOSIS — R42 Dizziness and giddiness: Secondary | ICD-10-CM

## 2018-06-24 DIAGNOSIS — I951 Orthostatic hypotension: Secondary | ICD-10-CM | POA: Diagnosis not present

## 2018-06-24 DIAGNOSIS — M25511 Pain in right shoulder: Secondary | ICD-10-CM | POA: Diagnosis not present

## 2018-06-24 MED ORDER — PREDNISONE 10 MG (21) PO TBPK: ORAL_TABLET | ORAL | 0 refills | Status: DC

## 2018-06-24 MED ORDER — METHYLPREDNISOLONE ACETATE 40 MG/ML IJ SUSP
40.0000 mg | Freq: Once | INTRAMUSCULAR | Status: AC
  Administered 2018-06-24: 40 mg via INTRAMUSCULAR
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
## 2018-06-24 NOTE — Patient Instructions (Addendum)
Increase midodrine to 5 mg (2 tablets of your 2.5mg ) 2 times per day to see if this helps BP and dizziness

## 2018-07-04 ENCOUNTER — Encounter: Payer: Self-pay | Admitting: Family Medicine

## 2018-07-04 ENCOUNTER — Ambulatory Visit (INDEPENDENT_AMBULATORY_CARE_PROVIDER_SITE_OTHER): Payer: Medicare Other | Admitting: Family Medicine

## 2018-07-04 VITALS — BP 98/58 | HR 98 | Temp 97.5°F | Ht 69.0 in | Wt 209.2 lb

## 2018-07-04 DIAGNOSIS — E1169 Type 2 diabetes mellitus with other specified complication: Secondary | ICD-10-CM | POA: Diagnosis not present

## 2018-07-04 DIAGNOSIS — L299 Pruritus, unspecified: Secondary | ICD-10-CM | POA: Diagnosis not present

## 2018-07-04 DIAGNOSIS — I951 Orthostatic hypotension: Secondary | ICD-10-CM | POA: Diagnosis not present

## 2018-07-04 DIAGNOSIS — R42 Dizziness and giddiness: Secondary | ICD-10-CM | POA: Diagnosis not present

## 2018-07-04 DIAGNOSIS — G4700 Insomnia, unspecified: Secondary | ICD-10-CM | POA: Diagnosis not present

## 2018-07-04 LAB — COMPREHENSIVE METABOLIC PANEL
ALT: 21 U/L (ref 0–53)
AST: 14 U/L (ref 0–37)
Albumin: 3.8 g/dL (ref 3.5–5.2)
Alkaline Phosphatase: 55 U/L (ref 39–117)
BUN: 25 mg/dL — ABNORMAL HIGH (ref 6–23)
CO2: 28 mEq/L (ref 19–32)
Calcium: 9.2 mg/dL (ref 8.4–10.5)
Chloride: 102 mEq/L (ref 96–112)
Creatinine, Ser: 1.06 mg/dL (ref 0.40–1.50)
GFR: 72.58 mL/min (ref >60.00)
Glucose, Bld: 150 mg/dL — ABNORMAL HIGH (ref 70–99)
Potassium: 4.1 mEq/L (ref 3.5–5.1)
Sodium: 137 mEq/L (ref 135–145)
Total Bilirubin: 0.6 mg/dL (ref 0.2–1.2)
Total Protein: 6.5 g/dL (ref 6.0–8.3)

## 2018-07-04 LAB — CBC
HCT: 42.4 % (ref 39.0–52.0)
Hemoglobin: 14.6 g/dL (ref 13.0–17.0)
MCHC: 34.4 g/dL (ref 30.0–36.0)
MCV: 97.2 fl (ref 78.0–100.0)
Platelets: 195 10*3/uL (ref 150.0–400.0)
RBC: 4.36 Mil/uL (ref 4.22–5.81)
RDW: 12.8 % (ref 11.5–15.5)
WBC: 10.1 10*3/uL (ref 4.0–10.5)

## 2018-07-04 MED ORDER — MIDODRINE HCL 2.5 MG PO TABS: 5.0000 mg | ORAL_TABLET | Freq: Two times a day (BID) | ORAL | 3 refills | Status: DC

## 2018-07-04 MED ORDER — ESZOPICLONE 1 MG PO TABS: 1.0000 mg | ORAL_TABLET | Freq: Every evening | ORAL | 0 refills | Status: DC | PRN

## 2018-07-04 NOTE — Patient Instructions (Signed)
Continue midodrine 5 mg (2 tablets) two times per day, this dose seems to keep BP up better  You can try a body lotion or cream from the brands Cetaphil or CeraVe -- they are good brands for sensitive skin/dry skin.
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
## 2018-07-22 ENCOUNTER — Encounter

## 2018-07-22 ENCOUNTER — Ambulatory Visit: Payer: Medicare Other | Admitting: Family Medicine

## 2018-08-04 ENCOUNTER — Telehealth: Payer: Self-pay | Admitting: Cardiology

## 2018-08-04 DIAGNOSIS — I951 Orthostatic hypotension: Secondary | ICD-10-CM

## 2018-08-04 MED ORDER — MIDODRINE HCL 2.5 MG PO TABS: 5.0000 mg | ORAL_TABLET | Freq: Two times a day (BID) | ORAL | 0 refills | Status: DC

## 2018-08-04 NOTE — Telephone Encounter (Signed)
Rx has been sent to the pharmacy electronically. ° °

## 2018-08-04 NOTE — Telephone Encounter (Signed)
New Message    *STAT* If patient is at the pharmacy, call can be transferred to refill team.   1. Which medications need to be refilled? (please list name of each medication and dose if known) midodrine (PROAMATINE) 2.5 MG tablet  Sig: Take 2 tablets (5 mg total) by mouth 2 (two) times daily with a meal.   2. Which pharmacy/location (including street and city if local pharmacy) is medication to be sent to? [facility] - [place], [place] - [address]  3. Do they need a 30 day or 90 day supply? 90 day   Patient is requesting rx to be sent to [facility].

## 2018-08-11 ENCOUNTER — Encounter: Payer: Self-pay | Admitting: Family Medicine

## 2018-08-11 ENCOUNTER — Ambulatory Visit (INDEPENDENT_AMBULATORY_CARE_PROVIDER_SITE_OTHER): Payer: Medicare Other | Admitting: Family Medicine

## 2018-08-11 ENCOUNTER — Encounter

## 2018-08-11 VITALS — BP 120/82 | HR 96 | Ht 69.0 in | Wt 206.0 lb

## 2018-08-11 DIAGNOSIS — M7061 Trochanteric bursitis, right hip: Secondary | ICD-10-CM | POA: Diagnosis not present

## 2018-08-11 DIAGNOSIS — M5416 Radiculopathy, lumbar region: Secondary | ICD-10-CM

## 2018-08-11 DIAGNOSIS — M7062 Trochanteric bursitis, left hip: Secondary | ICD-10-CM | POA: Diagnosis not present

## 2018-08-11 NOTE — Progress Notes (Signed)
[name] [address] [place], [place] [address] Phone: [phone] Subjective:     I [name] am serving as a Neurosurgeon for Dr. Antoine Primas.    CC: Bilateral hip pain follow-up  BJY:[id number]  [name] is a 73 y.o. male coming in with complaint of bilatreal hip pain. States that his hips were feeling bad yesterday.  Patient has been seen previously.  4 months ago given injection in the bilateral greater trochanteric areas.  Concern for potential back pain.  Has had claustrophobia so has avoided this.  States some worsening weakness in the legs recently.  Rates the severity of pain is 5 out of 10 overall.  States that the pain is starting to wake him up again at night.  Does not want to take more chronic medications.  Avoiding anti-inflammatory secondary to cardiovascular if possible.     Past Medical History:  Diagnosis Date  . Arthritis   . CAD, multiple vessel 03/2105   LM 65%, mLAD 70%, 90% & 80%, o-pCx 75%, mCx 95% & d Cx 70%, OM1 60%, mRCA 90% with 100% dRCA. R-R collaterals  . Chronic distal aortic occlusion (HCC)    By report  . Chronic kidney disease   . COPD (chronic obstructive pulmonary disease) (HCC)   . Depression   . Enlarged prostate   . Erectile dysfunction   . Essential hypertension   . History of kidney stones    multiple lithotripsies  . Hyperlipidemia LDL goal <70   . Hypogonadism in male    Previously on testosterone injections, but has not yet restarted until further testing  . Muscle pain   . PAF (paroxysmal atrial fibrillation) (HCC) 03/2015   Noted per-cath & post-op CABG.  Dr. [name], cardiology  . Peripheral vascular disease (HCC)    Bilateral ABIs 0.8  . Renal stones 03/2016  . S/P CABG x 5 04/04/2015   LIMA to LAD, SVG to DIAG, SVG to OM, seqSVG -PDA- dCx  . SOB (shortness of breath)   . 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 Weakness    weakness bilateral  legs, s/p vein harvesting for CABPG   Past Surgical History:  Procedure Laterality Date  . 48-HOUR HOLTER MONITOR  02/2018   Sinus rhythm with some sinus bradycardia and sinus tachycardia.  Rare PVCs.  3 episodes of trigeminy for a total of 51 beats.  Frequent PACs no runs, but 3 pairs noted.  12 bigeminal episodes noted.  No arrhythmia.  Minimum heart rate 55 bpm, max 135 bpm. ->  Reportedly had 2 events while wearing monitor, but no obvious abnormality associated  . BLEPHAROPLASTY    . CARDIAC CATHETERIZATION  1992  . CARDIAC CATHETERIZATION N/A 03/31/2015   Procedure: Left Heart Cath and Coronary Angiography;  Surgeon: Marykay Lex, MD;  Location: [facility] INVASIVE CV LAB: LM 65%, mLAD 70%, 90% & 80%, o-pCx 75%, mCx 95% & d Cx 70%, OM1 60%, mRCA 90% with 100% dRCA. R-R collaterals --> CABG referral. Also noted to be in Afib.  . Carotid Dopplers  09/2017   Patent vertebrals. Mild bilateral carotid plaque, <50% stenosis bilaterally.  . CHOLECYSTECTOMY  2000   also had revision of hernia repair  . CORONARY ARTERY BYPASS GRAFT N/A 04/04/2015   Procedure: CORONARY ARTERY BYPASS GRAFTING (CABG);  Surgeon: [name], MD;  Location: [facility] OR;  Service: Open Heart Surgery;  Laterality: N/A;  . CYSTECTOMY  face and scrotum  . CYSTOSCOPY WITH INSERTION OF UROLIFT N/A 11/12/2016   Procedure: CYSTOSCOPY WITH INSERTION OF UROLIFT x4;  Surgeon: Jethro Bolusannenbaum, Sigmund, MD;  Location: [facility] ORS;  Service: Urology;  Laterality: N/A;  . CYSTOSCOPY WITH RETROGRADE PYELOGRAM, URETEROSCOPY AND STENT PLACEMENT Right 06/26/2016   Procedure: CYSTOSCOPY WITH RIGHT RETROGRADE PYELOGRAM, RIGHT URETEROSCOPY , HOLMIUM LASER, BASKET EXTRACTION AND STENT PLACEMENT;  Surgeon: Jethro BolusSigmund Tannenbaum, MD;  Location: [facility] ORS;  Service: Urology;  Laterality: Right;  . CYSTOSCOPY WITH RETROGRADE PYELOGRAM, URETEROSCOPY AND STENT PLACEMENT Left 09/23/2017   Procedure: CYSTOSCOPY WITH LEFT RETROGRADE PYELOGRAM, URETEROSCOPY STONE  EXTRACTION AND STENT PLACEMENT;  Surgeon: Crista ElliotBell, Eugene D III, MD;  Location: [facility] ORS;  Service: Urology;  Laterality: Left;  . LEXISCAN MYOVIEW  June 2010   converted to [facility] from treadmill due to inability to reach target heart rate) --the study was normal with no evidence of ischemia and normal LV function. EF 7  . LITHOTRIPSY    . TEE WITHOUT CARDIOVERSION N/A 04/04/2015   Procedure: TRANSESOPHAGEAL ECHOCARDIOGRAM (TEE);  Surgeon: Delight OvensEdward B Gerhardt, MD;  [facility];  Service: Open Heart Surgery;  Laterality: N/A;  . TRANSTHORACIC ECHOCARDIOGRAM  12/30/2016   EF 60-65%. GR 1 DD. Mildly dilated ascending aorta. Otherwise essentially normal with no valve lesions.  Marland Kitchen. VASECTOMY  1975  . VENTRAL HERNIA REPAIR  1994   complicated by infection   Social History   Socioeconomic History  . Marital status: Married    Spouse name: Not on file  . Number of children: 2  . Years of education: 3814  . Highest education level: Not on file  Occupational History  . Not on file  Social Needs  . Financial resource strain: Not hard at all  . Food insecurity:    Worry: Never true    Inability: Never true  . Transportation needs:    Medical: No    Non-medical: No  Tobacco Use  . Smoking status: Former Smoker    Packs/day: 2.00    Years: 40.00    Pack years: 80.00    Types: Cigarettes    Last attempt to quit: 10/17/1992    Years since quitting: 25.8  . Smokeless tobacco: Never Used  . Tobacco comment: quit 1994  Substance and Sexual Activity  . Alcohol use: Yes    Alcohol/week: 1.0 standard drinks    Types: 1 Standard drinks or equivalent per week    Comment: seldom - vodka or wine  . Drug use: No  . Sexual activity: Never  Lifestyle  . Physical activity:    Days per week: 5 days    Minutes per session: Not on file  . Stress: Not at all  Relationships  . Social connections:    Talks on phone: Not on file    Gets together: Not on file    Attends religious service: Not on file     Active member of club or organization: Not on file    Attends meetings of clubs or organizations: Not on file    Relationship status: Not on file  Other Topics Concern  . Not on file  Social History Narrative   [place]. No pets   Two children.      Former smoker. Quit in 1994. Prior to quitting was unable to walk without exertional dyspnea.      Work - Retired. 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  Hobbies - reading, drives Corvette, chuch   No Known Allergies Family History  Problem Relation Age of Onset  . Coronary artery disease Father   . Heart disease Father   . Stroke Mother   . Hearing loss Mother   . Stroke Sister   . Hearing loss Maternal Grandmother   . Prostate cancer Neg Hx   . Kidney disease Neg Hx   . Other Neg Hx        hypogonadism    Current Outpatient Medications (Endocrine & Metabolic):  .  metFORMIN (GLUCOPHAGE) 500 MG tablet, Take 1 tablet (500 mg total) by mouth 2 (two) times daily with a meal. .  predniSONE (STERAPRED UNI-PAK 21 TAB) 10 MG (21) TBPK tablet, Take according to pack instructions  Current Outpatient Medications (Cardiovascular):  .  atorvastatin (LIPITOR) 40 MG tablet, TAKE 1 TABLET (40 MG TOTAL) BY MOUTH DAILY. .  midodrine (PROAMATINE) 2.5 MG tablet, Take 2 tablets (5 mg total) by mouth 2 (two) times daily with a meal. .  NITROSTAT 0.4 MG SL tablet, PLACE 1 TABLET (0.4 MG TOTAL) UNDER THE TONGUE EVERY 5 (FIVE) MINUTES AS NEEDED FOR CHEST PAIN.    Current Outpatient Medications (Hematological):  .  clopidogrel (PLAVIX) 75 MG tablet, TAKE 1 TABLET EVERY DAY  Current Outpatient Medications (Other):  .  eszopiclone (LUNESTA) 1 MG TABS tablet, Take 1 tablet (1 mg total) by mouth at bedtime as needed for sleep. Take immediately before bedtime .  tamsulosin (FLOMAX) 0.4 MG CAPS capsule, Take 0.4 mg by mouth daily.  Marland Kitchen  triamcinolone cream (KENALOG) 0.1 %,  Apply 1 application topically 2 (two) times daily.    Past medical history, social, surgical and family history all reviewed in electronic medical record.  No pertanent information unless stated regarding to the chief complaint.   Review of Systems:  No headache, visual changes, nausea, vomiting, diarrhea, constipation, dizziness, abdominal pain, skin rash, fevers, chills, night sweats, weight loss, swollen lymph nodes, chest pain, shortness of breath, mood changes.  Positive muscle aches, body aches  Objective  Blood pressure 120/82, pulse 96, height 5\' 9"  (1.753 m), weight 206 lb (93.4 kg), SpO2 96 %.    General: No apparent distress alert and oriented x3 mood and affect normal, dressed appropriately.  HEENT: Pupils equal, extraocular movements intact  Respiratory: Patient's speak in full sentences and does not appear short of breath  Cardiovascular: Trace lower extremity edema, non tender, no erythema  Skin: Warm dry intact with no signs of infection or rash on extremities or on axial skeleton.  Abdomen: Soft nontender  Neuro: Cranial nerves II through XII are intact, neurovascularly intact in all extremities with 2+ DTRs and 2+ pulses.  Lymph: No lymphadenopathy of posterior or anterior cervical chain or axillae bilaterally.  Gait antalgic MSK:  tender with mild limited range of motion and good stability and symmetric strength and tone of shoulders, elbows, wrist, knee and ankles bilaterally.  Bilateral hip pain showed severe tenderness to palpation over the greater trochanteric area bilaterally.  Positive Faber test bilaterally.  Patient does have some tightness of the imagings bilaterally.  Neurovascular intact distally.  4+ out of 5 strength of the lower extremities bilaterally but symmetric deep tendon reflexes seem to be intact.  Low back pain does have severe loss of lordosis.   After verbal consent patient was prepped with alcohol swabs and with a 21-gauge 2 inch needle was  injected with 1 cc of 0.5% Marcaine and 1 cc of Kenalog  40 mg/mL into the right greater trochanteric area, no blood loss, Band-Aid placed.  Postinjection instructions given  After verbal consent patient was prepped with alcohol swabs and with a 21-gauge 2 inch needle was injected with 1 cc of 0.5% Marcaine and 1 cc of Kenalog 40 mg/mL into the left greater trochanteric area.  No blood loss.  Band-Aid placed.  Postinjection instructions given   Impression and Recommendations:     This case required medical decision making of moderate complexity. The above documentation has been reviewed and is accurate and complete Judi Saa, DO       Note: This dictation was prepared with Dragon dictation along with smaller phrase technology. Any transcriptional errors that result from this process are unintentional.

## 2018-08-11 NOTE — Patient Instructions (Addendum)
Good to see you  Ice is your friend If the injectios do not help in 2 weeks I will want a MRI of the back and call me so I can order it.  Consider pT as well for balance and coordination  We will hold on labs right now but I want to see you again in 3-4 weeks and if not better we are going to need to try some more things to make sure you are doing well

## 2018-08-11 NOTE — Assessment & Plan Note (Signed)
Repeat injection given today.  Still concern for possible lumbar radiculopathy.  Not taking gabapentin patient states that he did not like how it made him feel.  We discussed the advanced imaging would be acute due to claustrophobia patient wants to avoid MRI.  Would be more of a CT myelogram.  Patient could not tolerate it but is on the Plavix.  Patient could be a candidate for an epidural.  Patient wants to continue with conservative therapy at this time.  Follow-up again in 4 weeks

## 2018-08-11 NOTE — Assessment & Plan Note (Signed)
Patient given injection.  Discussed which activities to do discussed icing regimen, topical anti-inflammatories.  Once again concern for cervical radiculopathy but patient has declined advanced imaging.  Patient is complaining of some mild weakness of the legs and spinal stenosis is within the differential.  Patient will follow-up again in 4 weeks

## 2018-08-20 ENCOUNTER — Ambulatory Visit (INDEPENDENT_AMBULATORY_CARE_PROVIDER_SITE_OTHER): Payer: Medicare Other | Admitting: Family Medicine

## 2018-08-20 ENCOUNTER — Ambulatory Visit (INDEPENDENT_AMBULATORY_CARE_PROVIDER_SITE_OTHER): Payer: Medicare Other

## 2018-08-20 ENCOUNTER — Encounter: Payer: Self-pay | Admitting: Family Medicine

## 2018-08-20 VITALS — BP 104/62 | HR 93 | Temp 97.8°F | Ht 69.0 in | Wt 209.2 lb

## 2018-08-20 DIAGNOSIS — E119 Type 2 diabetes mellitus without complications: Secondary | ICD-10-CM | POA: Insufficient documentation

## 2018-08-20 DIAGNOSIS — F5104 Psychophysiologic insomnia: Secondary | ICD-10-CM

## 2018-08-20 DIAGNOSIS — H539 Unspecified visual disturbance: Secondary | ICD-10-CM

## 2018-08-20 DIAGNOSIS — R05 Cough: Secondary | ICD-10-CM

## 2018-08-20 DIAGNOSIS — R059 Cough, unspecified: Secondary | ICD-10-CM

## 2018-08-20 HISTORY — DX: Type 2 diabetes mellitus without complications: E11.9

## 2018-08-20 LAB — POCT GLYCOSYLATED HEMOGLOBIN (HGB A1C): Hemoglobin A1C: 6.6 % — AB (ref 4.0–5.6)

## 2018-08-20 MED ORDER — SCOPOLAMINE 1 MG/3DAYS TD PT72: 1.0000 | MEDICATED_PATCH | TRANSDERMAL | 0 refills | Status: DC

## 2018-08-20 NOTE — Assessment & Plan Note (Signed)
Check A1c. 

## 2018-08-20 NOTE — Patient Instructions (Addendum)
Nice to see you. We will get a chest x-ray today. We will check an A1c today.  Please keep your appointment with ophthalmology.

## 2018-08-20 NOTE — Assessment & Plan Note (Signed)
Patient notes this is improved with Lunesta.  He is tolerating this medication.  Advised to monitor for drowsiness.  He will avoid alcohol when using this medication.

## 2018-08-20 NOTE — Progress Notes (Signed)
Marc AlarEric Skiler Olden, MD Phone: [phone]  [name] is a 73 y.o. male who presents today for follow-up.  CC: Cough, insomnia, diabetes, vision difficulty  Cough: Patient notes this been going on for 2 weeks.  He has had some associated headache with sinus congestion.  The headache is right-sided.  He has been blowing clear mucus out of his nose.  Coughing up clear mucus.  No blood in his cough.  He does note feeling a sharp discomfort when he coughs in his right lung.  No postnasal drip.  Some chills.  His wife had similar symptoms that have improved.  The patient feels as though he is getting better  Insomnia: Patient has been on Lunesta.  He is taking this intermittently and does note it does help.  No drowsiness.  He does not drink alcohol with this.  Does rarely drink alcohol.  Diabetes: He is not checking sugars.  He is not on medication.  No polyuria or polydipsia.  Vision difficulty: Patient reports intermittently having issues with feeling as though his vision will get blurry.  Feels that the blurriness goes in and out.  He notes no issues currently.  He has an appointment with his eye doctor next week.  Social History   Tobacco Use  Smoking Status Former Smoker  . Packs/day: 2.00  . Years: 40.00  . Pack years: 80.00  . Types: Cigarettes  . Last attempt to quit: 10/17/1992  . Years since quitting: 25.8  Smokeless Tobacco Never Used  Tobacco Comment   quit 1994     ROS see history of present illness  Objective  Physical Exam Vitals:   08/20/18 1313  BP: 104/62  Pulse: 93  Temp: 97.8 F (36.6 C)  SpO2: 99%    BP Readings from Last 3 Encounters:  08/20/18 104/62  08/11/18 120/82  07/04/18 (!) 98/58   Wt Readings from Last 3 Encounters:  08/20/18 209 lb 3.2 oz (94.9 kg)  08/11/18 206 lb (93.4 kg)  07/04/18 209 lb 3.2 oz (94.9 kg)    Physical Exam Constitutional:      General: He is not in acute distress.    Appearance: He is not diaphoretic.  HENT:    Mouth/Throat:     Mouth: Mucous membranes are moist.     Pharynx: Oropharynx is clear.  Eyes:     Extraocular Movements: Extraocular movements intact.     Conjunctiva/sclera: Conjunctivae normal.     Pupils: Pupils are equal, round, and reactive to light.     Comments: Visual fields intact  Cardiovascular:     Rate and Rhythm: Normal rate and regular rhythm.     Heart sounds: Normal heart sounds.  Pulmonary:     Effort: Pulmonary effort is normal.     Breath sounds: Normal breath sounds.  Musculoskeletal:     Right lower leg: No edema.     Left lower leg: No edema.  Skin:    General: Skin is warm and dry.  Neurological:     Mental Status: He is alert.      Assessment/Plan: Please see individual problem list.  Diabetes mellitus without complication (HCC) Check A1c.  Insomnia Patient notes this is improved with Lunesta.  He is tolerating this medication.  Advised to monitor for drowsiness.  He will avoid alcohol when using this medication.  Cough I suspect this is related to a viral illness.  He has been progressively improving.  We will obtain a chest x-ray to evaluate for an underlying cause  given discomfort with coughing.  Vision changes No issues currently. He will see his eye doctor as planned.   Patient is going on a cruise.  He requested a scopolamine patch for seasickness.  These were sent to his pharmacy.  Patient reports he saw an orthopedist and they questioned how pale his hands were.  They wondered if he was anemic.  Recent CBC normal.  Patient's hands are normal-appearing today and warm.  Orders Placed This Encounter  Procedures  . DG Chest 2 View    Standing Status:   Future    Number of Occurrences:   1    Standing Expiration Date:   10/19/2019    Order Specific Question:   Reason for Exam (SYMPTOM  OR DIAGNOSIS REQUIRED)    Answer:   cough productive of clear mucus, 2 week duration, mild improvement    Order Specific Question:   Preferred imaging  location?    Answer:   [facility] Specific Question:   Radiology Contrast Protocol - do NOT remove file path    Answer:   [email or web address]  . POCT HgB A1C    Meds ordered this encounter  Medications  . scopolamine (TRANSDERM-SCOP, 1.5 MG,) 1 MG/3DAYS    Sig: Place 1 patch (1.5 mg total) onto the skin every 3 (three) days.    Dispense:  10 patch    Refill:  0     Marc Alar, MD [facility]

## 2018-08-20 NOTE — Assessment & Plan Note (Signed)
No issues currently. He will see his eye doctor as planned.

## 2018-08-20 NOTE — Assessment & Plan Note (Signed)
I suspect this is related to a viral illness.  He has been progressively improving.  We will obtain a chest x-ray to evaluate for an underlying cause given discomfort with coughing.

## 2018-08-26 ENCOUNTER — Encounter: Payer: Self-pay | Admitting: Family Medicine

## 2018-08-29 DIAGNOSIS — H2513 Age-related nuclear cataract, bilateral: Secondary | ICD-10-CM | POA: Diagnosis not present

## 2018-09-01 LAB — HM DIABETES EYE EXAM

## 2018-09-02 ENCOUNTER — Other Ambulatory Visit: Payer: Self-pay | Admitting: Cardiology

## 2018-09-02 DIAGNOSIS — I951 Orthostatic hypotension: Secondary | ICD-10-CM

## 2018-09-03 NOTE — Progress Notes (Signed)
[name] [address] [place], [place] [address] Phone: [phone] Subjective:    I [name] am serving as a Neurosurgeon for Dr. Antoine [name].   I'm seeing this patient by the request  of:    CC: Back pain and leg weakness  HWE:[id number]  [name] is a 73 y.o. male coming in with complaint of hip pain. Patient states his hips are painful today.  Patient feels like he has not made any significant improvement.  Patient's past medical history is significant for aortic stenosis and has had poor blood flow to the lower extremities.  X-rays of patient's back have shown degenerative disc disease at multiple levels.  Patient has tried the vitamins, medications including gabapentin that patient did not feel was making a difference.  Patient is feels still like he is worsening over the course of time.  Affecting daily activities, even painful at night.  Still more of a weakness in the legs I feel like almost that it would give out on him.      Past Medical History:  Diagnosis Date  . Arthritis   . CAD, multiple vessel 03/2105   LM 65%, mLAD 70%, 90% & 80%, o-pCx 75%, mCx 95% & d Cx 70%, OM1 60%, mRCA 90% with 100% dRCA. R-R collaterals  . Chronic distal aortic occlusion (HCC)    By report  . Chronic kidney disease   . COPD (chronic obstructive pulmonary disease) (HCC)   . Depression   . Diabetes mellitus without complication (HCC) 08/20/2018  . Enlarged prostate   . Erectile dysfunction   . Essential hypertension   . History of kidney stones    multiple lithotripsies  . Hyperlipidemia LDL goal <70   . Hypogonadism in male    Previously on testosterone injections, but has not yet restarted until further testing  . Muscle pain   . PAF (paroxysmal atrial fibrillation) (HCC) 03/2015   Noted per-cath & post-op CABG.  Dr. [name], cardiology  . Peripheral vascular disease (HCC)    Bilateral ABIs 0.8  . Renal stones 03/2016  . S/P CABG x 5 04/04/2015   LIMA to LAD, SVG to DIAG, SVG to OM, seqSVG -PDA- dCx  . SOB (shortness of breath)   . 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 Weakness    weakness bilateral legs, s/p vein harvesting for CABPG   Past Surgical History:  Procedure Laterality Date  . 48-HOUR HOLTER MONITOR  02/2018   Sinus rhythm with some sinus bradycardia and sinus tachycardia.  Rare PVCs.  3 episodes of trigeminy for a total of 51 beats.  Frequent PACs no runs, but 3 pairs noted.  12 bigeminal episodes noted.  No arrhythmia.  Minimum heart rate 55 bpm, max 135 bpm. ->  Reportedly had 2 events while wearing monitor, but no obvious abnormality associated  . BLEPHAROPLASTY    . CARDIAC CATHETERIZATION  1992  . CARDIAC CATHETERIZATION N/A 03/31/2015   Procedure: Left Heart Cath and Coronary Angiography;  Surgeon: Marykay Lex, MD;  Location: [facility] INVASIVE CV LAB: LM 65%, mLAD 70%, 90% & 80%, o-pCx 75%, mCx 95% & d Cx 70%, OM1 60%, mRCA 90% with 100% dRCA. R-R collaterals --> CABG referral. Also noted to be in Afib.  . Carotid Dopplers  09/2017   Patent vertebrals. Mild bilateral carotid plaque, <50% stenosis bilaterally.  . CHOLECYSTECTOMY  2000   also had revision of hernia repair  .  CORONARY ARTERY BYPASS GRAFT N/A 04/04/2015   Procedure: CORONARY ARTERY BYPASS GRAFTING (CABG);  Surgeon: Delight OvensEdward B Gerhardt, MD;  [facility];  Service: Open Heart Surgery;  Laterality: N/A;  . CYSTECTOMY     face and scrotum  . CYSTOSCOPY WITH INSERTION OF UROLIFT N/A 11/12/2016   Procedure: CYSTOSCOPY WITH INSERTION OF UROLIFT x4;  Surgeon: Jethro Bolusannenbaum, Sigmund, MD;  Location: [facility] ORS;  Service: Urology;  Laterality: N/A;  . CYSTOSCOPY WITH RETROGRADE PYELOGRAM, URETEROSCOPY AND STENT PLACEMENT Right 06/26/2016   Procedure: CYSTOSCOPY WITH RIGHT RETROGRADE PYELOGRAM, RIGHT URETEROSCOPY , HOLMIUM LASER, BASKET EXTRACTION AND STENT PLACEMENT;  Surgeon: Jethro BolusSigmund Tannenbaum, MD;  Location: [facility] ORS;  Service:  Urology;  Laterality: Right;  . CYSTOSCOPY WITH RETROGRADE PYELOGRAM, URETEROSCOPY AND STENT PLACEMENT Left 09/23/2017   Procedure: CYSTOSCOPY WITH LEFT RETROGRADE PYELOGRAM, URETEROSCOPY STONE EXTRACTION AND STENT PLACEMENT;  Surgeon: Crista ElliotBell, Eugene D III, MD;  Location: [facility] ORS;  Service: Urology;  Laterality: Left;  . LEXISCAN MYOVIEW  June 2010   [facility] due to inability to reach target heart rate) --the study was normal with no evidence of ischemia and normal LV function. EF 7  . LITHOTRIPSY    . TEE WITHOUT CARDIOVERSION N/A 04/04/2015   Procedure: TRANSESOPHAGEAL ECHOCARDIOGRAM (TEE);  Surgeon: Delight OvensEdward B Gerhardt, MD;  [facility];  Service: Open Heart Surgery;  Laterality: N/A;  . TRANSTHORACIC ECHOCARDIOGRAM  12/30/2016   EF 60-65%. GR 1 DD. Mildly dilated ascending aorta. Otherwise essentially normal with no valve lesions.  Marland Kitchen. VASECTOMY  1975  . VENTRAL HERNIA REPAIR  1994   complicated by infection   Social History   Socioeconomic History  . Marital status: Married    Spouse name: Not on file  . Number of children: 2  . Years of education: 3614  . Highest education level: Not on file  Occupational History  . Not on file  Social Needs  . Financial resource strain: Not hard at all  . Food insecurity:    Worry: Never true    Inability: Never true  . Transportation needs:    Medical: No    Non-medical: No  Tobacco Use  . Smoking status: Former Smoker    Packs/day: 2.00    Years: 40.00    Pack years: 80.00    Types: Cigarettes    Last attempt to quit: 10/17/1992    Years since quitting: 25.8  . Smokeless tobacco: Never Used  . Tobacco comment: quit 1994  Substance and Sexual Activity  . Alcohol use: Yes    Alcohol/week: 1.0 standard drinks    Types: 1 Standard drinks or equivalent per week    Comment: seldom - vodka or wine  . Drug use: No  . Sexual activity: Never  Lifestyle  . Physical activity:    Days per week: 5 days    Minutes per  session: Not on file  . Stress: Not at all  Relationships  . Social connections:    Talks on phone: Not on file    Gets together: Not on file    Attends religious service: Not on file    Active member of club or organization: Not on file    Attends meetings of clubs or organizations: Not on file    Relationship status: Not on file  Other Topics Concern  . Not on file  Social History Narrative   [place] in [place]. No pets   Two children.      Former smoker.  Quit in 1994. Prior to quitting was unable to walk without exertional dyspnea.      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      Hobbies - reading, drives Corvette, chuch   No Known Allergies Family History  Problem Relation Age of Onset  . Coronary artery disease Father   . Heart disease Father   . Stroke Mother   . Hearing loss Mother   . Stroke Sister   . Hearing loss Maternal Grandmother   . Prostate cancer Neg Hx   . Kidney disease Neg Hx   . Other Neg Hx        hypogonadism     Current Outpatient Medications (Cardiovascular):  .  atorvastatin (LIPITOR) 40 MG tablet, TAKE 1 TABLET (40 MG TOTAL) BY MOUTH DAILY. .  midodrine (PROAMATINE) 2.5 MG tablet, Take 2 tablets (5 mg total) by mouth 2 (two) times daily with a meal. .  NITROSTAT 0.4 MG SL tablet, PLACE 1 TABLET (0.4 MG TOTAL) UNDER THE TONGUE EVERY 5 (FIVE) MINUTES AS NEEDED FOR CHEST PAIN.    Current Outpatient Medications (Hematological):  .  clopidogrel (PLAVIX) 75 MG tablet, TAKE 1 TABLET EVERY DAY .  cilostazol (PLETAL) 50 MG tablet, Take 0.5 tablets (25 mg total) by mouth 2 (two) times daily.  Current Outpatient Medications (Other):  .  eszopiclone (LUNESTA) 1 MG TABS tablet, Take 1 tablet (1 mg total) by mouth at bedtime as needed for sleep. Take immediately before bedtime .  scopolamine (TRANSDERM-SCOP, 1.5 MG,) 1 MG/3DAYS, Place 1 patch (1.5 mg total) onto the  skin every 3 (three) days. .  tamsulosin (FLOMAX) 0.4 MG CAPS capsule, Take 0.4 mg by mouth daily.  Marland Kitchen  triamcinolone cream (KENALOG) 0.1 %, Apply 1 application topically 2 (two) times daily.    Past medical history, social, surgical and family history all reviewed in electronic medical record.  No pertanent information unless stated regarding to the chief complaint.   Review of Systems:  No headache, visual changes, nausea, vomiting, diarrhea, constipation, dizziness, abdominal pain, skin rash, fevers, chills, night sweats, weight loss, swollen lymph nodes,  chest pain, shortness of breath, mood changes.  Positive muscle aches, body aches, joint swelling is increasing fatigue  Objective  Blood pressure 100/60, pulse (!) 105, height 5\' 9"  (1.753 m), weight 210 lb (95.3 kg), SpO2 93 %.   General: No apparent distress alert and oriented x3 mood and affect normal, dressed appropriately.  HEENT: Pupils equal, extraocular movements intact  Respiratory: Patient's speak in full sentences and does not appear short of breath  Cardiovascular: No lower extremity edema, non tender, no erythema  Skin: Warm dry intact with no signs of infection or rash on extremities or on axial skeleton.  Abdomen: Soft nontender  Neuro: Cranial nerves II through XII are intact, neurovascularly intact in all extremities with 2+ DTRs and + distal pulses of the dorsalis pedis bilaterally Lymph: No lymphadenopathy of posterior or anterior cervical chain or axillae bilaterally.  Gait shuffling gait MSK: Arthritic changes of multiple joints  Back exam does have loss of lordosis.  Lower extremities show that patient does have some mild atrophy of the thighs bilaterally.  Patient appears to be neurovascular intact with known peripheral neuropathy noted.  4+ out of 5 strength in lower extremities bilaterally and deep tendon reflexes appear to be intact.  Once again though somewhat poor peripheral pulses noted.   Impression and  Recommendations:     This case required medical decision making of moderate complexity. The above documentation has been reviewed and is accurate and complete Lyndal Pulley, DO       Note: This dictation was prepared with Dragon dictation along with smaller phrase technology. Any transcriptional errors that result from this process are unintentional.

## 2018-09-03 NOTE — Telephone Encounter (Signed)
Rx(s) sent to pharmacy electronically.  

## 2018-09-04 ENCOUNTER — Encounter: Payer: Self-pay | Admitting: Family Medicine

## 2018-09-04 ENCOUNTER — Ambulatory Visit (INDEPENDENT_AMBULATORY_CARE_PROVIDER_SITE_OTHER): Payer: Medicare Other | Admitting: Family Medicine

## 2018-09-04 ENCOUNTER — Other Ambulatory Visit (INDEPENDENT_AMBULATORY_CARE_PROVIDER_SITE_OTHER): Payer: Medicare Other

## 2018-09-04 ENCOUNTER — Telehealth: Payer: Self-pay | Admitting: Cardiology

## 2018-09-04 VITALS — BP 100/60 | HR 105 | Ht 69.0 in | Wt 210.0 lb

## 2018-09-04 DIAGNOSIS — R739 Hyperglycemia, unspecified: Secondary | ICD-10-CM

## 2018-09-04 DIAGNOSIS — M5416 Radiculopathy, lumbar region: Secondary | ICD-10-CM | POA: Diagnosis not present

## 2018-09-04 DIAGNOSIS — M2550 Pain in unspecified joint: Secondary | ICD-10-CM

## 2018-09-04 DIAGNOSIS — M545 Low back pain, unspecified: Secondary | ICD-10-CM

## 2018-09-04 DIAGNOSIS — I951 Orthostatic hypotension: Secondary | ICD-10-CM

## 2018-09-04 DIAGNOSIS — E038 Other specified hypothyroidism: Secondary | ICD-10-CM

## 2018-09-04 DIAGNOSIS — G8929 Other chronic pain: Secondary | ICD-10-CM

## 2018-09-04 LAB — URIC ACID: Uric Acid, Serum: 5.1 mg/dL (ref 4.0–7.8)

## 2018-09-04 LAB — TSH: TSH: 1.87 u[IU]/mL (ref 0.35–4.50)

## 2018-09-04 LAB — HEMOGLOBIN A1C: Hgb A1c MFr Bld: 6.7 % — ABNORMAL HIGH (ref 4.6–6.5)

## 2018-09-04 LAB — SEDIMENTATION RATE: Sed Rate: 12 mm/hr (ref 0–20)

## 2018-09-04 MED ORDER — MIDODRINE HCL 5 MG PO TABS: 5.0000 mg | ORAL_TABLET | Freq: Two times a day (BID) | ORAL | 1 refills | Status: DC

## 2018-09-04 MED ORDER — CILOSTAZOL 50 MG PO TABS: 25.0000 mg | ORAL_TABLET | Freq: Two times a day (BID) | ORAL | 1 refills | Status: DC

## 2018-09-04 NOTE — Patient Instructions (Signed)
Good to see you  [name] is your friend Please get the labs downstairs just to rule out everything else.  Pletal 1/2 tab 2 times daily to help the blood flow in the legs  I do want a CT myelogram of the back and we will get it done soon but will need to be off the plavix for 5 days. You can call and speed up the process at [phone]  After the labs and the CT scan we will discuss over my chart and discuss next options to get you feeling better

## 2018-09-04 NOTE — Telephone Encounter (Signed)
Spoke with pt Pt made aware of Kristin A, Pharm-D recommendation below. Pt sts that he currently take Midodrine (2) 2.5mg  tabs bid. If he could get an Rx for 5mg  bid the cost for him would be 1/2 the price. Rx sent to the pt mail order for Midodrine 5mg  bid #180 R-1. Pt verbalizes understanding and voiced appreciation for the assistance.

## 2018-09-04 NOTE — Assessment & Plan Note (Signed)
I believe the patient's back pain and leg pain is secondary to more of a lumbar radiculopathy.  Due to claustrophobia patient is unable to tolerate a MRI and we will do a CT myelogram.  Patient will hold Plavix for 5 days before the testing.  In addition to this we will get other labs which patient has not gotten for some time.  We discussed the possibility of a protein electrophoresis as well as some other labs that have been ordered previously but patient has never went to have them drawn.  Patient given Pletal to help with any type of vascularity that could be contributing to some of the pain.  Follow-up with me again after imaging to discuss possible treatment options such as epidural steroid injections

## 2018-09-04 NOTE — Telephone Encounter (Signed)
Message routed to Dr.Harding and the Pharmacist to advise.

## 2018-09-04 NOTE — Telephone Encounter (Signed)
Both medications have antiplatelet properties, but can be used together if needed.  Please advise patient to watch for increased bleeding/brusing.

## 2018-09-04 NOTE — Telephone Encounter (Signed)
New Message          Patient is calling today about "Cilostazol", Dr Antoine Primas is asking him to take this medication for the blood flow in his legs, patient would like to know if this is ok to take? Pls call to advise.

## 2018-09-05 ENCOUNTER — Telehealth: Payer: Self-pay | Admitting: Family Medicine

## 2018-09-05 ENCOUNTER — Telehealth: Payer: Self-pay

## 2018-09-05 NOTE — Telephone Encounter (Signed)
I agree with Kristin's recommendations.  I am not really sure how well cilostazol will help since his claudication is based on his aortic stenosis, but it may help.  Bryan Lemma, MD

## 2018-09-05 NOTE — Telephone Encounter (Signed)
I am assuming he would like this from Dr Katrinka Blazing? PCP is Dr Birdie Sons.

## 2018-09-05 NOTE — Telephone Encounter (Signed)
Called and spoke with patient. Pt stated that he is scheduled for a cruise on September 28, 2018 and will not be able to go due to health issues. Pt stated that in order to get a refund he will need a medical letter ASAP to send to get the refund.   Pt stated that here recently he has had to see many different doctors and and deals with dizziness problems and hear lately he has felt very dizzy.

## 2018-09-05 NOTE — Telephone Encounter (Signed)
Copied from CRM [id number]. Topic: Quick Communication - See Telephone Encounter >> Sep 05, 2018 12:46 PM Fanny Bien wrote: CRM for notification. See Telephone encounter for: 09/05/18. Pt called and stated that he is booked to go on a cruse and would like to know if he can get a note that says the patient should not go on the cruse. Pt states that he is having a hard time walking. Pt will be responsible for the amount of the cruse unless he receives a letter. Pt scheduled cruse for 09/28/18. Please advise

## 2018-09-05 NOTE — Telephone Encounter (Signed)
Copied from CRM [id number]. Topic: Appointment Scheduling - Scheduling Inquiry for Clinic >> Sep 05, 2018 12:39 PM Laural Benes, [place] C wrote: Reason for CRM: pt called in to schedule ov. Pt says that he has a few concerns that he has been speaking with PCP about due to travel. Pt would like a call back from office to discuss further and to seek a sooner apt date/time with PCP if possible.   CB: [phone]

## 2018-09-08 ENCOUNTER — Telehealth: Payer: Self-pay

## 2018-09-08 NOTE — Telephone Encounter (Signed)
Pt called back in to follow up on request. Pt would like to have a call back from provider Katrinka Blazing or his CMA at: [phone] in regards to the below.

## 2018-09-08 NOTE — Telephone Encounter (Deleted)
Pt called back in to follow up on request. Pt would like to have a call back from provider Smith or his CMA at: [phone] in regards to the below.  °

## 2018-09-08 NOTE — Telephone Encounter (Signed)
Spoke with patient about stopping medication. Patient voices understanding.

## 2018-09-08 NOTE — Telephone Encounter (Addendum)
I am happy to write him a letter though it may be worthwhile having him follow-up particularly if his dizziness issues have become more prevalent recently.  Please see if we can schedule him for a visit to follow-up and then send back to me to write a letter.

## 2018-09-08 NOTE — Telephone Encounter (Signed)
Spoke with patient to screen his medications and drug allergies prior to him being scheduled for a lumbar myelogram.  He understands he will be here 2-2.5 hours, will need a driver and will need to be on bedrest for 24 hours after the procedure.  I informed him I will fax thinner clearances to Dr. [name] (Plavix) and Dr. Antoine Primas (Pletal).  He understands not to stop these meds until clearance is obtained and we get him scheduled. Larina Earthly, RN

## 2018-09-08 NOTE — Telephone Encounter (Signed)
Spoke with patient regarding letter. He also wanted to note that since starting to take Pletal that he has developed severe headaches. He has been using Tylenol Arthritis for the headaches. He also has had an increase in his dizzy spells since starting Pletal. Does see his cardiologist tomorrow but wanted to make our office aware. Please advise.

## 2018-09-08 NOTE — Telephone Encounter (Signed)
Does patient need an appointment for letter for cruise?

## 2018-09-08 NOTE — Telephone Encounter (Signed)
Likely stop the pletal.  Will be difficult to treat

## 2018-09-09 ENCOUNTER — Ambulatory Visit (INDEPENDENT_AMBULATORY_CARE_PROVIDER_SITE_OTHER): Payer: Medicare Other | Admitting: Cardiology

## 2018-09-09 ENCOUNTER — Telehealth: Payer: Self-pay

## 2018-09-09 ENCOUNTER — Encounter: Payer: Self-pay | Admitting: Cardiology

## 2018-09-09 VITALS — BP 116/64 | HR 88 | Ht 69.0 in | Wt 208.0 lb

## 2018-09-09 DIAGNOSIS — E785 Hyperlipidemia, unspecified: Secondary | ICD-10-CM

## 2018-09-09 DIAGNOSIS — I208 Other forms of angina pectoris: Secondary | ICD-10-CM | POA: Diagnosis not present

## 2018-09-09 DIAGNOSIS — I25118 Atherosclerotic heart disease of native coronary artery with other forms of angina pectoris: Secondary | ICD-10-CM

## 2018-09-09 DIAGNOSIS — I480 Paroxysmal atrial fibrillation: Secondary | ICD-10-CM | POA: Diagnosis not present

## 2018-09-09 DIAGNOSIS — I10 Essential (primary) hypertension: Secondary | ICD-10-CM

## 2018-09-09 DIAGNOSIS — R42 Dizziness and giddiness: Secondary | ICD-10-CM

## 2018-09-09 DIAGNOSIS — R29898 Other symptoms and signs involving the musculoskeletal system: Secondary | ICD-10-CM

## 2018-09-09 DIAGNOSIS — I951 Orthostatic hypotension: Secondary | ICD-10-CM | POA: Diagnosis not present

## 2018-09-09 DIAGNOSIS — I2089 Other forms of angina pectoris: Secondary | ICD-10-CM

## 2018-09-09 LAB — CALCIUM, IONIZED: Calcium, Ion: 4.85 mg/dL (ref 4.8–5.6)

## 2018-09-09 LAB — PROTEIN ELECTROPHORESIS, SERUM, WITH REFLEX
Albumin ELP: 3.5 g/dL — ABNORMAL LOW (ref 3.8–4.8)
Alpha 1: 0.3 g/dL (ref 0.2–0.3)
Alpha 2: 0.7 g/dL (ref 0.5–0.9)
Beta 2: 0.3 g/dL (ref 0.2–0.5)
Beta Globulin: 0.4 g/dL (ref 0.4–0.6)
Gamma Globulin: 0.8 g/dL (ref 0.8–1.7)
Total Protein: 6 g/dL — ABNORMAL LOW (ref 6.1–8.1)

## 2018-09-09 LAB — PTH, INTACT AND CALCIUM
Calcium: 8.6 mg/dL (ref 8.6–10.3)
PTH: 16 pg/mL (ref 14–64)

## 2018-09-09 MED ORDER — ISOSORBIDE MONONITRATE ER 30 MG PO TB24: 30.0000 mg | ORAL_TABLET | Freq: Every day | ORAL | 3 refills | Status: DC

## 2018-09-09 NOTE — Progress Notes (Signed)
PCP: Glori Luis, MD  Clinic Note: Chief Complaint  Patient presents with  . Follow-up    4 months.  . Headache  . Shortness of Breath  . Edema    Ankles.    HPI: Rosio [name] is a 73 y.o. male with a PMH below who presents today for sooner that planned f/u for low BP & dizzy spells. He has a history of CAD noticed for progressive angina (described as exertional chest burning and tightness associate with left arm discomfort --this was worse with activity, especially walking up incline.  Usually relieved with rest.  Lasted less than 1 minute most the time.  No recurrence at rest.).  And referred for CABG for multivessel disease. He also has long-standing known occlusion of the distal aorta (Leriche Syndrome).  He had atrial fibrillation in the setting of his progressive angina and pre-/peri-CABG timeframe --as of last evaluation, he has not had any breakthrough A. fib episodes.  [name] was last seen in Oct 2019  Recent Hospitalizations: None  Studies Personally Reviewed - (if available, images/films reviewed: From Epic Chart or Care Everywhere)  Bilateral ABIs September 6 -> abnormal bilateral TBI but with normal resting ABIs bilaterally.  Myoview 05/2018: LOW RISK EF 45 to 54%.  Small fixed mild severity defect in the apex which could present either subendocardial infarct versus artifact.  No evidence of ischemia.  Interval History: [name] returns here today for follow-up evaluation still noting that he is having these really strange spells where he feels like he just cannot move or do anything.  He basically feels almost like he is frozen.  He has so much pain in his legs and back associated with it.  He then also will get the splitting headaches going up the back of his head.  He also has had occasions were associated with the symptoms he also feels discomfort in his chest.  Right relatively similar to what he was feeling when we did his stress test last year.  He has  had episodes of low and high blood pressure.  There was concern for possible claudication related symptoms and so he was started on Pletal.  He did not tolerate that at all.  Stopped it within a couple days.  His daughter-in-law got him the [place] application and monitor strip which he has been using off and on for the last month.  He had 2 episodes where he had some tachycardia read as "possible atrial fib ".  I personally reviewed these strips on his phone, they appear to be runs of PAT but not necessarily PAF 1 with a rate of 140 bpm and the other 104 bpm.  He did not feel necessarily lightheaded and dizzy with these episodes, but this is the times he felt them.  To describe 1 of his episodes: He says he was walking into the store shortly after getting in he basically frozen he could not move he had pain up the back of his neck felt dizzy headache some discomfort in his chest and then weakness in his legs.  It lasted a few seconds and then he was back to normal again as far as being on a walk, but still felt quite weak.  Very similar to how I described last time: "He describes a sensation of being the "hot rush coming up the back of his neck to his head like a rush" he then feels lightheaded and occasionally nauseated with some blurry vision.  He even has  some point having some difficulty with speech."  He never did follow through with a referral to [facility] neurology, but now is interested in doing so.  His sports medicine physician is ordering myelogram injection which would require him holding his Plavix.  Other cardiovascular review of symptoms: No real PND orthopnea with mild edema.  He has had some near syncopal type symptoms but no syncope.  No amaurosis fugax symptoms but these fleeting episodes seem to be almost TIA type. No bleeding issues such as melena, hematochezia or hematuria.  ROS: A comprehensive was performed.  Pertinent symptoms noted in HPI Review of Systems  Constitutional:  Positive for malaise/fatigue. Negative for weight loss (wgt gain).  HENT: Negative for nosebleeds.   Eyes: Positive for blurred vision and double vision.       Left eye seems to be less focused.  May need a new prescription.  He says his depth perception is off  Cardiovascular: Positive for claudication.  Gastrointestinal: Positive for heartburn. Negative for abdominal pain, blood in stool and melena.  Genitourinary: Negative for dysuria and hematuria.  Musculoskeletal: Positive for falls (see HPI -- tripped) and joint pain.  Neurological: Positive for dizziness (Lightheadedness.) and weakness (during these spells).       Unsteady gait -- concerned about depth perception  Psychiatric/Behavioral: Negative for depression. The patient is nervous/anxious (becuase of these Sx.).   All other systems reviewed and are negative.  I have reviewed and (if needed) personally updated the patient's problem list, medications, allergies, past medical and surgical history, social and family history.   Past Medical History:  Diagnosis Date  . Arthritis   . CAD, multiple vessel 03/2105   LM 65%, mLAD 70%, 90% & 80%, o-pCx 75%, mCx 95% & d Cx 70%, OM1 60%, mRCA 90% with 100% dRCA. R-R collaterals  . Chronic distal aortic occlusion (HCC)    By report  . Chronic kidney disease   . COPD (chronic obstructive pulmonary disease) (HCC)   . Depression   . Diabetes mellitus without complication (HCC) 08/20/2018  . Enlarged prostate   . Erectile dysfunction   . Essential hypertension   . History of kidney stones    multiple lithotripsies  . Hyperlipidemia LDL goal <70   . Hypogonadism in male    Previously on testosterone injections, but has not yet restarted until further testing  . Muscle pain   . PAF (paroxysmal atrial fibrillation) (HCC) 03/2015   Noted per-cath & post-op CABG.  Dr. [name], cardiology  . Peripheral vascular disease (HCC)    Bilateral ABIs 0.8  . Renal stones 03/2016  . S/P CABG x 5  04/04/2015   LIMA to LAD, SVG to DIAG, SVG to OM, seqSVG -PDA- dCx  . SOB (shortness of breath)   . 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 Weakness    weakness bilateral legs, s/p vein harvesting for CABPG    Past Surgical History:  Procedure Laterality Date  . 48-HOUR HOLTER MONITOR  02/2018   Sinus rhythm with some sinus bradycardia and sinus tachycardia.  Rare PVCs.  3 episodes of trigeminy for a total of 51 beats.  Frequent PACs no runs, but 3 pairs noted.  12 bigeminal episodes noted.  No arrhythmia.  Minimum heart rate 55 bpm, max 135 bpm. ->  Reportedly had 2 events while wearing monitor, but no obvious abnormality associated  . BLEPHAROPLASTY    . CARDIAC CATHETERIZATION  1992  . CARDIAC CATHETERIZATION  N/A 03/31/2015   Procedure: Left Heart Cath and Coronary Angiography;  Surgeon: Marykay Lex, MD;  Location: [facility] INVASIVE CV LAB: LM 65%, mLAD 70%, 90% & 80%, o-pCx 75%, mCx 95% & d Cx 70%, OM1 60%, mRCA 90% with 100% dRCA. R-R collaterals --> CABG referral. Also noted to be in Afib.  . Carotid Dopplers  09/2017   Patent vertebrals. Mild bilateral carotid plaque, <50% stenosis bilaterally.  . CHOLECYSTECTOMY  2000   also had revision of hernia repair  . CORONARY ARTERY BYPASS GRAFT N/A 04/04/2015   Procedure: CORONARY ARTERY BYPASS GRAFTING (CABG);  Surgeon: [name], MD;  Location: [facility] OR;  Service: Open Heart Surgery;  Laterality: N/A;  . CYSTECTOMY     face and scrotum  . CYSTOSCOPY WITH INSERTION OF UROLIFT N/A 11/12/2016   Procedure: CYSTOSCOPY WITH INSERTION OF UROLIFT x4;  Surgeon: Jethro Bolus, MD;  Location: [facility] ORS;  Service: Urology;  Laterality: N/A;  . CYSTOSCOPY WITH RETROGRADE PYELOGRAM, URETEROSCOPY AND STENT PLACEMENT Right 06/26/2016   Procedure: CYSTOSCOPY WITH RIGHT RETROGRADE PYELOGRAM, RIGHT URETEROSCOPY , HOLMIUM LASER, BASKET EXTRACTION AND STENT PLACEMENT;  Surgeon: Jethro Bolus, MD;  Location: [facility]  ORS;  Service: Urology;  Laterality: Right;  . CYSTOSCOPY WITH RETROGRADE PYELOGRAM, URETEROSCOPY AND STENT PLACEMENT Left 09/23/2017   Procedure: CYSTOSCOPY WITH LEFT RETROGRADE PYELOGRAM, URETEROSCOPY STONE EXTRACTION AND STENT PLACEMENT;  Surgeon: Crista Elliot, MD;  Location: [facility] ORS;  Service: Urology;  Laterality: Left;  . LEXISCAN MYOVIEW  June 2010   converted to [facility] from treadmill due to inability to reach target heart rate) --the study was normal with no evidence of ischemia and normal LV function. EF 7  . LITHOTRIPSY    . NM MYOVIEW LTD  05/2018   LOW RISK EF 45 to 54%.  Small fixed mild severity defect in the apex which could present either subendocardial infarct versus artifact.  No evidence of ischemia.  . TEE WITHOUT CARDIOVERSION N/A 04/04/2015   Procedure: TRANSESOPHAGEAL ECHOCARDIOGRAM (TEE);  Surgeon: [name], MD;  Location: [facility] OR;  Service: Open Heart Surgery;  Laterality: N/A;  . TRANSTHORACIC ECHOCARDIOGRAM  12/30/2016   EF 60-65%. GR 1 DD. Mildly dilated ascending aorta. Otherwise essentially normal with no valve lesions.  Marland Kitchen VASECTOMY  1975  . VENTRAL HERNIA REPAIR  1994   complicated by infection    Current Meds  Medication Sig  . acetaminophen (TYLENOL) 650 MG CR tablet Take 650 mg by mouth every 8 (eight) hours as needed for pain.  Marland Kitchen clopidogrel (PLAVIX) 75 MG tablet TAKE 1 TABLET EVERY DAY  . eszopiclone (LUNESTA) 1 MG TABS tablet Take 1 tablet (1 mg total) by mouth at bedtime as needed for sleep. Take immediately before bedtime  . midodrine (PROAMATINE) 5 MG tablet Take 1 tablet (5 mg total) by mouth 2 (two) times daily with a meal.  . NITROSTAT 0.4 MG SL tablet PLACE 1 TABLET (0.4 MG TOTAL) UNDER THE TONGUE EVERY 5 (FIVE) MINUTES AS NEEDED FOR CHEST PAIN.  Marland Kitchen scopolamine (TRANSDERM-SCOP, 1.5 MG,) 1 MG/3DAYS Place 1 patch (1.5 mg total) onto the skin every 3 (three) days.  . tamsulosin (FLOMAX) 0.4 MG CAPS capsule Take 0.4 mg by mouth daily.   .  [DISCONTINUED] atorvastatin (LIPITOR) 40 MG tablet TAKE 1 TABLET (40 MG TOTAL) BY MOUTH DAILY.    No Known Allergies  Social History   Tobacco Use  . Smoking status: Former Smoker    Packs/day: 2.00    Years: 40.00  Pack years: 80.00    Types: Cigarettes    Last attempt to quit: 10/17/1992    Years since quitting: 25.9  . Smokeless tobacco: Never Used  . Tobacco comment: quit 1994  Substance Use Topics  . Alcohol use: Yes    Alcohol/week: 1.0 standard drinks    Types: 1 Standard drinks or equivalent per week    Comment: seldom - vodka or wine  . Drug use: No   Social History   Social History Narrative   Lives in [place] with wife. No pets   Two children.      Former smoker. Quit in 1994. Prior to quitting was unable to walk without exertional dyspnea.      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      Hobbies - reading, drives Corvette, chuch    family history includes Coronary artery disease in his father; Hearing loss in his maternal grandmother and mother; Heart disease in his father; Stroke in his mother and sister.  Wt Readings from Last 3 Encounters:  09/09/18 208 lb (94.3 kg)  09/04/18 210 lb (95.3 kg)  08/20/18 209 lb 3.2 oz (94.9 kg)    PHYSICAL EXAM BP 116/64 (BP Location: Left Arm, Patient Position: Sitting, Cuff Size: Normal)   Pulse 88   Ht  (1.753 m)   Wt 208 lb (94.3 kg)   BMI 30.72 kg/m  Physical Exam  Constitutional: He is oriented to person, place, and time. He appears well-developed and well-nourished. No distress.  He appears older than his stated age. Appears to be declining in functional status.   HENT:  Head: Normocephalic and atraumatic.  Eyes: EOM are normal.  Neck: Normal range of motion. Neck supple. No hepatojugular reflux and no JVD present. Carotid bruit is not present.  Cardiovascular: Normal rate, regular rhythm and normal heart sounds.  Occasional  extrasystoles are present. PMI is not displaced. Exam reveals decreased pulses (BLE - Pedal &  DP pulses). Exam reveals no gallop and no friction rub.  No murmur heard. Pulmonary/Chest: Effort normal and breath sounds normal. No respiratory distress. He has no wheezes. He has no rales.  Abdominal: Soft. Bowel sounds are normal. He exhibits no distension. There is no abdominal tenderness. There is no rebound.  Truncal obesity.   No HSM  Musculoskeletal: Normal range of motion.        General: No edema (trivial).  Neurological: He is alert and oriented to person, place, and time.  Psychiatric: He has a normal mood and affect. His behavior is normal. Judgment and thought content normal.  Vitals reviewed.    Adult ECG Report Sinus rhythm, rate 88 bpm.  PACs.  Other studies Reviewed: Additional studies/ records that were reviewed today include:  Recent Labs:  See below Lab Results  Component Value Date   CHOL 155 04/03/2018   HDL 48 04/03/2018   LDLCALC 75 04/03/2018   TRIG 161 (H) 04/03/2018   CHOLHDL 3.2 04/03/2018   Lab Results  Component Value Date   CREATININE 1.06 07/04/2018   BUN 25 (H) 07/04/2018   NA 137 07/04/2018   K 4.1 07/04/2018   CL 102 07/04/2018   CO2 28 07/04/2018    ASSESSMENT / PLAN: Problem List Items Addressed This Visit    Atypical angina (HCC)    Interesting, he did have very unusual symptoms up for his anginal equivalent.  I have a hard time try to  figure out his symptoms now.  We did a nuclear stress test last November that was nonischemic for similar symptoms. Still cannot be sure, but unlikely that he would have a nonischemic Myoview just 2 months ago and something be abnormal.  However he is saying that the chest discomfort seems new and different.  Plan for now be to add Imdur 30 mg to see if this helps.  When I see him back in reevaluation, if he still having symptoms, we may be forced to consider cardiac catheterization to relook.Marland Kitchen  He is no  longer on beta-blocker or calcium channel blocker because of issues with hypotension and dizziness to the point of requiring midodrine.  Therefore I am not adding a beta-blocker.      Relevant Medications   isosorbide mononitrate (IMDUR) 30 MG 24 hr tablet   Coronary artery disease involving native coronary artery with other form of angina pectoris, unspecified whether native or transplanted heart (HCC) (Chronic)    Again very difficult to tell his symptoms.  Plan for now will be to add Imdur to see if this helps the chest discomfort portion of his symptoms and then reevaluate.  If he still has persistent symptoms would probably need to consider relook cardiac catheterization. However he did just have a negative Myoview scan and I think he should be fine to go through his myelogram off of Plavix.  Okay to hold Plavix 7 days prior to his myelogram.  Would restart 3 days post procedure.      Relevant Medications   isosorbide mononitrate (IMDUR) 30 MG 24 hr tablet   Other Relevant Orders   EKG 12-Lead   Dizziness of unknown cause (Chronic)    Very hard to figure out what the symptoms are related to.  We have taken off all of his blood pressure medications although I am can add back Imdur.  He is using midodrine but not frequently.  This really did not help all that much.  He has a myelogram scheduled and we will refer back to [facility] neurology again for second opinion.  He probably will need a brain MRI which would probably require him to have some type of sedation.      Dyslipidemia, goal LDL below 70 (Chronic)    Most recent LDL was 75.  However I still am worried about his leg weakness and aching all the symptoms.  I want to just stop the statin altogether until this issue is resolved.  I realize that that may adversely affect his lipids, but we can play catch up once we know what his symptoms are related to.      Relevant Medications   isosorbide mononitrate (IMDUR) 30 MG 24 hr tablet     Essential hypertension (Chronic)   Relevant Medications   isosorbide mononitrate (IMDUR) 30 MG 24 hr tablet   Other Relevant Orders   EKG 12-Lead   Orthostatic hypotension (Chronic)    Still occasionally uses midodrine.  Does seem to be helping now better than it had. We talked by adequate hydration. Because of having issues with hypotension, we have not been able to use classic antianginal/cardiac medication such as beta-blockers, ACE inhibitor/ARB use or calcium channel blockers.      Relevant Medications   isosorbide mononitrate (IMDUR) 30 MG 24 hr tablet   PAF (paroxysmal atrial fibrillation) (HCC) (Chronic)    Up until this past month he really has not had anything that made him feel like he may have been having A. fib.  He had 2 episodes on his home [place] monitor recorded that looked to be more like PAT.  But this would be for substrate for PA F. He is not on anticoagulation as he has been on Plavix.  This is partly because he did not have any real breakthroughs since his bypass surgery.  With no clear evidence of A. fib I think were okay continuing off of anticoagulation besides Plavix.  He would be able to hold Plavix for procedures as noted elsewhere. Currently rate seems to be controlled unless he has true episodes of A. fib we can avoid AV nodal agents.      Relevant Medications   isosorbide mononitrate (IMDUR) 30 MG 24 hr tablet   Other Relevant Orders   EKG 12-Lead   Weakness of both lower extremities - Primary    He does have Leriche syndrome which means occluded aorta.  He however had evaluations of ABIs and TBI's showing relatively decent distal flow.  Hard to imagine that his complete leg weakness would be related to this condition.  However cannot be sure.  He seems to be doing fine with activity without having claudication.   I think he does need neurologic evaluation.  Will refer back to [facility] Neurology.      Relevant Orders   Ambulatory referral to  Neurology    Other Visit Diagnoses    Dizziness       Relevant Orders   Ambulatory referral to Neurology      I spent over 45-50 minutes with the patient today.  We discussed his symptoms and potential options of treatment.  Greater than 50% of the time was spent in direct patient counseling.  Current medicines are reviewed at length with the patient today.  (+/- concerns) asked about the he will stop Plavix for procedure The following changes have been made:  see below.  Patient Instructions  Medication Instructions:  Stop Atorvastatin  START Isosorbide 30 mg daily--take this every evening, 30 minutes after tylenol  If you need a refill on your cardiac medications before your next appointment, please call your pharmacy.    Follow-Up: At [facility], you and your health needs are our priority.  As part of our continuing mission to provide you with exceptional heart care, we have created designated Provider Care Teams.  These Care Teams include your primary Cardiologist (physician) and Advanced Practice Providers (APPs -  Physician Assistants and Nurse Practitioners) who all work together to provide you with the care you need, when you need it. . Please call our office to schedule a follow-up appointment with Dr. [name] after your Myelogram and visit with Neurology.  Any Other Special Instructions Will Be Listed Below (If Applicable). You have been referred to [facility] Neurology. They will call you to schedule an appointment.  It is ok to hold Plavix 5 days prior to your Lumbar Myelogram. I have faxed the clearance letter to [facility].    Studies Ordered:   Orders Placed This Encounter  Procedures  . Ambulatory referral to Neurology  . EKG 12-Lead      Bryan Lemma, M.D., M.S. Interventional Cardiologist   Pager # [phone] Phone # [phone] [address] [place], [place] [address]   Thank you for choosing Heartcare at [facility]!!

## 2018-09-09 NOTE — Telephone Encounter (Signed)
   Primary Cardiologist: Bryan Lemma, MD  Chart reviewed as part of pre-operative protocol coverage. Given past medical history and time since last visit, based on ACC/AHA guidelines, Kameron Brack would be at acceptable risk for the planned procedure without further cardiovascular testing. Pt was seen in office today by Dr. [name] and is OK to have procedure.  OK to hold Plavix for 5 days prior to procedure. Resume after procedure.   I will route this recommendation to the requesting party via Epic fax function and remove from pre-op pool.  Please call with questions.  Berton Bon, NP 09/09/2018, 5:35 PM

## 2018-09-09 NOTE — Patient Instructions (Addendum)
Medication Instructions:  Stop Atorvastatin  START Isosorbide 30 mg daily--take this every evening, 30 minutes after tylenol  If you need a refill on your cardiac medications before your next appointment, please call your pharmacy.    Follow-Up: At [facility], you and your health needs are our priority.  As part of our continuing mission to provide you with exceptional heart care, we have created designated Provider Care Teams.  These Care Teams include your primary Cardiologist (physician) and Advanced Practice Providers (APPs -  Physician Assistants and Nurse Practitioners) who all work together to provide you with the care you need, when you need it. . Please call our office to schedule a follow-up appointment with Dr. [name] after your Myelogram and visit with Neurology.  Any Other Special Instructions Will Be Listed Below (If Applicable). You have been referred to [facility]rology. They will call you to schedule an appointment.  It is ok to hold Plavix 5 days prior to your Lumbar Myelogram. I have faxed the clearance letter to [facility].

## 2018-09-09 NOTE — Telephone Encounter (Signed)
Patient is going to see cardiology right now and the cardiologist is setting him up with neurology, so would he still need an appointment with you he ask or wait and see if these appointments help? Patient still requesting letter from PCP.

## 2018-09-09 NOTE — Telephone Encounter (Signed)
   [facility] Pre-operative Risk Assessment    Request for surgical clearance:  1. What type of surgery is being performed? LUMBAR MYELOGRAM   2. When is this surgery scheduled? TBD   3. What type of clearance is required (medical clearance vs. Pharmacy clearance to hold med vs. Both)? MEDICATION  4. Are there any medications that need to be held prior to surgery and how long? PLAVIX-5DAYS PRIOR   5. Practice name and name of physician performing surgery? [facility]   6. What is your office phone number [phone]    7.   What is your office fax number [phone]  8.   Anesthesia type (None, local, MAC, general) ? NOT LISTED   Waylan Rocher 09/09/2018, 11:42 AM  _________________________________________________________________   (provider comments below)

## 2018-09-09 NOTE — Telephone Encounter (Signed)
Dr. [name] stated OK to hold Plavix as needed for procedure. He said that he will assess pt today at office visit for clearance. We will need to see his note once it is complete.

## 2018-09-10 ENCOUNTER — Telehealth: Payer: Self-pay | Admitting: Cardiology

## 2018-09-10 ENCOUNTER — Encounter: Payer: Self-pay | Admitting: Cardiology

## 2018-09-10 DIAGNOSIS — R29898 Other symptoms and signs involving the musculoskeletal system: Secondary | ICD-10-CM | POA: Insufficient documentation

## 2018-09-10 NOTE — Assessment & Plan Note (Signed)
He does have Leriche syndrome which means occluded aorta.  He however had evaluations of ABIs and TBI's showing relatively decent distal flow.  Hard to imagine that his complete leg weakness would be related to this condition.  However cannot be sure.  He seems to be doing fine with activity without having claudication.   I think he does need neurologic evaluation.  Will refer back to [facility].

## 2018-09-10 NOTE — Telephone Encounter (Addendum)
It appears the patient received a letter from his cardiologist to evaluated him yesterday.  It would still be a good idea for him to be seen in the office though given that he is seeing cardiology and discussed his issues with them it is not urgent.  I would still be happy to write a letter for him though based on review of the cardiologists letter he may not need one for me.  Please let me know if he still needs the letter.

## 2018-09-10 NOTE — Assessment & Plan Note (Signed)
Still occasionally uses midodrine.  Does seem to be helping now better than it had. We talked by adequate hydration. Because of having issues with hypotension, we have not been able to use classic antianginal/cardiac medication such as beta-blockers, ACE inhibitor/ARB use or calcium channel blockers.

## 2018-09-10 NOTE — Assessment & Plan Note (Signed)
Again very difficult to tell his symptoms.  Plan for now will be to add Imdur to see if this helps the chest discomfort portion of his symptoms and then reevaluate.  If he still has persistent symptoms would probably need to consider relook cardiac catheterization. However he did just have a negative Myoview scan and I think he should be fine to go through his myelogram off of Plavix.  Okay to hold Plavix 7 days prior to his myelogram.  Would restart 3 days post procedure.

## 2018-09-10 NOTE — Assessment & Plan Note (Signed)
Most recent LDL was 75.  However I still am worried about his leg weakness and aching all the symptoms.  I want to just stop the statin altogether until this issue is resolved.  I realize that that may adversely affect his lipids, but we can play catch up once we know what his symptoms are related to.

## 2018-09-10 NOTE — Assessment & Plan Note (Signed)
Up until this past month he really has not had anything that made him feel like he may have been having A. fib.  He had 2 episodes on his home Kardia monitor recorded that looked to be more like PAT.  But this would be for substrate for PA F. He is not on anticoagulation as he has been on Plavix.  This is partly because he did not have any real breakthroughs since his bypass surgery.  With no clear evidence of A. fib I think were okay continuing off of anticoagulation besides Plavix.  He would be able to hold Plavix for procedures as noted elsewhere. Currently rate seems to be controlled unless he has true episodes of A. fib we can avoid AV nodal agents.

## 2018-09-10 NOTE — Telephone Encounter (Signed)
Patient is calling back to see if there will be a written letter from Dr Birdie Sons stating he can be excused to go on the cruise. He said it has to come from him as well. [phone]

## 2018-09-10 NOTE — Assessment & Plan Note (Signed)
Interesting, he did have very unusual symptoms up for his anginal equivalent.  I have a hard time try to figure out his symptoms now.  We did a nuclear stress test last November that was nonischemic for similar symptoms. Still cannot be sure, but unlikely that he would have a nonischemic Myoview just 2 months ago and something be abnormal.  However he is saying that the chest discomfort seems new and different.  Plan for now be to add Imdur 30 mg to see if this helps.  When I see him back in reevaluation, if he still having symptoms, we may be forced to consider cardiac catheterization to relook.Marland Kitchen  He is no longer on beta-blocker or calcium channel blocker because of issues with hypotension and dizziness to the point of requiring midodrine.  Therefore I am not adding a beta-blocker.

## 2018-09-10 NOTE — Assessment & Plan Note (Signed)
Very hard to figure out what the symptoms are related to.  We have taken off all of his blood pressure medications although I am can add back Imdur.  He is using midodrine but not frequently.  This really did not help all that much.  He has a myelogram scheduled and we will refer back to [facility] neurology again for second opinion.  He probably will need a brain MRI which would probably require him to have some type of sedation.

## 2018-09-10 NOTE — Telephone Encounter (Signed)
Follow Up:     Pt said he saw Dr [name] yesterday and wants to talk to his nurse only please.

## 2018-09-10 NOTE — Progress Notes (Signed)
Patient seen in clinic 09/09/2018: Asked for note to help get out of his cruise -not clinically stable enough to go on his cruise  [facility] Bryan Lemma, MD

## 2018-09-11 ENCOUNTER — Encounter: Payer: Self-pay | Admitting: Pulmonary Disease

## 2018-09-11 ENCOUNTER — Ambulatory Visit (INDEPENDENT_AMBULATORY_CARE_PROVIDER_SITE_OTHER): Payer: Medicare Other | Admitting: Pulmonary Disease

## 2018-09-11 VITALS — BP 110/60 | HR 101 | Ht 69.0 in | Wt 209.0 lb

## 2018-09-11 DIAGNOSIS — J449 Chronic obstructive pulmonary disease, unspecified: Secondary | ICD-10-CM | POA: Diagnosis not present

## 2018-09-11 DIAGNOSIS — R0600 Dyspnea, unspecified: Secondary | ICD-10-CM

## 2018-09-11 DIAGNOSIS — R0609 Other forms of dyspnea: Secondary | ICD-10-CM | POA: Diagnosis not present

## 2018-09-11 DIAGNOSIS — Z87891 Personal history of nicotine dependence: Secondary | ICD-10-CM | POA: Diagnosis not present

## 2018-09-11 DIAGNOSIS — I208 Other forms of angina pectoris: Secondary | ICD-10-CM | POA: Diagnosis not present

## 2018-09-11 MED ORDER — ALBUTEROL SULFATE HFA 108 (90 BASE) MCG/ACT IN AERS: 1.0000 | INHALATION_SPRAY | RESPIRATORY_TRACT | 10 refills | Status: AC | PRN

## 2018-09-11 MED ORDER — UMECLIDINIUM-VILANTEROL 62.5-25 MCG/INH IN AEPB: 1.0000 | INHALATION_SPRAY | Freq: Every day | RESPIRATORY_TRACT | 0 refills | Status: DC

## 2018-09-11 NOTE — Patient Instructions (Signed)
Trial of Anoro inhaler.  2 samples provided.  Use inhaler for 1 week, then stop for 1 week, then use second inhaler for 1 week until gone.  When you return to the office I want you to be able to tell me whether the inhaler helps your symptoms of shortness of breath.  New prescription: Albuterol inhaler, 1-2 puffs up to every 4 hours as needed for increased shortness of breath, chest tightness, wheezing, cough  Follow-up in 4-5 weeks with PFTs (lung function test) prior to that visit

## 2018-09-11 NOTE — Addendum Note (Signed)
Addended by: Janean Sark on: 09/11/2018 09:01 AM   Modules accepted: Orders

## 2018-09-11 NOTE — Progress Notes (Signed)
PULMONARY OFFICE FOLLOW UP NOTE  PT IDENTIFICATION: 73 y.o. [name] former smoker with mild-mod COPD, previously seen be Dr Dema Severin  PROBLEMS:  Former smoker - quit 1994 COPD - mild to moderate by PFTs CAD - S/P CABG 03/2015 Lung nodule, benign  DATA: Spirometry 04/01/15: FEV1 1.92 liters (Marc% pred), FVC 2.97 L (70% pred) Spirometry 04/16/16: FEV1 1.99 L (64% pred), 2.95 L (70% pred) CT chest 01/23/16: Moderate to severe emphysema, with 4 mm nonspecific ground-glass nodule in the right middle lobe (low likelihood of malignancy) CT chest 09/17/17: No acute findings  INTERVAL HISTORY: Last visit 11/12/17.  At that time he was told to follow-up as needed.  No major pulmonary events in the interim.   SUBJ: He returns to this office today with a report of 5-6 weeks of increased episodic exertional dyspnea and intermittent central chest discomfort.  He has minimal cough productive of scant mucus.  He notes dyspnea when walking his dog up gradual inclines at a slow pace.  This is worse than his baseline exercise tolerance.  He has not identified any exacerbating or mitigating factors.  Some days are worse than others.  He does not have any rescue medications.  He denies fever, pleuritic chest pain, purulent sputum, hemoptysis, orthopnea, paroxysmal nocturnal dyspnea, lower extremity edema, calf tenderness.  OBJ: Vitals:   09/11/18 0829 09/11/18 0835  BP:  110/60  Pulse:  (!) 101  SpO2:  96%  Weight: 209 lb (94.8 kg)   Height: 5\' 9"  (1.753 [name])   RA  Gen: NAD HEENT: NCAT, sclerae white Neck: No JVD Lungs: Moderate kyphosis, breath sounds mildly diminished.  No adventitious sounds Cardiovascular: RRR, no murmurs Abdomen: Soft, nontender, normal BS Ext: without clubbing, cyanosis, edema Neuro: grossly intact Skin: Limited exam, no lesions noted     DATA: BMP Latest Ref Rng & Units 09/04/2018 07/04/2018 04/18/2018  Glucose 70 - 99 mg/dL - 943(E) 761(Y)  BUN 6 - 23 mg/dL - 70(L) 29(V)   Creatinine 0.40 - 1.50 mg/dL - 7.47 3.40  BUN/Creat Ratio 10 - 24 - - -  Sodium 135 - 145 mEq/L - 137 139  Potassium 3.5 - 5.1 mEq/L - 4.1 4.1  Chloride 96 - 112 mEq/L - 102 104  CO2 19 - 32 mEq/L - 28 29  Calcium 8.6 - 10.3 mg/dL 8.6 9.2 9.7   CBC Latest Ref Rng & Units 07/04/2018 04/18/2018 10/03/2017  WBC 4.0 - 10.5 K/uL 10.1 11.0(H) 8.1  Hemoglobin 13.0 - 17.0 g/dL 37.0 96.4 38.3  Hematocrit 39.0 - 52.0 % 42.4 42.5 45.4  Platelets 150.0 - 400.0 K/uL 195.0 191.0 184    CXR 02/05: no acute findings  IMPRESSION: COPD, moderate (HCC) - Plan: Pulmonary Function Test [facility] Only  5-6 weeks of episodically increased DOE  Former smoker   PLAN: We have provided 2 samples of Anoro inhaler.  He is to use it for 1 week on, one-week off, 1 week on to discern whether maintenance bronchodilator therapy provides him any benefit with regard to his exertional dyspnea.  New prescription: Albuterol inhaler, 1-2 actuations up to every 4 hours as needed for increased shortness of breath, chest tightness, wheezing, cough  Follow-up in 4-5 weeks with PFTs prior to that visit   Billy Fischer, MD PCCM service Mobile [phone] Pager [phone] 09/11/2018 8:55 AM

## 2018-09-12 NOTE — Telephone Encounter (Signed)
Letter created and printed.  Placed on [employer].  Please inform patient this is available for pickup.

## 2018-09-15 ENCOUNTER — Telehealth: Payer: Self-pay

## 2018-09-15 ENCOUNTER — Telehealth: Payer: Self-pay | Admitting: Family Medicine

## 2018-09-15 NOTE — Telephone Encounter (Signed)
Called and informed the pt that a letter from the provider was ready to pick up be mailed, pt stated he will pick up this afternoon.  [name]

## 2018-09-15 NOTE — Telephone Encounter (Signed)
Pt dropped off Attending physician statement signed by Dr. Birdie Sons. Form is upfront in color folder.

## 2018-09-15 NOTE — Telephone Encounter (Signed)
Patient appointment scheduled and note placed at front desk by CMA.

## 2018-09-15 NOTE — Telephone Encounter (Signed)
Picked up form and put it in Dr. Purvis Sheffield folder to be signed.  Talbert Nan

## 2018-09-16 ENCOUNTER — Ambulatory Visit
Admission: RE | Admit: 2018-09-16 | Discharge: 2018-09-16 | Disposition: A | Discharge status: Home / Self Care | Payer: Medicare Other | Source: Ambulatory Visit | Attending: Family Medicine | Admitting: Family Medicine

## 2018-09-16 DIAGNOSIS — G8929 Other chronic pain: Secondary | ICD-10-CM

## 2018-09-16 DIAGNOSIS — M48061 Spinal stenosis, lumbar region without neurogenic claudication: Secondary | ICD-10-CM | POA: Diagnosis not present

## 2018-09-16 DIAGNOSIS — M545 Low back pain, unspecified: Secondary | ICD-10-CM

## 2018-09-16 MED ORDER — IOPAMIDOL (ISOVUE-M 200) INJECTION 41%
15.0000 mL | Freq: Once | INTRAMUSCULAR | Status: AC
  Administered 2018-09-16: 15 mL via INTRATHECAL

## 2018-09-16 MED ORDER — DIAZEPAM 5 MG PO TABS
5.0000 mg | ORAL_TABLET | Freq: Once | ORAL | Status: AC
  Administered 2018-09-16: 5 mg via ORAL

## 2018-09-16 NOTE — Progress Notes (Signed)
Pt reports he has been off of his plavix for at least 5 days for myelogram procedure today.

## 2018-09-16 NOTE — Discharge Instructions (Signed)

## 2018-09-17 ENCOUNTER — Encounter: Payer: Self-pay | Admitting: Family Medicine

## 2018-09-17 DIAGNOSIS — M5416 Radiculopathy, lumbar region: Secondary | ICD-10-CM

## 2018-09-23 ENCOUNTER — Encounter: Payer: Self-pay | Admitting: Neurology

## 2018-09-23 ENCOUNTER — Other Ambulatory Visit: Payer: Self-pay

## 2018-09-23 ENCOUNTER — Ambulatory Visit (INDEPENDENT_AMBULATORY_CARE_PROVIDER_SITE_OTHER): Payer: Medicare Other | Admitting: Neurology

## 2018-09-23 VITALS — BP 126/78 | HR 95 | Ht 68.5 in | Wt 208.0 lb

## 2018-09-23 DIAGNOSIS — Z8679 Personal history of other diseases of the circulatory system: Secondary | ICD-10-CM

## 2018-09-23 DIAGNOSIS — I208 Other forms of angina pectoris: Secondary | ICD-10-CM

## 2018-09-23 DIAGNOSIS — I480 Paroxysmal atrial fibrillation: Secondary | ICD-10-CM | POA: Diagnosis not present

## 2018-09-23 DIAGNOSIS — R351 Nocturia: Secondary | ICD-10-CM

## 2018-09-23 DIAGNOSIS — R42 Dizziness and giddiness: Secondary | ICD-10-CM | POA: Diagnosis not present

## 2018-09-23 DIAGNOSIS — R0683 Snoring: Secondary | ICD-10-CM | POA: Diagnosis not present

## 2018-09-23 DIAGNOSIS — H81399 Other peripheral vertigo, unspecified ear: Secondary | ICD-10-CM | POA: Diagnosis not present

## 2018-09-23 DIAGNOSIS — R51 Headache: Secondary | ICD-10-CM | POA: Diagnosis not present

## 2018-09-23 DIAGNOSIS — R519 Headache, unspecified: Secondary | ICD-10-CM

## 2018-09-23 DIAGNOSIS — Z951 Presence of aortocoronary bypass graft: Secondary | ICD-10-CM

## 2018-09-23 NOTE — Progress Notes (Signed)
Subjective:    Patient ID: [name] is a 73 y.o. male.  HPI     Marc Foley, MD, PhD [facility] [address], [address] [address] [place], [place] [address] Dear Dr. [name],   I saw your patient, [name], upon your kind request, in my neurologic clinic today for evaluation of his recurrent dizzy spells, for second opinion. Patient is unaccompanied today. Of note, the patient canceled an appointment for 05/21/2018 and was recently re-referred on 09/09/2018. As you know, [name] is a 73 year old right-handed gentleman with an underlying complex medical history of coronary artery disease with status post 5 vessel CABG in 2016, paroxysmal A. fib as noted in 2016, hyperlipidemia, hypertension with recent low blood pressure values, depression, arthritis, chronic kidney disease, COPD, peripheral vascular disease, history of kidney stones and borderline obesity, who reports recurrent and intermittent and short-lived spells of dizziness which include lightheadedness and also a sense of spinning sensation at times. He has seen ENT in the past for vertigo. He has also seen neurology at [facility] clinic mid last year for the same symptoms, and had a heart monitor, which was benign. He was also advised to proceed with an MRI but could not go through with it secondary to claustrophobia.  He reports a sense of rising heat sometimes it goes from the back of the neck upper chest and head. He has had recurrent headaches and different locations of the head, sometimes in the temple areas, sometimes on top of the head. He has had spinning sensation. Most of his symptoms occur when he is upright, either standing or walking. He has to hold onto something, has not fallen recently. He denies family history of Parkinson's disease, he denies constipation. He has had urinary frequency. He had a procedure for this which did not have any lasting results. He has been on metoprolol which may have  caused daytime tiredness and somnolence and he was advised to stop the beta blocker. For hypotension recently he has been tried on midodrin. He had a carotid Doppler study on 10/03/2017 which I reviewed: IMPRESSION: Minor carotid atherosclerosis. No hemodynamically significant ICA stenosis. Degree of narrowing less than 50% bilaterally by ultrasound criteria.   Patent antegrade vertebral flow bilaterally   He had a nuclear stress test which did not show any significant ischemic changes. He had a head CT without contrast on 10/02/2017 and I reviewed the results: IMPRESSION: Atrophy with minimal small vessel chronic ischemic changes of deep cerebral white matter.   No acute intracranial abnormalities.  He reports recurrent headaches. He has had morning headaches. He attributes some of his headaches to taking isosorbide. His Epworth sleepiness score is 5 out of 24, his fatigue score is 39 out of 63. He snores. He lives with his wife, he quit smoking in 1994, drinks alcohol socially, less than once a month, caffeine not much, only decaff coffee typically.   He has never had a sleep study. He has nocturia, several times a night.   His Past Medical History Is Significant For: Past Medical History:  Diagnosis Date  . Arthritis   . CAD, multiple vessel 03/2105   LM 65%, mLAD 70%, 90% & 80%, o-pCx 75%, mCx 95% & d Cx 70%, OM1 60%, mRCA 90% with 100% dRCA. R-R collaterals  . Chronic distal aortic occlusion (HCC)    By report  . Chronic kidney disease   . COPD (chronic obstructive pulmonary disease) (HCC)   . Depression   . Diabetes  mellitus without complication (HCC) 08/20/2018  . Enlarged prostate   . Erectile dysfunction   . Essential hypertension   . History of kidney stones    multiple lithotripsies  . Hyperlipidemia LDL goal <70   . Hypogonadism in male    Previously on testosterone injections, but has not yet restarted until further testing  . Muscle pain   . PAF (paroxysmal atrial  fibrillation) (HCC) 03/2015   Noted per-cath & post-op CABG.  Dr. [name], cardiology  . Peripheral vascular disease (HCC)    Bilateral ABIs 0.8  . Renal stones 03/2016  . S/P CABG x 5 04/04/2015   LIMA to LAD, SVG to DIAG, SVG to OM, seqSVG -PDA- dCx  . SOB (shortness of breath)   . 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 Weakness    weakness bilateral legs, s/p vein harvesting for CABPG    His Past Surgical History Is Significant For: Past Surgical History:  Procedure Laterality Date  . 48-HOUR HOLTER MONITOR  02/2018   Sinus rhythm with some sinus bradycardia and sinus tachycardia.  Rare PVCs.  3 episodes of trigeminy for a total of 51 beats.  Frequent PACs no runs, but 3 pairs noted.  12 bigeminal episodes noted.  No arrhythmia.  Minimum heart rate 55 bpm, max 135 bpm. ->  Reportedly had 2 events while wearing monitor, but no obvious abnormality associated  . BLEPHAROPLASTY    . CARDIAC CATHETERIZATION  1992  . CARDIAC CATHETERIZATION N/A 03/31/2015   Procedure: Left Heart Cath and Coronary Angiography;  Surgeon: Marykay Lex, MD;  Location: [facility] INVASIVE CV LAB: LM 65%, mLAD 70%, 90% & 80%, o-pCx 75%, mCx 95% & d Cx 70%, OM1 60%, mRCA 90% with 100% dRCA. R-R collaterals --> CABG referral. Also noted to be in Afib.  . Carotid Dopplers  09/2017   Patent vertebrals. Mild bilateral carotid plaque, <50% stenosis bilaterally.  . CHOLECYSTECTOMY  2000   also had revision of hernia repair  . CORONARY ARTERY BYPASS GRAFT N/A 04/04/2015   Procedure: CORONARY ARTERY BYPASS GRAFTING (CABG);  Surgeon: [name], MD;  Location: [facility] OR;  Service: Open Heart Surgery;  Laterality: N/A;  . CYSTECTOMY     face and scrotum  . CYSTOSCOPY WITH INSERTION OF UROLIFT N/A 11/12/2016   Procedure: CYSTOSCOPY WITH INSERTION OF UROLIFT x4;  Surgeon: Jethro Bolus, MD;  Location: [facility] ORS;  Service: Urology;  Laterality: N/A;  . CYSTOSCOPY WITH RETROGRADE  PYELOGRAM, URETEROSCOPY AND STENT PLACEMENT Right 06/26/2016   Procedure: CYSTOSCOPY WITH RIGHT RETROGRADE PYELOGRAM, RIGHT URETEROSCOPY , HOLMIUM LASER, BASKET EXTRACTION AND STENT PLACEMENT;  Surgeon: Jethro Bolus, MD;  Location: [facility] ORS;  Service: Urology;  Laterality: Right;  . CYSTOSCOPY WITH RETROGRADE PYELOGRAM, URETEROSCOPY AND STENT PLACEMENT Left 09/23/2017   Procedure: CYSTOSCOPY WITH LEFT RETROGRADE PYELOGRAM, URETEROSCOPY STONE EXTRACTION AND STENT PLACEMENT;  Surgeon: Crista Elliot, MD;  Location: [facility] ORS;  Service: Urology;  Laterality: Left;  . LEXISCAN MYOVIEW  June 2010   converted to [facility] from treadmill due to inability to reach target heart rate) --the study was normal with no evidence of ischemia and normal LV function. EF 7  . LITHOTRIPSY    . NM MYOVIEW LTD  05/2018   LOW RISK EF 45 to 54%.  Small fixed mild severity defect in the apex which could present either subendocardial infarct versus artifact.  No evidence of ischemia.  . TEE WITHOUT CARDIOVERSION N/A 04/04/2015  Procedure: TRANSESOPHAGEAL ECHOCARDIOGRAM (TEE);  Surgeon: [name], MD;  Location: [facility] OR;  Service: Open Heart Surgery;  Laterality: N/A;  . TRANSTHORACIC ECHOCARDIOGRAM  12/30/2016   EF 60-65%. GR 1 DD. Mildly dilated ascending aorta. Otherwise essentially normal with no valve lesions.  Marland Kitchen VASECTOMY  1975  . VENTRAL HERNIA REPAIR  1994   complicated by infection    His Family History Is Significant For: Family History  Problem Relation Age of Onset  . Coronary artery disease Father   . Heart disease Father   . Stroke Mother   . Hearing loss Mother   . Stroke Sister   . Hearing loss Maternal Grandmother   . Prostate cancer Neg Hx   . Kidney disease Neg Hx   . Other Neg Hx        hypogonadism    His Social History Is Significant For: Social History   Socioeconomic History  . Marital status: Married    Spouse name: Not on file  . Number of children: 2  . Years of  education: 71  . Highest education level: Not on file  Occupational History  . Not on file  Social Needs  . Financial resource strain: Not hard at all  . Food insecurity:    Worry: Never true    Inability: Never true  . Transportation needs:    Medical: No    Non-medical: No  Tobacco Use  . Smoking status: Former Smoker    Packs/day: 2.00    Years: 40.00    Pack years: 80.00    Types: Cigarettes    Last attempt to quit: 10/17/1992    Years since quitting: 25.9  . Smokeless tobacco: Never Used  . Tobacco comment: quit 1994  Substance and Sexual Activity  . Alcohol use: Yes    Alcohol/week: 1.0 standard drinks    Types: 1 Standard drinks or equivalent per week    Comment: seldom - vodka or wine  . Drug use: No  . Sexual activity: Never  Lifestyle  . Physical activity:    Days per week: 5 days    Minutes per session: Not on file  . Stress: Not at all  Relationships  . Social connections:    Talks on phone: Not on file    Gets together: Not on file    Attends religious service: Not on file    Active member of club or organization: Not on file    Attends meetings of clubs or organizations: Not on file    Relationship status: Not on file  Other Topics Concern  . Not on file  Social History Narrative   Lives in [place] with wife. No pets   Two children.      Former smoker. Quit in 1994. Prior to quitting was unable to walk without exertional dyspnea.      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      Hobbies - reading, drives Corvette, chuch    His Allergies Are:  No Known Allergies:   His Current Medications Are:  Outpatient Encounter Medications as of 09/23/2018  Medication Sig  . acetaminophen (TYLENOL) 650 MG CR tablet Take 650 mg by mouth every 8 (eight) hours as needed for pain.  Marland Kitchen albuterol (PROVENTIL HFA;VENTOLIN HFA) 108 (90 Base) MCG/ACT inhaler Inhale 1-2 puffs into the lungs  every 4 (four) hours as needed for wheezing or shortness of breath.  . clopidogrel (  PLAVIX) 75 MG tablet TAKE 1 TABLET EVERY DAY  . eszopiclone (LUNESTA) 1 MG TABS tablet Take 1 tablet (1 mg total) by mouth at bedtime as needed for sleep. Take immediately before bedtime  . isosorbide mononitrate (IMDUR) 30 MG 24 hr tablet Take 1 tablet (30 mg total) by mouth daily.  . midodrine (PROAMATINE) 5 MG tablet Take 1 tablet (5 mg total) by mouth 2 (two) times daily with a meal.  . tamsulosin (FLOMAX) 0.4 MG CAPS capsule Take 0.4 mg by mouth daily.   Marland Kitchen umeclidinium-vilanterol (ANORO ELLIPTA) 62.5-25 MCG/INH AEPB Inhale 1 puff into the lungs daily.  . [DISCONTINUED] NITROSTAT 0.4 MG SL tablet PLACE 1 TABLET (0.4 MG TOTAL) UNDER THE TONGUE EVERY 5 (FIVE) MINUTES AS NEEDED FOR CHEST PAIN. (Patient not taking: Reported on 09/11/2018)   No facility-administered encounter medications on file as of 09/23/2018.   :   Review of Systems:  Out of a complete 14 point review of systems, all are reviewed and negative with the exception of these symptoms as listed below: Review of Systems  Neurological:       Pt presents today to discuss his episodic dizziness. Pt also wants to discuss headaches, which he reports may be related to his isosorbide. Pt wakes up with headaches and does snore. Pt has never had a sleep study. Pt has almost a daily headache.  Epworth Sleepiness Scale 0= would never doze 1= slight chance of dozing 2= moderate chance of dozing 3= high chance of dozing  Sitting and reading: 0 Watching TV: 0 Sitting inactive in a public place (ex. Theater or meeting): 1 As a passenger in a car for an hour without a break: 0 Lying down to rest in the afternoon: 3 Sitting and talking to someone: 0 Sitting quietly after lunch (no alcohol): 1 In a car, while stopped in traffic: 0 Total: 5     Objective:  Neurological Exam  Physical Exam Physical Examination:   Vitals:   09/23/18 1459  BP: 126/78   Pulse: 95   Orthostatic: lying 143/79, 84, sitting: 126/78, 95, standing: 111/70, 97.  General Examination: The patient is a very pleasant 73 y.o. male in no acute distress. He appears well-developed and well-nourished and well groomed. He denied any orthostatic dizziness.   HEENT: Normocephalic, atraumatic, pupils are equal, round and reactive to light. Extraocular tracking is fairly well preserved, perhaps mild saccadic breakdown of the smooth pursuit. Hearing is grossly intact. Questionable facial masking is noted, questionable mild nuchal rigidity is noted. Speech is clear, no dysarthria, no hypophonia. No carotid bruits noted. Airway examination reveals smaller airway opening, tonsils are small but uvula is mildly enlarged, Mallampati is class II, neck circumference is 16-3/4 inches. Nasal inspection reveals slight deviated septum to the right but otherwise no significant findings. There is no lip, neck or jaw tremor. Tongue protrudes centrally and palate elevates symmetrically.  Chest: Clear to auscultation without wheezing, rhonchi or crackles noted.  Heart: S1+S2+0, regular and normal without murmurs, rubs or gallops noted.   Abdomen: Soft, non-tender and non-distended with normal bowel sounds appreciated on auscultation.  Extremities: There is no pitting edema in the distal lower extremities bilaterally.  Skin: Warm and dry without trophic changes noted.  Musculoskeletal: exam reveals no obvious joint deformities, tenderness or joint swelling or erythema.   Neurologically:  Mental status: The patient is awake, alert and oriented in all 4 spheres. His immediate and remote memory, attention, language skills and fund of knowledge are appropriate. There  is no evidence of aphasia, agnosia, apraxia or anomia. Speech is clear with normal prosody and enunciation. Thought process is linear. Mood is normal and affect is normal.  Cranial nerves II - XII are as described above under HEENT exam.  In addition: shoulder shrug is normal with equal shoulder height noted. Motor exam: Normal bulk, strength and tone is noted. There is no Resting tremor, no postural or action tremor. Fine motor skills are minimally impaired bilaterally with finger taps and foot taps, no lateralization noted. Romberg is negative, reflexes symmetrical, 1-2+ bilaterally in the upper and lower extremities. Cerebellar testing: No dysmetria or intention tremor. There is no truncal or gait ataxia.  Sensory exam: intact to light touch in the upper and lower extremities.  Gait, station and balance: He stands easily. Posture is age-appropriate, perhaps slightly more stooped, he denies any vertiginous spells or lightheadedness. He walks with good stride length and pace, perhaps slightly reduced arm swing bilaterally. He turns well, and seems preserved.  Assessment and plan:   In summary, Caulder Wehner is a very pleasant 73 y.o.-year old male with an underlying complex medical history of coronary artery disease with status post 5 vessel CABG in 2016, paroxysmal A. fib as noted in 2016, hyperlipidemia, hypertension with recent low blood pressure values, depression, arthritis, chronic kidney disease, COPD, peripheral vascular disease, history of kidney stones and borderline obesity, who Presents for evaluation of his intermittent dizzy spells and recurrent headaches. He reports also spells of feeling abnormal, rising sensation that goes from the neck upwards. On examination, he has no telltale abnormalities, he does have orthostatic hypotension but no symptoms with it. He has been tried on Midodrine for this. He had a recent changes in his medication and noticed an increase in his headaches when he started to him to her but has been pre-medicating with Tylenol and that has been helpful. I am not sure I can explain his dizzy spells with an underlying primary neurologic cause. He does not have any telltale signs of parkinsonism but a few  subtle findings. I talked to the patient about his symptoms and further workup from a neurological standpoint. I asked him to proceed with a brain MRI with and without contrast along as his kidney function is stable. We will repeat CMP today to make sure. I would like to go ahead with an EEG and also sleep study to rule out obstructive sleep apnea. Unfortunately, I don't have a unifying diagnosis as yet. We have multiple symptoms that we can further investigate.  We will keep them posted as to his test results. I would be happy to prescribe some Xanax since he mentions claustrophobia with MRI, I have requested an open MRI as well. He would be willing to get a sleep study done but has concerns regarding his restlessness at night, I explained the EEG and sleep test procedures to him, we can do these in our office here. He is reassured. I would like to reconvene after testing is completed. I may consider a DaT scan down the Road but have not mentioned it to him quite yet, I considered it after he left the office.  For now, we will proceed with further testing as mentioned. He is reminded to stay very well hydrated and change positions slowly. He may benefit from using compression stockings.  I answered all his questions today before he left and he was in agreement with the plan. Thank you very much for allowing me to participate in the care  of this nice patient. If I can be of any further assistance to you please do not hesitate to call me at [phone].  Sincerely,   Star Age, MD, PhD

## 2018-09-23 NOTE — Patient Instructions (Addendum)
I am not sure how to explain your dizzy spells, you do have a significant drop in your blood pressure when sitting and standing. Please stay well hydrated with water, change positions slowly. I would recommend the following:  We will do a brain scan, called MRI and call you with the test results. We will have to schedule you for this on a separate date. This test requires authorization from your insurance, and we will take care of the insurance process. I will call in Xanax for the MRI when you are ready to schedule. I have requested an open MRI.  We will do an EEG (brainwave test), which we will schedule. We will call you with the results. Sleep study to rule out sleep apnea.

## 2018-09-24 ENCOUNTER — Telehealth: Payer: Self-pay | Admitting: Neurology

## 2018-09-24 ENCOUNTER — Telehealth: Payer: Self-pay

## 2018-09-24 LAB — COMPREHENSIVE METABOLIC PANEL
ALT: 17 IU/L (ref 0–44)
AST: 12 IU/L (ref 0–40)
Albumin/Globulin Ratio: 1.8 (ref 1.2–2.2)
Albumin: 4.3 g/dL (ref 3.7–4.7)
Alkaline Phosphatase: 62 IU/L (ref 39–117)
BUN/Creatinine Ratio: 22 (ref 10–24)
BUN: 28 mg/dL — ABNORMAL HIGH (ref 8–27)
Bilirubin Total: 0.2 mg/dL (ref 0.0–1.2)
CO2: 21 mmol/L (ref 20–29)
Calcium: 9.1 mg/dL (ref 8.6–10.2)
Chloride: 103 mmol/L (ref 96–106)
Creatinine, Ser: 1.29 mg/dL — ABNORMAL HIGH (ref 0.76–1.27)
GFR calc Af Amer: 63 mL/min/{1.73_m2} (ref >59)
GFR calc non Af Amer: 54 mL/min/{1.73_m2} — ABNORMAL LOW (ref >59)
Globulin, Total: 2.4 g/dL (ref 1.5–4.5)
Glucose: 102 mg/dL — ABNORMAL HIGH (ref 65–99)
Potassium: 4.5 mmol/L (ref 3.5–5.2)
Sodium: 140 mmol/L (ref 134–144)
Total Protein: 6.7 g/dL (ref 6.0–8.5)

## 2018-09-24 NOTE — Telephone Encounter (Signed)
Pt called to inform us that his MRI is scheduled for Monday the 16th @9 :45am at [facility].

## 2018-09-24 NOTE — Progress Notes (Signed)
Kidney function slightly impaired, please remind patient to hydrate better with water, he may still be okay proceeding with a brain MRI with and without contrast. Radiology will be able to make the decision.  Please call pt to update him about his lab. Janene Harvey

## 2018-09-24 NOTE — Telephone Encounter (Signed)
Relation to pt: self  Call back number: [phone]  Reason for call:  Patient checking on the status of form mentioned below.

## 2018-09-24 NOTE — Telephone Encounter (Signed)
Medicare/[employer] supp Berkley Harvey: NPR via [employer] website bc it is a Chief Technology Officer supp. oder faxed to [facility] they will reach out to the pt to schedule. Pt requested open MRI. [facility] Ph # [phone] & fax # [phone].

## 2018-09-24 NOTE — Telephone Encounter (Signed)
Pt returned my call. He is agreeable to hydrating better with water and understands that he may not be able to get contrast with the MRIs but radiology will make that call. Pt verbalized understanding of results. Pt had no questions at this time but was encouraged to call back if questions arise.

## 2018-09-24 NOTE — Telephone Encounter (Signed)
-----   Message from [name], MD sent at 09/24/2018  9:12 AM EDT ----- Kidney function slightly impaired, please remind patient to hydrate better with water, he may still be okay proceeding with a brain MRI with and without contrast. Radiology will be able to make the decision.  Please call pt to update him about his lab. Janene Harvey

## 2018-09-24 NOTE — Telephone Encounter (Signed)
I called pt to discuss his lab work. No answer, left a message asking him to call me back. 

## 2018-09-24 NOTE — Telephone Encounter (Signed)
Sent to PCP do you have this form?

## 2018-09-24 NOTE — Telephone Encounter (Signed)
Noted, thanks!

## 2018-09-25 ENCOUNTER — Other Ambulatory Visit: Payer: Self-pay

## 2018-09-25 ENCOUNTER — Ambulatory Visit
Admission: RE | Admit: 2018-09-25 | Discharge: 2018-09-25 | Disposition: A | Discharge status: Home / Self Care | Payer: Medicare Other | Source: Ambulatory Visit | Attending: Family Medicine | Admitting: Family Medicine

## 2018-09-25 DIAGNOSIS — M5416 Radiculopathy, lumbar region: Secondary | ICD-10-CM

## 2018-09-25 DIAGNOSIS — M48061 Spinal stenosis, lumbar region without neurogenic claudication: Secondary | ICD-10-CM | POA: Diagnosis not present

## 2018-09-25 MED ORDER — ALPRAZOLAM 0.5 MG PO TABS: ORAL_TABLET | ORAL | 0 refills | Status: DC

## 2018-09-25 MED ORDER — METHYLPREDNISOLONE ACETATE 40 MG/ML INJ SUSP (RADIOLOG
120.0000 mg | Freq: Once | INTRAMUSCULAR | Status: AC
  Administered 2018-09-25: 120 mg via EPIDURAL

## 2018-09-25 MED ORDER — IOPAMIDOL (ISOVUE-M 200) INJECTION 41%
1.0000 mL | Freq: Once | INTRAMUSCULAR | Status: AC
  Administered 2018-09-25: 1 mL via EPIDURAL

## 2018-09-25 NOTE — Telephone Encounter (Signed)
I have ordered Xanax for patient's upcoming MRI due to anxiety/claustrophobia reported. Please inform patient or caregiver and remind them, that he should not drive after taking Xanax and have someone take him for the MRI appointment.   

## 2018-09-25 NOTE — Telephone Encounter (Signed)
Called and spoke with pt. Pt advised paper work is ready for pick up. Placed up front. Pt advised and voiced understanding. Copy placed in my yellow fold incase we need to have this scanned into pt's chart.

## 2018-09-25 NOTE — Telephone Encounter (Signed)
Patient is calling in to check status of med to help him relax during his MRI is scheduled for Monday

## 2018-09-25 NOTE — Addendum Note (Signed)
Addended by: [name] on: 09/25/2018 03:32 PM   Modules accepted: Orders

## 2018-09-25 NOTE — Telephone Encounter (Signed)
Completed.

## 2018-09-25 NOTE — Discharge Instructions (Signed)

## 2018-09-25 NOTE — Telephone Encounter (Signed)
I called pt and advised him of this information. Pt reports that he will bring a driver and not drive after taking xanax.

## 2018-09-26 ENCOUNTER — Telehealth: Payer: Self-pay | Admitting: Cardiology

## 2018-09-26 NOTE — Telephone Encounter (Signed)
Returned call to patient he wanted to know if forms ready to be picked up.Stated he can come now if ready.Advised I will send message to Dr.Harding's RN.

## 2018-09-26 NOTE — Telephone Encounter (Signed)
SPOKE TO PATIENT . HE CAN PICKUP FORM . VERBALIZED UNDERSTANDING.

## 2018-09-26 NOTE — Telephone Encounter (Signed)
Pt needed an insurance claims form to have Dr. [name] fill out(Attending Physician Statement), and he was wondering if it was ready yet. He said he talked with his nurse on Tuesday about it. He is in the [place] area today and can pick it up if it is done

## 2018-09-26 NOTE — Telephone Encounter (Signed)
SEE TELEPHONE E MESSAGE - LETTER WRITTEN AND FORM FILLED OUT

## 2018-09-29 NOTE — Telephone Encounter (Signed)
Noted, thank you

## 2018-09-29 NOTE — Telephone Encounter (Signed)
Just an FYI Patient was unable to complete exam due to claustrophobia. Patient did not want to attempt or reschedule.

## 2018-09-30 ENCOUNTER — Other Ambulatory Visit: Payer: Self-pay

## 2018-09-30 ENCOUNTER — Ambulatory Visit: Payer: Medicare Other | Attending: Pulmonary Disease

## 2018-09-30 DIAGNOSIS — J449 Chronic obstructive pulmonary disease, unspecified: Secondary | ICD-10-CM | POA: Insufficient documentation

## 2018-09-30 MED ORDER — ALBUTEROL SULFATE (2.5 MG/3ML) 0.083% IN NEBU
2.5000 mg | INHALATION_SOLUTION | Freq: Once | RESPIRATORY_TRACT | Status: AC
  Administered 2018-09-30: 2.5 mg via RESPIRATORY_TRACT
  Filled 2018-09-30: qty 3

## 2018-09-30 NOTE — Telephone Encounter (Signed)
Noted, thanks!

## 2018-09-30 NOTE — Telephone Encounter (Signed)
I called pt and discussed Dr. Teofilo Pod recommendations. Pt will delay sleep study testing but does plan on keeping his EEG appt on 10/23/18. Pt verbalized understanding of recommendations.

## 2018-09-30 NOTE — Telephone Encounter (Signed)
Pt said he was unable to have MRI yesterday due to being highly claustrobic. He did take the medication but when the mask was laid on his face he had a panic attack. He said [facility] advised he could have it at the hospital under anesthesia. He is not wanting to r/s at this time. He is also not wanting to schedule the sleep study yet due to his age and not wanting to be out in the public due to covid-19.

## 2018-09-30 NOTE — Telephone Encounter (Signed)
Noted and okay to delay testing.

## 2018-10-01 NOTE — Telephone Encounter (Signed)
Noted, thanks!

## 2018-10-06 ENCOUNTER — Telehealth: Payer: Self-pay | Admitting: Pulmonary Disease

## 2018-10-06 NOTE — Telephone Encounter (Signed)
Pt is requesting PFT results from 09/30/18, as he canceled OV for 10/09/18 due to covid-19.   Dr. Sung Amabile please advise. Thanks

## 2018-10-09 ENCOUNTER — Ambulatory Visit: Payer: Medicare Other | Admitting: Pulmonary Disease

## 2018-10-09 NOTE — Telephone Encounter (Signed)
His PFTs reveal mild obstruction.  This is no worse and perhaps slightly better than previous tests done in 2016 and 2017.  Will discuss further upon follow-up with me  Thanks  Theodoro Grist

## 2018-10-09 NOTE — Telephone Encounter (Signed)
Pt is aware of results and voiced his understanding.  Pt has been scheduled for OV 01/12/19 with DS. Nothing further is needed.

## 2018-10-13 ENCOUNTER — Ambulatory Visit: Payer: Medicare Other | Admitting: Family Medicine

## 2018-10-23 ENCOUNTER — Other Ambulatory Visit: Payer: Medicare Other

## 2018-10-27 ENCOUNTER — Other Ambulatory Visit: Payer: Self-pay | Admitting: Family Medicine

## 2018-10-27 NOTE — Telephone Encounter (Signed)
Requested medication (s) are due for refill today: yes  Requested medication (s) are on the active medication list: yes  Last refill:  Medication last filled on 09/09/18 #90 with 3 refills by another provider but prescription expires on 12/08/18  Future visit scheduled: yes  Notes to clinic:  Unable to refill per protocol. Medication previously filled another provider    Requested Prescriptions  Pending Prescriptions Disp Refills   isosorbide mononitrate (IMDUR) 30 MG 24 hr tablet 90 tablet 3    Sig: Take 1 tablet (30 mg total) by mouth daily.     Cardiovascular:  Nitrates Passed - 10/27/2018  2:22 PM      Passed - Last BP in normal range    BP Readings from Last 1 Encounters:  09/25/18 132/80         Passed - Last Heart Rate in normal range    Pulse Readings from Last 1 Encounters:  09/25/18 89         Passed - Valid encounter within last 12 months    Recent Outpatient Visits          1 month ago Chronic low back pain, unspecified back pain laterality, unspecified whether sciatica present   [facility], Clifton Custard, DO   2 months ago Cough   [facility] [place], Yehuda Mao, MD   2 months ago Lumbar radiculopathy   [facility], Clifton Custard, DO   3 months ago Orthostatic hypotension   [facility], Janna Arch, FNP   4 months ago Orthostatic hypotension   [facility] [place] [facility], Janna Arch, FNP      Future Appointments            In 1 month Birdie Sons, Yehuda Mao, MD [facility]   In 2 months Sung Amabile, Oley Balm, MD [facility]   In 2 months Birdie Sons, Yehuda Mao, MD [facility]   In 2 months O'Brien-Blaney, Vivianne Spence, LPN [facility]   In 2 months Birdie Sons, Yehuda Mao, MD [facility], [facility]

## 2018-10-28 ENCOUNTER — Ambulatory Visit: Payer: Self-pay

## 2018-10-28 ENCOUNTER — Ambulatory Visit (INDEPENDENT_AMBULATORY_CARE_PROVIDER_SITE_OTHER): Payer: Medicare Other | Admitting: Family Medicine

## 2018-10-28 ENCOUNTER — Encounter: Payer: Self-pay | Admitting: Family Medicine

## 2018-10-28 VITALS — BP 98/60 | HR 101 | Ht 68.5 in | Wt 206.0 lb

## 2018-10-28 DIAGNOSIS — R351 Nocturia: Secondary | ICD-10-CM | POA: Diagnosis not present

## 2018-10-28 DIAGNOSIS — M7062 Trochanteric bursitis, left hip: Secondary | ICD-10-CM

## 2018-10-28 DIAGNOSIS — I208 Other forms of angina pectoris: Secondary | ICD-10-CM

## 2018-10-28 DIAGNOSIS — M25552 Pain in left hip: Principal | ICD-10-CM

## 2018-10-28 DIAGNOSIS — M7061 Trochanteric bursitis, right hip: Secondary | ICD-10-CM | POA: Diagnosis not present

## 2018-10-28 DIAGNOSIS — M25551 Pain in right hip: Secondary | ICD-10-CM | POA: Diagnosis not present

## 2018-10-28 DIAGNOSIS — N401 Enlarged prostate with lower urinary tract symptoms: Secondary | ICD-10-CM | POA: Diagnosis not present

## 2018-10-28 NOTE — Progress Notes (Signed)
[name] [address] [place], [place] [address] Phone: [phone] Subjective:    I'm seeing this patient by the request  of:    CC:   [id number]   09/04/2018 I believe the patient's back pain and leg pain is secondary to more of a lumbar radiculopathy.  Due to claustrophobia patient is unable to tolerate a MRI and we will do a CT myelogram.  Patient will hold Plavix for 5 days before the testing.  In addition to this we will get other labs which patient has not gotten for some time.  We discussed the possibility of a protein electrophoresis as well as some other labs that have been ordered previously but patient has never went to have them drawn.  Patient given Pletal to help with any type of vascularity that could be contributing to some of the pain.  Follow-up with me again after imaging to discuss possible treatment options such as epidural steroid injections  Updated 10/28/2018 Sho Salguero is a 73 y.o. male coming in with complaint of bilateral hip pain. Posterior hip pain. Mid back is also painful. Lateral hip and groin pain. Epidural lasted about 3 days.  Patient states that the pain is worsening again.  Patient states that when he did have some improvement with the epidural it was about 60% better.  Never without pain.  Has noticed more stiffness recently.  Maybe not quite as active.  Trying to walk 3 times a day though.       Past Medical History:  Diagnosis Date  . Arthritis   . CAD, multiple vessel 03/2105   LM 65%, mLAD 70%, 90% & 80%, o-pCx 75%, mCx 95% & d Cx 70%, OM1 60%, mRCA 90% with 100% dRCA. R-R collaterals  . Chronic distal aortic occlusion (HCC)    By report  . Chronic kidney disease   . COPD (chronic obstructive pulmonary disease) (HCC)   . Depression   . Diabetes mellitus without complication (HCC) 08/20/2018  . Enlarged prostate   . Erectile dysfunction   . Essential hypertension   . History of kidney stones    multiple  lithotripsies  . Hyperlipidemia LDL goal <70   . Hypogonadism in male    Previously on testosterone injections, but has not yet restarted until further testing  . Muscle pain   . PAF (paroxysmal atrial fibrillation) (HCC) 03/2015   Noted per-cath & post-op CABG.  Dr. [name], cardiology  . Peripheral vascular disease (HCC)    Bilateral ABIs 0.8  . Renal stones 03/2016  . S/P CABG x 5 04/04/2015   LIMA to LAD, SVG to DIAG, SVG to OM, seqSVG -PDA- dCx  . SOB (shortness of breath)   . 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 Weakness    weakness bilateral legs, s/p vein harvesting for CABPG   Past Surgical History:  Procedure Laterality Date  . 48-HOUR HOLTER MONITOR  02/2018   Sinus rhythm with some sinus bradycardia and sinus tachycardia.  Rare PVCs.  3 episodes of trigeminy for a total of 51 beats.  Frequent PACs no runs, but 3 pairs noted.  12 bigeminal episodes noted.  No arrhythmia.  Minimum heart rate 55 bpm, max 135 bpm. ->  Reportedly had 2 events while wearing monitor, but no obvious abnormality associated  . BLEPHAROPLASTY    . CARDIAC CATHETERIZATION  1992  . CARDIAC CATHETERIZATION N/A 03/31/2015   Procedure: Left Heart Cath  and Coronary Angiography;  Surgeon: Marykay Lexavid W Harding, MD;  [facility]: LM 65%, mLAD 70%, 90% & 80%, o-pCx 75%, mCx 95% & d Cx 70%, OM1 60%, mRCA 90% with 100% dRCA. R-R collaterals --> CABG referral. Also noted to be in Afib.  . Carotid Dopplers  09/2017   Patent vertebrals. Mild bilateral carotid plaque, <50% stenosis bilaterally.  . CHOLECYSTECTOMY  2000   also had revision of hernia repair  . CORONARY ARTERY BYPASS GRAFT N/A 04/04/2015   Procedure: CORONARY ARTERY BYPASS GRAFTING (CABG);  Surgeon: Delight OvensEdward B Gerhardt, MD;  [facility];  Service: Open Heart Surgery;  Laterality: N/A;  . CYSTECTOMY     face and scrotum  . CYSTOSCOPY WITH INSERTION OF UROLIFT N/A 11/12/2016   Procedure: CYSTOSCOPY  WITH INSERTION OF UROLIFT x4;  Surgeon: Jethro Bolusannenbaum, Sigmund, MD;  Location: [facility]S;  Service: Urology;  Laterality: N/A;  . CYSTOSCOPY WITH RETROGRADE PYELOGRAM, URETEROSCOPY AND STENT PLACEMENT Right 06/26/2016   Procedure: CYSTOSCOPY WITH RIGHT RETROGRADE PYELOGRAM, RIGHT URETEROSCOPY , HOLMIUM LASER, BASKET EXTRACTION AND STENT PLACEMENT;  Surgeon: Jethro BolusSigmund Tannenbaum, MD;  Location: [facility];  Service: Urology;  Laterality: Right;  . CYSTOSCOPY WITH RETROGRADE PYELOGRAM, URETEROSCOPY AND STENT PLACEMENT Left 09/23/2017   Procedure: CYSTOSCOPY WITH LEFT RETROGRADE PYELOGRAM, URETEROSCOPY STONE EXTRACTION AND STENT PLACEMENT;  Surgeon: Crista ElliotBell, Eugene D III, MD;  Location: [facility];  Service: Urology;  Laterality: Left;  . LEXISCAN MYOVIEW  June 2010   [facility] inability to reach target heart rate) --the study was normal with no evidence of ischemia and normal LV function. EF 7  . LITHOTRIPSY    . NM MYOVIEW LTD  05/2018   LOW RISK EF 45 to 54%.  Small fixed mild severity defect in the apex which could present either subendocardial infarct versus artifact.  No evidence of ischemia.  . TEE WITHOUT CARDIOVERSION N/A 04/04/2015   Procedure: TRANSESOPHAGEAL ECHOCARDIOGRAM (TEE);  Surgeon: Delight OvensEdward B Gerhardt, MD;  [facility];  Service: Open Heart Surgery;  Laterality: N/A;  . TRANSTHORACIC ECHOCARDIOGRAM  12/30/2016   EF 60-65%. GR 1 DD. Mildly dilated ascending aorta. Otherwise essentially normal with no valve lesions.  Marland Kitchen. VASECTOMY  1975  . VENTRAL HERNIA REPAIR  1994   complicated by infection   Social History   Socioeconomic History  . Marital status: Married    Spouse name: Not on file  . Number of children: 2  . Years of education: 6714  . Highest education level: Not on file  Occupational History  . Not on file  Social Needs  . Financial resource strain: Not hard at all  . Food insecurity:    Worry: Never true    Inability: Never true  . Transportation  needs:    Medical: No    Non-medical: No  Tobacco Use  . Smoking status: Former Smoker    Packs/day: 2.00    Years: 40.00    Pack years: 80.00    Types: Cigarettes    Last attempt to quit: 10/17/1992    Years since quitting: 26.0  . Smokeless tobacco: Never Used  . Tobacco comment: quit 1994  Substance and Sexual Activity  . Alcohol use: Yes    Alcohol/week: 1.0 standard drinks    Types: 1 Standard drinks or equivalent per week    Comment: seldom - vodka or wine  . Drug use: No  . Sexual activity: Never  Lifestyle  . Physical activity:  Days per week: 5 days    Minutes per session: Not on file  . Stress: Not at all  Relationships  . Social connections:    Talks on phone: Not on file    Gets together: Not on file    Attends religious service: Not on file    Active member of club or organization: Not on file    Attends meetings of clubs or organizations: Not on file    Relationship status: Not on file  Other Topics Concern  . Not on file  Social History Narrative   Lives in [place] with wife. No pets   Two children.      Former smoker. Quit in 1994. Prior to quitting was unable to walk without exertional dyspnea.      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      Hobbies - reading, drives Corvette, chuch   No Known Allergies Family History  Problem Relation Age of Onset  . Coronary artery disease Father   . Heart disease Father   . Stroke Mother   . Hearing loss Mother   . Stroke Sister   . Hearing loss Maternal Grandmother   . Prostate cancer Neg Hx   . Kidney disease Neg Hx   . Other Neg Hx        hypogonadism     Current Outpatient Medications (Cardiovascular):  .  isosorbide mononitrate (IMDUR) 30 MG 24 hr tablet, Take 1 tablet (30 mg total) by mouth daily. .  midodrine (PROAMATINE) 5 MG tablet, Take 1 tablet (5 mg total) by mouth 2 (two) times daily with a meal.  Current  Outpatient Medications (Respiratory):  .  albuterol (PROVENTIL HFA;VENTOLIN HFA) 108 (90 Base) MCG/ACT inhaler, Inhale 1-2 puffs into the lungs every 4 (four) hours as needed for wheezing or shortness of breath. .  umeclidinium-vilanterol (ANORO ELLIPTA) 62.5-25 MCG/INH AEPB, Inhale 1 puff into the lungs daily.  Current Outpatient Medications (Analgesics):  .  acetaminophen (TYLENOL) 650 MG CR tablet, Take 650 mg by mouth every 8 (eight) hours as needed for pain.  Current Outpatient Medications (Hematological):  .  clopidogrel (PLAVIX) 75 MG tablet, TAKE 1 TABLET EVERY DAY  Current Outpatient Medications (Other):  Marland Kitchen  ALPRAZolam (XANAX) 0.5 MG tablet, Take 1-2 pills as needed on call to MRI. .  eszopiclone (LUNESTA) 1 MG TABS tablet, Take 1 tablet (1 mg total) by mouth at bedtime as needed for sleep. Take immediately before bedtime .  tamsulosin (FLOMAX) 0.4 MG CAPS capsule, Take 0.4 mg by mouth daily.     Past medical history, social, surgical and family history all reviewed in electronic medical record.  No pertanent information unless stated regarding to the chief complaint.   Review of Systems:  No headache, visual changes, nausea, vomiting, diarrhea, constipation, dizziness, abdominal pain, skin rash, fevers, chills, night sweats, weight loss, swollen lymph nodes, body aches, joint swelling, muscle aches, chest pain, shortness of breath, mood changes.   Objective  Blood pressure 98/60, pulse (!) 101, height 5' 8.5" (1.74 m), weight 206 lb (93.4 kg), SpO2 96 %.    General: No apparent distress alert and oriented x3 mood and affect normal, dressed appropriately.  HEENT: Pupils equal, extraocular movements intact  Respiratory: Patient's speak in full sentences and does not appear short of breath  Cardiovascular: No lower extremity edema, non tender, no erythema  Skin: Warm dry intact with no signs of infection  or rash on extremities or on axial skeleton.  Abdomen: Soft nontender   Neuro: Cranial nerves II through XII are intact, neurovascularly intact in all extremities with 2+ DTRs and 2+ pulses.  Lymph: No lymphadenopathy of posterior or anterior cervical chain or axillae bilaterally.  Gait slow gait MSK: Changes of multiple joints. Bilateral hip that some tenderness to palpation seems to be on the lateral aspect of the hips bilaterally.  More over the greater trochanteric area.  Significant tightness of the hamstrings in the back as well.   Procedure: Real-time Ultrasound Guided Injection of right greater trochanteric bursitis secondary to patient's body habitus Device: GE Logiq Q7 Ultrasound guided injection is preferred based studies that show increased duration, increased effect, greater accuracy, decreased procedural pain, increased response rate, and decreased cost with ultrasound guided versus blind injection.  Verbal informed consent obtained.  Time-out conducted.  Noted no overlying erythema, induration, or other signs of local infection.  Skin prepped in a sterile fashion.  Local anesthesia: Topical Ethyl chloride.  With sterile technique and under real time ultrasound guidance:  Greater trochanteric area was visualized and patient's bursa was noted. A 22-gauge 3 inch needle was inserted and 4 cc of 0.5% Marcaine and 1 cc of Kenalog 40 mg/dL was injected. Pictures taken Completed without difficulty  Pain immediately resolved suggesting accurate placement of the medication.  Advised to call if fevers/chills, erythema, induration, drainage, or persistent bleeding.  Images permanently stored and available for review in the ultrasound unit.  Impression: Technically successful ultrasound guided injection.   Procedure: Real-time Ultrasound Guided Injection of left  greater trochanteric bursitis secondary to patient's body habitus Device: GE Logiq Q7  Ultrasound guided injection is preferred based studies that show increased duration, increased effect, greater  accuracy, decreased procedural pain, increased response rate, and decreased cost with ultrasound guided versus blind injection.  Verbal informed consent obtained.  Time-out conducted.  Noted no overlying erythema, induration, or other signs of local infection.  Skin prepped in a sterile fashion.  Local anesthesia: Topical Ethyl chloride.  With sterile technique and under real time ultrasound guidance:  Greater trochanteric area was visualized and patient's bursa was noted. A 22-gauge 3 inch needle was inserted and 4 cc of 0.5% Marcaine and 1 cc of Kenalog 40 mg/dL was injected. Pictures taken Completed without difficulty  Pain immediately resolved suggesting accurate placement of the medication.  Advised to call if fevers/chills, erythema, induration, drainage, or persistent bleeding.  Images permanently stored and available for review in the ultrasound unit.  Impression: Technically successful ultrasound guided injection.   Impression and Recommendations:     This case required medical decision making of moderate complexity. The above documentation has been reviewed and is accurate and complete Judi Saa, DO       Note: This dictation was prepared with Dragon dictation along with smaller phrase technology. Any transcriptional errors that result from this process are unintentional.

## 2018-10-28 NOTE — Patient Instructions (Signed)
Good to see you  Sorry the injection in the back did not help  Injected both hips again  Send a message on Monday and if not better I will send in for another injection in your back  Stay safe!

## 2018-10-28 NOTE — Assessment & Plan Note (Signed)
Attempted bilateral injections again today.  Do feel that epidural is warranted.  May need potential back surgery.  We discussed patient's previous laboratory work-up as well.  Patient laboratory work-up was fairly unremarkable.  Patient does have a mild shuffling gait that is somewhat concerning.  We did discuss the possibility of work-up for other neurologic issues that could be contributing.  Patient does not want to do that at this time.  Continue with plan and will get back to [place] in 1 week.  If no improvement we will order another epidural.  Discussed with him though I would like him to avoid medical visits with patient being a high risk for coronavirus.

## 2018-10-29 ENCOUNTER — Other Ambulatory Visit: Payer: Self-pay | Admitting: Family Medicine

## 2018-10-29 DIAGNOSIS — G4700 Insomnia, unspecified: Secondary | ICD-10-CM

## 2018-10-29 MED ORDER — ISOSORBIDE MONONITRATE ER 30 MG PO TB24: 30.0000 mg | ORAL_TABLET | Freq: Every day | ORAL | 3 refills | Status: DC

## 2018-10-31 NOTE — Telephone Encounter (Signed)
Refilled: 07/04/2018 Last OV: 08/20/2018 Next OV: 12/19/2018

## 2018-11-01 NOTE — Telephone Encounter (Signed)
Please contact the patient and find out if he has been taking this medication. It looks like it was given as a trial by Leotis Shames.

## 2018-11-03 ENCOUNTER — Encounter: Payer: Self-pay | Admitting: Family Medicine

## 2018-11-03 NOTE — Telephone Encounter (Signed)
Pt stated that he does take this medication but does not take it every night. He stated he only takes it if he has trouble sleeping.

## 2018-11-24 ENCOUNTER — Other Ambulatory Visit: Payer: Self-pay

## 2018-11-24 ENCOUNTER — Ambulatory Visit (INDEPENDENT_AMBULATORY_CARE_PROVIDER_SITE_OTHER): Payer: Medicare Other | Admitting: Family Medicine

## 2018-11-24 ENCOUNTER — Ambulatory Visit: Payer: Self-pay | Admitting: *Deleted

## 2018-11-24 ENCOUNTER — Telehealth: Payer: Self-pay | Admitting: Family Medicine

## 2018-11-24 ENCOUNTER — Encounter: Payer: Self-pay | Admitting: Family Medicine

## 2018-11-24 DIAGNOSIS — Z20822 Contact with and (suspected) exposure to covid-19: Secondary | ICD-10-CM

## 2018-11-24 DIAGNOSIS — R05 Cough: Secondary | ICD-10-CM | POA: Diagnosis not present

## 2018-11-24 DIAGNOSIS — I208 Other forms of angina pectoris: Secondary | ICD-10-CM | POA: Diagnosis not present

## 2018-11-24 DIAGNOSIS — R0600 Dyspnea, unspecified: Secondary | ICD-10-CM

## 2018-11-24 DIAGNOSIS — R42 Dizziness and giddiness: Secondary | ICD-10-CM

## 2018-11-24 DIAGNOSIS — R059 Cough, unspecified: Secondary | ICD-10-CM

## 2018-11-24 DIAGNOSIS — R0609 Other forms of dyspnea: Secondary | ICD-10-CM | POA: Diagnosis not present

## 2018-11-24 DIAGNOSIS — E119 Type 2 diabetes mellitus without complications: Secondary | ICD-10-CM

## 2018-11-24 NOTE — Telephone Encounter (Signed)
I will plan on seeing the patient for his virtual visit.

## 2018-11-24 NOTE — Progress Notes (Signed)
Symptoms started since Feb or early March  Pt c/o feeling very weak,muscel pain, slight cough at times, SOB. NO fever.   C/O a frequent urination BS 155 had some coffee. Last seen neurology in March.   Should pt go back on the metformin - pt has been feeling dizzy and having dizzy spells/ he has cataracts.

## 2018-11-24 NOTE — Assessment & Plan Note (Signed)
This seems to have been going on for several months though he has had increase in symptoms other than the cough over the last week which are concerning for possible coronavirus.  Certainly if he had coronavirus this could explain a number of his symptoms.  We will have available testing sometime this week and I did discuss this with him.  I advised him that we would contact him when I have more details which would be this afternoon.  I discussed strict quarantine precautions for at least 7 days with at least 3 days of improvement in symptoms and 3 days of no fever without use of antipyretics.  Also discussed quarantine precautions for his wife who is asymptomatic currently.  If his testing is negative then we would need to evaluate for causes of his symptoms with additional lab work to include a CK, CMP, TSH, and ESR.  I discussed reasons for him to seek medical attention in the emergency department.

## 2018-11-24 NOTE — Telephone Encounter (Signed)
Pt calling with symptoms of covid-19. he has a headache, cough, feeling light- headed, muscles aches, loss of smell and taste. He stated that every muscle in his body hurts. He is taking  Tylenol Arthritis fo the aches.  He also stated he has a runny nose that the secretions are not clear but thick like glue.  He denies fever now. But a couple of days ago it was up to 99. He is also c/o frequent urination. Wife stated he is drinking lots of water. He would like to tested for coronavirus. Advise of having a virtual visit, pt voiced understanding. Contacted LB at [facility] for an appointment.  Call conference in the patient. Routing to LB at [employer] [facility].  Reason for Disposition . HIGH RISK patient (e.g., age > 64 years, diabetes, heart or lung disease, weak immune system)  Answer Assessment - Initial Assessment Questions 1. COVID-19 DIAGNOSIS: "Who made your Coronavirus (COVID-19) diagnosis?" "Was it confirmed by a positive lab test?" If not diagnosed by a HCP, ask "Are there lots of cases (community spread) where you live?" (See public health department website, if unsure)   * MAJOR community spread: high number of cases; numbers of cases are increasing; many people hospitalized.   * MINOR community spread: low number of cases; not increasing; few or no people hospitalized     In the community 2. ONSET: "When did the COVID-19 symptoms start?"      A week to 10 days 3. WORST SYMPTOM: "What is your worst symptom?" (e.g., cough, fever, shortness of breath, muscle aches)     Cough, muscle aches, shortness of breath 4. COUGH: "Do you have a cough?" If so, ask: "How bad is the cough?"       yes 5. FEVER: "Do you have a fever?" If so, ask: "What is your temperature, how was it measured, and when did it start?"    99 a couple of days ago 6. RESPIRATORY STATUS: "Describe your breathing?" (e.g., shortness of breath, wheezing, unable to speak)      Shortness of breath 7.  BETTER-SAME-WORSE: "Are you getting better, staying the same or getting worse compared to yesterday?"  If getting worse, ask, "In what way?"     Feeling worst this morning 8. HIGH RISK DISEASE: "Do you have any chronic medical problems?" (e.g., asthma, heart or lung disease, weak immune system, etc.)     Open heart surgery about 4 years ago, COPD? 9. PREGNANCY: "Is there any chance you are pregnant?" "When was your last menstrual period?"     no 10. OTHER SYMPTOMS: "Do you have any other symptoms?"  (e.g., runny nose, headache, sore throat, loss of smell)       Headache, loss of smell, runny nose that is thick, loss of taste  Protocols used: CORONAVIRUS (COVID-19) DIAGNOSED OR SUSPECTED-A-AH

## 2018-11-24 NOTE — Progress Notes (Signed)
Virtual Visit via video note  This visit type was conducted due to national recommendations for restrictions regarding the COVID-19 pandemic (e.g. social distancing).  This format is felt to be most appropriate for this patient at this time.  All issues noted in this document were discussed and addressed.  No physical exam was performed (except for noted visual exam findings with Video Visits).   I connected with [name] today at 11:00 AM EDT by a video enabled telemedicine application and verified that I am speaking with the correct person using two identifiers. Location patient: home Location provider: work Persons participating in the virtual visit: patient, provider, Vivian Okelley (wife)  I discussed the limitations, risks, security and privacy concerns of performing an evaluation and management service by telephone and the availability of in person appointments. I also discussed with the patient that there may be a patient responsible charge related to this service. The patient expressed understanding and agreed to proceed.   Reason for visit: Same day visit.  HPI: Cough: Patient notes he has had this since February.  He notes he has worsened over the last week with chills and increased weakness and muscle pain and tenderness.  He notes he feels weak all over.  He has muscle pain.  He has been coughing.  He has had dyspnea on exertion.  He notes no chest pain.  No fevers though has had some chills.  He has been taking Tylenol every 8 hours for his muscle pain.  He feels congested in his chest and does note postnasal drip with thick mucus.  He notes when walking on a flat surface he does okay with breathing though if he walks up an incline he starts to feel tired and short of breath.  He has had no falls.  He has no focal weakness.  He has seen pulmonology and cardiology previously for similar symptoms during frame.  He had PFTs which revealed moderate COPD.  He was tried on Anoro though he  notes there was no benefit.  He had an extensive cardiac evaluation with stress test that did not reveal any evidence of ischemia.  Dizziness: Patient reports this is an ongoing issue.  He did see neurology and they did not have a unifying neurological diagnosis at that time though they did recommend further evaluation which was placed on hold related to COVID-19.  The patient reports he was going to have to be hospitalized to have his MRI due to issues with claustrophobia.  They also wanted to do a sleep study.  He has not seen ENT.  Prediabetes: Patient notes his blood sugar is 155 today.  He has had some increased polyuria.  They are unsure if he has been thirstier than usual.  He was on metformin previously though no longer is taking this.  Cataracts: Patient reports he saw ophthalmology and they diagnosed him with cataracts.  He does have trouble seeing and he is in the process of trying to get a surgery scheduled for this.   ROS: See pertinent positives and negatives per HPI.  Past Medical History:  Diagnosis Date   Arthritis    CAD, multiple vessel 03/2105   LM 65%, mLAD 70%, 90% & 80%, o-pCx 75%, mCx 95% & d Cx 70%, OM1 60%, mRCA 90% with 100% dRCA. R-R collaterals   Chronic distal aortic occlusion (HCC)    By report   Chronic kidney disease    COPD (chronic obstructive pulmonary disease) ([place])  Depression    Diabetes mellitus without complication ([place]) 08/22/2534   Enlarged prostate    Erectile dysfunction    Essential hypertension    History of kidney stones    multiple lithotripsies   Hyperlipidemia LDL goal <70    Hypogonadism in male    Previously on testosterone injections, but has not yet restarted until further testing   Muscle pain    PAF (paroxysmal atrial fibrillation) ([place]) 03/2015   Noted per-cath & post-op CABG.  Dr. 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  Rare PVCs.  3 episodes of trigeminy for a total of 51 beats.  Frequent PACs no runs, but 3 pairs noted.  12 bigeminal episodes noted.  No arrhythmia.  Minimum heart rate 55 bpm, max 135 bpm. ->  Reportedly had 2 events while wearing monitor, but no obvious abnormality associated   Datil N/A 03/31/2015   Procedure: Left Heart Cath and Coronary Angiography;  Surgeon: Leonie Man, MD;  Location: [place] CV LAB: LM 65%, mLAD 70%, 90% & 80%, o-pCx 75%, mCx 95% & d Cx 70%, OM1 60%, mRCA 90% with 100% dRCA. R-R collaterals --> CABG referral. Also noted to be in Afib.   Carotid Dopplers  09/2017   Patent vertebrals. Mild bilateral carotid plaque, <50% stenosis bilaterally.   CHOLECYSTECTOMY  2000   also had revision of hernia repair   CORONARY ARTERY BYPASS GRAFT N/A 04/04/2015   Procedure: CORONARY ARTERY BYPASS GRAFTING (CABG);  Surgeon: Grace Isaac, MD;  Location: [place];  Service: Open Heart Surgery;  Laterality: N/A;   CYSTECTOMY     face and scrotum   CYSTOSCOPY WITH INSERTION OF UROLIFT N/A 11/12/2016   Procedure: CYSTOSCOPY WITH INSERTION OF UROLIFT x4;  Surgeon: Carolan Clines, MD;  Location: [facility];  Service: Urology;  Laterality: N/A;   CYSTOSCOPY WITH RETROGRADE PYELOGRAM, URETEROSCOPY AND STENT PLACEMENT Right 06/26/2016   Procedure: CYSTOSCOPY WITH RIGHT RETROGRADE PYELOGRAM, RIGHT URETEROSCOPY , HOLMIUM LASER, BASKET EXTRACTION AND STENT PLACEMENT;  Surgeon: Carolan Clines, MD;  Location: [facility];   Service: Urology;  Laterality: Right;   CYSTOSCOPY WITH RETROGRADE PYELOGRAM, URETEROSCOPY AND STENT PLACEMENT Left 09/23/2017   Procedure: CYSTOSCOPY WITH LEFT RETROGRADE PYELOGRAM, URETEROSCOPY STONE EXTRACTION AND STENT PLACEMENT;  Surgeon: Lucas Mallow, MD;  Location: [facility];  Service: Urology;  Laterality: Left;   LEXISCAN MYOVIEW  June 2010   converted to [facility] from treadmill due to inability to reach target heart rate) --the study was normal with no evidence of ischemia and normal LV function. EF 7   LITHOTRIPSY     NM MYOVIEW LTD  05/2018   LOW RISK EF 45 to 54%.  Small fixed mild severity defect in the apex which could present either subendocardial infarct versus artifact.  No evidence of ischemia.   TEE WITHOUT CARDIOVERSION N/A 04/04/2015  Procedure: TRANSESOPHAGEAL ECHOCARDIOGRAM (TEE);  Surgeon: Grace Isaac, MD;  Location: [place];  Service: Open Heart Surgery;  Laterality: N/A;   TRANSTHORACIC ECHOCARDIOGRAM  12/30/2016   EF 60-65%. GR 1 DD. Mildly dilated ascending aorta. Otherwise essentially normal with no valve lesions.   VASECTOMY  1975   VENTRAL HERNIA REPAIR  8786   complicated by infection    Family History  Problem Relation Age of Onset   Coronary artery disease Father    Heart disease Father    Stroke Mother    Hearing loss Mother    Stroke Sister    Hearing loss Maternal Grandmother    Prostate cancer Neg Hx    Kidney disease Neg Hx    Other Neg Hx        hypogonadism    SOCIAL HX: Former smoker   Current Outpatient Medications:    acetaminophen (TYLENOL) 650 MG CR tablet, Take 650 mg by mouth every 8 (eight) hours as needed for pain., Disp: , Rfl:    albuterol (PROVENTIL HFA;VENTOLIN HFA) 108 (90 Base) MCG/ACT inhaler, Inhale 1-2 puffs into the lungs every 4 (four) hours as needed for wheezing or shortness of breath., Disp: 1 Inhaler, Rfl: 10   ALPRAZolam (XANAX) 0.5 MG tablet, Take 1-2 pills as needed on call to MRI.,  Disp: 2 tablet, Rfl: 0   clopidogrel (PLAVIX) 75 MG tablet, TAKE 1 TABLET EVERY DAY, Disp: 90 tablet, Rfl: 3   eszopiclone (LUNESTA) 1 MG TABS tablet, TAKE 1 TABLET (1 MG TOTAL) BY MOUTH AT BEDTIME AS NEEDED FOR SLEEP. TAKE IMMEDIATELY BEFORE BEDTIME, Disp: 30 tablet, Rfl: 0   isosorbide mononitrate (IMDUR) 30 MG 24 hr tablet, Take 1 tablet (30 mg total) by mouth daily., Disp: 90 tablet, Rfl: 3   midodrine (PROAMATINE) 5 MG tablet, Take 1 tablet (5 mg total) by mouth 2 (two) times daily with a meal., Disp: 180 tablet, Rfl: 1   tamsulosin (FLOMAX) 0.4 MG CAPS capsule, Take 0.4 mg by mouth daily. , Disp: , Rfl:    umeclidinium-vilanterol (ANORO ELLIPTA) 62.5-25 MCG/INH AEPB, Inhale 1 puff into the lungs daily., Disp: 14 each, Rfl: 0  EXAM:  VITALS per patient if applicable: None  GENERAL: alert, oriented, appears well and in no acute distress  HEENT: atraumatic, conjunttiva clear, no obvious abnormalities on inspection of external nose and ears  NECK: normal movements of the head and neck  LUNGS: on inspection no signs of respiratory distress, breathing rate appears normal, no obvious gross SOB, gasping or wheezing  CV: no obvious cyanosis  MS: moves all visible extremities without noticeable abnormality  PSYCH/NEURO: pleasant and cooperative, no obvious depression or anxiety, speech and thought processing grossly intact  ASSESSMENT AND PLAN:  Discussed the following assessment and plan:  Cough  DOE (dyspnea on exertion)  Diabetes mellitus without complication (HCC)  Dizziness of unknown cause  Cough This seems to have been going on for several months though he has had increase in symptoms other than the cough over the last week which are concerning for possible coronavirus.  Certainly if he had coronavirus this could explain a number of his symptoms.  We will have available testing sometime this week and I did discuss this with him.  I advised him that we would contact him  when I have more details which would be this afternoon.  I discussed strict quarantine precautions for at least 7 days with at least 3 days of improvement in symptoms and 3 days of no  fever without use of antipyretics.  Also discussed quarantine precautions for his wife who is asymptomatic currently.  If his testing is negative then we would need to evaluate for causes of his symptoms with additional lab work to include a CK, CMP, TSH, and ESR.  I discussed reasons for him to seek medical attention in the emergency department.  DOE (dyspnea on exertion) This could be related to a more acute infectious issue though could also be related to his COPD.  He has undergone evaluation with cardiology which ruled out ischemia.  We will get him tested for COVID-19 and then consider further evaluation moving forward.  Diabetes mellitus without complication ([place]) Prediabetic previously.  Has had increase in polyuria.  We will get him in for lab work once his COVID-19 test returns when we are able to get that completed.  Dizziness of unknown cause This does seem to be a hard diagnosis to pin down.  He has seen neurology and there is no obvious cause noted in their note.  I did discuss the importance of him completing his evaluation through them at some point when it is safe to do so.  I also discussed that we could have him see ENT for evaluation of a potential vertiginous cause.  I will additionally send my note to his neurologist to see what their thoughts are on further evaluation for him.    I discussed the assessment and treatment plan with the patient. The patient was provided an opportunity to ask questions and all were answered. The patient agreed with the plan and demonstrated an understanding of the instructions.   The patient was advised to call back or seek an in-person evaluation if the symptoms worsen or if the condition fails to improve as anticipated.   [name], MD

## 2018-11-24 NOTE — Assessment & Plan Note (Signed)
This does seem to be a hard diagnosis to pin down.  He has seen neurology and there is no obvious cause noted in their note.  I did discuss the importance of him completing his evaluation through them at some point when it is safe to do so.  I also discussed that we could have him see ENT for evaluation of a potential vertiginous cause.  I will additionally send my note to his neurologist to see what their thoughts are on further evaluation for him.

## 2018-11-24 NOTE — Assessment & Plan Note (Signed)
Prediabetic previously.  Has had increase in polyuria.  We will get him in for lab work once his COVID-19 test returns when we are able to get that completed.

## 2018-11-24 NOTE — Assessment & Plan Note (Signed)
This could be related to a more acute infectious issue though could also be related to his COPD.  He has undergone evaluation with cardiology which ruled out ischemia.  We will get him tested for COVID-19 and then consider further evaluation moving forward.

## 2018-11-24 NOTE — Telephone Encounter (Signed)
Please contact the patient and let him know that testing for COVID-19 will be available starting Wednesday in [facility].  I will forward to [place] as well we will try to get him set up for testing when it is available.  Thanks.

## 2018-11-25 ENCOUNTER — Telehealth: Payer: Self-pay | Admitting: Family Medicine

## 2018-11-25 NOTE — Telephone Encounter (Signed)
Opened in error

## 2018-11-25 NOTE — Telephone Encounter (Signed)
Notified patient testing will be available starting Wednesday in [facility] will advise as soon as we have availability.

## 2018-11-26 ENCOUNTER — Telehealth: Payer: Self-pay | Admitting: Family Medicine

## 2018-11-26 NOTE — Telephone Encounter (Signed)
Call placed to patient. Appointment created for COVID-19 testing.

## 2018-11-26 NOTE — Telephone Encounter (Signed)
Spoke with patient and advised message has been sent for testing to the [facility] community testing pool.

## 2018-11-26 NOTE — Telephone Encounter (Signed)
Please contact the patient to try to get him scheduled for testing. Thanks.

## 2018-11-26 NOTE — Telephone Encounter (Signed)
Copied from CRM [id number]. Topic: Quick Communication - See Telephone Encounter >> Nov 26, 2018  3:27 PM Trula Slade wrote: CRM for notification. See Telephone encounter for: 11/26/18. Patient stated he had a virtual appt with the provider on Mon 11/24/2018 and he was suppose to be set up to be tested for the COVID virus, but he has not heard anything.  Please advise.

## 2018-11-26 NOTE — Telephone Encounter (Signed)
Patient is at High risk for complications for COVID seen by virtual by PCP believed to be COVID positive.with symptoms and with COVID score risk of 8 .

## 2018-11-26 NOTE — Telephone Encounter (Signed)
Patient called, phone kept ringing, no VM set up.

## 2018-11-26 NOTE — Addendum Note (Signed)
Addended by: Tonny Bollman A on: 11/26/2018 04:08 PM   Modules accepted: Orders

## 2018-11-27 ENCOUNTER — Other Ambulatory Visit: Payer: Medicare Other

## 2018-11-27 DIAGNOSIS — R6889 Other general symptoms and signs: Secondary | ICD-10-CM | POA: Diagnosis not present

## 2018-11-27 DIAGNOSIS — Z20822 Contact with and (suspected) exposure to covid-19: Secondary | ICD-10-CM

## 2018-11-27 NOTE — Telephone Encounter (Signed)
Pt was tested this morning.

## 2018-12-01 ENCOUNTER — Other Ambulatory Visit: Payer: Self-pay | Admitting: Family Medicine

## 2018-12-01 LAB — NOVEL CORONAVIRUS, NAA: SARS-CoV-2, NAA: NOT DETECTED

## 2018-12-03 ENCOUNTER — Telehealth: Payer: Self-pay | Admitting: Family Medicine

## 2018-12-03 DIAGNOSIS — R350 Frequency of micturition: Secondary | ICD-10-CM | POA: Diagnosis not present

## 2018-12-03 DIAGNOSIS — R351 Nocturia: Secondary | ICD-10-CM | POA: Diagnosis not present

## 2018-12-03 NOTE — Telephone Encounter (Signed)
Pt called back returning your call.   Call pt @ [phone]. Thank you!

## 2018-12-11 ENCOUNTER — Other Ambulatory Visit: Payer: Self-pay | Admitting: Family Medicine

## 2018-12-11 ENCOUNTER — Encounter: Payer: Self-pay | Admitting: *Deleted

## 2018-12-11 DIAGNOSIS — R42 Dizziness and giddiness: Secondary | ICD-10-CM

## 2018-12-19 ENCOUNTER — Ambulatory Visit (INDEPENDENT_AMBULATORY_CARE_PROVIDER_SITE_OTHER): Payer: Medicare Other | Admitting: Family Medicine

## 2018-12-19 ENCOUNTER — Encounter: Payer: Self-pay | Admitting: Family Medicine

## 2018-12-19 ENCOUNTER — Other Ambulatory Visit: Payer: Self-pay

## 2018-12-19 DIAGNOSIS — N3281 Overactive bladder: Secondary | ICD-10-CM | POA: Diagnosis not present

## 2018-12-19 DIAGNOSIS — M791 Myalgia, unspecified site: Secondary | ICD-10-CM | POA: Diagnosis not present

## 2018-12-19 DIAGNOSIS — R42 Dizziness and giddiness: Secondary | ICD-10-CM | POA: Diagnosis not present

## 2018-12-19 DIAGNOSIS — R5382 Chronic fatigue, unspecified: Secondary | ICD-10-CM | POA: Diagnosis not present

## 2018-12-19 DIAGNOSIS — I208 Other forms of angina pectoris: Secondary | ICD-10-CM | POA: Diagnosis not present

## 2018-12-19 NOTE — Assessment & Plan Note (Signed)
Lab evaluation to be completed.  Refer to rheumatology.

## 2018-12-19 NOTE — Assessment & Plan Note (Addendum)
This continues to be an issue.  He has myalgias associated with this.  We will check additional lab work.  We will refer to rheumatology.  Home sleep study ordered.  He will continue to see neurology.  He will discontinue Lunesta.  Follow-up in 1 month.

## 2018-12-19 NOTE — Assessment & Plan Note (Signed)
Patient will keep his appointment with ENT and see neurology as planned.

## 2018-12-19 NOTE — Progress Notes (Signed)
Virtual Visit via video Note  This visit type was conducted due to national recommendations for restrictions regarding the COVID-19 pandemic (e.g. social distancing).  This format is felt to be most appropriate for this patient at this time.  All issues noted in this document were discussed and addressed.  No physical exam was performed (except for noted visual exam findings with Video Visits).   I connected with [name] today at 10:30 AM EDT by a video enabled telemedicine application and verified that I am speaking with the correct person using two identifiers. Location patient: home Location provider: work  Persons participating in the virtual visit: patient, provider, Maurizio Geno (wife)  I discussed the limitations, risks, security and privacy concerns of performing an evaluation and management service by telephone and the availability of in person appointments. I also discussed with the patient that there may be a patient responsible charge related to this service. The patient expressed understanding and agreed to proceed.   Reason for visit: Follow-up.  HPI: Overactive bladder: Patient did see urology and was placed on tolterodine to help with his overactive bladder and urinary frequency.  He notes this was not very beneficial.  He notes he had side effects related to this medication where he would wake up with his sinuses so clogged up and dried out that he would wake up gasping for air and have trouble breathing.  He discontinued this medication and those issues have resolved.  Dizziness: This has continued to be an issue.  He notes it is unchanged from prior.  He has not seen ENT yet though he will see them in the next week or so.  Fatigue: Patient has continued to have issues with fatigue.  He has muscle aches all over.  These things have been going on for a number of months and seem to be progressively worsening.  He notes he is fine if he walks on a flat surface though he  becomes quite fatigued if he goes up an incline.  He has seen cardiology and pulmonology for this.  He has also seen neurology and has not been able to complete the full evaluation through them related to claustrophobia issues as well as the COVID-19 pandemic.  He is no longer on a statin.   ROS: See pertinent positives and negatives per HPI.  Past Medical History:  Diagnosis Date  . Arthritis   . CAD, multiple vessel 03/2105   LM 65%, mLAD 70%, 90% & 80%, o-pCx 75%, mCx 95% & d Cx 70%, OM1 60%, mRCA 90% with 100% dRCA. R-R collaterals  . Chronic distal aortic occlusion (HCC)    By report  . Chronic kidney disease   . COPD (chronic obstructive pulmonary disease) ([place])   . Depression   . Diabetes mellitus without complication ([place]) 08/24/7679  . Enlarged prostate   . Erectile dysfunction   . Essential hypertension   . History of kidney stones    multiple lithotripsies  . Hyperlipidemia LDL goal <70   . Hypogonadism in male    Previously on testosterone injections, but has not yet restarted until further testing  . Muscle pain   . PAF (paroxysmal atrial fibrillation) ([place]) 03/2015   Noted per-cath & post-op CABG.  Dr. Ellyn Hack, cardiology  . Peripheral vascular disease (HCC)    Bilateral ABIs 0.8  . Renal stones 03/2016  . S/P CABG x 5 04/04/2015   LIMA to LAD, SVG to DIAG, SVG to OM, seqSVG -PDA- dCx  . SOB (  shortness of breath)   . 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 Weakness    weakness bilateral legs, s/p vein harvesting for CABPG    Past Surgical History:  Procedure Laterality Date  . 48-HOUR HOLTER MONITOR  02/2018   Sinus rhythm with some sinus bradycardia and sinus tachycardia.  Rare PVCs.  3 episodes of trigeminy for a total of 51 beats.  Frequent PACs no runs, but 3 pairs noted.  12 bigeminal episodes noted.  No arrhythmia.  Minimum heart rate 55 bpm, max 135 bpm. ->  Reportedly had 2 events while wearing monitor, but no  obvious abnormality associated  . BLEPHAROPLASTY    . CARDIAC CATHETERIZATION  1992  . CARDIAC CATHETERIZATION N/A 03/31/2015   Procedure: Left Heart Cath and Coronary Angiography;  Surgeon: Leonie Man, MD;  Location: [place] CV LAB: LM 65%, mLAD 70%, 90% & 80%, o-pCx 75%, mCx 95% & d Cx 70%, OM1 60%, mRCA 90% with 100% dRCA. R-R collaterals --> CABG referral. Also noted to be in Afib.  . Carotid Dopplers  09/2017   Patent vertebrals. Mild bilateral carotid plaque, <50% stenosis bilaterally.  . CHOLECYSTECTOMY  2000   also had revision of hernia repair  . CORONARY ARTERY BYPASS GRAFT N/A 04/04/2015   Procedure: CORONARY ARTERY BYPASS GRAFTING (CABG);  Surgeon: Grace Isaac, MD;  Location: [facility];  Service: Open Heart Surgery;  Laterality: N/A;  . CYSTECTOMY     face and scrotum  . CYSTOSCOPY WITH INSERTION OF UROLIFT N/A 11/12/2016   Procedure: CYSTOSCOPY WITH INSERTION OF UROLIFT x4;  Surgeon: Carolan Clines, MD;  [facility];  Service: Urology;  Laterality: N/A;  . CYSTOSCOPY WITH RETROGRADE PYELOGRAM, URETEROSCOPY AND STENT PLACEMENT Right 06/26/2016   Procedure: CYSTOSCOPY WITH RIGHT RETROGRADE PYELOGRAM, RIGHT URETEROSCOPY , HOLMIUM LASER, BASKET EXTRACTION AND STENT PLACEMENT;  Surgeon: Carolan Clines, MD;  [facility];  Service: Urology;  Laterality: Right;  . CYSTOSCOPY WITH RETROGRADE PYELOGRAM, URETEROSCOPY AND STENT PLACEMENT Left 09/23/2017   Procedure: CYSTOSCOPY WITH LEFT RETROGRADE PYELOGRAM, URETEROSCOPY STONE EXTRACTION AND STENT PLACEMENT;  Surgeon: Lucas Mallow, MD;  [facility];  Service: Urology;  Laterality: Left;  . LEXISCAN MYOVIEW  June 2010   converted to [facility] from treadmill due to inability to reach target heart rate) --the study was normal with no evidence of ischemia and normal LV function. EF 7  . LITHOTRIPSY    . NM MYOVIEW LTD  05/2018   LOW RISK EF 45 to 54%.  Small fixed mild severity defect in the apex which could  present either subendocardial infarct versus artifact.  No evidence of ischemia.  . TEE WITHOUT CARDIOVERSION N/A 04/04/2015   Procedure: TRANSESOPHAGEAL ECHOCARDIOGRAM (TEE);  Surgeon: Grace Isaac, MD;  Location: [facility];  Service: Open Heart Surgery;  Laterality: N/A;  . TRANSTHORACIC ECHOCARDIOGRAM  12/30/2016   EF 60-65%. GR 1 DD. Mildly dilated ascending aorta. Otherwise essentially normal with no valve lesions.  Marland Kitchen VASECTOMY  1975  . VENTRAL HERNIA REPAIR  1829   complicated by infection    Family History  Problem Relation Age of Onset  . Coronary artery disease Father   . Heart disease Father   . Stroke Mother   . Hearing loss Mother   . Stroke Sister   . Hearing loss Maternal Grandmother   . Prostate cancer Neg Hx   . Kidney disease Neg Hx   . Other Neg Hx  hypogonadism    SOCIAL HX: Former smoker   Current Outpatient Medications:  .  acetaminophen (TYLENOL) 650 MG CR tablet, Take 650 mg by mouth every 8 (eight) hours as needed for pain., Disp: , Rfl:  .  albuterol (PROVENTIL HFA;VENTOLIN HFA) 108 (90 Base) MCG/ACT inhaler, Inhale 1-2 puffs into the lungs every 4 (four) hours as needed for wheezing or shortness of breath., Disp: 1 Inhaler, Rfl: 10 .  ALPRAZolam (XANAX) 0.5 MG tablet, Take 1-2 pills as needed on call to MRI., Disp: 2 tablet, Rfl: 0 .  clopidogrel (PLAVIX) 75 MG tablet, TAKE 1 TABLET EVERY DAY, Disp: 90 tablet, Rfl: 3 .  eszopiclone (LUNESTA) 1 MG TABS tablet, TAKE 1 TABLET (1 MG TOTAL) BY MOUTH AT BEDTIME AS NEEDED FOR SLEEP. TAKE IMMEDIATELY BEFORE BEDTIME, Disp: 30 tablet, Rfl: 0 .  isosorbide mononitrate (IMDUR) 30 MG 24 hr tablet, Take 1 tablet (30 mg total) by mouth daily., Disp: 90 tablet, Rfl: 3 .  midodrine (PROAMATINE) 5 MG tablet, Take 1 tablet (5 mg total) by mouth 2 (two) times daily with a meal., Disp: 180 tablet, Rfl: 1 .  tamsulosin (FLOMAX) 0.4 MG CAPS capsule, Take 0.4 mg by mouth daily. , Disp: , Rfl:   EXAM:  VITALS per patient  if applicable: None.  GENERAL: alert, oriented, appears well and in no acute distress  HEENT: atraumatic, conjunttiva clear, no obvious abnormalities on inspection of external nose and ears  NECK: normal movements of the head and neck  LUNGS: on inspection no signs of respiratory distress, breathing rate appears normal, no obvious gross SOB, gasping or wheezing  CV: no obvious cyanosis  MS: moves all visible extremities without noticeable abnormality  PSYCH/NEURO: pleasant and cooperative, no obvious depression or anxiety, speech and thought processing grossly intact  ASSESSMENT AND PLAN:  Discussed the following assessment and plan:  Myalgia - Plan: Ambulatory referral to Rheumatology, Comp Met (CMET), CK (Creatine Kinase), Antinuclear Antib (ANA), POCT Urinalysis Dipstick, Cortisol, CBC with Differential/Platelet  Chronic fatigue - Plan: Home sleep test  Dizziness of unknown cause  Overactive bladder  Chronic fatigue This continues to be an issue.  He has myalgias associated with this.  We will check additional lab work.  We will refer to rheumatology.  Home sleep study ordered.  He will continue to see neurology.  He will discontinue Lunesta.  Follow-up in 1 month.  Dizziness of unknown cause Patient will keep his appointment with ENT and see neurology as planned.  Overactive bladder Patient will remain off of tolterodine.  I suspect this was the cause of his symptoms.  He will continue to see urology.  Myalgia Lab evaluation to be completed.  Refer to rheumatology.  [place] office staff will contact the patient to get him scheduled for labs and follow-up.   I discussed the assessment and treatment plan with the patient. The patient was provided an opportunity to ask questions and all were answered. The patient agreed with the plan and demonstrated an understanding of the instructions.   The patient was advised to call back or seek an in-person evaluation if the symptoms  worsen or if the condition fails to improve as anticipated.   [name], MD

## 2018-12-19 NOTE — Assessment & Plan Note (Addendum)
Patient will remain off of tolterodine.  I suspect this was the cause of his symptoms.  He will continue to see urology.

## 2018-12-22 ENCOUNTER — Telehealth: Payer: Self-pay

## 2018-12-22 NOTE — Telephone Encounter (Signed)
Pt has been called twice to schedule in lab sleep study. Pt is still not interested in scheduling at this time. Pt was told to call us back when he has changed his mind.

## 2018-12-25 DIAGNOSIS — R42 Dizziness and giddiness: Secondary | ICD-10-CM | POA: Diagnosis not present

## 2018-12-25 DIAGNOSIS — H903 Sensorineural hearing loss, bilateral: Secondary | ICD-10-CM | POA: Diagnosis not present

## 2018-12-26 ENCOUNTER — Other Ambulatory Visit (INDEPENDENT_AMBULATORY_CARE_PROVIDER_SITE_OTHER): Payer: Medicare Other

## 2018-12-26 ENCOUNTER — Other Ambulatory Visit: Payer: Self-pay

## 2018-12-26 DIAGNOSIS — M791 Myalgia, unspecified site: Secondary | ICD-10-CM | POA: Diagnosis not present

## 2018-12-26 LAB — CBC WITH DIFFERENTIAL/PLATELET
Basophils Absolute: 0.1 10*3/uL (ref 0.0–0.1)
Basophils Relative: 0.7 % (ref 0.0–3.0)
Eosinophils Absolute: 0.1 10*3/uL (ref 0.0–0.7)
Eosinophils Relative: 0.8 % (ref 0.0–5.0)
HCT: 36.7 % — ABNORMAL LOW (ref 39.0–52.0)
Hemoglobin: 12.5 g/dL — ABNORMAL LOW (ref 13.0–17.0)
Lymphocytes Relative: 25.9 % (ref 12.0–46.0)
Lymphs Abs: 2.1 10*3/uL (ref 0.7–4.0)
MCHC: 34.1 g/dL (ref 30.0–36.0)
MCV: 97.2 fl (ref 78.0–100.0)
Monocytes Absolute: 0.8 10*3/uL (ref 0.1–1.0)
Monocytes Relative: 9.8 % (ref 3.0–12.0)
Neutro Abs: 5 10*3/uL (ref 1.4–7.7)
Neutrophils Relative %: 62.8 % (ref 43.0–77.0)
Platelets: 194 10*3/uL (ref 150.0–400.0)
RBC: 3.78 Mil/uL — ABNORMAL LOW (ref 4.22–5.81)
RDW: 13 % (ref 11.5–15.5)
WBC: 7.9 10*3/uL (ref 4.0–10.5)

## 2018-12-26 LAB — POCT URINALYSIS DIPSTICK
Bilirubin, UA: NEGATIVE
Blood, UA: NEGATIVE
Glucose, UA: NEGATIVE
Ketones, UA: NEGATIVE
Leukocytes, UA: NEGATIVE
Nitrite, UA: NEGATIVE
Protein, UA: NEGATIVE
Spec Grav, UA: 1.015 (ref 1.010–1.025)
Urobilinogen, UA: 0.2 E.U./dL
pH, UA: 5 (ref 5.0–8.0)

## 2018-12-26 LAB — COMPREHENSIVE METABOLIC PANEL
ALT: 14 U/L (ref 0–53)
AST: 11 U/L (ref 0–37)
Albumin: 3.7 g/dL (ref 3.5–5.2)
Alkaline Phosphatase: 52 U/L (ref 39–117)
BUN: 30 mg/dL — ABNORMAL HIGH (ref 6–23)
CO2: 23 mEq/L (ref 19–32)
Calcium: 8.6 mg/dL (ref 8.4–10.5)
Chloride: 104 mEq/L (ref 96–112)
Creatinine, Ser: 1.25 mg/dL (ref 0.40–1.50)
GFR: 56.39 mL/min — ABNORMAL LOW (ref >60.00)
Glucose, Bld: 134 mg/dL — ABNORMAL HIGH (ref 70–99)
Potassium: 4.3 mEq/L (ref 3.5–5.1)
Sodium: 136 mEq/L (ref 135–145)
Total Bilirubin: 0.4 mg/dL (ref 0.2–1.2)
Total Protein: 6 g/dL (ref 6.0–8.3)

## 2018-12-26 LAB — CORTISOL: Cortisol, Plasma: 3.1 ug/dL

## 2018-12-26 LAB — CK: Total CK: 31 U/L (ref 7–232)

## 2018-12-29 LAB — ANA: Anti Nuclear Antibody (ANA): NEGATIVE

## 2019-01-01 ENCOUNTER — Ambulatory Visit: Payer: Medicare Other | Admitting: Neurology

## 2019-01-01 ENCOUNTER — Other Ambulatory Visit: Payer: Self-pay | Admitting: Family Medicine

## 2019-01-01 ENCOUNTER — Telehealth: Payer: Self-pay | Admitting: *Deleted

## 2019-01-01 DIAGNOSIS — E119 Type 2 diabetes mellitus without complications: Secondary | ICD-10-CM

## 2019-01-01 DIAGNOSIS — D649 Anemia, unspecified: Secondary | ICD-10-CM

## 2019-01-01 NOTE — Telephone Encounter (Signed)
opened in error

## 2019-01-01 NOTE — Progress Notes (Signed)
c 

## 2019-01-06 DIAGNOSIS — Z13820 Encounter for screening for osteoporosis: Secondary | ICD-10-CM | POA: Insufficient documentation

## 2019-01-06 DIAGNOSIS — M5136 Other intervertebral disc degeneration, lumbar region: Secondary | ICD-10-CM | POA: Diagnosis not present

## 2019-01-06 DIAGNOSIS — M791 Myalgia, unspecified site: Secondary | ICD-10-CM | POA: Diagnosis not present

## 2019-01-06 DIAGNOSIS — M7061 Trochanteric bursitis, right hip: Secondary | ICD-10-CM | POA: Diagnosis not present

## 2019-01-07 ENCOUNTER — Other Ambulatory Visit: Payer: Self-pay | Admitting: Family Medicine

## 2019-01-07 ENCOUNTER — Other Ambulatory Visit (INDEPENDENT_AMBULATORY_CARE_PROVIDER_SITE_OTHER): Payer: Medicare Other

## 2019-01-07 ENCOUNTER — Other Ambulatory Visit: Payer: Self-pay

## 2019-01-07 DIAGNOSIS — D649 Anemia, unspecified: Secondary | ICD-10-CM | POA: Diagnosis not present

## 2019-01-07 DIAGNOSIS — E119 Type 2 diabetes mellitus without complications: Secondary | ICD-10-CM | POA: Diagnosis not present

## 2019-01-07 DIAGNOSIS — M791 Myalgia, unspecified site: Secondary | ICD-10-CM

## 2019-01-07 DIAGNOSIS — Z13820 Encounter for screening for osteoporosis: Secondary | ICD-10-CM | POA: Diagnosis not present

## 2019-01-07 LAB — CBC
HCT: 38 % — ABNORMAL LOW (ref 39.0–52.0)
Hemoglobin: 12.8 g/dL — ABNORMAL LOW (ref 13.0–17.0)
MCHC: 33.8 g/dL (ref 30.0–36.0)
MCV: 97.4 fl (ref 78.0–100.0)
Platelets: 195 10*3/uL (ref 150.0–400.0)
RBC: 3.9 Mil/uL — ABNORMAL LOW (ref 4.22–5.81)
RDW: 13.2 % (ref 11.5–15.5)
WBC: 7.2 10*3/uL (ref 4.0–10.5)

## 2019-01-07 LAB — IBC + FERRITIN
Ferritin: 198 ng/mL (ref 22.0–322.0)
Iron: 93 ug/dL (ref 42–165)
Saturation Ratios: 30.8 % (ref 20.0–50.0)
Transferrin: 216 mg/dL (ref 212.0–360.0)

## 2019-01-07 LAB — HEMOGLOBIN A1C: Hgb A1c MFr Bld: 6.7 % — ABNORMAL HIGH (ref 4.6–6.5)

## 2019-01-07 NOTE — Addendum Note (Signed)
Addended by: Leeanne Rio on: 01/07/2019 10:59 AM   Modules accepted: Orders

## 2019-01-09 ENCOUNTER — Telehealth: Payer: Self-pay | Admitting: Internal Medicine

## 2019-01-09 LAB — VITAMIN D 25 HYDROXY (VIT D DEFICIENCY, FRACTURES): Vit D, 25-Hydroxy: 26.6 ng/mL — ABNORMAL LOW (ref 30.0–100.0)

## 2019-01-09 LAB — IFE, PE AND FLC, SERUM
Ig Kappa Free Light Chain: 19.7 mg/L — ABNORMAL HIGH (ref 3.3–19.4)
Ig Lambda Free Light Chain: 10.2 mg/L (ref 5.7–26.3)
IgA/Immunoglobulin A, Serum: 161 mg/dL (ref 61–437)
IgG (Immunoglobin G), Serum: 902 mg/dL (ref 603–1613)
IgM (Immunoglobulin M), Srm: 37 mg/dL (ref 15–143)
Kappa/Lambda FluidC Ratio: 1.93 — ABNORMAL HIGH (ref 0.26–1.65)
Total Protein: 6.3 g/dL (ref 6.0–8.5)

## 2019-01-09 LAB — ALDOLASE

## 2019-01-09 LAB — CK: Total CK: 36 U/L — ABNORMAL LOW (ref 41–331)

## 2019-01-09 NOTE — Telephone Encounter (Signed)

## 2019-01-12 ENCOUNTER — Ambulatory Visit: Payer: Medicare Other | Admitting: Pulmonary Disease

## 2019-01-13 DIAGNOSIS — R0602 Shortness of breath: Secondary | ICD-10-CM | POA: Diagnosis not present

## 2019-01-13 DIAGNOSIS — G4733 Obstructive sleep apnea (adult) (pediatric): Secondary | ICD-10-CM | POA: Diagnosis not present

## 2019-01-14 ENCOUNTER — Telehealth: Payer: Self-pay | Admitting: Family Medicine

## 2019-01-14 ENCOUNTER — Encounter

## 2019-01-14 ENCOUNTER — Ambulatory Visit: Payer: Medicare Other | Admitting: Family Medicine

## 2019-01-14 DIAGNOSIS — R0602 Shortness of breath: Secondary | ICD-10-CM | POA: Diagnosis not present

## 2019-01-14 DIAGNOSIS — G4733 Obstructive sleep apnea (adult) (pediatric): Secondary | ICD-10-CM | POA: Diagnosis not present

## 2019-01-14 NOTE — Telephone Encounter (Signed)
Rescheduling appointment noticed symptoms listed fatigue, dizziness, called spoke with patient he said thisiis on going and PCP is aware of his weakness, fatigue, and dizziness, patient denies SOB or any other symptoms . Tried to get patient to schedule with another provider he refused, stating only wants to see PCP , advised symptoms worsen he needs to be evaluated immediately, patient stated he would .  Schedule next available.

## 2019-01-14 NOTE — Progress Notes (Signed)
Tawana ScaleZach Jacklyn [name] D.O. [facility] [address] [place], [place] [address] Phone: [phone] Subjective:   [name], [name], am serving as a scribe for Dr. Antoine PrimasZachary Pastor Sgro.   CC: Bilateral hip pain  BJY:NWGNFAOZHYHPI:Subjective   10/28/2018: Marc HartFrank [name] is a 73 y.o. male coming in with complaint of bilateral hip pain. Patient states that pain is over greater trochanter. Would like injections today. Last injected on 10/28/2018. Patient is having worsening pain again.  Waking him up at night.  Lateral aspects of the hips bilaterally.  Patient states minimal back pain.  Patient denies any significant radiation down the legs at the moment.  States it did get near complete resolution of pain for some time after the last injections but now they are starting to worsen again and wake him up at night.     Past Medical History:  Diagnosis Date  . Arthritis   . CAD, multiple vessel 03/2105   LM 65%, mLAD 70%, 90% & 80%, o-pCx 75%, mCx 95% & d Cx 70%, OM1 60%, mRCA 90% with 100% dRCA. R-R collaterals  . Chronic distal aortic occlusion (HCC)    By report  . Chronic kidney disease   . COPD (chronic obstructive pulmonary disease) (HCC)   . Depression   . Diabetes mellitus without complication (HCC) 08/20/2018  . Enlarged prostate   . Erectile dysfunction   . Essential hypertension   . History of kidney stones    multiple lithotripsies  . Hyperlipidemia LDL goal <70   . Hypogonadism in male    Previously on testosterone injections, but has not yet restarted until further testing  . Muscle pain   . PAF (paroxysmal atrial fibrillation) (HCC) 03/2015   Noted per-cath & post-op CABG.  Dr. Herbie BaltimoreHarding, cardiology  . Peripheral vascular disease (HCC)    Bilateral ABIs 0.8  . Renal stones 03/2016  . S/P CABG x 5 04/04/2015   LIMA to LAD, SVG to DIAG, SVG to OM, seqSVG -PDA- dCx  . SOB (shortness of breath)   . 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 Weakness    weakness  bilateral legs, s/p vein harvesting for CABPG   Past Surgical History:  Procedure Laterality Date  . 48-HOUR HOLTER MONITOR  02/2018   Sinus rhythm with some sinus bradycardia and sinus tachycardia.  Rare PVCs.  3 episodes of trigeminy for a total of 51 beats.  Frequent PACs no runs, but 3 pairs noted.  12 bigeminal episodes noted.  No arrhythmia.  Minimum heart rate 55 bpm, max 135 bpm. ->  Reportedly had 2 events while wearing monitor, but no obvious abnormality associated  . BLEPHAROPLASTY    . CARDIAC CATHETERIZATION  1992  . CARDIAC CATHETERIZATION N/A 03/31/2015   Procedure: Left Heart Cath and Coronary Angiography;  Surgeon: Marykay Lexavid W Harding, MD;  [facility]: LM 65%, mLAD 70%, 90% & 80%, o-pCx 75%, mCx 95% & d Cx 70%, OM1 60%, mRCA 90% with 100% dRCA. R-R collaterals --> CABG referral. Also noted to be in Afib.  . Carotid Dopplers  09/2017   Patent vertebrals. Mild bilateral carotid plaque, <50% stenosis bilaterally.  . CHOLECYSTECTOMY  2000   also had revision of hernia repair  . CORONARY ARTERY BYPASS GRAFT N/A 04/04/2015   Procedure: CORONARY ARTERY BYPASS GRAFTING (CABG);  Surgeon: Delight OvensEdward B Gerhardt, MD;  [facility];  Service: Open Heart Surgery;  Laterality: N/A;  . CYSTECTOMY  face and scrotum  . CYSTOSCOPY WITH INSERTION OF UROLIFT N/A 11/12/2016   Procedure: CYSTOSCOPY WITH INSERTION OF UROLIFT x4;  Surgeon: Jethro Bolusannenbaum, Sigmund, MD;  [facility];  Service: Urology;  Laterality: N/A;  . CYSTOSCOPY WITH RETROGRADE PYELOGRAM, URETEROSCOPY AND STENT PLACEMENT Right 06/26/2016   Procedure: CYSTOSCOPY WITH RIGHT RETROGRADE PYELOGRAM, RIGHT URETEROSCOPY , HOLMIUM LASER, BASKET EXTRACTION AND STENT PLACEMENT;  Surgeon: Jethro BolusSigmund Tannenbaum, MD;  [facility];  Service: Urology;  Laterality: Right;  . CYSTOSCOPY WITH RETROGRADE PYELOGRAM, URETEROSCOPY AND STENT PLACEMENT Left 09/23/2017   Procedure: CYSTOSCOPY WITH LEFT RETROGRADE PYELOGRAM, URETEROSCOPY STONE  EXTRACTION AND STENT PLACEMENT;  Surgeon: Crista ElliotBell, Eugene D III, MD;  [facility];  Service: Urology;  Laterality: Left;  . LEXISCAN MYOVIEW  June 2010   [facility] due to inability to reach target heart rate) --the study was normal with no evidence of ischemia and normal LV function. EF 7  . LITHOTRIPSY    . NM MYOVIEW LTD  05/2018   LOW RISK EF 45 to 54%.  Small fixed mild severity defect in the apex which could present either subendocardial infarct versus artifact.  No evidence of ischemia.  . TEE WITHOUT CARDIOVERSION N/A 04/04/2015   Procedure: TRANSESOPHAGEAL ECHOCARDIOGRAM (TEE);  Surgeon: Delight OvensEdward B Gerhardt, MD;  [facility];  Service: Open Heart Surgery;  Laterality: N/A;  . TRANSTHORACIC ECHOCARDIOGRAM  12/30/2016   EF 60-65%. GR 1 DD. Mildly dilated ascending aorta. Otherwise essentially normal with no valve lesions.  Marland Kitchen. VASECTOMY  1975  . VENTRAL HERNIA REPAIR  1994   complicated by infection   Social History   Socioeconomic History  . Marital status: Married    Spouse name: Not on file  . Number of children: 2  . Years of education: 6514  . Highest education level: Not on file  Occupational History  . Not on file  Social Needs  . Financial resource strain: Not hard at all  . Food insecurity    Worry: Never true    Inability: Never true  . Transportation needs    Medical: No    Non-medical: No  Tobacco Use  . Smoking status: Former Smoker    Packs/day: 2.00    Years: 40.00    Pack years: 80.00    Types: Cigarettes    Quit date: 10/17/1992    Years since quitting: 26.2  . Smokeless tobacco: Never Used  . Tobacco comment: quit 1994  Substance and Sexual Activity  . Alcohol use: Yes    Alcohol/week: 1.0 standard drinks    Types: 1 Standard drinks or equivalent per week    Comment: seldom - vodka or wine  . Drug use: No  . Sexual activity: Never  Lifestyle  . Physical activity    Days per week: 5 days    Minutes per session: Not on  file  . Stress: Not at all  Relationships  . Social Musicianconnections    Talks on phone: Not on file    Gets together: Not on file    Attends religious service: Not on file    Active member of club or organization: Not on file    Attends meetings of clubs or organizations: Not on file    Relationship status: Not on file  Other Topics Concern  . Not on file  Social History Narrative   [place]. No pets   Two children.      Former smoker. Quit in 1994. Prior  to quitting was unable to walk without exertional dyspnea.      Work - Retired. 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      Hobbies - reading, drives Corvette, chuch   No Known Allergies Family History  Problem Relation Age of Onset  . Coronary artery disease Father   . Heart disease Father   . Stroke Mother   . Hearing loss Mother   . Stroke Sister   . Hearing loss Maternal Grandmother   . Prostate cancer Neg Hx   . Kidney disease Neg Hx   . Other Neg Hx        hypogonadism     Current Outpatient Medications (Cardiovascular):  .  isosorbide mononitrate (IMDUR) 30 MG 24 hr tablet, Take 1 tablet (30 mg total) by mouth daily. .  midodrine (PROAMATINE) 5 MG tablet, Take 1 tablet (5 mg total) by mouth 2 (two) times daily with a meal.  Current Outpatient Medications (Respiratory):  .  albuterol (PROVENTIL HFA;VENTOLIN HFA) 108 (90 Base) MCG/ACT inhaler, Inhale 1-2 puffs into the lungs every 4 (four) hours as needed for wheezing or shortness of breath.  Current Outpatient Medications (Analgesics):  .  acetaminophen (TYLENOL) 650 MG CR tablet, Take 650 mg by mouth every 8 (eight) hours as needed for pain.  Current Outpatient Medications (Hematological):  .  clopidogrel (PLAVIX) 75 MG tablet, TAKE 1 TABLET EVERY DAY  Current Outpatient Medications (Other):  Marland Kitchen  ALPRAZolam (XANAX) 0.5 MG tablet, Take 1-2 pills as needed on call to MRI. .  eszopiclone  (LUNESTA) 1 MG TABS tablet, TAKE 1 TABLET (1 MG TOTAL) BY MOUTH AT BEDTIME AS NEEDED FOR SLEEP. TAKE IMMEDIATELY BEFORE BEDTIME .  tamsulosin (FLOMAX) 0.4 MG CAPS capsule, Take 0.4 mg by mouth daily.     Past medical history, social, surgical and family history all reviewed in electronic medical record.  No pertanent information unless stated regarding to the chief complaint.   Review of Systems:  No headache, visual changes, nausea, vomiting, diarrhea, constipation, dizziness, abdominal pain, skin rash, fevers, chills, night sweats, weight loss, swollen lymph nodes, body aches, joint swelling,  chest pain, shortness of breath, mood changes.  Positive muscle aches  Objective  Blood pressure 122/64, pulse 97, height 5' 8.5" (1.74 m), weight 200 lb (90.7 kg), SpO2 95 %.    General: No apparent distress alert and oriented x3 mood and affect normal, dressed appropriately.  HEENT: Pupils equal, extraocular movements intact  Respiratory: Patient's speak in full sentences and does not appear short of breath  Cardiovascular: No lower extremity edema, non tender, no erythema  Skin: Warm dry intact with no signs of infection or rash on extremities or on axial skeleton.  Abdomen: Soft nontender  Neuro: Cranial nerves II through XII are intact, neurovascularly intact in all extremities with 2+ DTRs and 2+ pulses.  Lymph: No lymphadenopathy of posterior or anterior cervical chain or axillae bilaterally.  Gait mild shuffling gait MSK:  tender with full range of motion and good stability and symmetric strength and tone of shoulders, elbows, wrist, knee and ankles bilaterally.  Mild hypertonicity noted Hip: Bilateral ROM IR: 45 Deg, ER: 25 Deg, Flexion: 120 Deg, Extension: 100 Deg, Abduction: 45 Deg, Adduction: 25 Deg Strength IR: 5/5, ER: 5/5, Flexion: 5/5, Extension: 5/5, Abduction: 5/5, Adduction: 5/5 Pelvic alignment unremarkable to inspection and palpation. Standing hip rotation and gait without  trendelenburg sign / unsteadiness. Greater trochanter w tenderness in  the bursitis greater trochanteric area bilaterally.  Mild limited range of motion on the left and with negative straight leg test but significant tightness of the hamstrings bilaterally   Procedure: Real-time Ultrasound Guided Injection of right greater trochanteric bursitis secondary to patient's body habitus Device: GE Logiq Q7 Ultrasound guided injection is preferred based studies that show increased duration, increased effect, greater accuracy, decreased procedural pain, increased response rate, and decreased cost with ultrasound guided versus blind injection.  Verbal informed consent obtained.  Time-out conducted.  Noted no overlying erythema, induration, or other signs of local infection.  Skin prepped in a sterile fashion.  Local anesthesia: Topical Ethyl chloride.  With sterile technique and under real time ultrasound guidance:  Greater trochanteric area was visualized and patient's bursa was noted. A 22-gauge 3 inch needle was inserted and 4 cc of 0.5% Marcaine and 1 cc of Kenalog 40 mg/dL was injected. Pictures taken Completed without difficulty  Pain immediately resolved suggesting accurate placement of the medication.  Advised to call if fevers/chills, erythema, induration, drainage, or persistent bleeding.  Images permanently stored and available for review in the ultrasound unit.  Impression: Technically successful ultrasound guided injection.   Procedure: Real-time Ultrasound Guided Injection of left  greater trochanteric bursitis secondary to patient's body habitus Device: GE Logiq Q7  Ultrasound guided injection is preferred based studies that show increased duration, increased effect, greater accuracy, decreased procedural pain, increased response rate, and decreased cost with ultrasound guided versus blind injection.  Verbal informed consent obtained.  Time-out conducted.  Noted no overlying erythema,  induration, or other signs of local infection.  Skin prepped in a sterile fashion.  Local anesthesia: Topical Ethyl chloride.  With sterile technique and under real time ultrasound guidance:  Greater trochanteric area was visualized and patient's bursa was noted. A 22-gauge 3 inch needle was inserted and 4 cc of 0.5% Marcaine and 1 cc of Kenalog 40 mg/dL was injected. Pictures taken Completed without difficulty  Pain immediately resolved suggesting accurate placement of the medication.  Advised to call if fevers/chills, erythema, induration, drainage, or persistent bleeding.  Images permanently stored and available for review in the ultrasound unit.  Impression: Technically successful ultrasound guided injection.    Impression and Recommendations:     This case required medical decision making of moderate complexity. The above documentation has been reviewed and is accurate and complete Marc SaaZachary M Leronda Lewers, DO       Note: This dictation was prepared with Dragon dictation along with smaller phrase technology. Any transcriptional errors that result from this process are unintentional.

## 2019-01-15 ENCOUNTER — Other Ambulatory Visit: Payer: Self-pay

## 2019-01-15 ENCOUNTER — Ambulatory Visit: Payer: Self-pay

## 2019-01-15 ENCOUNTER — Ambulatory Visit (INDEPENDENT_AMBULATORY_CARE_PROVIDER_SITE_OTHER): Payer: Medicare Other | Admitting: Family Medicine

## 2019-01-15 ENCOUNTER — Encounter: Payer: Self-pay | Admitting: Family Medicine

## 2019-01-15 VITALS — BP 122/64 | HR 97 | Ht 68.5 in | Wt 200.0 lb

## 2019-01-15 DIAGNOSIS — M25552 Pain in left hip: Secondary | ICD-10-CM

## 2019-01-15 DIAGNOSIS — M7062 Trochanteric bursitis, left hip: Secondary | ICD-10-CM

## 2019-01-15 DIAGNOSIS — M25551 Pain in right hip: Secondary | ICD-10-CM

## 2019-01-15 DIAGNOSIS — I208 Other forms of angina pectoris: Secondary | ICD-10-CM | POA: Diagnosis not present

## 2019-01-15 DIAGNOSIS — M7061 Trochanteric bursitis, right hip: Secondary | ICD-10-CM | POA: Diagnosis not present

## 2019-01-15 NOTE — Telephone Encounter (Signed)
We have discussed his symptoms many times and he has been evaluated by multiple specialists for them. It should be ok for him to be rescheduled with me for the next available. I will look at my schedule for the next several weeks to open up some slots and we could place him in one of those days. Thanks.

## 2019-01-15 NOTE — Patient Instructions (Signed)
Murelax and colace as needed 10-12 weeks

## 2019-01-15 NOTE — Assessment & Plan Note (Signed)
Bilateral greater trochanteric injections given today.  Tolerated the procedures well.  Has done fairly well with these.  Does not want to do anymore interventions such as looking at the back anymore.  No change in medications per patient.  Patient Injections every 3 to 4 months if needed.  Follow-up again in 12 weeks.

## 2019-01-19 ENCOUNTER — Other Ambulatory Visit: Payer: Self-pay

## 2019-01-19 ENCOUNTER — Ambulatory Visit (INDEPENDENT_AMBULATORY_CARE_PROVIDER_SITE_OTHER): Payer: Medicare Other | Admitting: Neurology

## 2019-01-19 DIAGNOSIS — R55 Syncope and collapse: Secondary | ICD-10-CM

## 2019-01-19 DIAGNOSIS — I480 Paroxysmal atrial fibrillation: Secondary | ICD-10-CM

## 2019-01-19 DIAGNOSIS — R519 Headache, unspecified: Secondary | ICD-10-CM

## 2019-01-19 DIAGNOSIS — Z8679 Personal history of other diseases of the circulatory system: Secondary | ICD-10-CM

## 2019-01-19 DIAGNOSIS — R0683 Snoring: Secondary | ICD-10-CM

## 2019-01-19 DIAGNOSIS — R351 Nocturia: Secondary | ICD-10-CM

## 2019-01-19 DIAGNOSIS — Z951 Presence of aortocoronary bypass graft: Secondary | ICD-10-CM

## 2019-01-19 DIAGNOSIS — R42 Dizziness and giddiness: Secondary | ICD-10-CM

## 2019-01-21 ENCOUNTER — Telehealth: Payer: Self-pay | Admitting: Family Medicine

## 2019-01-21 ENCOUNTER — Ambulatory Visit (INDEPENDENT_AMBULATORY_CARE_PROVIDER_SITE_OTHER): Payer: Medicare Other | Admitting: Family Medicine

## 2019-01-21 ENCOUNTER — Other Ambulatory Visit: Payer: Self-pay

## 2019-01-21 DIAGNOSIS — R05 Cough: Secondary | ICD-10-CM | POA: Diagnosis not present

## 2019-01-21 DIAGNOSIS — I208 Other forms of angina pectoris: Secondary | ICD-10-CM | POA: Diagnosis not present

## 2019-01-21 DIAGNOSIS — Z20828 Contact with and (suspected) exposure to other viral communicable diseases: Secondary | ICD-10-CM | POA: Diagnosis not present

## 2019-01-21 DIAGNOSIS — R059 Cough, unspecified: Secondary | ICD-10-CM

## 2019-01-21 DIAGNOSIS — F5104 Psychophysiologic insomnia: Secondary | ICD-10-CM

## 2019-01-21 DIAGNOSIS — R42 Dizziness and giddiness: Secondary | ICD-10-CM

## 2019-01-21 DIAGNOSIS — R209 Unspecified disturbances of skin sensation: Secondary | ICD-10-CM

## 2019-01-21 DIAGNOSIS — R6889 Other general symptoms and signs: Secondary | ICD-10-CM

## 2019-01-21 NOTE — Procedures (Signed)
     History: [name] is a 73 year old gentleman with a history of episodes of dizziness to include a lightheaded sensation and a sensation of spinning that may come and go, and a history of headache.  The patient is being evaluated for these events.  This is a routine EEG.  No skull defects are noted.  Medications include albuterol inhaler, Plavix, Lunesta, Imdur, midodrine, Flomax, and Tylenol.  EEG classification: Dysrhythmia grade 1 generalized  Description of recording: The background rhythms of this recording consists of a well modulated medium amplitude background activity of 7 Hz.  As the record progresses, the patient appears to remain in the waking state throughout the recording.  Photic stimulation is performed and results in a bilateral symmetric photic drive response.  Hyperventilation is then performed and results in a minimal buildup the back rhythm activities without significant increase in slowing seen.  At no time during the recording does there appear to be evidence of spike or spike wave discharges or evidence of focal slowing.  EKG monitor shows no evidence of cardiac rhythm abnormalities with a heart rate of 72.  Impression: This is an abnormal EEG recording secondary to diffuse symmetric theta frequency slowing.  This is a nonspecific recording and can be seen with any process the results in a mild toxic or metabolic encephalopathy or any dementing type illness.  No epileptiform discharges were seen.

## 2019-01-21 NOTE — Progress Notes (Signed)
Virtual Visit via video note  This visit type was conducted due to national recommendations for restrictions regarding the COVID-19 pandemic (e.g. social distancing).  This format is felt to be most appropriate for this patient at this time.  All issues noted in this document were discussed and addressed.  No physical exam was performed (except for noted visual exam findings with Video Visits).   I connected with [name] today at  3:15 PM EDT by a video enabled telemedicine application and verified that I am speaking with the correct person using two identifiers. Location patient: home Location provider: home office Persons participating in the virtual visit: patient, provider, wife  I discussed the limitations, risks, security and privacy concerns of performing an evaluation and management service by telephone and the availability of in person appointments. I also discussed with the patient that there may be a patient responsible charge related to this service. The patient expressed understanding and agreed to proceed.  Reason for visit: Follow-up.  HPI: Cough: Patient notes over the last week he has developed a cough and feels chest congestion and sinus congestion as well.  He has had some heaviness in his chest with this.  Feels like he has mucus stuck in his throat.  Some new mild dyspnea.  No fevers.  No COVID-19 exposure.  No travel.  He reports his wife has similar symptoms.  Dizziness: He does continue to have issues with this.  He saw rheumatology and they did some lab work though he notes they advised him that it was likely neurological.  He did recently have his EEG and completed a home sleep study which we have not received the results.  Cold arms: Patient notes for about a month or so his arms have felt cold to touch.  His wife notes she is able to get a good pulse in his left radial artery though has difficulty feeling his right radial artery to his capillary refill appears  normal in his right hand at less than 2 seconds.  Insomnia: Patient continues on Lunesta.  It takes him 2 hours to fall asleep though once he falls asleep he gets 6 to 7 hours of sleep.  He does wake up drowsy to a certain degree in the morning.  He does get up to use the restroom at night though he is able to fall asleep fairly quickly after doing that.   ROS: See pertinent positives and negatives per HPI.  Past Medical History:  Diagnosis Date  . Arthritis   . CAD, multiple vessel 03/2105   LM 65%, mLAD 70%, 90% & 80%, o-pCx 75%, mCx 95% & d Cx 70%, OM1 60%, mRCA 90% with 100% dRCA. R-R collaterals  . Chronic distal aortic occlusion (HCC)    By report  . Chronic kidney disease   . COPD (chronic obstructive pulmonary disease) (HCC)   . Depression   . Diabetes mellitus without complication (HCC) 08/20/2018  . Enlarged prostate   . Erectile dysfunction   . Essential hypertension   . History of kidney stones    multiple lithotripsies  . Hyperlipidemia LDL goal <70   . Hypogonadism in male    Previously on testosterone injections, but has not yet restarted until further testing  . Muscle pain   . PAF (paroxysmal atrial fibrillation) (HCC) 03/2015   Noted per-cath & post-op CABG.  Dr. Herbie BaltimoreHarding, cardiology  . Peripheral vascular disease (HCC)    Bilateral ABIs 0.8  . Renal stones 03/2016  .  S/P CABG x 5 04/04/2015   LIMA to LAD, SVG to DIAG, SVG to OM, seqSVG -PDA- dCx  . SOB (shortness of breath)   . 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 Weakness    weakness bilateral legs, s/p vein harvesting for CABPG    Past Surgical History:  Procedure Laterality Date  . 48-HOUR HOLTER MONITOR  02/2018   Sinus rhythm with some sinus bradycardia and sinus tachycardia.  Rare PVCs.  3 episodes of trigeminy for a total of 51 beats.  Frequent PACs no runs, but 3 pairs noted.  12 bigeminal episodes noted.  No arrhythmia.  Minimum heart rate 55 bpm, max 135  bpm. ->  Reportedly had 2 events while wearing monitor, but no obvious abnormality associated  . BLEPHAROPLASTY    . CARDIAC CATHETERIZATION  1992  . CARDIAC CATHETERIZATION N/A 03/31/2015   Procedure: Left Heart Cath and Coronary Angiography;  Surgeon: Leonie Man, MD;  Location: [place] [facility]: LM 65%, mLAD 70%, 90% & 80%, o-pCx 75%, mCx 95% & d Cx 70%, OM1 60%, mRCA 90% with 100% dRCA. R-R collaterals --> CABG referral. Also noted to be in Afib.  . Carotid Dopplers  09/2017   Patent vertebrals. Mild bilateral carotid plaque, <50% stenosis bilaterally.  . CHOLECYSTECTOMY  2000   also had revision of hernia repair  . CORONARY ARTERY BYPASS GRAFT N/A 04/04/2015   Procedure: CORONARY ARTERY BYPASS GRAFTING (CABG);  Surgeon: Grace Isaac, MD;  Location: [place];  Service: Open Heart Surgery;  Laterality: N/A;  . CYSTECTOMY     face and scrotum  . CYSTOSCOPY WITH INSERTION OF UROLIFT N/A 11/12/2016   Procedure: CYSTOSCOPY WITH INSERTION OF UROLIFT x4;  Surgeon: Carolan Clines, MD;  Location: [facility];  Service: Urology;  Laterality: N/A;  . CYSTOSCOPY WITH RETROGRADE PYELOGRAM, URETEROSCOPY AND STENT PLACEMENT Right 06/26/2016   Procedure: CYSTOSCOPY WITH RIGHT RETROGRADE PYELOGRAM, RIGHT URETEROSCOPY , HOLMIUM LASER, BASKET EXTRACTION AND STENT PLACEMENT;  Surgeon: Carolan Clines, MD;  Location: [facility];  Service: Urology;  Laterality: Right;  . CYSTOSCOPY WITH RETROGRADE PYELOGRAM, URETEROSCOPY AND STENT PLACEMENT Left 09/23/2017   Procedure: CYSTOSCOPY WITH LEFT RETROGRADE PYELOGRAM, URETEROSCOPY STONE EXTRACTION AND STENT PLACEMENT;  Surgeon: Lucas Mallow, MD;  Location: [facility];  Service: Urology;  Laterality: Left;  . LEXISCAN MYOVIEW  June 2010   converted to [facility] from treadmill due to inability to reach target heart rate) --the study was normal with no evidence of ischemia and normal LV function. EF 7  . LITHOTRIPSY    . NM MYOVIEW LTD  05/2018   LOW RISK EF 45 to  54%.  Small fixed mild severity defect in the apex which could present either subendocardial infarct versus artifact.  No evidence of ischemia.  . TEE WITHOUT CARDIOVERSION N/A 04/04/2015   Procedure: TRANSESOPHAGEAL ECHOCARDIOGRAM (TEE);  Surgeon: Grace Isaac, MD;  Location: [place];  Service: Open Heart Surgery;  Laterality: N/A;  . TRANSTHORACIC ECHOCARDIOGRAM  12/30/2016   EF 60-65%. GR 1 DD. Mildly dilated ascending aorta. Otherwise essentially normal with no valve lesions.  Mar[facility] Kitchen VASECTOMY  1975  . VENTRAL HERNIA REPAIR  5176   complicated by infection    Family History  Problem Relation Age of Onset  . Coronary artery disease Father   . Heart disease Father   . Stroke Mother   . Hearing loss Mother   . Stroke Sister   . Hearing loss  Maternal Grandmother   . Prostate cancer Neg Hx   . Kidney disease Neg Hx   . Other Neg Hx        hypogonadism    SOCIAL HX: Former smoker   Current Outpatient Medications:  .  acetaminophen (TYLENOL) 650 MG CR tablet, Take 650 mg by mouth every 8 (eight) hours as needed for pain., Disp: , Rfl:  .  albuterol (PROVENTIL HFA;VENTOLIN HFA) 108 (90 Base) MCG/ACT inhaler, Inhale 1-2 puffs into the lungs every 4 (four) hours as needed for wheezing or shortness of breath., Disp: 1 Inhaler, Rfl: 10 .  ALPRAZolam (XANAX) 0.5 MG tablet, Take 1-2 pills as needed on call to MRI., Disp: 2 tablet, Rfl: 0 .  clopidogrel (PLAVIX) 75 MG tablet, TAKE 1 TABLET EVERY DAY, Disp: 90 tablet, Rfl: 3 .  eszopiclone (LUNESTA) 1 MG TABS tablet, TAKE 1 TABLET (1 MG TOTAL) BY MOUTH AT BEDTIME AS NEEDED FOR SLEEP. TAKE IMMEDIATELY BEFORE BEDTIME, Disp: 30 tablet, Rfl: 0 .  isosorbide mononitrate (IMDUR) 30 MG 24 hr tablet, Take 1 tablet (30 mg total) by mouth daily., Disp: 90 tablet, Rfl: 3 .  midodrine (PROAMATINE) 5 MG tablet, Take 1 tablet (5 mg total) by mouth 2 (two) times daily with a meal., Disp: 180 tablet, Rfl: 1 .  tamsulosin (FLOMAX) 0.4 MG CAPS capsule, Take 0.4 mg  by mouth daily. , Disp: , Rfl:   EXAM:  VITALS per patient if applicable: None  GENERAL: alert, oriented, appears well and in no acute distress  HEENT: atraumatic, conjunttiva clear, no obvious abnormalities on inspection of external nose and ears  NECK: normal movements of the head and neck  LUNGS: on inspection no signs of respiratory distress, breathing rate appears normal, no obvious gross SOB, gasping or wheezing  CV: no obvious cyanosis, capillary refill less than 2 seconds when observed through video and right hand  MS: moves all visible extremities without noticeable abnormality  PSYCH/NEURO: pleasant and cooperative, no obvious depression or anxiety, speech and thought processing grossly intact  ASSESSMENT AND PLAN:  Discussed the following assessment and plan:  Cough New onset cough with congestion and mild dyspnea.  We will have him tested for COVID-19.  Once this returns if he is continuing to have symptoms we can consider further evaluation.  I discussed quarantine procedure for him and his wife to be quarantined at home at least until his testing comes back.  He is given reasons to seek medical attention.  Dizziness of unknown cause No obvious cause from ENT.  He has had his EEG and sleep study.  We will await the results of the sleep study.  We will also await the results of his EEG.  He will likely need to follow-up with neurology.  Insomnia Continue Lunesta at this time.  Could consider alternative medication in the future difficulty falling asleep.  Cold extremities Patient reports arm coldness.  Undetermined cause.  Appears to have good blood flow with good capillary refill on the right and intact radial pulse on the left though I did advise that he would need to be seen in the office for an exam once he is cleared of his possible COVID symptoms.    I discussed the assessment and treatment plan with the patient. The patient was provided an opportunity to ask  questions and all were answered. The patient agreed with the plan and demonstrated an understanding of the instructions.   The patient was advised to call back or seek an  in-person evaluation if the symptoms worsen or if the condition fails to improve as anticipated.   [name], MD

## 2019-01-21 NOTE — Progress Notes (Signed)
Please advise patient that the EEG, brain wave electrical test showed no signs of seizure-like changes but overall generalized slightly slower brain waves, which is a nonspecific finding, and can be seen in patients with memory loss, sometimes also from taking sedating medications.  I would recommend that he talk to his primary care physician about any potentially sedating medications including avoiding any prescription sleep aids. No obvious explanation for his dizzy spells.As discussed, we should proceed with sleep study evaluation.

## 2019-01-21 NOTE — Telephone Encounter (Signed)
This patient has had a cough and chest congestion as well as mild dyspnea for the past week. Please contact him to get him scheduled for a COVID19 test. Thanks.

## 2019-01-22 ENCOUNTER — Telehealth: Payer: Self-pay

## 2019-01-22 ENCOUNTER — Ambulatory Visit (INDEPENDENT_AMBULATORY_CARE_PROVIDER_SITE_OTHER): Payer: Medicare Other

## 2019-01-22 ENCOUNTER — Other Ambulatory Visit: Payer: Self-pay | Admitting: Family Medicine

## 2019-01-22 ENCOUNTER — Telehealth: Payer: Self-pay | Admitting: General Practice

## 2019-01-22 ENCOUNTER — Other Ambulatory Visit: Payer: Medicare Other

## 2019-01-22 DIAGNOSIS — G4700 Insomnia, unspecified: Secondary | ICD-10-CM

## 2019-01-22 DIAGNOSIS — R209 Unspecified disturbances of skin sensation: Secondary | ICD-10-CM | POA: Insufficient documentation

## 2019-01-22 DIAGNOSIS — Z0000 Encounter for general adult medical examination without abnormal findings: Secondary | ICD-10-CM | POA: Diagnosis not present

## 2019-01-22 DIAGNOSIS — R6889 Other general symptoms and signs: Secondary | ICD-10-CM | POA: Insufficient documentation

## 2019-01-22 DIAGNOSIS — Z20822 Contact with and (suspected) exposure to covid-19: Secondary | ICD-10-CM

## 2019-01-22 NOTE — Assessment & Plan Note (Signed)
Continue Lunesta at this time.  Could consider alternative medication in the future difficulty falling asleep.

## 2019-01-22 NOTE — Addendum Note (Signed)
Addended by: Denman George on: 01/22/2019 02:02 PM   Modules accepted: Orders

## 2019-01-22 NOTE — Telephone Encounter (Signed)
I called pt, discussed his EEG results and recommendations. Pt reports that his PCP ordered him an HST. Pt will follow up with his PCP. Pt verbalized understanding of results. Pt had no questions at this time but was encouraged to call back if questions arise.

## 2019-01-22 NOTE — Assessment & Plan Note (Signed)
No obvious cause from ENT.  He has had his EEG and sleep study.  We will await the results of the sleep study.  We will also await the results of his EEG.  He will likely need to follow-up with neurology.

## 2019-01-22 NOTE — Telephone Encounter (Signed)
-----   Message from Star Age, MD sent at 01/21/2019  5:19 PM EDT ----- Please advise patient that the EEG, brain wave electrical test showed no signs of seizure-like changes but overall generalized slightly slower brain waves, which is a nonspecific finding, and can be seen in patients with memory loss, sometimes also from taking sedating medications.  I would recommend that he talk to his primary care physician about any potentially sedating medications including avoiding any prescription sleep aids. No obvious explanation for his dizzy spells.As discussed, we should proceed with sleep study evaluation.

## 2019-01-22 NOTE — Assessment & Plan Note (Signed)
Patient reports arm coldness.  Undetermined cause.  Appears to have good blood flow with good capillary refill on the right and intact radial pulse on the left though I did advise that he would need to be seen in the office for an exam once he is cleared of his possible COVID symptoms.

## 2019-01-22 NOTE — Progress Notes (Signed)
I have reviewed the above note and agree.  Kieara Schwark, M.D.  

## 2019-01-22 NOTE — Telephone Encounter (Signed)
Pt has been scheduled for covid testing.  Pt was referred by: Leone Haven, MD

## 2019-01-22 NOTE — Progress Notes (Signed)
Subjective:   Essie HartFrank Floyd is a 73 y.o. male who presents for Medicare Annual/Subsequent preventive examination.  Review of Systems:  No ROS.  Medicare Wellness Virtual Visit.  Visual/audio telehealth visit, UTA vital signs.   See social history for additional risk factors.   Cardiac Risk Factors include: advanced age (>4355men, 93>65 women);male gender;diabetes mellitus;hypertension     Objective:    Vitals: There were no vitals taken for this visit.  There is no height or weight on file to calculate BMI.  Advanced Directives 01/22/2019 01/20/2018 10/03/2017 10/02/2017 09/20/2017 03/19/2017 12/30/2016  Does Patient Have a Medical Advance Directive? Yes Yes No No No No Yes  Type of Estate agentAdvance Directive Healthcare Power of SpoffordAttorney;Living will Healthcare Power of BakerAttorney;Living will - - - - MidwifeHealthcare Power of Attorney;Living will  Does patient want to make changes to medical advance directive? No - Patient declined No - Patient declined - - - - No - Patient declined  Copy of Healthcare Power of Attorney in Chart? Yes - validated most recent copy scanned in chart (See row information) Yes - - - - No - copy requested  Would patient like information on creating a medical advance directive? - - No - Patient declined - No - Patient declined - -    Tobacco Social History   Tobacco Use  Smoking Status Former Smoker   Packs/day: 2.00   Years: 40.00   Pack years: 80.00   Types: Cigarettes   Quit date: 10/17/1992   Years since quitting: 26.2  Smokeless Tobacco Never Used  Tobacco Comment   quit 1994     Counseling given: Not Answered Comment: quit 1994   Clinical Intake:  Pre-visit preparation completed: Yes        Diabetes: Yes(Followed by endocrinologist)  How often do you need to have someone help you when you read instructions, pamphlets, or other written materials from your doctor or pharmacy?: 1 - Never  Interpreter Needed?: No     Past Medical History:  Diagnosis Date     Arthritis    CAD, multiple vessel 03/2105   LM 65%, mLAD 70%, 90% & 80%, o-pCx 75%, mCx 95% & d Cx 70%, OM1 60%, mRCA 90% with 100% dRCA. R-R collaterals   Chronic distal aortic occlusion (HCC)    By report   Chronic kidney disease    COPD (chronic obstructive pulmonary disease) (HCC)    Depression    Diabetes mellitus without complication (HCC) 08/20/2018   Enlarged prostate    Erectile dysfunction    Essential hypertension    History of kidney stones    multiple lithotripsies   Hyperlipidemia LDL goal <70    Hypogonadism in male    Previously on testosterone injections, but has not yet restarted until further testing   Muscle pain    PAF (paroxysmal atrial fibrillation) (HCC) 03/2015   Noted per-cath & post-op CABG.  Dr. 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  Rare PVCs.  3 episodes of trigeminy for a total of 51 beats.  Frequent PACs no runs, but 3 pairs noted.  12 bigeminal episodes noted.  No arrhythmia.  Minimum heart rate 55 bpm, max 135 bpm. ->  Reportedly had 2 events while wearing monitor, but no obvious abnormality associated   [place] N/A 03/31/2015   Procedure: Left Heart Cath and Coronary Angiography;  Surgeon: Leonie Man, MD;  Location: [place] [facility] LAB: LM 65%, mLAD 70%, 90% & 80%, o-pCx 75%, mCx 95% & d Cx 70%, OM1 60%, mRCA 90% with 100% dRCA. R-R collaterals --> CABG referral. Also noted to be in  Afib.   Carotid Dopplers  09/2017   Patent vertebrals. Mild bilateral carotid plaque, <50% stenosis bilaterally.   CHOLECYSTECTOMY  2000   also had revision of hernia repair   CORONARY ARTERY BYPASS GRAFT N/A 04/04/2015   Procedure: CORONARY ARTERY BYPASS GRAFTING (CABG);  Surgeon: Grace Isaac, MD;  Location: [place];  Service: Open Heart Surgery;  Laterality: N/A;   CYSTECTOMY     face and scrotum   CYSTOSCOPY WITH INSERTION OF UROLIFT N/A 11/12/2016   Procedure: CYSTOSCOPY WITH INSERTION OF UROLIFT x4;  Surgeon: Carolan Clines, MD;  Location: [facility];  Service: Urology;  Laterality: N/A;   CYSTOSCOPY WITH RETROGRADE PYELOGRAM, URETEROSCOPY AND STENT PLACEMENT Right 06/26/2016   Procedure: CYSTOSCOPY WITH RIGHT RETROGRADE PYELOGRAM, RIGHT URETEROSCOPY , HOLMIUM LASER, BASKET EXTRACTION AND STENT PLACEMENT;  Surgeon: Carolan Clines, MD;  Location: [facility];  Service: Urology;  Laterality: Right;   CYSTOSCOPY WITH RETROGRADE PYELOGRAM, URETEROSCOPY AND STENT PLACEMENT Left 09/23/2017   Procedure: CYSTOSCOPY WITH LEFT RETROGRADE PYELOGRAM, URETEROSCOPY STONE EXTRACTION AND STENT PLACEMENT;  Surgeon: Lucas Mallow, MD;  Location: [facility];  Service: Urology;  Laterality: Left;   LEXISCAN MYOVIEW  June 2010   converted to [facility] from treadmill due to inability to reach target heart rate) --the study was normal with no evidence of ischemia and normal LV function. EF 7   LITHOTRIPSY     NM MYOVIEW LTD  05/2018   LOW RISK EF 45 to 54%.  Small fixed mild severity defect in the apex which could present either subendocardial infarct versus artifact.  No evidence of ischemia.   TEE WITHOUT CARDIOVERSION N/A 04/04/2015   Procedure: TRANSESOPHAGEAL ECHOCARDIOGRAM (TEE);  Surgeon: Grace Isaac, MD;  Location: [place];  Service: Open Heart Surgery;  Laterality: N/A;   TRANSTHORACIC ECHOCARDIOGRAM  12/30/2016   EF 60-65%. GR 1 DD. Mildly dilated ascending aorta. Otherwise essentially  normal with no valve lesions.   VASECTOMY  1975   VENTRAL HERNIA REPAIR  1478   complicated by infection   Family History  Problem Relation Age of Onset   Coronary artery disease Father    Heart disease Father    Stroke Mother    Hearing loss Mother    Stroke Sister    Hearing loss Maternal Grandmother    Prostate cancer Neg Hx    Kidney disease Neg Hx    Other Neg Hx        hypogonadism   Social History   Socioeconomic History   Marital status: Married    Spouse name: Not on file   Number of children: 2   Years of education: 14   Highest education level: Not on file  Occupational History   Not on file  Social Needs  Financial resource strain: Not hard at all   Food insecurity    Worry: Never true    Inability: Never true   Transportation needs    Medical: No    Non-medical: No  Tobacco Use   Smoking status: Former Smoker    Packs/day: 2.00    Years: 40.00    Pack years: 80.00    Types: Cigarettes    Quit date: 10/17/1992    Years since quitting: 26.2   Smokeless tobacco: Never Used   Tobacco comment: quit 1994  Substance and Sexual Activity   Alcohol use: Yes    Alcohol/week: 1.0 standard drinks    Types: 1 Standard drinks or equivalent per week    Comment: seldom - vodka or wine   Drug use: No   Sexual activity: Never  Lifestyle   Physical activity    Days per week: 5 days    Minutes per session: 10 min   Stress: Not at all  Relationships   Social connections    Talks on phone: Not on file    Gets together: Not on file    Attends religious service: Not on file    Active member of club or organization: Not on file    Attends meetings of clubs or organizations: Not on file    Relationship status: Not on file  Other Topics Concern   Not on file  Social History Narrative   [place]. No pets   Two children.      Former smoker. Quit in 1994. Prior to quitting was unable to walk without exertional  dyspnea.      Work - Retired. 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      Hobbies - reading, drives Corvette, chuch    Outpatient Encounter Medications as of 01/22/2019  Medication Sig   acetaminophen (TYLENOL) 650 MG CR tablet Take 650 mg by mouth every 8 (eight) hours as needed for pain.   albuterol (PROVENTIL HFA;VENTOLIN HFA) 108 (90 Base) MCG/ACT inhaler Inhale 1-2 puffs into the lungs every 4 (four) hours as needed for wheezing or shortness of breath.   ALPRAZolam (XANAX) 0.5 MG tablet Take 1-2 pills as needed on call to MRI.   clopidogrel (PLAVIX) 75 MG tablet TAKE 1 TABLET EVERY DAY   eszopiclone (LUNESTA) 1 MG TABS tablet TAKE 1 TABLET (1 MG TOTAL) BY MOUTH AT BEDTIME AS NEEDED FOR SLEEP. TAKE IMMEDIATELY BEFORE BEDTIME   isosorbide mononitrate (IMDUR) 30 MG 24 hr tablet Take 1 tablet (30 mg total) by mouth daily.   midodrine (PROAMATINE) 5 MG tablet Take 1 tablet (5 mg total) by mouth 2 (two) times daily with a meal.   tamsulosin (FLOMAX) 0.4 MG CAPS capsule Take 0.4 mg by mouth daily.    No facility-administered encounter medications on file as of 01/22/2019.     Activities of Daily Living In your present state of health, do you have any difficulty performing the following activities: 01/22/2019  Hearing? N  Vision? N  Difficulty concentrating or making decisions? N  Walking or climbing stairs? Y  Comment He paces himself  Dressing or bathing? N  Doing errands, shopping? N  Preparing Food and eating ? N  Using the Toilet? N  In the past six months, have you accidently leaked urine? N  Do you have problems with loss of bowel control? N  Managing your Medications? N  Managing your Finances? N  Housekeeping or managing your Housekeeping? N  Some recent data might be hidden    Patient Care Team: Glori Luis, MD as PCP - General (Family Medicine) Marykay Lex, MD as PCP - Cardiology (Cardiology)     Assessment:   This is a routine wellness examination for [place].  I connected with patient 01/22/19 at  8:30 AM EDT by a video/audio enabled telemedicine application and verified that I am speaking with the correct person using two identifiers. Patient stated full name and DOB. Patient gave permission to continue with virtual visit. Patient's location was at home and Nurse's location was at [place] office.   Health Screenings  Colonoscopy - 12/2012 Glaucoma -none Hearing -demonstrates normal hearing during visit. Hemoglobin A1C - 12/2018 Cholesterol - 03/2018 Dental- visits every 6 months Vision- visits within the last 12 months.  Social  Alcohol intake - yes      Smoking history- former    Smokers in home? none Illicit drug use? none Exercise - walking the dog daily Diet - regular Sexually Active -never BMI- discussed the importance of a healthy diet, water intake and the benefits of aerobic exercise.  Educational material provided.   Safety  Patient feels safe at home- yes Patient does have smoke detectors at home- yes Patient does wear sunscreen or protective clothing when in direct sunlight -yes Patient does wear seat belt when in a moving vehicle -yes Patient drives- yes, very little  Covid-19 precautions and sickness symptoms discussed.   Activities of Daily Living Patient denies needing assistance with: driving, household chores, feeding themselves, getting from bed to chair, getting to the toilet, bathing/showering, dressing, managing money, or preparing meals.  No new identified risk were noted.    Depression Screen Patient denies losing interest in daily life, feeling hopeless, or crying easily over simple problems.   Medication-taking as directed and without issues.   Fall Screen Patient denies being afraid of falling or falling in the last year.   Memory Screen Patient is alert.  Patient denies difficulty focusing, concentrating or misplacing items. Correctly  identified the president of the [place], season and recall. Patient likes to read and play computer games for brain stimulation.  Immunizations The following Immunizations were discussed: Influenza, shingles, pneumonia, and tetanus.   Other Providers Patient Care Team: Glori Luis, MD as PCP - General (Family Medicine) Marykay Lex, MD as PCP - Cardiology (Cardiology)  Exercise Activities and Dietary recommendations Current Exercise Habits: Home exercise routine, Type of exercise: walking, Time (Minutes): 10, Frequency (Times/Week): 5, Weekly Exercise (Minutes/Week): 50, Intensity: Mild  Goals      Patient Stated    DIET - INCREASE WATER INTAKE (pt-stated)     Maintain brain health with new activity (pt-stated)       Fall Risk Fall Risk  01/22/2019 01/20/2018 01/20/2018 11/19/2016 02/10/2016  Falls in the past year? 0 No No No No   Depression Screen PHQ 2/9 Scores 01/22/2019 01/20/2018 09/28/2017 11/19/2016  PHQ - 2 Score 1 0 1 0  PHQ- 9 Score - - - 0  Exception Documentation - - - -    Cognitive Function MMSE - Mini Mental State Exam 01/20/2018 11/17/2015  Orientation to time 5 5  Orientation to Place 5 5  Registration 3 3  Attention/ Calculation 5 5  Recall 2 3  Language- name 2 objects 2 2  Language- repeat 1 1  Language- follow 3 step command 3 3  Language- read & follow direction 1 1  Write a sentence 1 1  Copy design 1 1  Total score 29 30     6CIT Screen 01/22/2019  What Year? 0 points  What month? 0 points  What time? 0 points  Count back from 20 0 points  Months in reverse 0 points  Repeat phrase 2 points  Total Score 2    Immunization History  Administered Date(s) Administered   Influenza, High Dose Seasonal PF 03/22/2015, 03/05/2016, 03/25/2017, 04/10/2018   Influenza-Unspecified 02/15/2014, 03/28/2017   Pneumococcal Conjugate-13 12/17/2014   Pneumococcal Polysaccharide-23 11/19/2016   Screening Tests Health Maintenance  Topic Date Due   FOOT  EXAM  06/30/1956   URINE MICROALBUMIN  06[birth year]30/1954   TETANUS/TDAP  07/01/1963   INFLUENZA VACCINE  02/14/2019   HEMOGLOBIN A1C  07/09/2019   OPHTHALMOLOGY EXAM  09/02/2019   COLONOSCOPY  12/17/2022   Hepatitis C Screening  Completed   PNA vac Low Risk Adult  Completed       Plan:    End of life planning; Advance aging; Advanced directives discussed.  Copy of current HCPOA/Living Will on file.    Take up a new hobby (scrapbooking, play board or card games that require strategic thinking or memory such as chess/bridge/scrabble)  Do old activities in new ways (shake up what you already like to do..example.. If you like to cook buy a new cookbook or search for new recipes)  Nurture your inner artist Engineer, manufacturing systems(photography, painting, write a poem, start a journal)  Learn something new, just for fun [employer](logic games, sodoku, language)  Volunteer opportunities in a new organization (something you have never done before)  I have personally reviewed and noted the following in the patients chart:    Medical and social history  Use of alcohol, tobacco or illicit drugs   Current medications and supplements  Functional ability and status  Nutritional status  Physical activity  Advanced directives  List of other physicians  Hospitalizations, surgeries, and ER visits in previous 12 months  Vitals  Screenings to include cognitive, depression, and falls  Referrals and appointments  In addition, I have reviewed and discussed with patient certain preventive protocols, quality metrics, and best practice recommendations. A written personalized care plan for preventive services as well as general preventive health recommendations were provided to patient.     Ashok PallOBrien-Blaney, Tonatiuh Mallon L, LPN  1/6/10967/03/2019

## 2019-01-22 NOTE — Assessment & Plan Note (Signed)
New onset cough with congestion and mild dyspnea.  We will have him tested for COVID-19.  Once this returns if he is continuing to have symptoms we can consider further evaluation.  I discussed quarantine procedure for him and his wife to be quarantined at home at least until his testing comes back.  He is given reasons to seek medical attention.

## 2019-01-22 NOTE — Patient Instructions (Addendum)
  [name] , Thank you for taking time to come for your Medicare Wellness Visit. I appreciate your ongoing commitment to your health goals. Please review the following plan we discussed and let me know if I can assist you in the future.   Take up a new hobby (scrapbooking, play board or card games that require strategic thinking or memory such as chess/bridge/scrabble)  Do old activities in new ways (shake up what you already like to do..example.. If you like to cook buy a new cookbook or search for new recipes)  Nurture your inner artist IT consultant, painting, write a poem, start a journal)  Learn something new, just for fun [employer], sodoku, language)  Volunteer opportunities in a new organization (something you have never done before)   These are the goals we discussed: Goals      Patient Stated   . DIET - INCREASE WATER INTAKE (pt-stated)    . Maintain brain health with new activity (pt-stated)       This is a list of the screening recommended for you and due dates:  Health Maintenance  Topic Date Due  . Complete foot exam   06/30/1956  . Urine Protein Check  06/30/1956  . Tetanus Vaccine  07/01/1963  . Flu Shot  02/14/2019  . Hemoglobin A1C  07/09/2019  . Eye exam for diabetics  09/02/2019  . Colon Cancer Screening  12/17/2022  .  Hepatitis C: One time screening is recommended by Center for Disease Control  (CDC) for  adults born from [birth year] through 1967.   Completed  . Pneumonia vaccines  Completed

## 2019-01-23 ENCOUNTER — Ambulatory Visit: Payer: Medicare Other | Admitting: Family Medicine

## 2019-01-23 ENCOUNTER — Ambulatory Visit: Payer: Medicare Other

## 2019-01-29 ENCOUNTER — Telehealth: Payer: Self-pay | Admitting: Family Medicine

## 2019-01-29 DIAGNOSIS — L989 Disorder of the skin and subcutaneous tissue, unspecified: Secondary | ICD-10-CM

## 2019-01-29 LAB — NOVEL CORONAVIRUS, NAA: SARS-CoV-2, NAA: NOT DETECTED

## 2019-01-29 NOTE — Telephone Encounter (Signed)
Please let the patient know that I have not received his test result yet. We will contact him when it returns.

## 2019-01-29 NOTE — Telephone Encounter (Signed)
The patient stated he did not want to do the titration study for CPAP at this time, he stated he is very claustrophobic and does not think that he could sleep with a CPAP machine.  Patient also stated he received his Covid results and it was negative.  [name],cma

## 2019-01-29 NOTE — Telephone Encounter (Signed)
Please let the patient know that his sleep study revealed likely mild sleep apnea. They recommended that he have a CPAP titration study. Please find out if he is willing to do that. Thanks.

## 2019-02-09 ENCOUNTER — Telehealth: Payer: Self-pay | Admitting: Cardiology

## 2019-02-09 ENCOUNTER — Telehealth: Payer: Self-pay | Admitting: Family Medicine

## 2019-02-09 DIAGNOSIS — I951 Orthostatic hypotension: Secondary | ICD-10-CM

## 2019-02-09 MED ORDER — MIDODRINE HCL 5 MG PO TABS: 5.0000 mg | ORAL_TABLET | Freq: Two times a day (BID) | ORAL | 3 refills | Status: AC

## 2019-02-09 NOTE — Telephone Encounter (Signed)
PATIENT AWARE OF DR HARDING INSTRUCTIONS , PER PATIENT  HE DOES NOT SEE DIFFERENCE WITH DIZZINESS , HE STATES HE IS DIZZY.   PER  DR HARDING ,  KEEP TAKING  MIDODRINE 5 MG  TWICE A DAY.   PATIENT VERBALIZED UNDERSTANDING.

## 2019-02-09 NOTE — Telephone Encounter (Signed)
Patient calling regarding midodrine (PROAMATINE) 5 MG tablet , specifically the dosage/how to take. Advised patient he may need to contact cardiologist but patient is requesting call back from [place].

## 2019-02-09 NOTE — Telephone Encounter (Signed)
Returned call to patient he stated he was preparing pill box and he wanted to ask Dr.Harding if he should be taking Midodrine 5 mg twice a day or 2.5 mg twice a day.Stated at present he is taking 5 mg twice a day.Stated on mychart it says 5 mg total so he is confused on dose.Stated he continues to have dizziness and light headed,but his B/P is good.Advised I will send message to [place] for advice.

## 2019-02-09 NOTE — Telephone Encounter (Signed)
If he was taking 5 mg BID & feeling less lightheaded - then stay taking 5 mg BID.  We can update the med list.  Glenetta Hew, MD

## 2019-02-09 NOTE — Telephone Encounter (Signed)
° °  Pt c/o medication issue:  1. Name of Medication: midodrine (PROAMATINE) 5 MG tablet  2. How are you currently taking this medication (dosage and times per day)? 1 tablet 2x daily  3. Are you having a reaction (difficulty breathing--STAT)? no  4. What is your medication issue? Patient states that the medication instructions on his mychart are different than his instructions on his bottle.   On MyChart it states:  Take 1 tablet (5 mg total) by mouth 2 (two) times daily with a meal.  On his bottle the instructions say to take: Take 1 tablet twice daily with a meal.  He wants to know if he should be taking 5 mg at each dose, or 5 mg daily   If he is supposed to take 2.5 mg at each dose, he will need a new for the pills to be a smaller dose, because the pills are too small to cut in half

## 2019-02-09 NOTE — Telephone Encounter (Signed)
Called and spoke to the patient about his dosage of midodrine, I informed him to get clarification he needs to call the provider that prescribed it (Dr. Darcus Pester) office to make sure he is taking the medication correctly.  Patient understood.  Nina,cma

## 2019-02-19 NOTE — Telephone Encounter (Signed)
See result note COVID lab.

## 2019-03-06 ENCOUNTER — Ambulatory Visit: Payer: Self-pay

## 2019-03-06 NOTE — Telephone Encounter (Signed)
Would he be willing to go to an urgent care so someone can evaluate him? He really needs to be seen in the ED though if he is refusing that urgent care would be the next best option.

## 2019-03-06 NOTE — Telephone Encounter (Signed)
Pt wife called to report worsening nero symptoms for her husband.  She states that Dr Caryl Bis has seen him for some of these issues but he is now drooling from the left side of his mouth Per [name] she is unable to find a pulse rt side. He is weak especially right side. She states that he is frustrated and easily angered. He has trouble finding words or remembering words. Per protocol pt will need to go to the ER for evaluation. Call placed to office per request of patient and wife for possible appointment. Per office patient needs to go to ER. This was reported to wife. She states she will take him. Care advice read to wife.  She verbalized understanding.  Reason for Disposition . [1] Weakness (i.e., paralysis, loss of muscle strength) of the face, arm / hand, or leg / foot on one side of the body AND [2] sudden onset AND [3] brief (now gone)  Answer Assessment - Initial Assessment Questions 1. SYMPTOM: "What is the main symptom you are concerned about?" (e.g., weakness, numbness)     Rt side shakes and drooling 2. ONSET: "When did this start?" (minutes, hours, days; while sleeping)     Weeks and weeks 3. LAST NORMAL: "When was the last time you were normal (no symptoms)?"     Weeks ago 4. PATTERN "Does this come and go, or has it been constant since it started?"  "Is it present now?"   Progressing with shakes 5. CARDIAC SYMPTOMS: "Have you had any of the following symptoms: chest pain, difficulty breathing, palpitations?"     no 6. NEUROLOGIC SYMPTOMS: "Have you had any of the following symptoms: headache, dizziness, vision loss, double vision, changes in speech, unsteady on your feet?"    Hard to find words  7. OTHER SYMPTOMS: "Do you have any other symptoms?"     Easy to anger frustration 8. PREGNANCY: "Is there any chance you are pregnant?" "When was your last menstrual period?"     N/A  Protocols used: NEUROLOGIC DEFICIT-A-AH

## 2019-03-06 NOTE — Telephone Encounter (Signed)
Noted. It is possible that he has had some underlying neurological event that caused his symptoms. He needs to be seen for this though has declined ED and urgent care evaluation at this time. Please follow-up with him later this afternoon to see how he is doing and reinforce that he really does need to be seen for this issue even with his symptoms resolving. Thanks.

## 2019-03-06 NOTE — Telephone Encounter (Signed)
Called and spoke with patient wife, patient in back ground refusing to go to ER and refuses 911. "Patient wife stated he fights me he is to strong " I cannot make him go" Patient wife stated she knows that is where he needs to go to the ER but he refuses . Advised again to patient with wife holding hone , that he needs to go to the ER per wife he started fighting and getting angry and refusing ER. Patient wife stated she will call 911 if he worsens to where she can call them.

## 2019-03-06 NOTE — Telephone Encounter (Signed)
Spoke with patient , whom was speaking clearly , and he stated all symptoms have subsided at this time and he feels fine, that he will consider an UC if symptoms return.

## 2019-03-12 DIAGNOSIS — Z23 Encounter for immunization: Secondary | ICD-10-CM | POA: Diagnosis not present

## 2019-03-17 DIAGNOSIS — H2512 Age-related nuclear cataract, left eye: Secondary | ICD-10-CM | POA: Diagnosis not present

## 2019-03-24 ENCOUNTER — Ambulatory Visit (INDEPENDENT_AMBULATORY_CARE_PROVIDER_SITE_OTHER): Payer: Medicare Other

## 2019-03-24 ENCOUNTER — Encounter (INDEPENDENT_AMBULATORY_CARE_PROVIDER_SITE_OTHER): Payer: Self-pay | Admitting: Vascular Surgery

## 2019-03-24 ENCOUNTER — Ambulatory Visit (INDEPENDENT_AMBULATORY_CARE_PROVIDER_SITE_OTHER): Payer: Medicare Other | Admitting: Vascular Surgery

## 2019-03-24 ENCOUNTER — Other Ambulatory Visit: Payer: Self-pay

## 2019-03-24 ENCOUNTER — Encounter: Payer: Self-pay | Admitting: *Deleted

## 2019-03-24 VITALS — BP 122/78 | HR 90 | Resp 16 | Wt 201.0 lb

## 2019-03-24 DIAGNOSIS — E119 Type 2 diabetes mellitus without complications: Secondary | ICD-10-CM | POA: Diagnosis not present

## 2019-03-24 DIAGNOSIS — I10 Essential (primary) hypertension: Secondary | ICD-10-CM | POA: Diagnosis not present

## 2019-03-24 DIAGNOSIS — I208 Other forms of angina pectoris: Secondary | ICD-10-CM | POA: Diagnosis not present

## 2019-03-24 DIAGNOSIS — I70213 Atherosclerosis of native arteries of extremities with intermittent claudication, bilateral legs: Secondary | ICD-10-CM

## 2019-03-24 DIAGNOSIS — E785 Hyperlipidemia, unspecified: Secondary | ICD-10-CM

## 2019-03-24 DIAGNOSIS — I739 Peripheral vascular disease, unspecified: Secondary | ICD-10-CM | POA: Diagnosis not present

## 2019-03-24 DIAGNOSIS — Z951 Presence of aortocoronary bypass graft: Secondary | ICD-10-CM | POA: Diagnosis not present

## 2019-03-24 NOTE — Patient Instructions (Signed)
Peripheral Vascular Disease  Peripheral vascular disease (PVD) is a disease of the blood vessels that are not part of your heart and brain. A simple term for PVD is poor circulation. In most cases, PVD narrows the blood vessels that carry blood from your heart to the rest of your body. This can reduce the supply of blood to your arms, legs, and internal organs, like your stomach or kidneys. However, PVD most often affects a person's lower legs and feet. Without treatment, PVD tends to get worse. PVD can also lead to acute ischemic limb. This is when an arm or leg suddenly cannot get enough blood. This is a medical emergency. Follow these instructions at home: Lifestyle  Do not use any products that contain nicotine or tobacco, such as cigarettes and e-cigarettes. If you need help quitting, ask your doctor.  Lose weight if you are overweight. Or, stay at a healthy weight as told by your doctor.  Eat a diet that is low in fat and cholesterol. If you need help, ask your doctor.  Exercise regularly. Ask your doctor for activities that are right for you. General instructions  Take over-the-counter and prescription medicines only as told by your doctor.  Take good care of your feet: ? Wear comfortable shoes that fit well. ? Check your feet often for any cuts or sores.  Keep all follow-up visits as told by your doctor This is important. Contact a doctor if:  You have cramps in your legs when you walk.  You have leg pain when you are at rest.  You have coldness in a leg or foot.  Your skin changes.  You are unable to get or have an erection (erectile dysfunction).  You have cuts or sores on your feet that do not heal. Get help right away if:  Your arm or leg turns cold, numb, and blue.  Your arms or legs become red, warm, swollen, painful, or numb.  You have chest pain.  You have trouble breathing.  You suddenly have weakness in your face, arm, or leg.  You become very  confused or you cannot speak.  You suddenly have a very bad headache.  You suddenly cannot see. Summary  Peripheral vascular disease (PVD) is a disease of the blood vessels.  A simple term for PVD is poor circulation. Without treatment, PVD tends to get worse.  Treatment may include exercise, low fat and low cholesterol diet, and quitting smoking. This information is not intended to replace advice given to you by your health care provider. Make sure you discuss any questions you have with your health care provider. Document Released: 09/26/2009 Document Revised: 06/14/2017 Document Reviewed: 08/09/2016 Elsevier Patient Education  2020 Elsevier Inc.  

## 2019-03-24 NOTE — Progress Notes (Signed)
MRN : [id number]  [name] is a 73 y.o. (12/15/1945) male who presents with chief complaint of  Chief Complaint  Patient presents with  . Follow-up    ultrasound follow up  .  History of Present Illness: Patient returns today in follow up of his known PAD.  He has a remote history of aortoiliac occlusion treated conservatively with no surgery.  He has mild claudication symptoms at current but certainly no limb threatening symptoms.  No rest pain.  No ulceration.  His ABIs today are stable at 0.83 on the right and 0.96 on the left with biphasic waveforms distally essentially unchanged from his previous studies.  Current Outpatient Medications  Medication Sig Dispense Refill  . acetaminophen (TYLENOL) 650 MG CR tablet Take 650 mg by mouth every 8 (eight) hours as needed for pain.    Marland Kitchen ALPRAZolam (XANAX) 0.5 MG tablet Take 1-2 pills as needed on call to MRI. 2 tablet 0  . clopidogrel (PLAVIX) 75 MG tablet TAKE 1 TABLET EVERY DAY 90 tablet 3  . eszopiclone (LUNESTA) 1 MG TABS tablet TAKE 1 TABLET BY MOUTH AT BEDTIME AS NEEDED FOR SLEEP IMMEDIATELY BEFORE BEDTIME 30 tablet 0  . isosorbide mononitrate (IMDUR) 30 MG 24 hr tablet Take 1 tablet (30 mg total) by mouth daily. 90 tablet 3  . tamsulosin (FLOMAX) 0.4 MG CAPS capsule Take 0.4 mg by mouth daily.     Marland Kitchen albuterol (PROVENTIL HFA;VENTOLIN HFA) 108 (90 Base) MCG/ACT inhaler Inhale 1-2 puffs into the lungs every 4 (four) hours as needed for wheezing or shortness of breath. (Patient not taking: Reported on 03/24/2019) 1 Inhaler 10  . midodrine (PROAMATINE) 5 MG tablet Take 1 tablet (5 mg total) by mouth 2 (two) times daily with a meal. (Patient not taking: Reported on 03/24/2019) 180 tablet 3   No current facility-administered medications for this visit.     Past Medical History:  Diagnosis Date  . Arthritis   . CAD, multiple vessel 03/2105   LM 65%, mLAD 70%, 90% & 80%, o-pCx 75%, mCx 95% & d Cx 70%, OM1 60%, mRCA 90% with 100% dRCA. R-R  collaterals  . Chronic distal aortic occlusion (HCC)    By report  . Chronic kidney disease   . COPD (chronic obstructive pulmonary disease) ([place])   . Depression   . Diabetes mellitus without complication ([place]) 8/0/1655  . Enlarged prostate   . Erectile dysfunction   . Essential hypertension   . History of kidney stones    multiple lithotripsies  . Hyperlipidemia LDL goal <70   . Hypogonadism in male    Previously on testosterone injections, but has not yet restarted until further testing  . Muscle pain   . PAF (paroxysmal atrial fibrillation) ([place]) 03/2015   Noted per-cath & post-op CABG.  Dr. Ellyn Hack, cardiology  . Peripheral vascular disease (HCC)    Bilateral ABIs 0.8  . Renal stones 03/2016  . S/P CABG x 5 04/04/2015   LIMA to LAD, SVG to DIAG, SVG to OM, seqSVG -PDA- dCx  . SOB (shortness of breath)   . 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 Weakness    weakness bilateral legs, s/p vein harvesting for CABPG    Past Surgical History:  Procedure Laterality Date  . 48-HOUR HOLTER MONITOR  02/2018   Sinus rhythm with some sinus bradycardia and sinus tachycardia.  Rare PVCs.  3 episodes of trigeminy for a total of 51  beats.  Frequent PACs no runs, but 3 pairs noted.  12 bigeminal episodes noted.  No arrhythmia.  Minimum heart rate 55 bpm, max 135 bpm. ->  Reportedly had 2 events while wearing monitor, but no obvious abnormality associated  . BLEPHAROPLASTY    . CARDIAC CATHETERIZATION  1992  . CARDIAC CATHETERIZATION N/A 03/31/2015   Procedure: Left Heart Cath and Coronary Angiography;  Surgeon: Leonie Man, MD;  Location: [facility]: LM 65%, mLAD 70%, 90% & 80%, o-pCx 75%, mCx 95% & d Cx 70%, OM1 60%, mRCA 90% with 100% dRCA. R-R collaterals --> CABG referral. Also noted to be in Afib.  . Carotid Dopplers  09/2017   Patent vertebrals. Mild bilateral carotid plaque, <50% stenosis bilaterally.  . CHOLECYSTECTOMY  2000   also  had revision of hernia repair  . CORONARY ARTERY BYPASS GRAFT N/A 04/04/2015   Procedure: CORONARY ARTERY BYPASS GRAFTING (CABG);  Surgeon: Grace Isaac, MD;  Location: [facility];  Service: Open Heart Surgery;  Laterality: N/A;  . CYSTECTOMY     face and scrotum  . CYSTOSCOPY WITH INSERTION OF UROLIFT N/A 11/12/2016   Procedure: CYSTOSCOPY WITH INSERTION OF UROLIFT x4;  Surgeon: Carolan Clines, MD;  Location: [facility];  Service: Urology;  Laterality: N/A;  . CYSTOSCOPY WITH RETROGRADE PYELOGRAM, URETEROSCOPY AND STENT PLACEMENT Right 06/26/2016   Procedure: CYSTOSCOPY WITH RIGHT RETROGRADE PYELOGRAM, RIGHT URETEROSCOPY , HOLMIUM LASER, BASKET EXTRACTION AND STENT PLACEMENT;  Surgeon: Carolan Clines, MD;  Location: [facility];  Service: Urology;  Laterality: Right;  . CYSTOSCOPY WITH RETROGRADE PYELOGRAM, URETEROSCOPY AND STENT PLACEMENT Left 09/23/2017   Procedure: CYSTOSCOPY WITH LEFT RETROGRADE PYELOGRAM, URETEROSCOPY STONE EXTRACTION AND STENT PLACEMENT;  Surgeon: Lucas Mallow, MD;  Location: [facility];  Service: Urology;  Laterality: Left;  . LEXISCAN MYOVIEW  June 2010   converted to [facility] from treadmill due to inability to reach target heart rate) --the study was normal with no evidence of ischemia and normal LV function. EF 7  . LITHOTRIPSY    . NM MYOVIEW LTD  05/2018   LOW RISK EF 45 to 54%.  Small fixed mild severity defect in the apex which could present either subendocardial infarct versus artifact.  No evidence of ischemia.  . TEE WITHOUT CARDIOVERSION N/A 04/04/2015   Procedure: TRANSESOPHAGEAL ECHOCARDIOGRAM (TEE);  Surgeon: Grace Isaac, MD;  Location: [facility];  Service: Open Heart Surgery;  Laterality: N/A;  . TRANSTHORACIC ECHOCARDIOGRAM  12/30/2016   EF 60-65%. GR 1 DD. Mildly dilated ascending aorta. Otherwise essentially normal with no valve lesions.  Marland Kitchen VASECTOMY  1975  . VENTRAL HERNIA REPAIR  6144   complicated by infection    Social History Social History    Tobacco Use  . Smoking status: Former Smoker    Packs/day: 2.00    Years: 40.00    Pack years: 80.00    Types: Cigarettes    Quit date: 10/17/1992    Years since quitting: 26.4  . Smokeless tobacco: Never Used  . Tobacco comment: quit 1994  Substance Use Topics  . Alcohol use: Yes    Alcohol/week: 1.0 standard drinks    Types: 1 Standard drinks or equivalent per week    Comment: seldom - vodka or wine  . Drug use: No     Family History Family History  Problem Relation Age of Onset  . Coronary artery disease Father   . Heart disease Father   . Stroke Mother   . Hearing loss  Mother   . Stroke Sister   . Hearing loss Maternal Grandmother   . Prostate cancer Neg Hx   . Kidney disease Neg Hx   . Other Neg Hx        hypogonadism    No Known Allergies  REVIEW OF SYSTEMS(Negative unless checked)  Constitutional: '[]' ?Weight loss'[]' ?Fever'[]' ?Chills Cardiac:'[]' ?Chest pain'[]' ?Chest pressure'[x]' ?Palpitations '[]' ?Shortness of breath when laying flat '[]' ?Shortness of breath at rest '[x]' ?Shortness of breath with exertion. Vascular: '[x]' ?Pain in legs with walking'[]' ?Pain in legsat rest'[]' ?Pain in legs when laying flat '[x]' ?Claudication '[]' ?Pain in feet when walking '[]' ?Pain in feet at rest '[]' ?Pain in feet when laying flat '[]' ?History of DVT '[]' ?Phlebitis '[]' ?Swelling in legs '[]' ?Varicose veins '[]' ?Non-healing ulcers Pulmonary: '[]' ?Uses home oxygen '[]' ?Productive cough'[]' ?Hemoptysis '[]' ?Wheeze '[x]' ?COPD '[]' ?Asthma Neurologic: '[]' ?Dizziness '[]' ?Blackouts '[]' ?Seizures '[]' ?History of stroke '[]' ?History of TIA'[]' ?Aphasia '[]' ?Temporary blindness'[]' ?Dysphagia '[]' ?Weaknessor numbness in arms '[]' ?Weakness or numbnessin legs Musculoskeletal: '[x]' ?Arthritis '[]' ?Joint swelling '[]' ?Joint pain '[]' ?Low back pain Hematologic:'[]' ?Easy bruising'[]' ?Easy bleeding '[]' ?Hypercoagulable state '[]' ?Anemic '[]' ?Hepatitis Gastrointestinal:'[]' ?Blood in stool'[]' ?Vomiting  blood'[]' ?Gastroesophageal reflux/heartburn'[]' ?Abdominal pain Genitourinary: '[]' ?Chronic kidney disease '[]' ?Difficulturination '[]' ?Frequenturination '[]' ?Burning with urination'[]' ?Hematuria Skin: '[]' ?Rashes '[]' ?Ulcers '[]' ?Wounds Psychological: '[]' ?History of anxiety'[]' ?History of major depression.  Physical Examination  BP 122/78 (BP Location: Right Arm)   Pulse 90   Resp 16   Wt 201 lb (91.2 kg)   BMI 30.12 kg/m  Gen:  WD/WN, NAD Head: [place]/AT, No temporalis wasting. Ear/Nose/Throat: Hearing grossly intact, nares w/o erythema or drainage Eyes: Conjunctiva clear. Sclera non-icteric Neck: Supple.  Trachea midline Pulmonary:  Good air movement, no use of accessory muscles.  Cardiac: RRR, no JVD Vascular:  Vessel Right Left  Radial Palpable Palpable                          PT  1+ palpable  2+ palpable  DP  1+ palpable  1+ palpable   Gastrointestinal: soft, non-tender/non-distended. No guarding/reflex.  Musculoskeletal: M/S 5/5 throughout.  No deformity or atrophy.  Trace lower extremity edema. Neurologic: Sensation grossly intact in extremities.  Symmetrical.  Speech is fluent.  Psychiatric: Judgment intact, Mood & affect appropriate for pt's clinical situation. Dermatologic: No rashes or ulcers noted.  No cellulitis or open wounds.       Labs Recent Results (from the past 2160 hour(s))  CBC with Differential/Platelet     Status: Abnormal   Collection Time: 12/26/18  2:33 PM  Result Value Ref Range   WBC 7.9 4.0 - 10.5 K/uL   RBC 3.78 (L) 4.22 - 5.81 Mil/uL   Hemoglobin 12.5 (L) 13.0 - 17.0 g/dL   HCT 36.7 (L) 39.0 - 52.0 %   MCV 97.2 78.0 - 100.0 fl   MCHC 34.1 30.0 - 36.0 g/dL   RDW 13.0 11.5 - 15.5 %   Platelets 194.0 150.0 - 400.0 K/uL   Neutrophils Relative % 62.8 43.0 - 77.0 %   Lymphocytes Relative 25.9 12.0 - 46.0 %   Monocytes Relative 9.8 3.0 - 12.0 %   Eosinophils Relative 0.8 0.0 - 5.0 %   Basophils Relative 0.7 0.0 - 3.0 %   Neutro Abs  5.0 1.4 - 7.7 K/uL   Lymphs Abs 2.1 0.7 - 4.0 K/uL   Monocytes Absolute 0.8 0.1 - 1.0 K/uL   Eosinophils Absolute 0.1 0.0 - 0.7 K/uL   Basophils Absolute 0.1 0.0 - 0.1 K/uL  Antinuclear Antib (ANA)     Status: None   Collection Time: 12/26/18  2:33 PM  Result Value Ref Range   Anti Nuclear Antibody (ANA) NEGATIVE NEGATIVE  Comment: ANA IFA is a first line screen for detecting the presence of up to approximately 150 autoantibodies in various autoimmune diseases. A negative ANA IFA result suggests an ANA-associated autoimmune disease is not present at this time, but is not definitive. If there is high clinical suspicion for Sjogren's syndrome, testing for anti-SS-A/Ro antibody should be considered. Anti-Jo-1 antibody should be considered for clinically suspected inflammatory myopathies. . AC-0: Negative . International Consensus on ANA Patterns (https://www.hernandez-brewer.com/) . For additional information, please refer to http://education.[facility].com/faq/FAQ177 (This link is being provided for informational/ educational purposes only.) .   Cortisol     Status: None   Collection Time: 12/26/18  2:33 PM  Result Value Ref Range   Cortisol, Plasma 3.1 ug/dL    Comment: AM:  4.3 - 22.4 ug/dLPM:  3.1 - 16.7 ug/dL  CK (Creatine Kinase)     Status: None   Collection Time: 12/26/18  2:33 PM  Result Value Ref Range   Total CK 31 7 - 232 U/L  Comp Met (CMET)     Status: Abnormal   Collection Time: 12/26/18  2:33 PM  Result Value Ref Range   Sodium 136 135 - 145 mEq/L   Potassium 4.3 3.5 - 5.1 mEq/L   Chloride 104 96 - 112 mEq/L   CO2 23 19 - 32 mEq/L   Glucose, Bld 134 (H) 70 - 99 mg/dL   BUN 30 (H) 6 - 23 mg/dL   Creatinine, Ser 1.25 0.40 - 1.50 mg/dL   Total Bilirubin 0.4 0.2 - 1.2 mg/dL   Alkaline Phosphatase 52 39 - 117 U/L   AST 11 0 - 37 U/L   ALT 14 0 - 53 U/L   Total Protein 6.0 6.0 - 8.3 g/dL   Albumin 3.7 3.5 - 5.2 g/dL   Calcium 8.6 8.4 - 10.5  mg/dL   GFR 56.39 (L) >60.00 mL/min  POCT Urinalysis Dipstick     Status: Normal   Collection Time: 12/26/18  2:49 PM  Result Value Ref Range   Color, UA yellow    Clarity, UA clear    Glucose, UA Negative Negative   Bilirubin, UA neg    Ketones, UA neg    Spec Grav, UA 1.015 1.010 - 1.025   Blood, UA neg    pH, UA 5.0 5.0 - 8.0   Protein, UA Negative Negative   Urobilinogen, UA 0.2 0.2 or 1.0 E.U./dL   Nitrite, UA neg    Leukocytes, UA Negative Negative   Appearance     Odor    IBC + Ferritin     Status: None   Collection Time: 01/07/19 10:35 AM  Result Value Ref Range   Iron 93 42 - 165 ug/dL   Transferrin 216.0 212.0 - 360.0 mg/dL   Saturation Ratios 30.8 20.0 - 50.0 %   Ferritin 198.0 22.0 - 322.0 ng/mL  Hemoglobin A1C     Status: Abnormal   Collection Time: 01/07/19 10:35 AM  Result Value Ref Range   Hgb A1c MFr Bld 6.7 (H) 4.6 - 6.5 %    Comment: Glycemic Control Guidelines for People with Diabetes:Non Diabetic:  <6%Goal of Therapy: <7%Additional Action Suggested:  >8%   CBC     Status: Abnormal   Collection Time: 01/07/19 10:35 AM  Result Value Ref Range   WBC 7.2 4.0 - 10.5 K/uL   RBC 3.90 (L) 4.22 - 5.81 Mil/uL   Platelets 195.0 150.0 - 400.0 K/uL   Hemoglobin 12.8 (L) 13.0 - 17.0 g/dL  HCT 38.0 (L) 39.0 - 52.0 %   MCV 97.4 78.0 - 100.0 fl   MCHC 33.8 30.0 - 36.0 g/dL   RDW 13.2 11.5 - 15.5 %  CK (Creatine Kinase)     Status: Abnormal   Collection Time: 01/07/19 10:59 AM  Result Value Ref Range   Total CK 36 (L) 41 - 331 U/L    Comment:               **Please note reference interval change**  Aldolase     Status: None   Collection Time: 01/07/19 10:59 AM  Result Value Ref Range   Aldolase CANCELED U/L    Comment: Quantity was not sufficient for analysis.  Result canceled by the ancillary.   IFE, PE and FLC, Serum     Status: Abnormal   Collection Time: 01/07/19 10:59 AM  Result Value Ref Range   IgG (Immunoglobin G), Serum 902 603 - 1,613 mg/dL    IgA/Immunoglobulin A, Serum 161 61 - 437 mg/dL   IgM (Immunoglobulin M), Srm 37 15 - 143 mg/dL   Total Protein 6.3 6.0 - 8.5 g/dL   Albumin SerPl Elph-Mcnc CANCELED g/dL    Comment: Quantity was not sufficient for analysis.  Result canceled by the ancillary.    Alpha 1 CANCELED g/dL    Comment: Quantity was not sufficient for analysis.  Result canceled by the ancillary.    Alpha2 Glob SerPl Elph-Mcnc CANCELED g/dL    Comment: Quantity was not sufficient for analysis.  Result canceled by the ancillary.    B-Globulin SerPl Elph-Mcnc CANCELED g/dL    Comment: Quantity was not sufficient for analysis.  Result canceled by the ancillary.    Gamma Glob SerPl Elph-Mcnc CANCELED g/dL    Comment: Quantity was not sufficient for analysis.  Result canceled by the ancillary.    M Protein SerPl Elph-Mcnc CANCELED g/dL    Comment: Quantity was not sufficient for analysis.  Result canceled by the ancillary.    Globulin, Total CANCELED g/dL    Comment: Unable to calculate result since non-numeric result obtained for component test.  Result canceled by the ancillary.    Albumin/Glob SerPl CANCELED     Comment: Unable to calculate result since non-numeric result obtained for component test.  Result canceled by the ancillary.    IFE 1 CANCELED     Comment: Quantity was not sufficient for analysis.  Result canceled by the ancillary.    Please Note Comment     Comment: Protein electrophoresis scan will follow via computer, mail, or courier delivery.    PDF Comment    Ig Kappa Free Light Chain 19.7 (H) 3.3 - 19.4 mg/L   Ig Lambda Free Light Chain 10.2 5.7 - 26.3 mg/L   Kappa/Lambda FluidC Ratio 1.93 (H) 0.26 - 1.65  Vitamin D (25 hydroxy)     Status: Abnormal   Collection Time: 01/07/19 10:59 AM  Result Value Ref Range   Vit D, 25-Hydroxy 26.6 (L) 30.0 - 100.0 ng/mL    Comment: Vitamin D deficiency has been defined by the [place] practice  guideline as a level of serum 25-OH vitamin D less than 20 ng/mL (1,2). The Endocrine Society went on to further define vitamin D insufficiency as a level between 21 and 29 ng/mL (2). 1. IOM (Institute of Medicine). 2010. Dietary reference    intakes for calcium and D. [place]: The    [employer]. 2. Holick MF, Binkley Logan,  Bischoff-Ferrari HA, et al.    Evaluation, treatment, and prevention of vitamin D    deficiency: an Endocrine Society clinical practice    guideline. JCEM. 2011 Jul; 96(7):1911-30.   Novel Coronavirus, NAA ([facility])     Status: None   Collection Time: 01/22/19  2:09 PM  Result Value Ref Range   SARS-CoV-2, NAA Not Detected Not Detected    Comment: Testing was performed using the Aptima SARS-CoV-2 assay. This test was developed and its performance characteristics determined by [employer]. This test has not been FDA cleared or approved. This test has been authorized by FDA under an Emergency Use Authorization (EUA). This test is only authorized for the duration of time the declaration that circumstances exist justifying the authorization of the emergency use of in vitro diagnostic tests for detection of SARS-CoV-2 virus and/or diagnosis of COVID-19 infection under section 564(b)(1) of the Act, 21 U.S.C. 937DSK-8(J)(6), unless the authorization is terminated or revoked sooner. When diagnostic testing is negative, the possibility of a false negative result should be considered in the context of a patient's recent exposures and the presence of clinical signs and symptoms consistent with COVID-19. An individual without symptoms of COVID-19 and who is not shedding SARS-CoV-2 virus would expect to have a negativ e (not detected) result in this assay.     Radiology No results found.  Assessment/Plan S/P CABG x 5 No current angina  Dyslipidemia, goal LDL below 70 lipid control important in reducing the progression of atherosclerotic  disease. Continue statin therapy  Essential hypertension blood pressure control important in reducing the progression of atherosclerotic disease. On appropriate oral medications.  Diabetes mellitus without complication (HCC) blood glucose control important in reducing the progression of atherosclerotic disease. Also, involved in wound healing. On appropriate medications.   PAD (peripheral artery disease) His ABIs today are stable at 0.83 on the right and 0.96 on the left with biphasic waveforms distally essentially unchanged from his previous studies. Over time, his flows actually increased a bit.  He is no longer smoking and he is controlling his medical risk factors better.  At this point, we will continue annual surveillance.  No intervention is required for his mild symptoms at this time    Leotis Pain, MD  03/24/2019 2:53 PM    This note was created with Dragon medical transcription system.  Any errors from dictation are purely unintentional

## 2019-03-24 NOTE — Assessment & Plan Note (Signed)
blood glucose control important in reducing the progression of atherosclerotic disease. Also, involved in wound healing. On appropriate medications.  

## 2019-03-24 NOTE — Assessment & Plan Note (Signed)
His ABIs today are stable at 0.83 on the right and 0.96 on the left with biphasic waveforms distally essentially unchanged from his previous studies. Over time, his flows actually increased a bit.  He is no longer smoking and he is controlling his medical risk factors better.  At this point, we will continue annual surveillance.  No intervention is required for his mild symptoms at this time

## 2019-03-25 NOTE — Anesthesia Preprocedure Evaluation (Addendum)
Anesthesia Evaluation  Patient identified by MRN, date of birth, ID band Patient awake    Reviewed: Allergy & Precautions, NPO status , Patient's Chart, lab work & pertinent test results  History of Anesthesia Complications Negative for: history of anesthetic complications  Airway Mallampati: II  TM Distance: >3 FB Neck ROM: Limited    Dental   Pulmonary COPD, former smoker (quit 1994),    breath sounds clear to auscultation       Cardiovascular hypertension, (-) angina+ CAD, + CABG (S/p CABG 2016) and + Peripheral Vascular Disease (on Plavix, occlusion of the distal aorta)  (-) DOE + dysrhythmias (a fib) Atrial Fibrillation  Rhythm:Regular Rate:Normal   HLD  ECG 09/09/18: SR with PACs; possible left atrial enlargement  Stress test (05/2018): EF 52%, LVEF No evidence of ischemia  TTE (12/2016): LVEF 60% to 60% Grade 1 diastolic dysfunction Aortic valve: Valve area (Vmax): 1.84 cm^2. Ascending aorta: The ascending aorta was mildly dilated.   Neuro/Psych PSYCHIATRIC DISORDERS Anxiety Depression    GI/Hepatic neg GERD  ,  Endo/Other  diabetes, Type 2  Renal/GU Renal disease (hx nephrolithiasis)     Musculoskeletal  (+) Arthritis ,   Abdominal   Peds  Hematology   Anesthesia Other Findings   Reproductive/Obstetrics                          Anesthesia Physical Anesthesia Plan  ASA: III  Anesthesia Plan: MAC   Post-op Pain Management:    Induction: Intravenous  PONV Risk Score and Plan: 1 and TIVA and Midazolam  Airway Management Planned: Nasal Cannula  Additional Equipment:   Intra-op Plan:   Post-operative Plan:   Informed Consent: I have reviewed the patients History and Physical, chart, labs and discussed the procedure including the risks, benefits and alternatives for the proposed anesthesia with the patient or authorized representative who has indicated his/her  understanding and acceptance.       Plan Discussed with: CRNA and Anesthesiologist  Anesthesia Plan Comments:         Anesthesia Quick Evaluation

## 2019-03-26 ENCOUNTER — Other Ambulatory Visit
Admission: RE | Admit: 2019-03-26 | Discharge: 2019-03-26 | Disposition: A | Discharge status: Home / Self Care | Payer: Medicare Other | Source: Ambulatory Visit | Attending: Ophthalmology | Admitting: Ophthalmology

## 2019-03-26 ENCOUNTER — Other Ambulatory Visit: Payer: Self-pay

## 2019-03-26 DIAGNOSIS — Z20828 Contact with and (suspected) exposure to other viral communicable diseases: Secondary | ICD-10-CM | POA: Insufficient documentation

## 2019-03-26 DIAGNOSIS — Z01812 Encounter for preprocedural laboratory examination: Secondary | ICD-10-CM | POA: Insufficient documentation

## 2019-03-26 LAB — SARS CORONAVIRUS 2 (TAT 6-24 HRS): SARS Coronavirus 2: NEGATIVE

## 2019-03-27 ENCOUNTER — Other Ambulatory Visit: Payer: Self-pay | Admitting: Family Medicine

## 2019-03-27 DIAGNOSIS — G4700 Insomnia, unspecified: Secondary | ICD-10-CM

## 2019-03-27 NOTE — Discharge Instructions (Signed)

## 2019-03-27 NOTE — Telephone Encounter (Signed)
Pt wife called and stated that pt only has one pill left and would like this calling in as soon as possible. Please advise

## 2019-03-30 ENCOUNTER — Ambulatory Visit: Payer: Medicare Other | Admitting: Anesthesiology

## 2019-03-30 ENCOUNTER — Encounter: Payer: Self-pay | Admitting: Anesthesiology

## 2019-03-30 ENCOUNTER — Ambulatory Visit
Admission: RE | Admit: 2019-03-30 | Discharge: 2019-03-30 | Disposition: A | Discharge status: Home / Self Care | Payer: Medicare Other | Attending: Ophthalmology | Admitting: Ophthalmology

## 2019-03-30 ENCOUNTER — Encounter
Admission: RE | Disposition: A | Discharge status: Home / Self Care | Payer: Self-pay | Source: Home / Self Care | Attending: Ophthalmology

## 2019-03-30 DIAGNOSIS — G4730 Sleep apnea, unspecified: Secondary | ICD-10-CM | POA: Insufficient documentation

## 2019-03-30 DIAGNOSIS — F419 Anxiety disorder, unspecified: Secondary | ICD-10-CM | POA: Insufficient documentation

## 2019-03-30 DIAGNOSIS — E785 Hyperlipidemia, unspecified: Secondary | ICD-10-CM | POA: Diagnosis not present

## 2019-03-30 DIAGNOSIS — H2512 Age-related nuclear cataract, left eye: Secondary | ICD-10-CM | POA: Insufficient documentation

## 2019-03-30 DIAGNOSIS — E1136 Type 2 diabetes mellitus with diabetic cataract: Secondary | ICD-10-CM | POA: Insufficient documentation

## 2019-03-30 DIAGNOSIS — Z9049 Acquired absence of other specified parts of digestive tract: Secondary | ICD-10-CM | POA: Diagnosis not present

## 2019-03-30 DIAGNOSIS — Z7902 Long term (current) use of antithrombotics/antiplatelets: Secondary | ICD-10-CM | POA: Diagnosis not present

## 2019-03-30 DIAGNOSIS — H25812 Combined forms of age-related cataract, left eye: Secondary | ICD-10-CM | POA: Diagnosis not present

## 2019-03-30 DIAGNOSIS — M1990 Unspecified osteoarthritis, unspecified site: Secondary | ICD-10-CM | POA: Insufficient documentation

## 2019-03-30 DIAGNOSIS — F329 Major depressive disorder, single episode, unspecified: Secondary | ICD-10-CM | POA: Diagnosis not present

## 2019-03-30 DIAGNOSIS — J449 Chronic obstructive pulmonary disease, unspecified: Secondary | ICD-10-CM | POA: Insufficient documentation

## 2019-03-30 DIAGNOSIS — Z951 Presence of aortocoronary bypass graft: Secondary | ICD-10-CM | POA: Insufficient documentation

## 2019-03-30 DIAGNOSIS — I10 Essential (primary) hypertension: Secondary | ICD-10-CM | POA: Insufficient documentation

## 2019-03-30 DIAGNOSIS — Z87891 Personal history of nicotine dependence: Secondary | ICD-10-CM | POA: Diagnosis not present

## 2019-03-30 DIAGNOSIS — E1140 Type 2 diabetes mellitus with diabetic neuropathy, unspecified: Secondary | ICD-10-CM | POA: Insufficient documentation

## 2019-03-30 DIAGNOSIS — I2510 Atherosclerotic heart disease of native coronary artery without angina pectoris: Secondary | ICD-10-CM | POA: Diagnosis not present

## 2019-03-30 DIAGNOSIS — Z87442 Personal history of urinary calculi: Secondary | ICD-10-CM | POA: Insufficient documentation

## 2019-03-30 DIAGNOSIS — E7800 Pure hypercholesterolemia, unspecified: Secondary | ICD-10-CM | POA: Insufficient documentation

## 2019-03-30 HISTORY — PX: CATARACT EXTRACTION W/PHACO: SHX586

## 2019-03-30 SURGERY — PHACOEMULSIFICATION, CATARACT, WITH IOL INSERTION
Anesthesia: Monitor Anesthesia Care | Site: Eye | Laterality: Left

## 2019-03-30 MED ORDER — SODIUM HYALURONATE 23 MG/ML IO SOLN
INTRAOCULAR | Status: DC | PRN
  Administered 2019-03-30: 0.6 mL via INTRAOCULAR

## 2019-03-30 MED ORDER — ACETAMINOPHEN 10 MG/ML IV SOLN: 1000.0000 mg | Freq: Once | INTRAVENOUS | Status: DC | PRN

## 2019-03-30 MED ORDER — MIDAZOLAM HCL 2 MG/2ML IJ SOLN
INTRAMUSCULAR | Status: DC | PRN
  Administered 2019-03-30 (×2): 1 mg via INTRAVENOUS

## 2019-03-30 MED ORDER — ONDANSETRON HCL 4 MG/2ML IJ SOLN: 4.0000 mg | Freq: Once | INTRAMUSCULAR | Status: DC | PRN

## 2019-03-30 MED ORDER — ARMC OPHTHALMIC DILATING DROPS
1.0000 "application " | OPHTHALMIC | Status: DC | PRN
  Administered 2019-03-30 (×3): 1 via OPHTHALMIC

## 2019-03-30 MED ORDER — SODIUM HYALURONATE 10 MG/ML IO SOLN
INTRAOCULAR | Status: DC | PRN
  Administered 2019-03-30: 0.55 mL via INTRAOCULAR

## 2019-03-30 MED ORDER — TETRACAINE HCL 0.5 % OP SOLN
1.0000 [drp] | OPHTHALMIC | Status: DC | PRN
  Administered 2019-03-30 (×3): 1 [drp] via OPHTHALMIC

## 2019-03-30 MED ORDER — MOXIFLOXACIN HCL 0.5 % OP SOLN
OPHTHALMIC | Status: DC | PRN
  Administered 2019-03-30: 0.2 mL via OPHTHALMIC

## 2019-03-30 MED ORDER — LACTATED RINGERS IV SOLN: 100.0000 mL/h | INTRAVENOUS | Status: DC

## 2019-03-30 MED ORDER — FENTANYL CITRATE (PF) 100 MCG/2ML IJ SOLN
INTRAMUSCULAR | Status: DC | PRN
  Administered 2019-03-30 (×2): 50 ug via INTRAVENOUS

## 2019-03-30 MED ORDER — LIDOCAINE HCL (PF) 2 % IJ SOLN
INTRAOCULAR | Status: DC | PRN
  Administered 2019-03-30: 11:00:00 1 mL via INTRAOCULAR

## 2019-03-30 MED ORDER — EPINEPHRINE PF 1 MG/ML IJ SOLN
INTRAOCULAR | Status: DC | PRN
  Administered 2019-03-30: 86 mL via OPHTHALMIC

## 2019-03-30 SURGICAL SUPPLY — 19 items
CANNULA ANT/CHMB 27G (MISCELLANEOUS) ×2 IMPLANT
CANNULA ANT/CHMB 27GA (MISCELLANEOUS) ×4 IMPLANT
DISSECTOR HYDRO NUCLEUS 50X22 (MISCELLANEOUS) ×2 IMPLANT
GLOVE SURG LX 7.5 STRW (GLOVE) ×1
GLOVE SURG LX STRL 7.5 STRW (GLOVE) ×1 IMPLANT
GLOVE SURG SYN 8.5  E (GLOVE) ×1
GLOVE SURG SYN 8.5 E (GLOVE) ×1 IMPLANT
GLOVE SURG SYN 8.5 PF PI (GLOVE) ×1 IMPLANT
GOWN STRL REUS W/ TWL LRG LVL3 (GOWN DISPOSABLE) ×2 IMPLANT
GOWN STRL REUS W/TWL LRG LVL3 (GOWN DISPOSABLE) ×4
LENS IOL TECNIS ITEC 22.0 (Intraocular Lens) ×1 IMPLANT
MARKER SKIN DUAL TIP RULER LAB (MISCELLANEOUS) ×2 IMPLANT
PACK DR. KING ARMS (PACKS) ×2 IMPLANT
PACK EYE AFTER SURG (MISCELLANEOUS) ×2 IMPLANT
PACK OPTHALMIC (MISCELLANEOUS) ×2 IMPLANT
SYR 3ML LL SCALE MARK (SYRINGE) ×2 IMPLANT
SYR TB 1ML LUER SLIP (SYRINGE) ×2 IMPLANT
WATER STERILE IRR 250ML POUR (IV SOLUTION) ×2 IMPLANT
WIPE NON LINTING 3.25X3.25 (MISCELLANEOUS) ×2 IMPLANT

## 2019-03-30 NOTE — Transfer of Care (Signed)
Immediate Anesthesia Transfer of Care Note  Patient: [name]  Procedure(s) Performed: CATARACT EXTRACTION PHACO AND INTRAOCULAR LENS PLACEMENT (IOC) LEFT  00:54.3  8.4%  4.74 (Left Eye)  Patient Location: PACU  Anesthesia Type: MAC  Level of Consciousness: awake, alert  and patient cooperative  Airway and Oxygen Therapy: Patient Spontanous Breathing and Patient connected to supplemental oxygen  Post-op Assessment: Post-op Vital signs reviewed, Patient's Cardiovascular Status Stable, Respiratory Function Stable, Patent Airway and No signs of Nausea or vomiting  Post-op Vital Signs: Reviewed and stable  Complications: No apparent anesthesia complications

## 2019-03-30 NOTE — Anesthesia Postprocedure Evaluation (Signed)
Anesthesia Post Note  Patient: [name]  Procedure(s) Performed: CATARACT EXTRACTION PHACO AND INTRAOCULAR LENS PLACEMENT (IOC) LEFT  00:54.3  8.4%  4.74 (Left Eye)  Patient location during evaluation: PACU Anesthesia Type: MAC Level of consciousness: awake and alert Pain management: pain level controlled Vital Signs Assessment: post-procedure vital signs reviewed and stable Respiratory status: spontaneous breathing, nonlabored ventilation, respiratory function stable and patient connected to nasal cannula oxygen Cardiovascular status: stable and blood pressure returned to baseline Postop Assessment: no apparent nausea or vomiting Anesthetic complications: no    Akaisha Truman A  Areliz Rothman

## 2019-03-30 NOTE — Op Note (Signed)
OPERATIVE NOTE  [name] [id number] 03/30/2019   PREOPERATIVE DIAGNOSIS:  Nuclear sclerotic cataract left eye.  H25.12   POSTOPERATIVE DIAGNOSIS:    Nuclear sclerotic cataract left eye.     PROCEDURE:  Phacoemusification with posterior chamber intraocular lens placement of the left eye   LENS:   Implant Name Type Inv. Item Serial No. Manufacturer Lot No. LRB No. Used Action  LENS IOL DIOP 22.0 - [id number] Intraocular Lens LENS IOL DIOP 22.0 [id number] AMO  Left 1 Implanted      Procedure(s): CATARACT EXTRACTION PHACO AND INTRAOCULAR LENS PLACEMENT (IOC) LEFT  00:54.3  8.4%  4.74 (Left)    SURGEON:  Benay Pillow, MD, MPH   ANESTHESIA:  Topical with tetracaine drops augmented with 1% preservative-free intracameral lidocaine.  ESTIMATED BLOOD LOSS: <1 mL   COMPLICATIONS:  None.   DESCRIPTION OF PROCEDURE:  The patient was identified in the holding room and transported to the operating room and placed in the supine position under the operating microscope.  The left eye was identified as the operative eye and it was prepped and draped in the usual sterile ophthalmic fashion.   A 1.0 millimeter clear-corneal paracentesis was made at the 5:00 position. 0.5 ml of preservative-free 1% lidocaine with epinephrine was injected into the anterior chamber.  The anterior chamber was filled with Healon 5 viscoelastic.  A 2.4 millimeter keratome was used to make a near-clear corneal incision at the 2:00 position.  A curvilinear capsulorrhexis was made with a cystotome and capsulorrhexis forceps.  Balanced salt solution was used to hydrodissect and hydrodelineate the nucleus.   Phacoemulsification was then used in stop and chop fashion to remove the lens nucleus and epinucleus.  The remaining cortex was then removed using the irrigation and aspiration handpiece. Healon was then placed into the capsular bag to distend it for lens placement.  A lens was then injected into the capsular bag.  The  remaining viscoelastic was aspirated.   Wounds were hydrated with balanced salt solution.  The anterior chamber was inflated to a physiologic pressure with balanced salt solution.  Intracameral vigamox 0.1 mL undiltued was injected into the eye and a drop placed onto the ocular surface.  No wound leaks were noted.  The patient was taken to the recovery room in stable condition without complications of anesthesia or surgery  Benay Pillow 03/30/2019, 10:53 AM

## 2019-03-30 NOTE — Anesthesia Procedure Notes (Signed)
Procedure Name: MAC Performed by: Tirzah Fross M, CRNA Pre-anesthesia Checklist: Timeout performed, Patient being monitored, Suction available, Emergency Drugs available and Patient identified Patient Re-evaluated:Patient Re-evaluated prior to induction Oxygen Delivery Method: Nasal cannula       

## 2019-03-30 NOTE — H&P (Signed)

## 2019-03-31 ENCOUNTER — Encounter: Payer: Self-pay | Admitting: Ophthalmology

## 2019-03-31 NOTE — Telephone Encounter (Signed)
Patient states he is willing to do a sleep study if you could find him a neurologist that would put him to sleep for the study.  He also stated he has sores on his scalp that has come back and that before you sent him to a dermatologist and the one you referred him to tried to sell him makeup and he wanted a different dermatologist to look at his scalp.  Lakiyah Arntson,cma

## 2019-03-31 NOTE — Telephone Encounter (Signed)
I received another copy of his sleep study and an order for an AutoPap for the patient.  Please follow-up with him to see if he would be willing to consider using the CPAP if he would not have to go do an overnight sleep study.

## 2019-03-31 NOTE — Telephone Encounter (Signed)
I am not sure there is a neurologist that would do that. We can have him follow-up with the neurologist he saw previously to discuss options further. Can you see if he knows the name of the dermatologist he saw previously? Its not clear who he saw previously based on review of the chart. Thanks.

## 2019-04-02 ENCOUNTER — Other Ambulatory Visit: Payer: Self-pay

## 2019-04-02 ENCOUNTER — Ambulatory Visit: Payer: Self-pay

## 2019-04-02 ENCOUNTER — Ambulatory Visit (INDEPENDENT_AMBULATORY_CARE_PROVIDER_SITE_OTHER): Payer: Medicare Other | Admitting: Family Medicine

## 2019-04-02 ENCOUNTER — Encounter: Payer: Self-pay | Admitting: Family Medicine

## 2019-04-02 VITALS — BP 100/70 | HR 89 | Ht 68.5 in

## 2019-04-02 DIAGNOSIS — R351 Nocturia: Secondary | ICD-10-CM | POA: Diagnosis not present

## 2019-04-02 DIAGNOSIS — M25552 Pain in left hip: Secondary | ICD-10-CM

## 2019-04-02 DIAGNOSIS — N401 Enlarged prostate with lower urinary tract symptoms: Secondary | ICD-10-CM | POA: Diagnosis not present

## 2019-04-02 DIAGNOSIS — M7061 Trochanteric bursitis, right hip: Secondary | ICD-10-CM

## 2019-04-02 DIAGNOSIS — M7062 Trochanteric bursitis, left hip: Secondary | ICD-10-CM | POA: Diagnosis not present

## 2019-04-02 DIAGNOSIS — Z87442 Personal history of urinary calculi: Secondary | ICD-10-CM | POA: Diagnosis not present

## 2019-04-02 DIAGNOSIS — I208 Other forms of angina pectoris: Secondary | ICD-10-CM | POA: Diagnosis not present

## 2019-04-02 DIAGNOSIS — M5416 Radiculopathy, lumbar region: Secondary | ICD-10-CM

## 2019-04-02 DIAGNOSIS — R29898 Other symptoms and signs involving the musculoskeletal system: Secondary | ICD-10-CM

## 2019-04-02 NOTE — Progress Notes (Signed)
Marc ScaleZach Aquinnah Devin D.O. [facility] [address] [place], [place] [address] Phone: [phone] Subjective:   I Marc NighKana [name] am serving as a Neurosurgeonscribe for Dr. Antoine PrimasZachary Malayzia Laforte.  I'm seeing this patient by the request  of:    CC: Bilateral hip pain  OZH:[id number]:Subjective   01/15/2019 Bilateral greater trochanteric injections given today.  Tolerated the procedures well.  Has done fairly well with these.  Does not want to do anymore interventions such as looking at the back anymore.  No change in medications per patient.  Patient Injections every 3 to 4 months if needed.  Follow-up again in 12 weeks.  04/02/2019 Marc HartFrank [name] is a 73 y.o. male coming in with complaint of bilateral hip pain. Hips are not doing any better. Left knee is also painful.   Onset-  Location -lateral hip Duration-  Character-no, throbbing aching pain and hurts more at night Aggravating factors- walking also laying on either hip at night Reliving factors-  Therapies tried-some mild icing.  Seem to do better when he was exercising regularly but has not been able to secondary to the gym is been closed Severity-9-10     Past Medical History:  Diagnosis Date  . Arthritis   . CAD, multiple vessel 03/2105   LM 65%, mLAD 70%, 90% & 80%, o-pCx 75%, mCx 95% & d Cx 70%, OM1 60%, mRCA 90% with 100% dRCA. R-R collaterals  . Chronic distal aortic occlusion (HCC)    By report  . Chronic kidney disease   . COPD (chronic obstructive pulmonary disease) (HCC)   . Depression   . Diabetes mellitus without complication (HCC) 08/20/2018  . Enlarged prostate   . Erectile dysfunction   . Essential hypertension   . History of kidney stones    multiple lithotripsies  . Hyperlipidemia LDL goal <70   . Hypogonadism in male    Previously on testosterone injections, but has not yet restarted until further testing  . Muscle pain   . PAF (paroxysmal atrial fibrillation) (HCC) 03/2015   Noted per-cath & post-op CABG.  Dr. Herbie BaltimoreHarding, cardiology   . Peripheral vascular disease (HCC)    Bilateral ABIs 0.8  . Renal stones 03/2016  . S/P CABG x 5 04/04/2015   LIMA to LAD, SVG to DIAG, SVG to OM, seqSVG -PDA- dCx  . SOB (shortness of breath)   . 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 Weakness    weakness bilateral legs, s/p vein harvesting for CABPG   Past Surgical History:  Procedure Laterality Date  . 48-HOUR HOLTER MONITOR  02/2018   Sinus rhythm with some sinus bradycardia and sinus tachycardia.  Rare PVCs.  3 episodes of trigeminy for a total of 51 beats.  Frequent PACs no runs, but 3 pairs noted.  12 bigeminal episodes noted.  No arrhythmia.  Minimum heart rate 55 bpm, max 135 bpm. ->  Reportedly had 2 events while wearing monitor, but no obvious abnormality associated  . BLEPHAROPLASTY    . CARDIAC CATHETERIZATION  1992  . CARDIAC CATHETERIZATION N/A 03/31/2015   Procedure: Left Heart Cath and Coronary Angiography;  Surgeon: Marykay Lexavid W Harding, MD;  [facility]: LM 65%, mLAD 70%, 90% & 80%, o-pCx 75%, mCx 95% & d Cx 70%, OM1 60%, mRCA 90% with 100% dRCA. R-R collaterals --> CABG referral. Also noted to be in Afib.  . Carotid Dopplers  09/2017   Patent vertebrals. Mild bilateral carotid plaque, <50%  stenosis bilaterally.  Marland Kitchen CATARACT EXTRACTION W/PHACO Left 03/30/2019   Procedure: CATARACT EXTRACTION PHACO AND INTRAOCULAR LENS PLACEMENT (IOC) LEFT  00:54.3  8.4%  4.74;  Surgeon: Eulogio Bear, MD;  Location: [facility];  Service: Ophthalmology;  Laterality: Left;  . CHOLECYSTECTOMY  2000   also had revision of hernia repair  . CORONARY ARTERY BYPASS GRAFT N/A 04/04/2015   Procedure: CORONARY ARTERY BYPASS GRAFTING (CABG);  Surgeon: Grace Isaac, MD;  Location: [facility];  Service: Open Heart Surgery;  Laterality: N/A;  . CYSTECTOMY     face and scrotum  . CYSTOSCOPY WITH INSERTION OF UROLIFT N/A 11/12/2016   Procedure: CYSTOSCOPY WITH INSERTION OF UROLIFT x4;   Surgeon: Carolan Clines, MD;  Location: [facility];  Service: Urology;  Laterality: N/A;  . CYSTOSCOPY WITH RETROGRADE PYELOGRAM, URETEROSCOPY AND STENT PLACEMENT Right 06/26/2016   Procedure: CYSTOSCOPY WITH RIGHT RETROGRADE PYELOGRAM, RIGHT URETEROSCOPY , HOLMIUM LASER, BASKET EXTRACTION AND STENT PLACEMENT;  Surgeon: Carolan Clines, MD;  Location: [facility];  Service: Urology;  Laterality: Right;  . CYSTOSCOPY WITH RETROGRADE PYELOGRAM, URETEROSCOPY AND STENT PLACEMENT Left 09/23/2017   Procedure: CYSTOSCOPY WITH LEFT RETROGRADE PYELOGRAM, URETEROSCOPY STONE EXTRACTION AND STENT PLACEMENT;  Surgeon: Lucas Mallow, MD;  Location: [facility];  Service: Urology;  Laterality: Left;  . LEXISCAN MYOVIEW  June 2010   converted to [facility] from treadmill due to inability to reach target heart rate) --the study was normal with no evidence of ischemia and normal LV function. EF 7  . LITHOTRIPSY    . NM MYOVIEW LTD  05/2018   LOW RISK EF 45 to 54%.  Small fixed mild severity defect in the apex which could present either subendocardial infarct versus artifact.  No evidence of ischemia.  . TEE WITHOUT CARDIOVERSION N/A 04/04/2015   Procedure: TRANSESOPHAGEAL ECHOCARDIOGRAM (TEE);  Surgeon: Grace Isaac, MD;  Location: [facility];  Service: Open Heart Surgery;  Laterality: N/A;  . TRANSTHORACIC ECHOCARDIOGRAM  12/30/2016   EF 60-65%. GR 1 DD. Mildly dilated ascending aorta. Otherwise essentially normal with no valve lesions.  Marland Kitchen VASECTOMY  1975  . VENTRAL HERNIA REPAIR  1610   complicated by infection   Social History   Socioeconomic History  . Marital status: Married    Spouse name: Not on file  . Number of children: 2  . Years of education: 39  . Highest education level: Not on file  Occupational History  . Not on file  Social Needs  . Financial resource strain: Not hard at all  . Food insecurity    Worry: Never true    Inability: Never true  . Transportation needs    Medical: No     Non-medical: No  Tobacco Use  . Smoking status: Former Smoker    Packs/day: 2.00    Years: 40.00    Pack years: 80.00    Types: Cigarettes    Quit date: 10/17/1992    Years since quitting: 26.4  . Smokeless tobacco: Never Used  . Tobacco comment: quit 1994  Substance and Sexual Activity  . Alcohol use: Yes    Alcohol/week: 1.0 standard drinks    Types: 1 Standard drinks or equivalent per week    Comment: seldom - vodka or wine (3-4x/yr)  . Drug use: No  . Sexual activity: Never  Lifestyle  . Physical activity    Days per week: 5 days    Minutes per session: 10 min  . Stress: Not at all  Relationships  .  Social Musicianconnections    Talks on phone: Not on file    Gets together: Not on file    Attends religious service: Not on file    Active member of club or organization: Not on file    Attends meetings of clubs or organizations: Not on file    Relationship status: Not on file  Other Topics Concern  . Not on file  Social History Narrative   [place] in [place] with [place]. No pets   Two children.      Former smoker. Quit in 1994. Prior to quitting was unable to walk without exertional dyspnea.      Work - Retired. 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      Hobbies - reading, drives Corvette, chuch   No Known Allergies Family History  Problem Relation Age of Onset  . Coronary artery disease Father   . Heart disease Father   . Stroke Mother   . Hearing loss Mother   . Stroke Sister   . Hearing loss Maternal Grandmother   . Prostate cancer Neg Hx   . Kidney disease Neg Hx   . Other Neg Hx        hypogonadism     Current Outpatient Medications (Cardiovascular):  .  midodrine (PROAMATINE) 5 MG tablet, Take 1 tablet (5 mg total) by mouth 2 (two) times daily with a meal. .  isosorbide mononitrate (IMDUR) 30 MG 24 hr tablet, Take 1 tablet (30 mg total) by mouth daily.  Current Outpatient Medications (Respiratory):   .  albuterol (PROVENTIL HFA;VENTOLIN HFA) 108 (90 Base) MCG/ACT inhaler, Inhale 1-2 puffs into the lungs every 4 (four) hours as needed for wheezing or shortness of breath. .  diphenhydrAMINE-PSE-APAP ([employer] ALLERGY SINUS HEADACHE PO), Take by mouth as needed.  Current Outpatient Medications (Analgesics):  .  acetaminophen (TYLENOL) 650 MG CR tablet, Take 650 mg by mouth every 8 (eight) hours as needed for pain.  Current Outpatient Medications (Hematological):  .  clopidogrel (PLAVIX) 75 MG tablet, TAKE 1 TABLET EVERY DAY  Current Outpatient Medications (Other):  Marland Kitchen.  ALPRAZolam (XANAX) 0.5 MG tablet, Take 1-2 pills as needed on call to MRI. .  eszopiclone (LUNESTA) 1 MG TABS tablet, TAKE 1 TABLET BY MOUTH AT BEDTIME AS NEEDED FOR SLEEP IMMEDIATELY BEFORE BEDTIME .  tamsulosin (FLOMAX) 0.4 MG CAPS capsule, Take 0.4 mg by mouth daily.     Past medical history, social, surgical and family history all reviewed in electronic medical record.  No pertanent information unless stated regarding to the chief complaint.   Review of Systems:  No headache, visual changes, nausea, vomiting, diarrhea, constipation, dizziness, abdominal pain, skin rash, fevers, chills, night sweats, weight loss, swollen lymph nodes,chest pain, shortness of breath, mood changes.  Muscle aches and body aches  Objective  Blood pressure 100/70, pulse 89, height 5' 8.5" (1.74 m), SpO2 97 %. Systems examined below as of    General: No apparent distress alert and oriented x3 mood and affect normal, dressed appropriately.  Masked facies HEENT: Pupils equal, extraocular movements intact  Respiratory: Patient's speak in full sentences and does not appear short of breath  Cardiovascular: No lower extremity edema, non tender, no erythema  Skin: Warm dry intact with no signs of infection or rash on extremities or on axial skeleton.  Abdomen: Soft nontender  Neuro: Cranial nerves II through XII are intact, neurovascularly intact in  all extremities with 2+ DTRs and  2+ pulses.  Lymph: No lymphadenopathy of posterior or anterior cervical chain or axillae bilaterally.  Gait shuffling gait MSK:  tender with full range of motion and good stability and symmetric strength and tone of shoulders, elbows, wrist,  knee and ankles bilaterally.  Tremor of the right upper extremity noted Hip exam bilaterally does have severe tenderness to palpation over the greater trochanteric area and does have hypertonicity of the back musculature tightness with straight leg test but no radicular symptoms.   Procedure: Real-time Ultrasound Guided Injection of right greater trochanteric bursitis secondary to patient's body habitus Device: GE Logiq Q7 Ultrasound guided injection is preferred based studies that show increased duration, increased effect, greater accuracy, decreased procedural pain, increased response rate, and decreased cost with ultrasound guided versus blind injection.  Verbal informed consent obtained.  Time-out conducted.  Noted no overlying erythema, induration, or other signs of local infection.  Skin prepped in a sterile fashion.  Local anesthesia: Topical Ethyl chloride.  With sterile technique and under real time ultrasound guidance:  Greater trochanteric area was visualized and patient's bursa was noted. A 22-gauge 3 inch needle was inserted and 4 cc of 0.5% Marcaine and 1 cc of Kenalog 40 mg/dL was injected. Pictures taken Completed without difficulty  Pain immediately resolved suggesting accurate placement of the medication.  Advised to call if fevers/chills, erythema, induration, drainage, or persistent bleeding.  Images permanently stored and available for review in the ultrasound unit.  Impression: Technically successful ultrasound guided injection.   Procedure: Real-time Ultrasound Guided Injection of left  greater trochanteric bursitis secondary to patient's body habitus Device: GE Logiq Q7  Ultrasound guided  injection is preferred based studies that show increased duration, increased effect, greater accuracy, decreased procedural pain, increased response rate, and decreased cost with ultrasound guided versus blind injection.  Verbal informed consent obtained.  Time-out conducted.  Noted no overlying erythema, induration, or other signs of local infection.  Skin prepped in a sterile fashion.  Local anesthesia: Topical Ethyl chloride.  With sterile technique and under real time ultrasound guidance:  Greater trochanteric area was visualized and patient's bursa was noted. A 22-gauge 3 inch needle was inserted and 4 cc of 0.5% Marcaine and 1 cc of Kenalog 40 mg/dL was injected. Pictures taken Completed without difficulty  Pain immediately resolved suggesting accurate placement of the medication.  Advised to call if fevers/chills, erythema, induration, drainage, or persistent bleeding.  Images permanently stored and available for review in the ultrasound unit.  Impression: Technically successful ultrasound guided injection.   Impression and Recommendations:     This case required medical decision making of moderate complexity. The above documentation has been reviewed and is accurate and complete Judi Saa, DO       Note: This dictation was prepared with Dragon dictation along with smaller phrase technology. Any transcriptional errors that result from this process are unintentional.

## 2019-04-02 NOTE — Assessment & Plan Note (Signed)
Repeat injection given again today.  Discussed we can repeat every 10 weeks.  Patient feels like he is making progress.  Still concerned in my opinion is more of the lumbar spine causing it.  Patient does not want any surgical intervention and does not want to have any type of epidural in the back again.  Follow-up with me again in 10 to 12 weeks

## 2019-04-06 DIAGNOSIS — R109 Unspecified abdominal pain: Secondary | ICD-10-CM | POA: Diagnosis not present

## 2019-04-06 DIAGNOSIS — Z87442 Personal history of urinary calculi: Secondary | ICD-10-CM | POA: Diagnosis not present

## 2019-04-08 DIAGNOSIS — H2511 Age-related nuclear cataract, right eye: Secondary | ICD-10-CM | POA: Diagnosis not present

## 2019-04-09 NOTE — Telephone Encounter (Signed)
Patient states that he did not want to go see a neurologist to get more options he wanted to know if you could ask around for this instead of him having to go out and see someone.  Patient also states he did not know the dermatologist name he stated they [place] a popular group [place], he could not remember names because it has been 2 years or more.  Nina,cma

## 2019-04-11 NOTE — Telephone Encounter (Signed)
Dermatology referral placed.  I will send his request to the neurologist he saw previously.  We will see what she has to say about this.

## 2019-04-11 NOTE — Addendum Note (Signed)
Addended by: Leone Haven on: 04/11/2019 03:03 PM   Modules accepted: Orders

## 2019-04-13 ENCOUNTER — Encounter: Payer: Self-pay | Admitting: *Deleted

## 2019-04-13 ENCOUNTER — Other Ambulatory Visit: Payer: Self-pay

## 2019-04-13 NOTE — Telephone Encounter (Signed)
I would be happy to evaluate him for underlying sleep apnea. I do not recommend prescribing a sleep aid for the sleep study. Please encourage patient to call us any time to make an appointment. He could try melatonin for sleep at home and could bring it for his sleep study.  His HST from July was negative for OSA. He had another HST in June, which showed mild/borderline OSA.

## 2019-04-13 NOTE — Telephone Encounter (Signed)
Dr Rexene Alberts, Thank you for responding. I will let the patient know to contact your office to schedule follow-up.   Gae Bon, please contact the patient and let him know that I reached out to the neurologist he saw previously. They did not recommend using a sleep aid for the sleep study though did not he could try melatonin for sleep in home and could bring it for his sleep study. They advised that he follow-up with them for evaluation and he should contact Dr Guadelupe Sabin office to set up follow-up. Thanks.

## 2019-04-14 NOTE — Anesthesia Preprocedure Evaluation (Addendum)
Anesthesia Evaluation    History of Anesthesia Complications Negative for: history of anesthetic complications  Airway Mallampati: II  TM Distance: >3 FB Neck ROM: Limited    Dental   Pulmonary COPD, former smoker,           Cardiovascular hypertension, + CAD, + CABG (S/p CABG 2016) and + Peripheral Vascular Disease (on Plavix, occlusion of the distal aorta)  + dysrhythmias (a fib) Atrial Fibrillation    HLD  ECG 09/09/18: SR with PACs; possible left atrial enlargement  Stress test (05/2018): EF 52%, LVEF No evidence of ischemia  TTE (12/2016): LVEF 60% to 06% Grade 1 diastolic dysfunction Aortic valve: Valve area (Vmax): 1.84 cm^2. Ascending aorta: The ascending aorta was mildly dilated.   Neuro/Psych PSYCHIATRIC DISORDERS Anxiety Depression    GI/Hepatic   Endo/Other  diabetes, Type 2  Renal/GU Renal disease (hx nephrolithiasis)     Musculoskeletal  (+) Arthritis ,   Abdominal   Peds  Hematology   Anesthesia Other Findings   Reproductive/Obstetrics                             Anesthesia Physical  Anesthesia Plan  ASA: III  Anesthesia Plan: MAC   Post-op Pain Management:    Induction: Intravenous  PONV Risk Score and Plan: 1 and TIVA and Midazolam  Airway Management Planned: Nasal Cannula  Additional Equipment:   Intra-op Plan:   Post-operative Plan:   Informed Consent:   Plan Discussed with:   Anesthesia Plan Comments:         Anesthesia Quick Evaluation

## 2019-04-15 ENCOUNTER — Telehealth: Payer: Self-pay

## 2019-04-15 NOTE — Telephone Encounter (Signed)
Copied from CRM #[id number]. Topic: Quick Communication - Office Called Patient (Clinic Use ONLY) >> Apr 15, 2019  1:31 PM Gonzalez, Nina D, CMA wrote:  please contact the patient and let him know that I reached out to the neurologist he saw previously. They did not recommend using a sleep aid for the sleep study though he did not  he could try melatonin for sleep in home and could bring it for his sleep study. They advised that he follow-up with them for evaluation and he should contact Dr Athar's office to set up follow-up. Thanks.  Nina,cma >> Apr 15, 2019  1:41 PM Alexander, Amber L wrote: Pt states that he is not having a sleep study he is having an MRI and is claustrophobic and needs medication for that. Pt would like another call back. 

## 2019-04-15 NOTE — Telephone Encounter (Signed)
Called patient.  No answer.  Left a detailed message regarding advisement in previous note for taking sleep aid during sleep study (ok to leave detailed message per [facility]).  Requested patient to call back if he had any questions.

## 2019-04-15 NOTE — Telephone Encounter (Signed)
Please see note below from [facility]:  Patient is not having a sleep study but an MRI and is claustrophobic and needs meds.  Pt would like another call back.

## 2019-04-15 NOTE — Telephone Encounter (Signed)
Copied from [place] [phone]. Topic: Conservator, museum/gallery Patient (Clinic Use ONLY) >> Apr 15, 2019  1:31 PM Gordy Councilman, CMA wrote:  please contact the patient and let him know that I reached out to the neurologist he saw previously. They did not recommend using a sleep aid for the sleep study though he did not  he could try melatonin for sleep in home and could bring it for his sleep study. They advised that he follow-up with them for evaluation and he should contact Dr Guadelupe Sabin office to set up follow-up. Thanks.  Nina,cma >> Apr 15, 2019  1:41 PM Reyne Dumas L wrote: Pt states that he is not having a sleep study he is having an MRI and is claustrophobic and needs medication for that. Pt would like another call back.

## 2019-04-15 NOTE — Telephone Encounter (Signed)
I called the patient and left a voicemail to return a call.  please contact the patient and let him know that I reached out to the neurologist he saw previously. They did not recommend using a sleep aid for the sleep study though he did not  he could try melatonin for sleep in home and could bring it for his sleep study. They advised that he follow-up with them for evaluation and he should contact Dr Guadelupe Sabin office to set up follow-up. Thanks.   pec ok to advise.     Lucilia Yanni,cma

## 2019-04-16 ENCOUNTER — Other Ambulatory Visit: Payer: Self-pay

## 2019-04-16 ENCOUNTER — Other Ambulatory Visit
Admission: RE | Admit: 2019-04-16 | Discharge: 2019-04-16 | Disposition: A | Discharge status: Home / Self Care | Payer: Medicare Other | Source: Ambulatory Visit | Attending: Ophthalmology | Admitting: Ophthalmology

## 2019-04-16 DIAGNOSIS — Z01812 Encounter for preprocedural laboratory examination: Secondary | ICD-10-CM | POA: Diagnosis not present

## 2019-04-16 DIAGNOSIS — Z20828 Contact with and (suspected) exposure to other viral communicable diseases: Secondary | ICD-10-CM | POA: Diagnosis not present

## 2019-04-16 LAB — SARS CORONAVIRUS 2 (TAT 6-24 HRS): SARS Coronavirus 2: NEGATIVE

## 2019-04-16 NOTE — Discharge Instructions (Signed)

## 2019-04-17 NOTE — Telephone Encounter (Signed)
Patient is not having a sleep study but an MRI and is claustrophobic and needs meds.  Pt would like another call back.

## 2019-04-18 NOTE — Telephone Encounter (Signed)
It looks like the neurologist ordered the MRI. I would suggest that he contact them to discuss the need for medication for the MRI. The MRI also does not appear to have been scheduled yet. He needs to contact the neurology office get this scheduled.

## 2019-04-20 ENCOUNTER — Ambulatory Visit: Payer: Medicare Other | Admitting: Anesthesiology

## 2019-04-20 ENCOUNTER — Ambulatory Visit
Admission: RE | Admit: 2019-04-20 | Discharge: 2019-04-20 | Disposition: A | Discharge status: Home / Self Care | Payer: Medicare Other | Attending: Ophthalmology | Admitting: Ophthalmology

## 2019-04-20 ENCOUNTER — Other Ambulatory Visit: Payer: Self-pay

## 2019-04-20 ENCOUNTER — Encounter
Admission: RE | Disposition: A | Discharge status: Home / Self Care | Payer: Self-pay | Source: Home / Self Care | Attending: Ophthalmology

## 2019-04-20 DIAGNOSIS — I4891 Unspecified atrial fibrillation: Secondary | ICD-10-CM | POA: Insufficient documentation

## 2019-04-20 DIAGNOSIS — E1151 Type 2 diabetes mellitus with diabetic peripheral angiopathy without gangrene: Secondary | ICD-10-CM | POA: Insufficient documentation

## 2019-04-20 DIAGNOSIS — I2510 Atherosclerotic heart disease of native coronary artery without angina pectoris: Secondary | ICD-10-CM | POA: Insufficient documentation

## 2019-04-20 DIAGNOSIS — Z87891 Personal history of nicotine dependence: Secondary | ICD-10-CM | POA: Diagnosis not present

## 2019-04-20 DIAGNOSIS — Z7902 Long term (current) use of antithrombotics/antiplatelets: Secondary | ICD-10-CM | POA: Insufficient documentation

## 2019-04-20 DIAGNOSIS — H2511 Age-related nuclear cataract, right eye: Secondary | ICD-10-CM | POA: Diagnosis not present

## 2019-04-20 DIAGNOSIS — G4730 Sleep apnea, unspecified: Secondary | ICD-10-CM | POA: Diagnosis not present

## 2019-04-20 DIAGNOSIS — Z79899 Other long term (current) drug therapy: Secondary | ICD-10-CM | POA: Insufficient documentation

## 2019-04-20 DIAGNOSIS — J449 Chronic obstructive pulmonary disease, unspecified: Secondary | ICD-10-CM | POA: Diagnosis not present

## 2019-04-20 DIAGNOSIS — E1136 Type 2 diabetes mellitus with diabetic cataract: Secondary | ICD-10-CM | POA: Insufficient documentation

## 2019-04-20 DIAGNOSIS — I10 Essential (primary) hypertension: Secondary | ICD-10-CM | POA: Diagnosis not present

## 2019-04-20 DIAGNOSIS — Z951 Presence of aortocoronary bypass graft: Secondary | ICD-10-CM | POA: Insufficient documentation

## 2019-04-20 DIAGNOSIS — H25811 Combined forms of age-related cataract, right eye: Secondary | ICD-10-CM | POA: Diagnosis not present

## 2019-04-20 HISTORY — PX: CATARACT EXTRACTION W/PHACO: SHX586

## 2019-04-20 SURGERY — PHACOEMULSIFICATION, CATARACT, WITH IOL INSERTION
Anesthesia: Monitor Anesthesia Care | Site: Eye | Laterality: Right

## 2019-04-20 MED ORDER — MOXIFLOXACIN HCL 0.5 % OP SOLN
OPHTHALMIC | Status: DC | PRN
  Administered 2019-04-20: 0.2 mL via OPHTHALMIC

## 2019-04-20 MED ORDER — LIDOCAINE HCL (PF) 2 % IJ SOLN
INTRAOCULAR | Status: DC | PRN
  Administered 2019-04-20: 1 mL via INTRAOCULAR

## 2019-04-20 MED ORDER — FENTANYL CITRATE (PF) 100 MCG/2ML IJ SOLN
INTRAMUSCULAR | Status: DC | PRN
  Administered 2019-04-20: 50 ug via INTRAVENOUS

## 2019-04-20 MED ORDER — EPINEPHRINE PF 1 MG/ML IJ SOLN
INTRAOCULAR | Status: DC | PRN
  Administered 2019-04-20: 141 mL via OPHTHALMIC

## 2019-04-20 MED ORDER — TETRACAINE HCL 0.5 % OP SOLN
1.0000 [drp] | OPHTHALMIC | Status: DC | PRN
  Administered 2019-04-20 (×3): 1 [drp] via OPHTHALMIC

## 2019-04-20 MED ORDER — ACETAMINOPHEN 160 MG/5ML PO SOLN: 325.0000 mg | ORAL | Status: DC | PRN

## 2019-04-20 MED ORDER — ARMC OPHTHALMIC DILATING DROPS
1.0000 "application " | OPHTHALMIC | Status: DC | PRN
  Administered 2019-04-20 (×2): 1 via OPHTHALMIC

## 2019-04-20 MED ORDER — ACETAMINOPHEN 325 MG PO TABS: 325.0000 mg | ORAL_TABLET | ORAL | Status: DC | PRN

## 2019-04-20 MED ORDER — SODIUM HYALURONATE 10 MG/ML IO SOLN
INTRAOCULAR | Status: DC | PRN
  Administered 2019-04-20: 0.55 mL via INTRAOCULAR

## 2019-04-20 MED ORDER — MIDAZOLAM HCL 2 MG/2ML IJ SOLN
INTRAMUSCULAR | Status: DC | PRN
  Administered 2019-04-20: 1 mg via INTRAVENOUS

## 2019-04-20 MED ORDER — SODIUM HYALURONATE 23 MG/ML IO SOLN
INTRAOCULAR | Status: DC | PRN
  Administered 2019-04-20: 0.6 mL via INTRAOCULAR

## 2019-04-20 SURGICAL SUPPLY — 19 items
CANNULA ANT/CHMB 27G (MISCELLANEOUS) ×2 IMPLANT
CANNULA ANT/CHMB 27GA (MISCELLANEOUS) ×4 IMPLANT
DISSECTOR HYDRO NUCLEUS 50X22 (MISCELLANEOUS) ×2 IMPLANT
GLOVE SURG LX 7.5 STRW (GLOVE) ×1
GLOVE SURG LX STRL 7.5 STRW (GLOVE) ×1 IMPLANT
GLOVE SURG SYN 8.5  E (GLOVE) ×1
GLOVE SURG SYN 8.5 E (GLOVE) ×1 IMPLANT
GLOVE SURG SYN 8.5 PF PI (GLOVE) ×1 IMPLANT
GOWN STRL REUS W/ TWL LRG LVL3 (GOWN DISPOSABLE) ×2 IMPLANT
GOWN STRL REUS W/TWL LRG LVL3 (GOWN DISPOSABLE) ×4
LENS IOL TECNIS ITEC 22.0 (Intraocular Lens) ×1 IMPLANT
MARKER SKIN DUAL TIP RULER LAB (MISCELLANEOUS) ×2 IMPLANT
PACK DR. KING ARMS (PACKS) ×2 IMPLANT
PACK EYE AFTER SURG (MISCELLANEOUS) ×2 IMPLANT
PACK OPTHALMIC (MISCELLANEOUS) ×2 IMPLANT
SYR 3ML LL SCALE MARK (SYRINGE) ×2 IMPLANT
SYR TB 1ML LUER SLIP (SYRINGE) ×2 IMPLANT
WATER STERILE IRR 250ML POUR (IV SOLUTION) ×2 IMPLANT
WIPE NON LINTING 3.25X3.25 (MISCELLANEOUS) ×2 IMPLANT

## 2019-04-20 NOTE — Anesthesia Postprocedure Evaluation (Signed)
Anesthesia Post Note  Patient: [name]  Procedure(s) Performed: CATARACT EXTRACTION PHACO AND INTRAOCULAR LENS PLACEMENT (IOC) RIGHT DIABETiC  03:10.7  13.8%  26.47 (Right Eye)  Patient location during evaluation: PACU Anesthesia Type: MAC Level of consciousness: awake and alert Pain management: pain level controlled Vital Signs Assessment: post-procedure vital signs reviewed and stable Respiratory status: spontaneous breathing, nonlabored ventilation, respiratory function stable and patient connected to nasal cannula oxygen Cardiovascular status: stable and blood pressure returned to baseline Postop Assessment: no apparent nausea or vomiting Anesthetic complications: no    Alisa Graff

## 2019-04-20 NOTE — Anesthesia Procedure Notes (Signed)
Procedure Name: MAC Date/Time: 04/20/2019 7:30 AM Performed by: Cameron Ali, CRNA Pre-anesthesia Checklist: Patient identified, Emergency Drugs available, Suction available, Timeout performed and Patient being monitored Patient Re-evaluated:Patient Re-evaluated prior to induction Oxygen Delivery Method: Nasal cannula Placement Confirmation: positive ETCO2

## 2019-04-20 NOTE — Transfer of Care (Signed)
Immediate Anesthesia Transfer of Care Note  Patient: [name]  Procedure(s) Performed: CATARACT EXTRACTION PHACO AND INTRAOCULAR LENS PLACEMENT (IOC) RIGHT DIABETiC  03:10.7  13.8%  26.47 (Right Eye)  Patient Location: PACU  Anesthesia Type: MAC  Level of Consciousness: awake, alert  and patient cooperative  Airway and Oxygen Therapy: Patient Spontanous Breathing and Patient connected to supplemental oxygen  Post-op Assessment: Post-op Vital signs reviewed, Patient's Cardiovascular Status Stable, Respiratory Function Stable, Patent Airway and No signs of Nausea or vomiting  Post-op Vital Signs: Reviewed and stable  Complications: No apparent anesthesia complications

## 2019-04-20 NOTE — H&P (Signed)

## 2019-04-20 NOTE — Op Note (Signed)
OPERATIVE NOTE  [name] [id number] 04/20/2019   PREOPERATIVE DIAGNOSIS:  Nuclear sclerotic cataract right eye.  H25.11   POSTOPERATIVE DIAGNOSIS:    Nuclear sclerotic cataract right eye.     PROCEDURE:  Phacoemusification with posterior chamber intraocular lens placement of the right eye   LENS:   Implant Name Type Inv. Item Serial No. Manufacturer Lot No. LRB No. Used Action  LENS IOL DIOP 22.0 - [id number] Intraocular Lens LENS IOL DIOP 22.0 [id number] AMO  Right 1 Implanted       Procedure(s) with comments: CATARACT EXTRACTION PHACO AND INTRAOCULAR LENS PLACEMENT (IOC) RIGHT DIABETiC  03:10.7  13.8%  26.47 (Right) - Diabetic - diet controlled   SURGEON:  Benay Pillow, MD, MPH  ANESTHESIOLOGIST: Anesthesiologist: Alisa Graff, MD CRNA: Cameron Ali, CRNA   ANESTHESIA:  Topical with tetracaine drops augmented with 1% preservative-free intracameral lidocaine.  ESTIMATED BLOOD LOSS: less than 1 mL.   COMPLICATIONS:  None.   DESCRIPTION OF PROCEDURE:  The patient was identified in the holding room and transported to the operating room and placed in the supine position under the operating microscope.  The right eye was identified as the operative eye and it was prepped and draped in the usual sterile ophthalmic fashion.   A 1.0 millimeter clear-corneal paracentesis was made at the 10:30 position. 0.5 ml of preservative-free 1% lidocaine with epinephrine was injected into the anterior chamber.  The anterior chamber was filled with Healon 5 viscoelastic.  A 2.4 millimeter keratome was used to make a near-clear corneal incision at the 8:00 position.  A curvilinear capsulorrhexis was made with a cystotome and capsulorrhexis forceps.  Balanced salt solution was used to hydrodissect and hydrodelineate the nucleus.   Phacoemulsification was then used in stop and chop fashion to remove the lens nucleus and epinucleus.  The remaining cortex was then removed using the irrigation and  aspiration handpiece. Healon was then placed into the capsular bag to distend it for lens placement.  A lens was then injected into the capsular bag.  The remaining viscoelastic was aspirated.   Wounds were hydrated with balanced salt solution.  The anterior chamber was inflated to a physiologic pressure with balanced salt solution.   Intracameral vigamox 0.1 mL undiluted was injected into the eye and a drop placed onto the ocular surface.  No wound leaks were noted.  The patient was taken to the recovery room in stable condition without complications of anesthesia or surgery  Benay Pillow 04/20/2019, 8:00 AM

## 2019-04-21 ENCOUNTER — Encounter: Payer: Self-pay | Admitting: Ophthalmology

## 2019-04-22 NOTE — Telephone Encounter (Signed)
We should complete an office visit prior to determining how to treat his anxiety. We can discuss further during his visit.

## 2019-04-23 ENCOUNTER — Other Ambulatory Visit: Payer: Self-pay

## 2019-04-23 NOTE — Telephone Encounter (Signed)
Patient notified & aware that this needed to wait until he has in office evaluation.

## 2019-04-24 ENCOUNTER — Other Ambulatory Visit: Payer: Self-pay | Admitting: Family Medicine

## 2019-04-24 DIAGNOSIS — G4700 Insomnia, unspecified: Secondary | ICD-10-CM

## 2019-04-28 ENCOUNTER — Other Ambulatory Visit: Payer: Self-pay

## 2019-04-28 ENCOUNTER — Encounter: Payer: Self-pay | Admitting: Family Medicine

## 2019-04-28 ENCOUNTER — Ambulatory Visit (INDEPENDENT_AMBULATORY_CARE_PROVIDER_SITE_OTHER): Payer: Medicare Other | Admitting: Family Medicine

## 2019-04-28 VITALS — BP 120/70 | HR 89 | Temp 97.9°F | Ht 68.0 in | Wt 199.0 lb

## 2019-04-28 DIAGNOSIS — R251 Tremor, unspecified: Secondary | ICD-10-CM

## 2019-04-28 DIAGNOSIS — F419 Anxiety disorder, unspecified: Secondary | ICD-10-CM

## 2019-04-28 DIAGNOSIS — I208 Other forms of angina pectoris: Secondary | ICD-10-CM | POA: Diagnosis not present

## 2019-04-28 DIAGNOSIS — R0989 Other specified symptoms and signs involving the circulatory and respiratory systems: Secondary | ICD-10-CM

## 2019-04-28 DIAGNOSIS — R42 Dizziness and giddiness: Secondary | ICD-10-CM

## 2019-04-28 DIAGNOSIS — H269 Unspecified cataract: Secondary | ICD-10-CM

## 2019-04-28 MED ORDER — ESCITALOPRAM OXALATE 5 MG PO TABS: 5.0000 mg | ORAL_TABLET | Freq: Every day | ORAL | 2 refills | Status: DC

## 2019-04-28 NOTE — Patient Instructions (Signed)
Nice to see you. We will get the ultrasound of your right arm and start you on Lexapro. Please monitor your dizziness. Please monitor your anxiety with the Lexapro. If you decide on a neurologist please let us know.

## 2019-04-30 DIAGNOSIS — R251 Tremor, unspecified: Secondary | ICD-10-CM | POA: Insufficient documentation

## 2019-04-30 DIAGNOSIS — R0989 Other specified symptoms and signs involving the circulatory and respiratory systems: Secondary | ICD-10-CM | POA: Insufficient documentation

## 2019-04-30 NOTE — Progress Notes (Signed)
Marc Rumps, MD Phone: [phone]  [name] is a 73 y.o. male who presents today for follow-up.  Dizziness: Patient has good days and bad days.  He notes it is a little bit better now that he has had cataract surgery.  He is taking Midodrin half a tablet twice daily.  Tremor: Patient notes tremor right hand greater than left.  He notes occasional neck pain that started after his flu shot and radiates to his elbow at times.  This occurs intermittently.  No numbness.  No weakness.  He notes the tremor started about a month ago.  Notes it often happens when he worries.  No intention tremor.  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  He looks at the worst case scenario and worries about it.  He has been a little more jumpy recently.  He notes anxiety has been worse recently.  He notes minimal depression.  No SI.  He has a family history of anxiety.  Decreased right radial pulse: This was noted previously by his wife.  He notes no claudication symptoms in his arms.  He does note he feels cold all over.  Cataracts: He has had the right one removed by ophthalmology.  He sees them for follow-up later this week.  Social History   Tobacco Use  Smoking Status Former Smoker  . Packs/day: 2.00  . Years: 40.00  . Pack years: 80.00  . Types: Cigarettes  . Quit date: 10/17/1992  . Years since quitting: 26.5  Smokeless Tobacco Never Used  Tobacco Comment   quit 1994     ROS see history of present illness  Objective  Physical Exam Vitals:   04/28/19 1318  BP: 120/70  Pulse: 89  Temp: 97.9 F (36.6 C)  SpO2: 97%    BP Readings from Last 3 Encounters:  04/28/19 120/70  04/20/19 132/75  04/02/19 100/70   Wt Readings from Last 3 Encounters:  04/28/19 199 lb (90.3 kg)  04/20/19 196 lb (88.9 kg)  03/30/19 198 lb (89.8 kg)    Physical Exam Constitutional:      General: He is not in acute distress.    Appearance: He is not diaphoretic.  Cardiovascular:     Rate and Rhythm:  Normal rate and regular rhythm.     Heart sounds: Normal heart sounds.     Comments: 2+ left radial pulse, 1+ right radial pulse, bilateral hands appear warm and well perfused Pulmonary:     Effort: Pulmonary effort is normal.     Breath sounds: Normal breath sounds.  Musculoskeletal:     Right lower leg: No edema.     Left lower leg: No edema.  Skin:    General: Skin is warm and dry.  Neurological:     Mental Status: He is alert.     Comments: CN 3-12 intact, 5/5 strength in bilateral biceps, triceps, grip, quads, hamstrings, plantar and dorsiflexion, sensation to light touch intact in bilateral UE and LE, normal gait, 2+ patellar reflexes, normal finger-to-nose, normal rapid alternating movements, mild tremor noted at rest in the right hand, no cogwheel rigidity      Assessment/Plan: Please see individual problem list.  Cataracts, bilateral Status post surgery.  He is seeing better.  He will continue to follow with ophthalmology.  Dizziness of unknown cause Chronic issue.  Likely multifactorial cause.  Has been a little bit better since having his cataract removed from the right eye.  He will continue to monitor and continue to follow with  cardiology.  Decreased radial pulse Right radial pulses decreased compared to the left.  Hands appear to be warm and well perfused.  He notes no claudication symptoms.  Will obtain right upper extremity arterial study.  Anxiety This is worsened recently.  We will start him on Lexapro.  Tremor Patient with resting tremor.  Discussed concern could be for Parkinson's though he does not appear to have any other typical symptoms.  I did discuss that if it was Parkinson's it could account for the dizziness that he has.  Discussed that he needs to see neurology for further evaluation.  Offered referral back to the neurologist he saw most recently though he wants to check with his daughter in law to see who would be best for him to see.  He will contact  us when he decides he wants to see   Orders Placed This Encounter  Procedures  . [place] UPPER EXTREMITY DUPLEX RIGHT (NON-WBI)    Standing Status:   Future    Standing Expiration Date:   06/27/2020    Order Specific Question:   Reason for Exam (SYMPTOM  OR DIAGNOSIS REQUIRED)    Answer:   decreased radial pulse on the right    Order Specific Question:   Preferred imaging location?    Answer:   [facility]    Meds ordered this encounter  Medications  . escitalopram (LEXAPRO) 5 MG tablet    Sig: Take 1 tablet (5 mg total) by mouth daily.    Dispense:  30 tablet    Refill:  2     [name], MD [facility]

## 2019-04-30 NOTE — Assessment & Plan Note (Signed)
Status post surgery.  He is seeing better.  He will continue to follow with ophthalmology.

## 2019-04-30 NOTE — Assessment & Plan Note (Addendum)
Patient with resting tremor.  Discussed concern could be for Parkinson's though he does not appear to have any other typical symptoms.  I did discuss that if it was Parkinson's it could account for the dizziness that he has.  Discussed that he needs to see neurology for further evaluation.  Offered referral back to the neurologist he saw most recently though he wants to check with his daughter in law to see who would be best for him to see.  He will contact us when he decides he wants to see

## 2019-04-30 NOTE — Assessment & Plan Note (Signed)
Chronic issue.  Likely multifactorial cause.  Has been a little bit better since having his cataract removed from the right eye.  He will continue to monitor and continue to follow with cardiology.

## 2019-04-30 NOTE — Assessment & Plan Note (Signed)
This is worsened recently.  We will start him on Lexapro.

## 2019-04-30 NOTE — Assessment & Plan Note (Signed)
Right radial pulses decreased compared to the left.  Hands appear to be warm and well perfused.  He notes no claudication symptoms.  Will obtain right upper extremity arterial study.

## 2019-05-11 ENCOUNTER — Other Ambulatory Visit: Payer: Self-pay | Admitting: Family Medicine

## 2019-05-11 MED ORDER — ESCITALOPRAM OXALATE 5 MG PO TABS: 5.0000 mg | ORAL_TABLET | Freq: Every day | ORAL | 1 refills | Status: DC

## 2019-05-23 ENCOUNTER — Other Ambulatory Visit: Payer: Self-pay | Admitting: Family Medicine

## 2019-05-23 DIAGNOSIS — G4700 Insomnia, unspecified: Secondary | ICD-10-CM

## 2019-06-03 ENCOUNTER — Ambulatory Visit: Payer: Medicare Other | Admitting: Family Medicine

## 2019-06-08 ENCOUNTER — Other Ambulatory Visit: Payer: Self-pay | Admitting: Family Medicine

## 2019-06-08 DIAGNOSIS — G4700 Insomnia, unspecified: Secondary | ICD-10-CM

## 2019-06-08 NOTE — Telephone Encounter (Signed)
Requested medication (s) are due for refill today: no  Requested medication (s) are on the active medication list: ye  Last refill:  05/26/19  Future visit scheduled: yes  Notes to clinic:  Medication not delegated to NT to refill   Requested Prescriptions  Pending Prescriptions Disp Refills   eszopiclone (LUNESTA) 1 MG TABS tablet 30 tablet 0    Sig: Take immediately before bedtime     Not Delegated - Psychiatry:  Anxiolytics/Hypnotics Failed - 06/08/2019  3:19 PM      Failed - This refill cannot be delegated      Failed - Urine Drug Screen completed in last 360 days.      Passed - Valid encounter within last 6 months    Recent Outpatient Visits          1 month ago Decreased radial pulse   [place] Sonn[place], Angela Adam, MD   2 months ago Left hip pain   [place], DO   4 months ago Cough   [facility] Leone Haven, MD   4 months ago Bilateral hip pain   [place], Olevia Bowens, DO   5 months ago [place] Sonn[place], Angela Adam, MD      Future Appointments            In 1 week Caryl Bis, Angela Adam, MD [facility] [place], [place]   In 7 months O'Brien-Blaney, Bryson Corona, LPN [place], [place]

## 2019-06-08 NOTE — Telephone Encounter (Signed)
Medication: eszopiclone (LUNESTA) 1 MG TABS tablet [[id number]] - 90 day   Has the patient contacted their pharmacy? Yes  (Agent: If no, request that the patient contact the pharmacy for the refill.) (Agent: If yes, when and what did the pharmacy advise?)  Preferred Pharmacy (with phone number or street name): [place], [place] [phone] (Phone) [phone] (Fax)    Agent: Please be advised that RX refills may take up to 3 business days. We ask that you follow-up with your pharmacy.

## 2019-06-10 MED ORDER — ESZOPICLONE 1 MG PO TABS: ORAL_TABLET | ORAL | 0 refills | Status: DC

## 2019-06-15 ENCOUNTER — Telehealth: Payer: Self-pay | Admitting: Family Medicine

## 2019-06-15 NOTE — Telephone Encounter (Signed)
Pt's wife called back in regards to this refill, states that they have the approval letter for refills until they can establish with a new provider in [facility]. Please advise pt needs refill.

## 2019-06-16 ENCOUNTER — Telehealth: Payer: Self-pay | Admitting: Family Medicine

## 2019-06-16 ENCOUNTER — Ambulatory Visit: Payer: Medicare Other | Admitting: Family Medicine

## 2019-06-16 NOTE — Telephone Encounter (Signed)
Medication Refill - Medication: escitalopram (LEXAPRO) 5 MG tablet [[id number]]     Preferred Pharmacy (with phone number or street name):  [id number] - [place], [place] - [address]  [address] [address]  Phone: [phone] Fax: [phone]     Agent: Please be advised that RX refills may take up to 3 business days. We ask that you follow-up with your pharmacy.

## 2019-06-17 MED ORDER — ESCITALOPRAM OXALATE 5 MG PO TABS: 5.0000 mg | ORAL_TABLET | Freq: Every day | ORAL | 1 refills | Status: DC

## 2019-06-17 NOTE — Telephone Encounter (Signed)
Sent to pharmacy 

## 2019-06-24 ENCOUNTER — Telehealth: Payer: Self-pay

## 2019-06-24 ENCOUNTER — Telehealth: Payer: Self-pay | Admitting: Family Medicine

## 2019-06-24 DIAGNOSIS — G4700 Insomnia, unspecified: Secondary | ICD-10-CM

## 2019-06-24 NOTE — Telephone Encounter (Signed)
He is not due for a refill yet. It was filled on 06/10/19. Please also find out when he is establishing care with a physician in [place]. Thanks.

## 2019-06-24 NOTE — Telephone Encounter (Signed)
rx refill eszopiclone (LUNESTA) 1 MG TABS tablet  PHARMACY [id number] [name], [place]  Patient is requesting a 90 day supply.

## 2019-06-25 ENCOUNTER — Telehealth: Payer: Self-pay | Admitting: Family Medicine

## 2019-06-25 NOTE — Telephone Encounter (Signed)
PCP has been removed from the patient's chart.

## 2019-06-25 NOTE — Telephone Encounter (Signed)
error 

## 2019-06-25 NOTE — Telephone Encounter (Signed)
-----   Message from Salvatore Marvel sent at 06/25/2019  9:30 AM EST ----- Regarding: remove pcp Pt has moved to [place]. Please remove ES as PCP in Epic.  Thank you

## 2019-06-29 ENCOUNTER — Other Ambulatory Visit: Payer: Self-pay

## 2019-06-29 DIAGNOSIS — G4700 Insomnia, unspecified: Secondary | ICD-10-CM

## 2019-06-29 NOTE — Telephone Encounter (Signed)
Pts wife called and is needing this medication sent to the new pharmacy. Pts wife states pt is out of the medication completely. Please advise.     [id number] - [place], [place] - [address]  [address] [place] [address]  Phone: [phone] Fax: [phone]  Not a 24 hour pharmacy; exact hours not known.

## 2019-06-30 NOTE — Telephone Encounter (Signed)
Given that this is a controlled substance I will only refill for 30 days at a time.  Please check to see when he is establishing with a new primary care provider in [place].  Thanks.

## 2019-07-01 NOTE — Telephone Encounter (Signed)
I called and spoke with the patient and I informed him that the medication requested Lunesta was to soon to be filled, it would be filled on 12/25 and this medication has to be refilled every 30 days, he stated he lost the prior prescription. He stated he is willing to wait and let the provider fill it then, I asked him about if he had found a provider in [facility] and he stated yes but his appointment is not until January.  Anothony Bursch,cma

## 2019-07-01 NOTE — Telephone Encounter (Signed)
Refill request for this medication sent to provider.  Lyle Leisner,cma

## 2019-07-02 MED ORDER — ESZOPICLONE 1 MG PO TABS: ORAL_TABLET | ORAL | 0 refills | Status: AC

## 2019-07-02 NOTE — Telephone Encounter (Signed)
I called uinformed the patient the medication was sent to pharmacy.  Bentley Fissel,cma

## 2019-07-02 NOTE — Telephone Encounter (Signed)
I have refilled x1. He should get further refills from his PCP in [facility] once he establishes with them.

## 2019-07-02 NOTE — Addendum Note (Signed)
Addended by: Caryl Bis Jazma Pickel G on: 07/02/2019 01:47 PM   Modules accepted: Orders

## 2019-07-31 ENCOUNTER — Telehealth: Payer: Self-pay

## 2019-07-31 NOTE — Telephone Encounter (Signed)
He should be establishing with a new provider in [place]. He can discuss this further with them.

## 2019-08-29 ENCOUNTER — Other Ambulatory Visit: Payer: Self-pay | Admitting: Family Medicine

## 2019-09-04 DIAGNOSIS — E785 Hyperlipidemia, unspecified: Secondary | ICD-10-CM | POA: Diagnosis not present

## 2019-09-04 DIAGNOSIS — F329 Major depressive disorder, single episode, unspecified: Secondary | ICD-10-CM | POA: Diagnosis not present

## 2019-09-04 DIAGNOSIS — I2510 Atherosclerotic heart disease of native coronary artery without angina pectoris: Secondary | ICD-10-CM | POA: Diagnosis not present

## 2019-09-04 DIAGNOSIS — R251 Tremor, unspecified: Secondary | ICD-10-CM | POA: Diagnosis not present

## 2019-09-04 DIAGNOSIS — F419 Anxiety disorder, unspecified: Secondary | ICD-10-CM | POA: Diagnosis not present

## 2019-09-04 DIAGNOSIS — R42 Dizziness and giddiness: Secondary | ICD-10-CM | POA: Diagnosis not present

## 2019-09-04 DIAGNOSIS — N400 Enlarged prostate without lower urinary tract symptoms: Secondary | ICD-10-CM | POA: Diagnosis not present

## 2019-09-04 DIAGNOSIS — Z1211 Encounter for screening for malignant neoplasm of colon: Secondary | ICD-10-CM | POA: Diagnosis not present

## 2019-09-04 DIAGNOSIS — K224 Dyskinesia of esophagus: Secondary | ICD-10-CM | POA: Diagnosis not present

## 2019-09-04 DIAGNOSIS — Z1212 Encounter for screening for malignant neoplasm of rectum: Secondary | ICD-10-CM | POA: Diagnosis not present

## 2019-09-04 DIAGNOSIS — R519 Headache, unspecified: Secondary | ICD-10-CM | POA: Diagnosis not present

## 2019-09-04 DIAGNOSIS — I7409 Other arterial embolism and thrombosis of abdominal aorta: Secondary | ICD-10-CM | POA: Diagnosis not present

## 2019-09-07 DIAGNOSIS — Z1211 Encounter for screening for malignant neoplasm of colon: Secondary | ICD-10-CM | POA: Diagnosis not present

## 2019-09-07 DIAGNOSIS — R519 Headache, unspecified: Secondary | ICD-10-CM | POA: Diagnosis not present

## 2019-09-07 DIAGNOSIS — Z1212 Encounter for screening for malignant neoplasm of rectum: Secondary | ICD-10-CM | POA: Diagnosis not present

## 2019-09-07 DIAGNOSIS — R739 Hyperglycemia, unspecified: Secondary | ICD-10-CM | POA: Diagnosis not present

## 2019-09-07 DIAGNOSIS — R42 Dizziness and giddiness: Secondary | ICD-10-CM | POA: Diagnosis not present

## 2019-09-07 DIAGNOSIS — E785 Hyperlipidemia, unspecified: Secondary | ICD-10-CM | POA: Diagnosis not present

## 2019-09-10 DIAGNOSIS — R519 Headache, unspecified: Secondary | ICD-10-CM | POA: Diagnosis not present

## 2019-09-11 DIAGNOSIS — Z1212 Encounter for screening for malignant neoplasm of rectum: Secondary | ICD-10-CM | POA: Diagnosis not present

## 2019-09-11 DIAGNOSIS — Z1211 Encounter for screening for malignant neoplasm of colon: Secondary | ICD-10-CM | POA: Diagnosis not present

## 2019-09-15 DIAGNOSIS — R5383 Other fatigue: Secondary | ICD-10-CM | POA: Diagnosis not present

## 2019-09-15 DIAGNOSIS — N400 Enlarged prostate without lower urinary tract symptoms: Secondary | ICD-10-CM | POA: Diagnosis not present

## 2019-09-18 DIAGNOSIS — M25559 Pain in unspecified hip: Secondary | ICD-10-CM | POA: Diagnosis not present

## 2019-09-18 DIAGNOSIS — R21 Rash and other nonspecific skin eruption: Secondary | ICD-10-CM | POA: Diagnosis not present

## 2019-09-22 DIAGNOSIS — E291 Testicular hypofunction: Secondary | ICD-10-CM | POA: Diagnosis not present

## 2019-09-30 DIAGNOSIS — M160 Bilateral primary osteoarthritis of hip: Secondary | ICD-10-CM | POA: Diagnosis not present

## 2019-09-30 DIAGNOSIS — M545 Low back pain: Secondary | ICD-10-CM | POA: Diagnosis not present

## 2019-09-30 DIAGNOSIS — M170 Bilateral primary osteoarthritis of knee: Secondary | ICD-10-CM | POA: Diagnosis not present

## 2019-10-05 DIAGNOSIS — M5416 Radiculopathy, lumbar region: Secondary | ICD-10-CM | POA: Diagnosis not present

## 2019-10-09 DIAGNOSIS — M47816 Spondylosis without myelopathy or radiculopathy, lumbar region: Secondary | ICD-10-CM | POA: Diagnosis not present

## 2019-10-13 DIAGNOSIS — E291 Testicular hypofunction: Secondary | ICD-10-CM | POA: Diagnosis not present

## 2019-10-19 ENCOUNTER — Telehealth: Payer: Self-pay

## 2019-10-19 DIAGNOSIS — M48062 Spinal stenosis, lumbar region with neurogenic claudication: Secondary | ICD-10-CM | POA: Diagnosis not present

## 2019-10-19 NOTE — Telephone Encounter (Signed)
   Primary Cardiologist: Bryan Lemma, MD  Chart reviewed as part of pre-operative protocol coverage.   Per prior recommendations by Dr. [name] and given lack of interval change in cardiac history since that time, patient can hold plavix 5-7 days prior to her upcoming spinal injections as long as she does not develop new anginal symptoms. Plavix should be restarted when cleared to do so by Dr. Connye Burkitt.   I will route this recommendation to the requesting party via Epic fax function and remove from pre-op pool.  Please call with questions.  Beatriz Stallion, PA-C 10/19/2019, 4:38 PM

## 2019-10-19 NOTE — Telephone Encounter (Signed)
   [facility] Pre-operative Risk Assessment    Request for surgical clearance:  1. What type of surgery is being performed? Series of Epidural Steroid injection (up to 3)  2. When is this surgery scheduled? TBD   3. What type of clearance is required (medical clearance vs. Pharmacy clearance to hold med vs. Both)? Pharmacy  4. Are there any medications that need to be held prior to surgery and how long? Palvix, for 7 days prior to each injection    5. Practice name and name of physician performing surgery? [facility], Romie Levee, MD    6. What is your office phone number? [phone]    7.   What is your office fax number? [phone]  8.   Anesthesia type (None, local, MAC, general) ?    Jacqulynn Cadet 10/19/2019, 4:11 PM  _________________________________________________________________   (provider comments below)

## 2019-10-28 DIAGNOSIS — M5416 Radiculopathy, lumbar region: Secondary | ICD-10-CM | POA: Diagnosis not present

## 2019-12-24 ENCOUNTER — Other Ambulatory Visit: Payer: Self-pay | Admitting: Family Medicine

## 2020-01-25 ENCOUNTER — Ambulatory Visit: Payer: Medicare Other

## 2020-03-23 ENCOUNTER — Telehealth (INDEPENDENT_AMBULATORY_CARE_PROVIDER_SITE_OTHER): Payer: Self-pay

## 2020-03-23 NOTE — Telephone Encounter (Signed)
Cancelled remaining appts

## 2020-03-23 NOTE — Telephone Encounter (Signed)
The pt called and left a message on the nurses line that he got a call from the automated system that he has an appointment here in our office on the 9/10 he would like to make [place] aware that he now lives in [facility] and will no longer need our services and to please cancel  Any and all appointments here.

## 2020-03-25 ENCOUNTER — Encounter (INDEPENDENT_AMBULATORY_CARE_PROVIDER_SITE_OTHER): Payer: Medicare Other

## 2020-03-25 ENCOUNTER — Ambulatory Visit (INDEPENDENT_AMBULATORY_CARE_PROVIDER_SITE_OTHER): Payer: Medicare Other | Admitting: Nurse Practitioner

## 2020-05-24 ENCOUNTER — Telehealth: Payer: Self-pay

## 2020-05-24 NOTE — Telephone Encounter (Signed)
   [facility] Pre-operative Risk Assessment    Request for surgical clearance:  1. What type of surgery is being performed? ESI-UP TO 3 INJECTIONS   2. When is this surgery scheduled? TBD   3. What type of clearance is required (medical clearance vs. Pharmacy clearance to hold med vs. Both)? MEDICATION  4. Are there any medications that need to be held prior to surgery and how long? PLAVIX 7 DAY PRIOR TO EACH OF THE INJECTIONS   5. Practice name and name of physician performing surgery? [facility] MATTHEW DAVIES, MD   6. What is the office phone number? [phone]   7.   What is the office fax number? [phone]  8.   Anesthesia type (None, local, MAC, general) ? Not listed-[facility]-OFFICE CLOSED _0

## 2020-05-25 NOTE — Telephone Encounter (Signed)
   Primary Cardiologist: Bryan Lemma, MD  Chart reviewed as part of pre-operative protocol coverage.   Per previous recommendation by Dr. [name] and given lack of interval change in cardiac history since that time, patient can hold plavix 5-7 days prior to his upcoming spinal injection with plans to restart as soon as he is cleared to do so by his surgeon.  I will route this recommendation to the requesting party via Epic fax function and remove from pre-op pool.  Please call with questions.  Beatriz Stallion, PA-C 05/25/2020, 8:36 AM

## 2020-09-13 IMAGING — CT CT L SPINE W/ CM
1 of 7 series · 6 of 14 positions shown, 8 images · non-contrast
Comparison: CT of the abdomen and pelvis[REDACTED]

CLINICAL DATA: Back pain extending into the lower extremities
bilaterally, left greater than right.
TECHNIQUE: Contiguous axial images were obtained through the Lumbar spine after
the intrathecal infusion of infusion. Coronal and sagittal
reconstructions were obtained of the axial image sets.

[Series 3: l spine soft · axial · 0.34mm/px · z∈[-282,-102]mm · 6 of 85 slices shown, 8 images]
[im 13/85  soft-tissue]
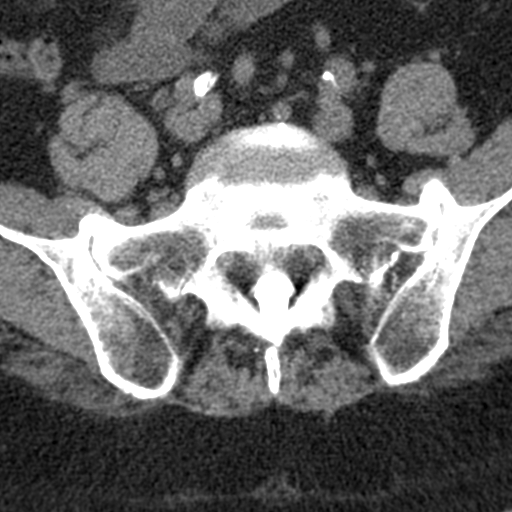
[im 13/85  bone]
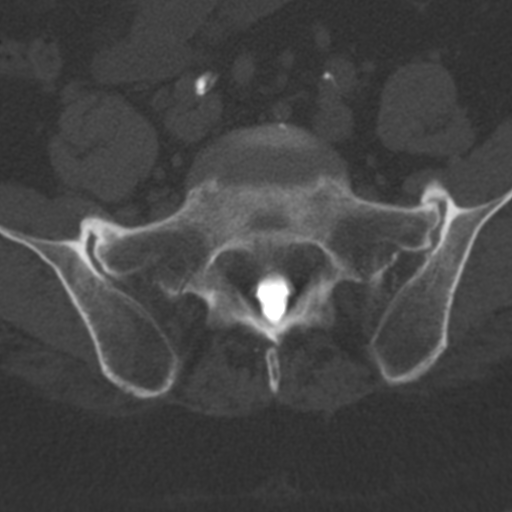
[im 25/85  bone]
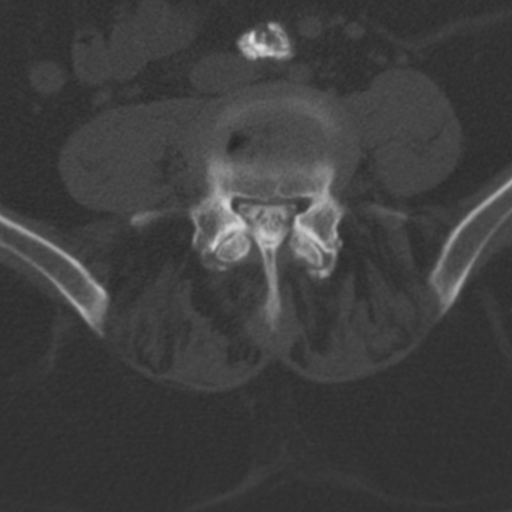
[im 37/85  bone]
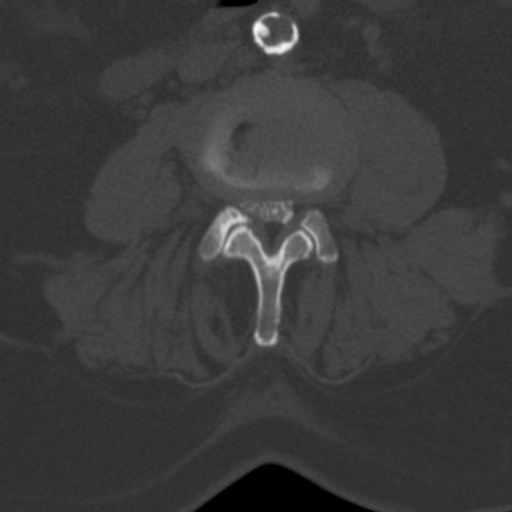
[im 49/85  bone]
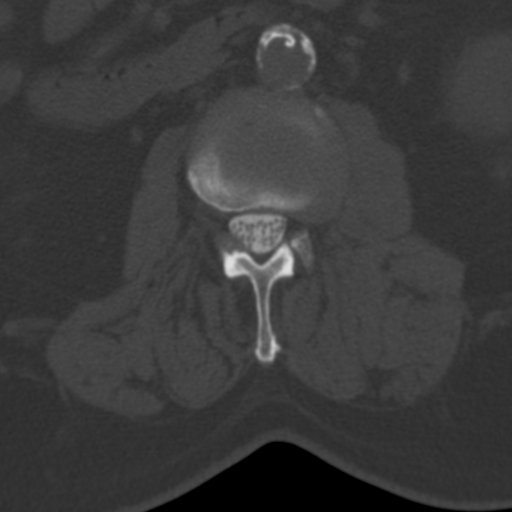
[im 61/85  soft-tissue]
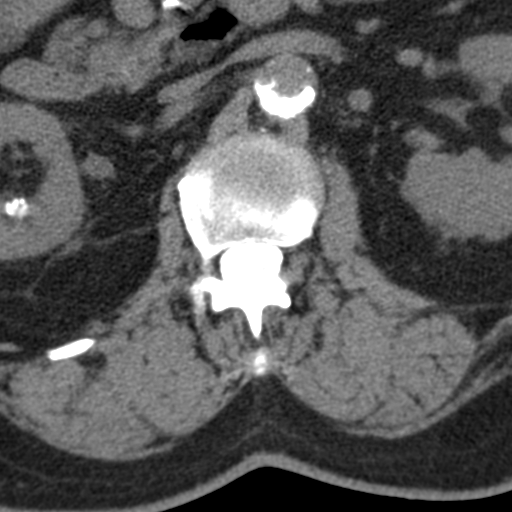
[im 61/85  bone]
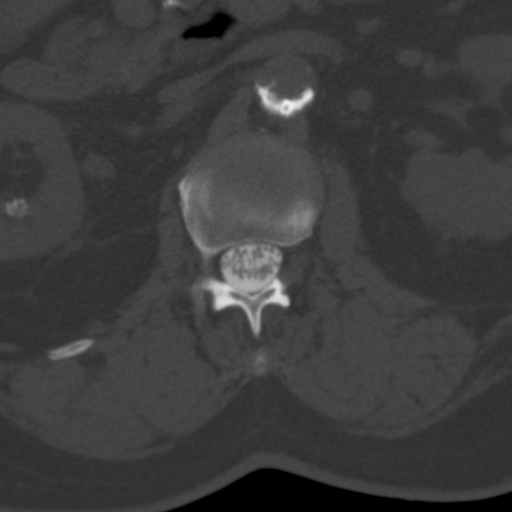
[im 73/85  bone]
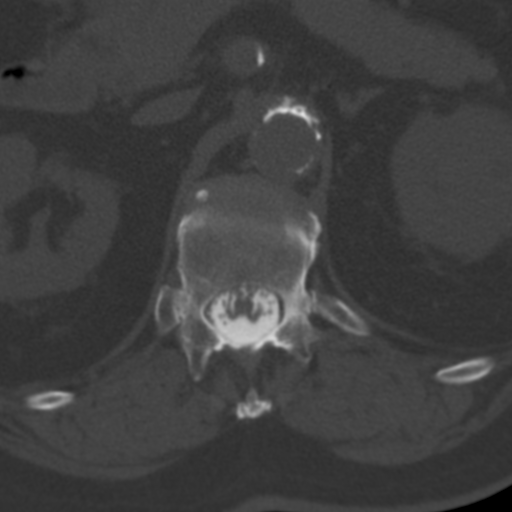

[6 of 14 positions shown; findings below may reference images not displayed]

EXAM:
LUMBAR MYELOGRAM

FLUOROSCOPY TIME:  Radiation Exposure Index (as provided by the
fluoroscopic device): 278.82 uGy*m2

Fluoroscopy Time:  27 seconds

Number of Acquired Images:  16

PROCEDURE:
After thorough discussion of risks and benefits of the procedure
including bleeding, infection, injury to nerves, blood vessels,
adjacent structures as well as headache and CSF leak, written and
oral informed consent was obtained. Consent was obtained by Dr.
Fallon Jim. Time out form was completed.

Patient was positioned prone on the fluoroscopy table. Local
anesthesia was provided with 1% lidocaine without epinephrine after
prepped and draped in the usual sterile fashion. Puncture was
performed at L5-S1 using a 3 1/2 inch 22-gauge spinal needle via
right paramedian approach. Using a single pass through the dura, the
needle was placed within the thecal sac, with return of clear CSF.
15 mL of Isovue 4-4OO was injected into the thecal sac, with normal
opacification of the nerve roots and cauda equina consistent with
free flow within the subarachnoid space.

I personally performed the lumbar puncture and administered the
intrathecal contrast. I also personally supervised acquisition of
the myelogram images.
FINDINGS: LUMBAR MYELOGRAM FINDINGS:

Moderate central canal stenosis is present at L4-5 with right
greater than left subarticular narrowing. There is mild right
subarticular narrowing at L3-4. Disc bulging is present at L2-3
without significant stenosis. S1 nerve roots fill normally on both
sides.

AP alignment is anatomic.

The disc protrusion at L4-5 is worse with standing. Stenosis is
greatest in extension. Disc disease at L2-3 and L3-4 does not change
significantly with standing.

Extensive atherosclerotic changes are present throughout the
abdominal aorta branch vessels without aneurysm.

CT LUMBAR MYELOGRAM FINDINGS:

Lumbar spine is imaged from T11 through S3. AP alignment is
anatomic. Mild leftward curvature is centered at L3-4. Vertebral
body heights are normal.

Atherosclerotic calcifications present throughout the distal aorta.
There is extensive calcific plaque in the proximal iliac arteries
bilaterally. No aneurysm is present.

No significant adenopathy is present. Bilateral nonobstructive
nephrolithiasis is noted. Limited imaging of the abdomen is
otherwise unremarkable.

T12-L1: Negative.

L1-2: No significant disc protrusion or stenosis is present.

L2-3: A far left lateral disc protrusion is present. This extends
into the left neural foramen. Mild left foraminal narrowing results.
Central canal is patent. Right foramen is patent.

L3-4: A broad-based disc protrusion is present. Facet hypertrophy
and ligamentum flavum thickening is worse on the right. This results
in mild right subarticular narrowing. Mild foraminal narrowing is
present bilaterally.

L4-5: A broad-based disc protrusion is present. Advanced facet
hypertrophy and ligamentum flavum thickening is noted. This results
in moderate subarticular stenosis bilaterally, right greater than
left. Moderate right and mild left foraminal stenosis is present.

L5-S1: Mild facet spurring is worse on the right. There is no focal
disc protrusion or stenosis.
IMPRESSION: 1. Moderate central canal stenosis with right greater than left
subarticular narrowing due to a broad-based disc protrusion and
advanced facet hypertrophy. Stenosis is worse with standing.
2. No abnormal motion with flexion or extension.
3. Moderate right and mild left foraminal stenosis at L4-5.
4. Mild right subarticular and bilateral foraminal stenosis at L3-4
due to a broad-based disc protrusion and bilateral facet disease.
5. Mild left foraminal stenosis at L2-3 due to a far left lateral
disc protrusion.
6. Mild facet spurring at L5-S1 is worse on the right. There is no
focal disc protrusion or stenosis.

## 2022-05-14 ENCOUNTER — Encounter (INDEPENDENT_AMBULATORY_CARE_PROVIDER_SITE_OTHER): Payer: Self-pay

## 2023-09-25 ENCOUNTER — Telehealth: Payer: Self-pay | Admitting: Family Medicine

## 2023-09-25 NOTE — Telephone Encounter (Signed)
 Dr Birdie Sons is no longer at this location. Please call the office to schedule a Transfer of Care to either Dr Charlann Lange, Darleen Crocker or Kara Dies, NP. [id number] please schedule TOC for this patient.   Thank you
